# Patient Record
Sex: Female | Born: 1950
Health system: Southern US, Community
[De-identification: ages and names within clinical notes are randomized; demographics above are authoritative.]

## PROBLEM LIST (undated history)

## (undated) DIAGNOSIS — D219 Benign neoplasm of connective and other soft tissue, unspecified: Secondary | ICD-10-CM

## (undated) DIAGNOSIS — N289 Disorder of kidney and ureter, unspecified: Secondary | ICD-10-CM

## (undated) DIAGNOSIS — I509 Heart failure, unspecified: Secondary | ICD-10-CM

## (undated) DIAGNOSIS — K219 Gastro-esophageal reflux disease without esophagitis: Secondary | ICD-10-CM

## (undated) DIAGNOSIS — D649 Anemia, unspecified: Secondary | ICD-10-CM

## (undated) DIAGNOSIS — M329 Systemic lupus erythematosus, unspecified: Secondary | ICD-10-CM

## (undated) DIAGNOSIS — I10 Essential (primary) hypertension: Secondary | ICD-10-CM

## (undated) DIAGNOSIS — M1990 Unspecified osteoarthritis, unspecified site: Secondary | ICD-10-CM

## (undated) DIAGNOSIS — I219 Acute myocardial infarction, unspecified: Secondary | ICD-10-CM

## (undated) DIAGNOSIS — K635 Polyp of colon: Secondary | ICD-10-CM

## (undated) DIAGNOSIS — J189 Pneumonia, unspecified organism: Secondary | ICD-10-CM

## (undated) HISTORY — DX: Polyp of colon: K63.5

## (undated) HISTORY — PX: TONSILLECTOMY: SUR1361

## (undated) HISTORY — DX: Acute myocardial infarction, unspecified: I21.9

---

## 2000-04-16 ENCOUNTER — Emergency Department (HOSPITAL_COMMUNITY): Admission: EM | Admit: 2000-04-16 | Discharge: 2000-04-16 | Payer: Self-pay | Admitting: *Deleted

## 2000-04-22 ENCOUNTER — Other Ambulatory Visit: Admission: RE | Admit: 2000-04-22 | Discharge: 2000-04-22 | Payer: Self-pay | Admitting: Gynecology

## 2000-08-12 ENCOUNTER — Ambulatory Visit: Admission: RE | Admit: 2000-08-12 | Discharge: 2000-08-12 | Payer: Self-pay | Admitting: Gynecology

## 2000-08-31 ENCOUNTER — Encounter: Admission: RE | Admit: 2000-08-31 | Discharge: 2000-08-31 | Payer: Self-pay | Admitting: *Deleted

## 2000-10-27 ENCOUNTER — Inpatient Hospital Stay (HOSPITAL_COMMUNITY): Admission: RE | Admit: 2000-10-27 | Discharge: 2000-10-28 | Payer: Self-pay | Admitting: Gynecology

## 2000-10-27 HISTORY — PX: ABDOMINAL HYSTERECTOMY: SHX81

## 2002-11-30 ENCOUNTER — Emergency Department (HOSPITAL_COMMUNITY): Admission: EM | Admit: 2002-11-30 | Discharge: 2002-11-30 | Payer: Self-pay | Admitting: Emergency Medicine

## 2002-12-03 ENCOUNTER — Encounter: Payer: Self-pay | Admitting: Emergency Medicine

## 2002-12-03 ENCOUNTER — Inpatient Hospital Stay (HOSPITAL_COMMUNITY): Admission: EM | Admit: 2002-12-03 | Discharge: 2002-12-08 | Payer: Self-pay | Admitting: Emergency Medicine

## 2002-12-04 ENCOUNTER — Encounter: Payer: Self-pay | Admitting: Internal Medicine

## 2002-12-08 ENCOUNTER — Encounter: Payer: Self-pay | Admitting: Internal Medicine

## 2002-12-15 ENCOUNTER — Encounter: Payer: Self-pay | Admitting: Internal Medicine

## 2002-12-15 ENCOUNTER — Encounter: Admission: RE | Admit: 2002-12-15 | Discharge: 2002-12-15 | Payer: Self-pay | Admitting: Internal Medicine

## 2002-12-15 ENCOUNTER — Ambulatory Visit (HOSPITAL_COMMUNITY): Admission: RE | Admit: 2002-12-15 | Discharge: 2002-12-15 | Payer: Self-pay | Admitting: Internal Medicine

## 2010-08-03 ENCOUNTER — Emergency Department (HOSPITAL_COMMUNITY): Payer: Self-pay

## 2010-08-03 ENCOUNTER — Inpatient Hospital Stay (HOSPITAL_COMMUNITY)
Admission: EM | Admit: 2010-08-03 | Discharge: 2010-08-08 | DRG: 292 | Disposition: A | Discharge status: Home / Self Care | Payer: Self-pay | Attending: Internal Medicine | Admitting: Internal Medicine

## 2010-08-03 DIAGNOSIS — I509 Heart failure, unspecified: Secondary | ICD-10-CM | POA: Diagnosis present

## 2010-08-03 DIAGNOSIS — I4729 Other ventricular tachycardia: Secondary | ICD-10-CM | POA: Diagnosis not present

## 2010-08-03 DIAGNOSIS — M109 Gout, unspecified: Secondary | ICD-10-CM | POA: Diagnosis present

## 2010-08-03 DIAGNOSIS — Z9119 Patient's noncompliance with other medical treatment and regimen: Secondary | ICD-10-CM

## 2010-08-03 DIAGNOSIS — N179 Acute kidney failure, unspecified: Secondary | ICD-10-CM | POA: Diagnosis present

## 2010-08-03 DIAGNOSIS — Z91199 Patient's noncompliance with other medical treatment and regimen due to unspecified reason: Secondary | ICD-10-CM

## 2010-08-03 DIAGNOSIS — M329 Systemic lupus erythematosus, unspecified: Secondary | ICD-10-CM | POA: Diagnosis present

## 2010-08-03 DIAGNOSIS — I2789 Other specified pulmonary heart diseases: Secondary | ICD-10-CM | POA: Diagnosis present

## 2010-08-03 DIAGNOSIS — I129 Hypertensive chronic kidney disease with stage 1 through stage 4 chronic kidney disease, or unspecified chronic kidney disease: Secondary | ICD-10-CM | POA: Diagnosis present

## 2010-08-03 DIAGNOSIS — N183 Chronic kidney disease, stage 3 unspecified: Secondary | ICD-10-CM | POA: Diagnosis present

## 2010-08-03 DIAGNOSIS — I5031 Acute diastolic (congestive) heart failure: Principal | ICD-10-CM | POA: Diagnosis present

## 2010-08-03 DIAGNOSIS — I472 Ventricular tachycardia, unspecified: Secondary | ICD-10-CM | POA: Diagnosis not present

## 2010-08-03 LAB — COMPREHENSIVE METABOLIC PANEL
CO2: 23 mEq/L (ref 19–32)
Calcium: 8.9 mg/dL (ref 8.4–10.5)
Creatinine, Ser: 2.37 mg/dL — ABNORMAL HIGH (ref 0.4–1.2)
GFR calc non Af Amer: 21 mL/min — ABNORMAL LOW (ref >60)
Glucose, Bld: 131 mg/dL — ABNORMAL HIGH (ref 70–99)

## 2010-08-03 LAB — OCCULT BLOOD, POC DEVICE: Fecal Occult Bld: NEGATIVE

## 2010-08-03 LAB — BRAIN NATRIURETIC PEPTIDE: Pro B Natriuretic peptide (BNP): 1740 pg/mL — ABNORMAL HIGH (ref 0.0–100.0)

## 2010-08-03 LAB — CBC
HCT: 40 % (ref 36.0–46.0)
Hemoglobin: 12.5 g/dL (ref 12.0–15.0)
MCH: 26 pg (ref 26.0–34.0)
MCHC: 31.3 g/dL (ref 30.0–36.0)

## 2010-08-03 LAB — DIFFERENTIAL
Basophils Relative: 1 % (ref 0–1)
Eosinophils Relative: 1 % (ref 0–5)
Lymphocytes Relative: 16 % (ref 12–46)
Monocytes Absolute: 0.6 10*3/uL (ref 0.1–1.0)
Monocytes Relative: 6 % (ref 3–12)
Neutro Abs: 7.2 10*3/uL (ref 1.7–7.7)

## 2010-08-03 LAB — APTT: aPTT: 29 seconds (ref 24–37)

## 2010-08-03 LAB — LIPASE, BLOOD: Lipase: 22 U/L (ref 11–59)

## 2010-08-04 ENCOUNTER — Inpatient Hospital Stay (HOSPITAL_COMMUNITY): Payer: Self-pay

## 2010-08-04 DIAGNOSIS — I509 Heart failure, unspecified: Secondary | ICD-10-CM

## 2010-08-04 DIAGNOSIS — M7989 Other specified soft tissue disorders: Secondary | ICD-10-CM

## 2010-08-04 LAB — URINE MICROSCOPIC-ADD ON

## 2010-08-04 LAB — LIPID PANEL
Cholesterol: 127 mg/dL (ref 0–200)
HDL: 33 mg/dL — ABNORMAL LOW (ref >39)
Triglycerides: 63 mg/dL (ref <150)

## 2010-08-04 LAB — URINALYSIS, ROUTINE W REFLEX MICROSCOPIC
Glucose, UA: NEGATIVE mg/dL
Ketones, ur: NEGATIVE mg/dL
pH: 5.5 (ref 5.0–8.0)

## 2010-08-04 LAB — CBC
Hemoglobin: 11.1 g/dL — ABNORMAL LOW (ref 12.0–15.0)
MCH: 25.2 pg — ABNORMAL LOW (ref 26.0–34.0)
MCV: 84.1 fL (ref 78.0–100.0)
RBC: 4.41 MIL/uL (ref 3.87–5.11)

## 2010-08-04 LAB — BASIC METABOLIC PANEL
CO2: 30 mEq/L (ref 19–32)
Chloride: 107 mEq/L (ref 96–112)
Glucose, Bld: 88 mg/dL (ref 70–99)
Potassium: 3.7 mEq/L (ref 3.5–5.1)
Sodium: 144 mEq/L (ref 135–145)

## 2010-08-04 LAB — TROPONIN I: Troponin I: 0.07 ng/mL — ABNORMAL HIGH (ref 0.00–0.06)

## 2010-08-04 LAB — CK TOTAL AND CKMB (NOT AT ARMC)
Relative Index: INVALID (ref 0.0–2.5)
Total CK: 65 U/L (ref 7–177)

## 2010-08-04 MED ORDER — TECHNETIUM TO 99M ALBUMIN AGGREGATED
4.6000 | Freq: Once | INTRAVENOUS | Status: AC | PRN
  Administered 2010-08-04: 4.6 via INTRAVENOUS

## 2010-08-04 MED ORDER — XENON XE 133 GAS
6.7000 | GAS_FOR_INHALATION | Freq: Once | RESPIRATORY_TRACT | Status: AC | PRN
  Administered 2010-08-04: 6.7 via RESPIRATORY_TRACT

## 2010-08-05 LAB — URINE CULTURE
Colony Count: NO GROWTH
Culture  Setup Time: 201203260900
Special Requests: NEGATIVE

## 2010-08-05 LAB — BASIC METABOLIC PANEL
BUN: 20 mg/dL (ref 6–23)
Chloride: 104 mEq/L (ref 96–112)
Creatinine, Ser: 2.53 mg/dL — ABNORMAL HIGH (ref 0.4–1.2)

## 2010-08-05 LAB — CARDIAC PANEL(CRET KIN+CKTOT+MB+TROPI)
CK, MB: 1.3 ng/mL (ref 0.3–4.0)
Relative Index: INVALID (ref 0.0–2.5)
Total CK: 52 U/L (ref 7–177)

## 2010-08-05 LAB — JO-1 ANTIBODY-IGG: Jo-1 Antibody, IgG: 2 AU/mL (ref <30)

## 2010-08-05 LAB — MAGNESIUM: Magnesium: 2.2 mg/dL (ref 1.5–2.5)

## 2010-08-05 LAB — EXTRACTABLE NUCLEAR ANTIGEN ANTIBODY
SSB (La) (ENA) Antibody, IgG: <1 AU/mL (ref <30)
Sm/rnp: 2 AU/mL (ref <30)
ds DNA Ab: 138 IU/mL — ABNORMAL HIGH (ref <30)

## 2010-08-05 LAB — POCT CARDIAC MARKERS: Troponin i, poc: <0.05 ng/mL (ref 0.00–0.09)

## 2010-08-05 LAB — BRAIN NATRIURETIC PEPTIDE: Pro B Natriuretic peptide (BNP): 846 pg/mL — ABNORMAL HIGH (ref 0.0–100.0)

## 2010-08-06 DIAGNOSIS — I5031 Acute diastolic (congestive) heart failure: Secondary | ICD-10-CM

## 2010-08-06 LAB — BASIC METABOLIC PANEL
Calcium: 8.8 mg/dL (ref 8.4–10.5)
GFR calc Af Amer: 25 mL/min — ABNORMAL LOW (ref >60)
GFR calc non Af Amer: 21 mL/min — ABNORMAL LOW (ref >60)
Sodium: 142 mEq/L (ref 135–145)

## 2010-08-06 LAB — COMPLEMENT, TOTAL: Compl, Total (CH50): >60 U/mL — ABNORMAL HIGH (ref 31–60)

## 2010-08-06 LAB — BRAIN NATRIURETIC PEPTIDE: Pro B Natriuretic peptide (BNP): 491 pg/mL — ABNORMAL HIGH (ref 0.0–100.0)

## 2010-08-07 LAB — BASIC METABOLIC PANEL
BUN: 17 mg/dL (ref 6–23)
Calcium: 9 mg/dL (ref 8.4–10.5)
Creatinine, Ser: 2.04 mg/dL — ABNORMAL HIGH (ref 0.4–1.2)
GFR calc Af Amer: 30 mL/min — ABNORMAL LOW (ref >60)

## 2010-08-07 LAB — BRAIN NATRIURETIC PEPTIDE: Pro B Natriuretic peptide (BNP): 698 pg/mL — ABNORMAL HIGH (ref 0.0–100.0)

## 2010-08-08 LAB — BRAIN NATRIURETIC PEPTIDE: Pro B Natriuretic peptide (BNP): 596 pg/mL — ABNORMAL HIGH (ref 0.0–100.0)

## 2010-08-08 LAB — BASIC METABOLIC PANEL
BUN: 15 mg/dL (ref 6–23)
Calcium: 9.2 mg/dL (ref 8.4–10.5)
Creatinine, Ser: 1.9 mg/dL — ABNORMAL HIGH (ref 0.4–1.2)
GFR calc Af Amer: 33 mL/min — ABNORMAL LOW (ref >60)

## 2010-08-11 NOTE — H&P (Signed)
NAMEAMELIAH, [name], MD       DATE OF BIRTH:  1951/04/04  DATE OF ADMISSION:  08/03/2010 DATE OF DISCHARGE:                             HISTORY & PHYSICAL   PRIMARY CARE PHYSICIAN:  Unassigned.  The patient is followed by the [facility], but does not have 1 physician she usually sees.  CHIEF COMPLAINT:  Shortness of breath.  HISTORY OF PRESENT ILLNESS:  A 60 year old [place] female with a history of lupus, in remission for 20 years.  She also has a history of hypertension and had been doing reasonably well until she began noticing abdominal swelling, dyspnea on exertion, shortness of breath, orthopnea, and PND.  These symptoms have been going on for about 2 months, but had gotten much worse over the last few weeks and today she just could not take it no more, started having some abdominal pain because of the distention.  She denied any chest pain, any palpitations.  She did complain of shortness of breath, but that was more associated with cough.  She would cough a lot and then get nauseated and had a couple episodes of emesis.  Usually, the cough was clear and the emesis was just yellow bowel or food products.  She denied any hematemesis, blood in the stools or black stools.  She denied any fever or chills.  She denied any diarrhea.  She did complain of discomfort in abdomen, but not necessarily abdominal pain.  PAST MEDICAL HISTORY:  Significant for 1. Hypertension. 2. Lupus, diagnosed by renal biopsy in 1970s and followed by [facility] and     release has been in remission for more than 20 years. 3. Fibroids, for which she had a hysterectomy. 4. Gout.  MEDICATIONS:  The patient is currently not taking any medicines.  ALLERGIES:  She has no known drug allergies.  FAMILY HISTORY:   Significant for hypertension, renal failure, breast cancer, and colon cancer.  SOCIAL HISTORY:  She is single, retired Immunologist, smokes. Denies tobacco, drug, or alcohol use.  REVIEW OF SYSTEMS:  As per history of present illness.  All systems were reviewed.  PHYSICAL EXAMINATION:  VITAL SIGNS:  Temperature is 98.4, blood pressure 193/120, pulse 92, respiration is 20, pulse ox 94 on 2 L.  The patient was given Lasix and nitroglycerin patch, blood pressure post that is pending. HEENT:  Her head is atraumatic and normocephalic.  EYES:  Pupils equal, round, and reactive to light.  Disks sharp.  Extraocular muscle range of motion is full.  Sclerae anicteric.  EAR, NOSE, AND THROAT:  Mucous membranes are moist.  No lesions or erythema noted. NECK:  Supple without jugular venous distention, thyromegaly, or thyroid mass. CHEST:  Nontender. LUNGS:  Clear to auscultation, but there are decreased breath sounds in the bases. ABDOMEN:  Distended.  She is uncomfortable because of distention, but she is nontender.  She has positive bowel sounds.  No palpable masses appreciated. RECTAL:  There are no masses in the rectal vault.  Stool appears brown in color.  The stool guaiac card was sent, I do not have the results back. EXTREMITIES:  There is +1 pitting edema.  She said her legs swell up, but she said if she keeps them up, it goes down. BREASTS:  There are no palpable masses.  There is no cervical, axillary, or inguinal lymphadenopathy. PELVIC:  Deferred at the patient's request. NEUROLOGIC:  Awake, alert, oriented x4.  Cranial nerves II-XII intact. Motor strength 5/5 in all extremities.  Sensory intact to fine touch and pinprick.  LABORATORY DATA:  Her CBC shows a white count of 9400, hemoglobin of 12.5, hematocrit 40.  Differential is normal.  Comprehensive metabolic panel shows potassium to be low at 3.3 mEq, glucose is elevated at 131, BUN is normal, creatinine is elevated at  2.37, and the remainder of her CMP appears normal except for a low albumin of 3.1.  Her BMP was done and lipase was normal at 22.  BNP was 1540 pcg/mL.  Chest x-ray shows cardiomegaly and bilateral pleural effusions.  Abdominal series shows nonobstructive pattern.  Cardiac profiles were sent and are pending.  IMPRESSION: 1. Congestive heart failure, new onset, etiology uncertain. 2. Acute renal failure, probably acute on chronic. 3. Hypertension, out of control. 4. History of lupus, in remission for 20 years, but is concerning in     this scenario. 5. Ascites. 6. Pleural effusions. 7. Lower extremity edema.  PLAN:  Admit the patient to telemetry, obtain an echocardiogram, obtain cardiology consult.  Obtain a lupus panel, a lipid profile.  Check on cardiac markers.  Treat her hypertension and monitor her blood pressure, follow CHF guidelines; however, I will not put her on lisinopril or an ACE inhibitor because of her acute renal failure and further evaluation and treatment as indicated by hospital course.  The patient is being admitted to [facility].     Erma Pinto, MD     ML/MEDQ  D:  08/03/2010  T:  08/04/2010  Job:  [id number]  Electronically Signed by Erma Pinto  on 08/11/2010 11:10:56 AM

## 2010-08-11 NOTE — Discharge Summary (Signed)
Brittany Charles, POEPPELMAN            ACCOUNT NO.:  [id number]  MEDICAL RECORD NO.:  [id number]           PATIENT TYPE:  I  LOCATION:  [id number]                         [facility]ACILITY:  [facility]  PHYSICIAN:  Annita Brod, M.D.DATE OF BIRTH:  1951-03-15  DATE OF ADMISSION:  08/03/2010 DATE OF DISCHARGE:  08/08/2010                              DISCHARGE SUMMARY   PRIMARY CARE PHYSICIAN:  The patient does not have a established primary care physician, but she has been provided with a number of primary care physician.  Her cardiologist who established this admission will follow her as outpatient Dr. Kirk Ruths of [facility].  DISCHARGE DIAGNOSES: 1. Hypertensive urgency, uncontrolled. 2. Acute diastolic congestive heart failure. 3. History of chronic kidney disease, stage 3, now back to baseline. 4. History of lupus. 5. History of medical noncompliance. 6. History of gout. 7. Pulmonary hypertension  All medical conditions were present on admission.  DISCHARGE MEDICATIONS: 1. Norvasc 5 p.o. b.i.d. 2. Coreg 25 p.o. b.i.d. with meals. 3. Lasix 40 p.o. b.i.d. 4. Hydralazine 50 p.o. b.i.d. 5. Imdur 30 mg p.o. daily. 6. K-Dur 40 mEq p.o. b.i.d.  HOSPITAL COURSE:  The patient is a 60 year old African-American female with past medical history of hypertension and lupus who has not been on any medications at all for the past few years.  She came in complaining of some increasing shortness breath that has been going on for several weeks but has progressed.  She is noticing some increased abdominal distention and lower extremity edema.  She came in with a blood pressure noted to be 193/120 and BNP of 1740.  She was given 80 mg IV Lasix with significant diuresis and much improvement in her symptoms.  A 2-D echo was ordered on this patient which noted to have a preserved ejection fraction of 50% to 55%, moderate tricuspid regurg and pulmonary artery hypertension.  Over the next  several days, the patient was aggressively diuresed as well as blood pressure medications were constantly adjusted to account for her renal failure as well as for her aggressive blood pressure control.  By August 08, 2010, the patient was doing much better. Her BNP had come down to 596.  However, creatinine was also down to 1.9. At this point, her Lasix was increased to twice a day.  Her Coreg and Norvasc were increased as well which she has tolerated.  Her blood pressure at time of discharge was noted to be 108/63 with a heart rate of 80 and she was satting 96% on room air and comfortable.  Plan will be for the patient to be discharged home.  DISPOSITION:  Improved.  ACTIVITY:  Slowly increase.  DISCHARGE DIET:  Heart-healthy diet.  FOLLOWUP:  She will follow up with Dr. Stanford Breed in next 2 weeks.  I have given her 3 different primary medicine practices for her to help choose from for a primary physician.     Annita Brod, M.D.     SKK/MEDQ  D:  08/08/2010  T:  08/08/2010  Job:  [id number]  cc:   Denice Bors. Stanford Breed, MD, [place]. [address]  [address]  [place]  Electronically Signed by Gevena Barre M.D. on 08/11/2010 02:37:49 PM

## 2010-09-10 NOTE — Consult Note (Signed)
Brittany Charles, Brittany Charles            ACCOUNT NO.:  [id number]  MEDICAL RECORD NO.:  [id number]           PATIENT TYPE:  I  LOCATION:  [id number]                         FACILITY:  [facility]  PHYSICIAN:  Denice Bors. Stanford Breed, MD, FACCDATE OF BIRTH:  1951/04/25  DATE OF CONSULTATION: DATE OF DISCHARGE:                                CONSULTATION   PRIMARY CARDIOLOGIST:  New patient to [facility], currently being evaluated by Dr. Stanford Breed.  PRIMARY CARE PROVIDER:  Currently, the patient does not have one.  REFERRING PHYSICIAN:  Erma Pinto, MD  REASON FOR CONSULTATION:  Congestive heart failure.  HISTORY OF PRESENT ILLNESS:  This is a 60 year old African-American female without known coronary artery disease with a history of lupus that has been in remission for 20 years as well as hypertension and renal insufficiency, who presents with abdominal distention, abdominal pain and shortness of breath.  The patient states her symptoms started several months ago and she noticed increased shortness of breath and felt that it was secondary to her allergies.  Her symptoms have progressively worsened over the past several weeks.  At this time, she noticed increased abdominal distention, lower extremity edema, dyspnea on exertion as well as orthopnea and PND.  The patient's symptoms have lasted several days, has been coughing.  The patient to be able to walk only several feet.  She states even moving from chair to chair is difficult, as she is very dyspneic.  With her symptoms worse, she presented to the Emergency Department for further followup.  Initially in the Emergency Room, the patient's blood pressure is noted to be 193/120.  She was given clonidine and her pressures are under better control.  She was noted to have a BNP of 1740 and has been given 80 mg of IV Lasix with much improvement in her symptoms.  The patient is still mildly dyspneic with conversation but states she is much improved.   The patient denies any chest pain, fever, chills or change in bowel habits. She also states that she does have a cough that is worse when lying supine, this is nonproductive.  She will cough enough to make her nauseous and vomit.  The patient has been admitted by the [facility] at  [facility].  She is currently being treated with IV Lasix as well as hydralazine for her blood pressure.  Echo is pending at this time.  Cardiology was consulted for further evaluation.  PAST MEDICAL HISTORY: 1. Lupus, has been in remission for 20 years, was followed at [facility]. 2. Hypertension. 3. Renal insufficiency. 4. Fibroid status post hysterectomy. 5. Gout. 6. Status post tonsillectomy.  SOCIAL HISTORY:  The patient lives in [place].  She is a retired Immunologist.  She is single and has 2 children.  She denies any tobacco, alcohol or illicit drug use.  FAMILY HISTORY:  Noncontributory for early coronary artery disease. Mother did have congestive heart failure.  Further family history is positive for hypertension, diabetes mellitus, end-stage renal disease as well as breast cancer.  ALLERGIES:  ZITHROMAX causing nausea and vomiting.  HOME MEDICATIONS:  None.  INPATIENT MEDICATIONS: 1. Coreg 6.25 mg twice daily. 2. Lovenox 40 mg subcutaneously daily. 3. Lasix 40 mg IV twice daily. 4. Hydralazine 50 mg q.6h.  REVIEW OF SYSTEMS:  All pertinent positives and negatives as stated in HPI.  All other systems have been reviewed and are negative.  PHYSICAL EXAMINATION:  VITAL SIGNS:  Temperature 97.4, pulse 65, respirations 18, blood pressure 145/87, O2 saturation 97% on 2 liters. In's and out's negative at 698 over 24 hours.  Weight 91.3 kg (down from 92.1 kg on admission). GENERAL:  This is a well-developed, well-nourished middle-aged female. She is mildly dyspneic during conversation but no acute distress. HEENT:  Normal. NECK:  Supple without bruit.   Minimal JVD noted. HEART:  Distant sounds with S1 and S2 as well as an S3 noted.  No murmur, rub or gallop.  PMI is normal.  Pulses are 2+ and equal bilaterally. LUNGS:  Clear to auscultation bilaterally.  Decreased breath sounds at the bases as well as minimal expiratory wheezing. ABDOMEN:  Soft with mild distention, diffusely tender throughout. Positive bowel sounds x 4.  Negative hepatosplenomegaly. EXTREMITIES:  1+ bilateral lower extremity edema.  No clubbing, cyanosis. MUSCULOSKELETAL:  No joint deformities or effusions. NEUROLOGIC:  Alert and oriented x 3.  Cranial nerves II through XII grossly intact.  LABORATORY AND RADIOLOGIC DATA:  Chest x-ray showing cardiomegaly with bilateral pleural changes.  X-ray of the abdomen demonstrating nonobstructive bowel gas pattern.  Echo pending.  EKG showing normal sinus rhythm at a rate of 88 beats per minute.  There are lateral T-wave inversions.  Axis is normal.  There are no prior tracings for comparison.  Labs, WBC of 9.4, hemoglobin 12.5, hematocrit 40, platelets 294,000.  Sodium 139, potassium 3.3, chloride 108, bicarb 23, BUN 19, creatinine 2.37.  Troponin I is 0.08, 0.07.  CK and CK-MB within normal limits.  ASSESSMENT AND PLAN:  This is a 60 year old African-American female with history of hypertension, lupus and renal insufficiency, who presented for evaluation of congestive heart failure.  The patient's initial BNP was elevated at 1740.  Her BUN/creatinine is 19/2.37.  She does have an EKG with normal sinus rhythm and lateral T-wave inversions.  The patient does show clinical improvement after IV diuresis.  Congestive heart failure, new onset:  Question if the systolic dysfunction is secondary to hypertension versus diastolic dysfunction. Agree with echo, this is pending.  Also agree with continued diuresis and following renal function closely.  Please continue Coreg as well as hydralazine.  We will add nitrates to the  patient's current regimen.  We will advance medications as tolerated to control blood pressure.  There may be a component of the patient's congestive heart failure, volume excess secondary to her renal insufficiency.  We would suggest Nephrology consult at this time.  Agree with no ACE inhibitor secondary to renal insufficiency.  We will then evaluate the patient further with an outpatient Myoview when her congestive heart failure is better.  Thank you for this evaluation.  We will continue to follow along with you.     Cecille Amsterdam, PA-C   ______________________________ Denice Bors. Stanford Breed, MD, [facility]    NB/MEDQ  D:  08/04/2010  T:  08/04/2010  Job:  [id number]  Electronically Signed by Pennie Rushing P.A. on 09/10/2010 02:52:52 PM Electronically Signed by Kirk Ruths MD [facility] on 09/10/2010 07:39:58 PM

## 2011-01-29 ENCOUNTER — Emergency Department (HOSPITAL_COMMUNITY)
Admission: EM | Admit: 2011-01-29 | Discharge: 2011-01-29 | Disposition: A | Discharge status: Home / Self Care | Payer: Medicaid Other | Attending: Emergency Medicine | Admitting: Emergency Medicine

## 2011-01-29 DIAGNOSIS — Z87898 Personal history of other specified conditions: Secondary | ICD-10-CM | POA: Insufficient documentation

## 2011-01-29 DIAGNOSIS — I10 Essential (primary) hypertension: Secondary | ICD-10-CM | POA: Insufficient documentation

## 2011-01-29 DIAGNOSIS — R040 Epistaxis: Secondary | ICD-10-CM | POA: Insufficient documentation

## 2011-01-29 DIAGNOSIS — M109 Gout, unspecified: Secondary | ICD-10-CM | POA: Insufficient documentation

## 2011-01-29 LAB — DIFFERENTIAL
Eosinophils Absolute: 0.2 10*3/uL (ref 0.0–0.7)
Eosinophils Relative: 3 % (ref 0–5)
Lymphocytes Relative: 26 % (ref 12–46)
Lymphs Abs: 1.8 10*3/uL (ref 0.7–4.0)
Monocytes Absolute: 0.4 10*3/uL (ref 0.1–1.0)

## 2011-01-29 LAB — POCT I-STAT, CHEM 8
Calcium, Ion: 1.16 mmol/L (ref 1.12–1.32)
Creatinine, Ser: 1.9 mg/dL — ABNORMAL HIGH (ref 0.50–1.10)
Hemoglobin: 12.2 g/dL (ref 12.0–15.0)
Sodium: 144 mEq/L (ref 135–145)
TCO2: 22 mmol/L (ref 0–100)

## 2011-01-29 LAB — CBC
HCT: 36.4 % (ref 36.0–46.0)
MCH: 28.1 pg (ref 26.0–34.0)
MCHC: 32.1 g/dL (ref 30.0–36.0)
MCV: 87.5 fL (ref 78.0–100.0)
Platelets: 254 10*3/uL (ref 150–400)
RDW: 13.7 % (ref 11.5–15.5)
WBC: 6.9 10*3/uL (ref 4.0–10.5)

## 2011-01-29 LAB — SAMPLE TO BLOOD BANK

## 2011-04-16 ENCOUNTER — Emergency Department (HOSPITAL_COMMUNITY): Payer: Medicaid Other

## 2011-04-16 ENCOUNTER — Encounter: Payer: Self-pay | Admitting: *Deleted

## 2011-04-16 ENCOUNTER — Inpatient Hospital Stay (HOSPITAL_COMMUNITY)
Admission: EM | Admit: 2011-04-16 | Discharge: 2011-04-22 | DRG: 286 | Disposition: A | Discharge status: Home / Self Care | Payer: Medicaid Other | Attending: Internal Medicine | Admitting: Internal Medicine

## 2011-04-16 ENCOUNTER — Ambulatory Visit (INDEPENDENT_AMBULATORY_CARE_PROVIDER_SITE_OTHER): Payer: 59

## 2011-04-16 DIAGNOSIS — I5033 Acute on chronic diastolic (congestive) heart failure: Principal | ICD-10-CM | POA: Diagnosis present

## 2011-04-16 DIAGNOSIS — N179 Acute kidney failure, unspecified: Secondary | ICD-10-CM | POA: Diagnosis present

## 2011-04-16 DIAGNOSIS — I129 Hypertensive chronic kidney disease with stage 1 through stage 4 chronic kidney disease, or unspecified chronic kidney disease: Secondary | ICD-10-CM | POA: Diagnosis present

## 2011-04-16 DIAGNOSIS — J189 Pneumonia, unspecified organism: Secondary | ICD-10-CM

## 2011-04-16 DIAGNOSIS — R059 Cough, unspecified: Secondary | ICD-10-CM | POA: Diagnosis present

## 2011-04-16 DIAGNOSIS — R7309 Other abnormal glucose: Secondary | ICD-10-CM | POA: Diagnosis not present

## 2011-04-16 DIAGNOSIS — M129 Arthropathy, unspecified: Secondary | ICD-10-CM | POA: Diagnosis present

## 2011-04-16 DIAGNOSIS — M109 Gout, unspecified: Secondary | ICD-10-CM | POA: Diagnosis present

## 2011-04-16 DIAGNOSIS — N183 Chronic kidney disease, stage 3 unspecified: Secondary | ICD-10-CM | POA: Diagnosis present

## 2011-04-16 DIAGNOSIS — I10 Essential (primary) hypertension: Secondary | ICD-10-CM | POA: Diagnosis present

## 2011-04-16 DIAGNOSIS — I2510 Atherosclerotic heart disease of native coronary artery without angina pectoris: Secondary | ICD-10-CM | POA: Diagnosis present

## 2011-04-16 DIAGNOSIS — R0602 Shortness of breath: Secondary | ICD-10-CM | POA: Diagnosis present

## 2011-04-16 DIAGNOSIS — R079 Chest pain, unspecified: Secondary | ICD-10-CM | POA: Diagnosis present

## 2011-04-16 DIAGNOSIS — M329 Systemic lupus erythematosus, unspecified: Secondary | ICD-10-CM | POA: Diagnosis present

## 2011-04-16 DIAGNOSIS — N185 Chronic kidney disease, stage 5: Secondary | ICD-10-CM | POA: Diagnosis present

## 2011-04-16 DIAGNOSIS — R0902 Hypoxemia: Secondary | ICD-10-CM

## 2011-04-16 DIAGNOSIS — J069 Acute upper respiratory infection, unspecified: Secondary | ICD-10-CM | POA: Diagnosis present

## 2011-04-16 DIAGNOSIS — Z7982 Long term (current) use of aspirin: Secondary | ICD-10-CM

## 2011-04-16 DIAGNOSIS — E876 Hypokalemia: Secondary | ICD-10-CM | POA: Diagnosis present

## 2011-04-16 DIAGNOSIS — R05 Cough: Secondary | ICD-10-CM | POA: Diagnosis present

## 2011-04-16 DIAGNOSIS — K5289 Other specified noninfective gastroenteritis and colitis: Secondary | ICD-10-CM | POA: Diagnosis present

## 2011-04-16 DIAGNOSIS — I509 Heart failure, unspecified: Secondary | ICD-10-CM | POA: Diagnosis present

## 2011-04-16 HISTORY — DX: Essential (primary) hypertension: I10

## 2011-04-16 HISTORY — DX: Disorder of kidney and ureter, unspecified: N28.9

## 2011-04-16 HISTORY — DX: Systemic lupus erythematosus, unspecified: M32.9

## 2011-04-16 HISTORY — DX: Heart failure, unspecified: I50.9

## 2011-04-16 HISTORY — DX: Unspecified osteoarthritis, unspecified site: M19.90

## 2011-04-16 HISTORY — DX: Benign neoplasm of connective and other soft tissue, unspecified: D21.9

## 2011-04-16 MED ORDER — IPRATROPIUM BROMIDE 0.02 % IN SOLN
0.5000 mg | Freq: Once | RESPIRATORY_TRACT | Status: AC
Start: 2011-04-16 — End: 2011-04-17
  Administered 2011-04-17: 0.5 mg via RESPIRATORY_TRACT
  Filled 2011-04-16: qty 2.5

## 2011-04-16 MED ORDER — ALBUTEROL SULFATE (5 MG/ML) 0.5% IN NEBU
5.0000 mg | INHALATION_SOLUTION | Freq: Once | RESPIRATORY_TRACT | Status: AC
Start: 2011-04-16 — End: 2011-04-17
  Administered 2011-04-17: 5 mg via RESPIRATORY_TRACT
  Filled 2011-04-16: qty 1

## 2011-04-16 NOTE — ED Notes (Signed)
Pt here via EMS from urgent care. ([facility] With a dx of PNA. Confirmed by xray at urgent care. Presented to urgent care with prod cough, sob. IV placed at urgent care # 22 gauge.

## 2011-04-16 NOTE — ED Notes (Signed)
Pt here from urgent care where she presented there with sob and prod cough.ems was called from urgent care to transport to ER. On arrival. Pt receiving albuterol 5 mg placed by ems. Pt breathing slightly labored and shallow. IV on arrival. NAD at this time. Will monitor. Pt placed on monitor and pulsox.

## 2011-04-16 NOTE — ED Notes (Signed)
[id number] Expected date:04/16/11<BR> Expected time: 9:51 PM<BR> Means of arrival:Ambulance<BR> Comments:<BR> EMS [facility] - [place] transfer/pneumonia

## 2011-04-16 NOTE — ED Notes (Signed)
5 albuterol given EMS.

## 2011-04-17 ENCOUNTER — Other Ambulatory Visit: Payer: Self-pay

## 2011-04-17 ENCOUNTER — Encounter (HOSPITAL_COMMUNITY): Payer: Self-pay | Admitting: Family Medicine

## 2011-04-17 ENCOUNTER — Emergency Department (HOSPITAL_COMMUNITY): Payer: Medicaid Other

## 2011-04-17 DIAGNOSIS — I509 Heart failure, unspecified: Secondary | ICD-10-CM

## 2011-04-17 DIAGNOSIS — I10 Essential (primary) hypertension: Secondary | ICD-10-CM | POA: Diagnosis present

## 2011-04-17 DIAGNOSIS — M109 Gout, unspecified: Secondary | ICD-10-CM | POA: Diagnosis present

## 2011-04-17 DIAGNOSIS — M329 Systemic lupus erythematosus, unspecified: Secondary | ICD-10-CM | POA: Insufficient documentation

## 2011-04-17 DIAGNOSIS — R05 Cough: Secondary | ICD-10-CM | POA: Diagnosis present

## 2011-04-17 DIAGNOSIS — R079 Chest pain, unspecified: Secondary | ICD-10-CM | POA: Diagnosis present

## 2011-04-17 LAB — CBC
HCT: 35.8 % — ABNORMAL LOW (ref 36.0–46.0)
HCT: 33.9 % — ABNORMAL LOW (ref 36.0–46.0)
HCT: 32.1 % — ABNORMAL LOW (ref 36.0–46.0)
Hemoglobin: 11.4 g/dL — ABNORMAL LOW (ref 12.0–15.0)
MCH: 25.8 pg — ABNORMAL LOW (ref 26.0–34.0)
MCH: 26.3 pg (ref 26.0–34.0)
MCH: 25.9 pg — ABNORMAL LOW (ref 26.0–34.0)
MCHC: 31.8 g/dL (ref 30.0–36.0)
MCHC: 32.4 g/dL (ref 30.0–36.0)
MCHC: 32.1 g/dL (ref 30.0–36.0)
MCV: 80.9 fL (ref 78.0–100.0)
MCV: 80.7 fL (ref 78.0–100.0)
Platelets: 470 10*3/uL — ABNORMAL HIGH (ref 150–400)
Platelets: 414 10*3/uL — ABNORMAL HIGH (ref 150–400)
RDW: 15.4 % (ref 11.5–15.5)
RDW: 15.4 % (ref 11.5–15.5)
RDW: 15.4 % (ref 11.5–15.5)
WBC: 18 10*3/uL — ABNORMAL HIGH (ref 4.0–10.5)

## 2011-04-17 LAB — LIPID PANEL
HDL: 22 mg/dL — ABNORMAL LOW (ref >39)
Total CHOL/HDL Ratio: 6.2 RATIO
VLDL: 23 mg/dL (ref 0–40)

## 2011-04-17 LAB — DIFFERENTIAL
Basophils Absolute: 0.2 10*3/uL — ABNORMAL HIGH (ref 0.0–0.1)
Basophils Relative: 1 % (ref 0–1)
Eosinophils Absolute: 0 10*3/uL (ref 0.0–0.7)
Lymphocytes Relative: 10 % — ABNORMAL LOW (ref 12–46)
Monocytes Absolute: 1.3 10*3/uL — ABNORMAL HIGH (ref 0.1–1.0)
Neutro Abs: 15.1 10*3/uL — ABNORMAL HIGH (ref 1.7–7.7)
Neutrophils Relative %: 82 % — ABNORMAL HIGH (ref 43–77)

## 2011-04-17 LAB — INFLUENZA PANEL BY PCR (TYPE A & B): H1N1 flu by pcr: NOT DETECTED

## 2011-04-17 LAB — BASIC METABOLIC PANEL
BUN: 15 mg/dL (ref 6–23)
CO2: 22 mEq/L (ref 19–32)
Calcium: 8.9 mg/dL (ref 8.4–10.5)
Chloride: 102 mEq/L (ref 96–112)
Creatinine, Ser: 1.85 mg/dL — ABNORMAL HIGH (ref 0.50–1.10)
Glucose, Bld: 122 mg/dL — ABNORMAL HIGH (ref 70–99)

## 2011-04-17 LAB — POCT I-STAT, CHEM 8
Creatinine, Ser: 1.9 mg/dL — ABNORMAL HIGH (ref 0.50–1.10)
Glucose, Bld: 155 mg/dL — ABNORMAL HIGH (ref 70–99)
HCT: 40 % (ref 36.0–46.0)
Hemoglobin: 13.6 g/dL (ref 12.0–15.0)
Potassium: 3.3 mEq/L — ABNORMAL LOW (ref 3.5–5.1)
TCO2: 25 mmol/L (ref 0–100)

## 2011-04-17 LAB — CARDIAC PANEL(CRET KIN+CKTOT+MB+TROPI)
CK, MB: 1.9 ng/mL (ref 0.3–4.0)
Total CK: 179 U/L — ABNORMAL HIGH (ref 7–177)
Troponin I: <0.3 ng/mL (ref <0.30)

## 2011-04-17 LAB — POCT I-STAT TROPONIN I

## 2011-04-17 MED ORDER — GUAIFENESIN-DM 100-10 MG/5ML PO SYRP
5.0000 mL | ORAL_SOLUTION | ORAL | Status: DC | PRN
  Administered 2011-04-17 – 2011-04-18 (×2): 5 mL via ORAL
  Filled 2011-04-17 (×2): qty 10

## 2011-04-17 MED ORDER — FUROSEMIDE 40 MG PO TABS
40.0000 mg | ORAL_TABLET | Freq: Two times a day (BID) | ORAL | Status: DC
  Administered 2011-04-17 – 2011-04-18 (×3): 40 mg via ORAL
  Filled 2011-04-17 (×6): qty 1

## 2011-04-17 MED ORDER — AZITHROMYCIN 250 MG PO TABS
500.0000 mg | ORAL_TABLET | Freq: Once | ORAL | Status: AC
  Administered 2011-04-17: 500 mg via ORAL
  Filled 2011-04-17: qty 1

## 2011-04-17 MED ORDER — AMLODIPINE BESYLATE 5 MG PO TABS
5.0000 mg | ORAL_TABLET | Freq: Two times a day (BID) | ORAL | Status: DC
  Administered 2011-04-17 – 2011-04-22 (×12): 5 mg via ORAL
  Filled 2011-04-17 (×16): qty 1

## 2011-04-17 MED ORDER — ONDANSETRON HCL 4 MG PO TABS: 4.0000 mg | ORAL_TABLET | Freq: Four times a day (QID) | ORAL | Status: DC | PRN

## 2011-04-17 MED ORDER — ASPIRIN 325 MG PO TABS
325.0000 mg | ORAL_TABLET | Freq: Every day | ORAL | Status: DC
Start: 2011-04-17 — End: 2011-04-22
  Administered 2011-04-17 – 2011-04-22 (×5): 325 mg via ORAL
  Filled 2011-04-17 (×6): qty 1

## 2011-04-17 MED ORDER — POTASSIUM CHLORIDE CRYS ER 20 MEQ PO TBCR
40.0000 meq | EXTENDED_RELEASE_TABLET | Freq: Once | ORAL | Status: AC
  Administered 2011-04-17: 40 meq via ORAL
  Filled 2011-04-17: qty 2

## 2011-04-17 MED ORDER — TECHNETIUM TO 99M ALBUMIN AGGREGATED: 3.1000 | Freq: Once | INTRAVENOUS | Status: AC | PRN

## 2011-04-17 MED ORDER — ENOXAPARIN SODIUM 40 MG/0.4ML ~~LOC~~ SOLN
40.0000 mg | Freq: Every day | SUBCUTANEOUS | Status: DC
  Administered 2011-04-17 – 2011-04-18 (×3): 40 mg via SUBCUTANEOUS
  Filled 2011-04-17 (×5): qty 0.4

## 2011-04-17 MED ORDER — SODIUM CHLORIDE 0.9 % IJ SOLN: 3.0000 mL | INTRAMUSCULAR | Status: DC | PRN

## 2011-04-17 MED ORDER — BENZONATATE 100 MG PO CAPS
100.0000 mg | ORAL_CAPSULE | Freq: Three times a day (TID) | ORAL | Status: DC
  Administered 2011-04-17 – 2011-04-22 (×15): 100 mg via ORAL
  Filled 2011-04-17 (×18): qty 1

## 2011-04-17 MED ORDER — ONDANSETRON HCL 4 MG/2ML IJ SOLN: 4.0000 mg | Freq: Four times a day (QID) | INTRAMUSCULAR | Status: DC | PRN

## 2011-04-17 MED ORDER — HYDROCODONE-ACETAMINOPHEN 5-325 MG PO TABS
1.0000 | ORAL_TABLET | ORAL | Status: DC | PRN
  Filled 2011-04-17: qty 2

## 2011-04-17 MED ORDER — DEXTROSE 5 % IV SOLN
1.0000 g | INTRAVENOUS | Status: DC
  Administered 2011-04-18 – 2011-04-22 (×5): 1 g via INTRAVENOUS
  Filled 2011-04-17 (×8): qty 10

## 2011-04-17 MED ORDER — ALBUTEROL SULFATE (5 MG/ML) 0.5% IN NEBU: 2.5000 mg | INHALATION_SOLUTION | RESPIRATORY_TRACT | Status: DC | PRN

## 2011-04-17 MED ORDER — MORPHINE SULFATE 2 MG/ML IJ SOLN: 1.0000 mg | INTRAMUSCULAR | Status: DC | PRN

## 2011-04-17 MED ORDER — HYDRALAZINE HCL 50 MG PO TABS
50.0000 mg | ORAL_TABLET | Freq: Three times a day (TID) | ORAL | Status: DC
  Administered 2011-04-17 – 2011-04-22 (×15): 50 mg via ORAL
  Filled 2011-04-17 (×23): qty 1

## 2011-04-17 MED ORDER — FUROSEMIDE 10 MG/ML IJ SOLN
40.0000 mg | Freq: Once | INTRAMUSCULAR | Status: AC
  Administered 2011-04-17: 40 mg via INTRAMUSCULAR
  Filled 2011-04-17: qty 4

## 2011-04-17 MED ORDER — IPRATROPIUM BROMIDE 0.02 % IN SOLN: 0.5000 mg | RESPIRATORY_TRACT | Status: DC | PRN

## 2011-04-17 MED ORDER — CARVEDILOL 25 MG PO TABS
25.0000 mg | ORAL_TABLET | Freq: Two times a day (BID) | ORAL | Status: DC
  Administered 2011-04-17 – 2011-04-22 (×11): 25 mg via ORAL
  Filled 2011-04-17 (×15): qty 1

## 2011-04-17 MED ORDER — DEXTROSE 5 % IV SOLN
500.0000 mg | INTRAVENOUS | Status: DC
  Administered 2011-04-17 – 2011-04-19 (×3): 500 mg via INTRAVENOUS
  Filled 2011-04-17 (×5): qty 500

## 2011-04-17 MED ORDER — DEXTROSE 5 % IV SOLN
1.0000 g | Freq: Once | INTRAVENOUS | Status: AC
  Administered 2011-04-17: 1 g via INTRAVENOUS
  Filled 2011-04-17: qty 10

## 2011-04-17 MED ORDER — ALUM & MAG HYDROXIDE-SIMETH 200-200-20 MG/5ML PO SUSP
30.0000 mL | Freq: Four times a day (QID) | ORAL | Status: DC | PRN
  Administered 2011-04-17: 30 mL via ORAL
  Filled 2011-04-17: qty 30

## 2011-04-17 MED ORDER — POTASSIUM CHLORIDE CRYS ER 20 MEQ PO TBCR
40.0000 meq | EXTENDED_RELEASE_TABLET | Freq: Two times a day (BID) | ORAL | Status: AC
  Administered 2011-04-17 (×2): 40 meq via ORAL
  Filled 2011-04-17 (×2): qty 2

## 2011-04-17 NOTE — ED Notes (Signed)
Patient denies pain and is resting comfortably.  

## 2011-04-17 NOTE — ED Notes (Signed)
Vital signs stable. 

## 2011-04-17 NOTE — ED Notes (Signed)
Pt. Refused 0500 LABS

## 2011-04-17 NOTE — ED Notes (Signed)
-   seen by chaplain Ubaldo Glassing)

## 2011-04-17 NOTE — ED Notes (Signed)
MD at bedside. 

## 2011-04-17 NOTE — ED Notes (Signed)
2D echo done @ bedside

## 2011-04-17 NOTE — ED Notes (Signed)
Patient is resting comfortably. 

## 2011-04-17 NOTE — ED Notes (Signed)
Moving pt to TCU. Pt stable. No distress. Slight wheezing. Pt asleep.

## 2011-04-17 NOTE — ED Provider Notes (Signed)
History     CSN: [id number] Arrival date & time: 04/16/2011 10:29 PM   First MD Initiated Contact with Patient 04/16/11 [id number]      Chief Complaint  Patient presents with  . Pneumonia    (Consider location/radiation/quality/duration/timing/severity/associated sxs/prior treatment) Patient is a 60 y.o. female presenting with pneumonia. The history is provided by the patient. No language interpreter was used.  Pneumonia This is a new problem. The current episode started more than 2 days ago. The problem occurs constantly. The problem has been gradually worsening. Associated symptoms include chest pain and shortness of breath. Pertinent negatives include no abdominal pain and no headaches. The symptoms are aggravated by nothing. The symptoms are relieved by nothing. She has tried nothing for the symptoms. The treatment provided no relief.  Complains of CP and SOB and wheezing.  No rigors.  Positive sick contacts.  No n/v/d.    Past Medical History  Diagnosis Date  . Cancer   . Arthritis   . Gout   . CHF (congestive heart failure)     Past Surgical History  Procedure Date  . Abdominal hysterectomy   . Tonsillectomy     Family History  Problem Relation Age of Onset  . Hyperlipidemia Mother   . Hyperlipidemia Father     History  Substance Use Topics  . Smoking status: Never Smoker   . Smokeless tobacco: Never Used  . Alcohol Use: No    OB History    Grav Para Term Preterm Abortions TAB SAB Ect Mult Living                  Review of Systems  Constitutional: Negative for activity change.  HENT: Positive for facial swelling.   Respiratory: Positive for cough, shortness of breath and wheezing.   Cardiovascular: Positive for chest pain.  Gastrointestinal: Negative for abdominal pain and abdominal distention.  Genitourinary: Negative for difficulty urinating.  Musculoskeletal: Negative for arthralgias.  Skin: Negative.   Neurological: Negative for headaches.    Hematological: Negative.   Psychiatric/Behavioral: Negative.     Allergies  Review of patient's allergies indicates no known allergies.  Home Medications   Current Outpatient Rx  Name Route Sig Dispense Refill  . AMLODIPINE BESYLATE 5 MG PO TABS Oral Take 5 mg by mouth 2 (two) times daily.      Marland Kitchen CARVEDILOL 25 MG PO TABS Oral Take 25 mg by mouth 2 (two) times daily with a meal.      . FUROSEMIDE 40 MG PO TABS Oral Take 40 mg by mouth 2 (two) times daily.      Marland Kitchen HYDRALAZINE HCL 50 MG PO TABS Oral Take 50 mg by mouth 2 (two) times daily.      Marland Kitchen POTASSIUM CHLORIDE CRYS CR 20 MEQ PO TBCR Oral Take 40 mEq by mouth 2 (two) times daily.        BP 195/76  Pulse 95  Temp(Src) 99.1 F (37.3 C) (Oral)  Resp 18  Ht 5\' 5"  (1.651 m)  Wt 205 lb (92.987 kg)  BMI 34.11 kg/m2  SpO2 100%  Physical Exam  Vitals reviewed. Constitutional: She is oriented to person, place, and time. She appears well-developed and well-nourished. No distress.  HENT:  Head: Normocephalic and atraumatic.  Mouth/Throat: Oropharynx is clear and moist.  Eyes: EOM are normal. Pupils are equal, round, and reactive to light.  Neck: Normal range of motion. Neck supple. No tracheal deviation present.  Cardiovascular: Regular rhythm.  Exam reveals no gallop.  Pulmonary/Chest: She has wheezes. She has no rales.  Abdominal: Soft. Bowel sounds are normal. She exhibits no distension.  Musculoskeletal: Normal range of motion. She exhibits no edema.  Neurological: She is alert and oriented to person, place, and time.  Skin: Skin is warm and dry.  Psychiatric: She has a normal mood and affect.    ED Course  Procedures (including critical care time)  Labs Reviewed  CBC - Abnormal; Notable for the following:    WBC 18.5 (*)    Hemoglobin 11.4 (*)    HCT 35.8 (*)    MCH 25.8 (*)    Platelets 467 (*)    All other components within normal limits  POCT I-STAT, CHEM 8 - Abnormal; Notable for the following:    Potassium  3.3 (*)    Creatinine, Ser 1.90 (*)    Glucose, Bld 155 (*)    All other components within normal limits  POCT I-STAT TROPONIN I - Abnormal; Notable for the following:    Troponin i, poc 0.10 (*)    All other components within normal limits  DIFFERENTIAL  I-STAT, CHEM 8  I-STAT TROPONIN I   Dg Chest 2 View  04/17/2011  *RADIOLOGY REPORT*  Clinical Data: Cough and fever.  CHEST - 2 VIEW  Comparison: 08/03/2010 radiographs.  Findings: There is stable cardiomegaly with chronic vascular congestion.  Compared with the prior examination, there is mildly increased patchy opacity at the right lung base without consolidation. There is no significant pleural effusion.  The osseous structures appear unchanged.  IMPRESSION:  1.  Stable chronic cardiomegaly and vascular congestion. 2.  Increased patchy opacity at the right lung base most likely reflects atelectasis, although early pneumonia cannot be excluded in this setting.  Original Report Authenticated By: Vivia Ewing, M.D.     No diagnosis found.    MDM   Date: 04/17/2011  Rate: 96  Rhythm: normal sinus rhythm  QRS Axis: normal  Intervals: normal  ST/T Wave abnormalities: nonspecific ST changes  Conduction Disutrbances:none  Narrative Interpretation:   Old EKG Reviewed: changes noted  MDM Reviewed: nursing note and vitals Interpretation: labs, ECG and x-ray          Lyris Hitchman K Nehan Flaum-Rasch, MD 04/17/11 [phone]

## 2011-04-17 NOTE — Progress Notes (Signed)
  Echocardiogram 2D Echocardiogram has been performed.  Diamond Nickel Deneen 04/17/2011, 11:50 AM

## 2011-04-17 NOTE — ED Notes (Signed)
RT called for Nebulized TX

## 2011-04-17 NOTE — Progress Notes (Signed)
Subjective:  Patient seen, feels better. BNP is elevated,and also the d Dimer  Objective: Vital signs in last 24 hours: Temp:  [99.1 F (37.3 C)-100.5 F (38.1 C)] 99.1 F (37.3 C) (12/07 0415) Pulse Rate:  [95-106] 95  (12/07 0021) Resp:  [18-22] 22  (12/07 0415) BP: (163-195)/(76-85) 163/85 mmHg (12/07 0415) SpO2:  [94 %-100 %] 98 % (12/07 0415) Weight:  [92.987 kg (205 lb)] 205 lb (92.987 kg) (12/06 2254) Weight change:     Intake/Output from previous day:       Physical Exam: General: Alert, awake, oriented x3, in no acute distress. Heart: Regular rate and rhythm, without murmurs, rubs, gallops. Lungs: Clear to auscultation bilaterally.    Lab Results: Basic Metabolic Panel:  Basename 04/17/11 0700 04/17/11 0335 04/17/11 0103  NA 135 -- 143  K 3.2* -- 3.3*  CL 102 -- 106  CO2 22 -- --  GLUCOSE 122* -- 155*  BUN 15 -- 16  CREATININE 1.85* 1.74* --  CALCIUM 8.9 -- --  MG -- -- --  PHOS -- -- --   Liver Function Tests: No results found for this basename: AST:2,ALT:2,ALKPHOS:2,BILITOT:2,PROT:2,ALBUMIN:2 in the last 72 hours No results found for this basename: LIPASE:2,AMYLASE:2 in the last 72 hours No results found for this basename: AMMONIA:2 in the last 72 hours CBC:  Basename 04/17/11 0700 04/17/11 0335 04/17/11 0053  WBC 18.0* 17.9* --  NEUTROABS -- -- 15.1*  HGB 10.3* 11.0* --  HCT 32.1* 33.9* --  MCV 80.7 80.9 --  PLT 414* 470* --   Cardiac Enzymes:  Basename 04/17/11 0700 04/17/11 0250  CKTOTAL -- 179*  CKMB -- 1.9  CKMBINDEX -- --  TROPONINI <0.30 <0.30   BNP:  Basename 04/17/11 0700  POCBNP 9405.0*   D-Dimer:  Flo Shanks 04/17/11 0335  DDIMER 2.70*   CBG: No results found for this basename: GLUCAP:6 in the last 72 hours Hemoglobin A1C: No results found for this basename: HGBA1C in the last 72 hours Fasting Lipid Panel:  Basename 04/17/11 0700  CHOL 136  HDL 22*  LDLCALC 91  TRIG 113  CHOLHDL 6.2  LDLDIRECT --     Studies/Results: Dg Chest 2 View  04/17/2011  *RADIOLOGY REPORT*  Clinical Data: Cough and fever.  CHEST - 2 VIEW  Comparison: 08/03/2010 radiographs.  Findings: There is stable cardiomegaly with chronic vascular congestion.  Compared with the prior examination, there is mildly increased patchy opacity at the right lung base without consolidation. There is no significant pleural effusion.  The osseous structures appear unchanged.  IMPRESSION:  1.  Stable chronic cardiomegaly and vascular congestion. 2.  Increased patchy opacity at the right lung base most likely reflects atelectasis, although early pneumonia cannot be excluded in this setting.  Original Report Authenticated By: Vivia Ewing, M.D.    Medications: Scheduled Meds:   . albuterol  5 mg Nebulization Once  . amLODipine  5 mg Oral BID  . aspirin  325 mg Oral Daily  . azithromycin  500 mg Intravenous Q24H  . azithromycin  500 mg Oral Once  . benzonatate  100 mg Oral TID  . carvedilol  25 mg Oral BID WC  . cefTRIAXone (ROCEPHIN)  IV  1 g Intravenous Once  . cefTRIAXone (ROCEPHIN)  IV  1 g Intravenous Q24H  . enoxaparin  40 mg Subcutaneous QHS  . furosemide  40 mg Intramuscular Once  . furosemide  40 mg Oral BID  . hydrALAZINE  50 mg Oral Q8H  . ipratropium  0.5 mg Nebulization Once  . potassium chloride  40 mEq Oral Once   Continuous Infusions:  PRN Meds:.albuterol, alum & mag hydroxide-simeth, HYDROcodone-acetaminophen, ipratropium, morphine, ondansetron (ZOFRAN) IV, ondansetron, sodium chloride  Assessment/Plan:  Principal Problem:  *Chest pain Sec to pneumonia Cont rocephin , zithromax.  Elevated D dimer Check V/Q scan to r/o PE  Elevated BNP Check 2d echo      LOS: 1 day   [facility] Pager: [phone] 04/17/2011, 9:13 AM

## 2011-04-17 NOTE — H&P (Addendum)
PCP:   None, uses urgent care  Chief Complaint:  Cough  HPI: This is a 60 year old female who states that on Sunday she began having a hacking cough. This hacking cough continues until today. Cough productive of whitish sputum. She developed shortness of breath, no wheezing, patient states she could hardly catch her breath between her cough and shortness of breath. She reports no fevers, no chills. She did have posttussive emesis, no evidence of blood. Yesterday she developed abdominal pain [generalized] and diarrhea. No blood in her stool. Today while taking out the trash she developed centralized chest pain. She became light headed and had to sit. She had no loss of consciousness. she has not been eating or drinking over the past 2 days and she's been feeling weak. 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 she has no cardiac history, states chest pain is not reproducible. She went to urgent care and was sent here to the ER. During my interview patient was coughing continuously and bringing up white-colored sputum. History obtained from patient. She has been on no recent antibiotics.Here in our ER patient has a low-grade fever.   Review of Systems: Positives bolded  The patient denies anorexia, fever, weight loss,, vision loss, decreased hearing, hoarseness, chest pain, syncope, dyspnea on exertion, peripheral edema, balance deficits, hemoptysis, abdominal pain, melena, hematochezia, severe indigestion/heartburn, hematuria, incontinence, genital sores, muscle weakness, suspicious skin lesions, transient blindness, difficulty walking, depression, unusual weight change, abnormal bleeding, enlarged lymph nodes, angioedema, and breast masses.  Past Medical History: Past Medical History  Diagnosis Date  . Cancer   . Arthritis   . Gout   . CHF (congestive heart failure)   . Lupus (systemic lupus erythematosus)    Past Surgical History  Procedure Date  . Abdominal hysterectomy   .  Tonsillectomy     Medications: Prior to Admission medications   Medication Sig Start Date End Date Taking? Authorizing Provider  amLODipine (NORVASC) 5 MG tablet Take 5 mg by mouth 2 (two) times daily.     Yes Historical Provider, MD  carvedilol (COREG) 25 MG tablet Take 25 mg by mouth 2 (two) times daily with a meal.     Yes Historical Provider, MD  furosemide (LASIX) 40 MG tablet Take 40 mg by mouth 2 (two) times daily.     Yes Historical Provider, MD  hydrALAZINE (APRESOLINE) 50 MG tablet Take 50 mg by mouth 2 (two) times daily.     Yes Historical Provider, MD  potassium chloride SA (K-DUR,KLOR-CON) 20 MEQ tablet Take 40 mEq by mouth 2 (two) times daily.     Yes Historical Provider, MD    Allergies:  No Known Allergies  Social History:  reports that she has never smoked. She has never used smokeless tobacco. She reports that she does not drink alcohol or use illicit drugs.  Family History: Family History  Problem Relation Age of Onset  . Hypertension Mother   . Hyperlipidemia Father   . Colon cancer    . Breast cancer      Physical Exam: Filed Vitals:   04/16/11 2254 04/16/11 2257 04/17/11 0021 04/17/11 0037  BP: 188/83  195/76   Pulse: 106  95   Temp: 100.5 F (38.1 C)  99.1 F (37.3 C)   TempSrc:   Oral   Resp:   18   Height: 5\' 5"  (1.651 m)     Weight: 92.987 kg (205 lb)     SpO2: 98% 99% 96% 100%  General:  Alert and oriented times three, well developed and nourished, no acute distress Eyes: PERRLA, pink conjunctiva, no scleral icterus ENT: Moist oral mucosa, neck supple, no thyromegaly Lungs: clear to ascultation, no wheeze, no crackles, no use of accessory muscles Cardiovascular: regular rate and rhythm, no regurgitation, no gallops, no murmurs. No carotid bruits, no JVD Abdomen: soft, positive BS, non-tender, non-distended, no organomegaly, not an acute abdomen GU: not examined Neuro: CN II - XII grossly intact, sensation intact Musculoskeletal: strength  5/5 all extremities, no clubbing, cyanosis or edema, no producible chest wall pain Skin: no rash, no subcutaneous crepitation, no decubitus Psych: appropriate patient   Labs on Admission:   Basename 04/17/11 0103  NA 143  K 3.3*  CL 106  CO2 --  GLUCOSE 155*  BUN 16  CREATININE 1.90*  CALCIUM --  MG --  PHOS --   No results found for this basename: AST:2,ALT:2,ALKPHOS:2,BILITOT:2,PROT:2,ALBUMIN:2 in the last 72 hours No results found for this basename: LIPASE:2,AMYLASE:2 in the last 72 hours  Basename 04/17/11 0103 04/17/11 0053  WBC -- 18.5*  NEUTROABS -- 15.1*  HGB 13.6 11.4*  HCT 40.0 35.8*  MCV -- 81.0  PLT -- 467*   No results found for this basename: CKTOTAL:3,CKMB:3,CKMBINDEX:3,TROPONINI:3 in the last 72 hours No results found for this basename: TSH,T4TOTAL,FREET3,T3FREE,THYROIDAB in the last 72 hours No results found for this basename: VITAMINB12:2,FOLATE:2,FERRITIN:2,TIBC:2,IRON:2,RETICCTPCT:2 in the last 72 hours  Radiological Exams on Admission: Dg Chest 2 View  04/17/2011  *RADIOLOGY REPORT*  Clinical Data: Cough and fever.  CHEST - 2 VIEW  Comparison: 08/03/2010 radiographs.  Findings: There is stable cardiomegaly with chronic vascular congestion.  Compared with the prior examination, there is mildly increased patchy opacity at the right lung base without consolidation. There is no significant pleural effusion.  The osseous structures appear unchanged.  IMPRESSION:  1.  Stable chronic cardiomegaly and vascular congestion. 2.  Increased patchy opacity at the right lung base most likely reflects atelectasis, although early pneumonia cannot be excluded in this setting.  Original Report Authenticated By: Vivia Ewing, M.D.   EKG; normal sinus rhythm  Assessment/Plan Present on Admission:  .SIRS (systemic inflammatory response syndrome) .Chest pain  admit to telemetry Given the patient's leukocytosis and low-grade fever the wedding she patient for early  pneumonia. Will obtain sputum and blood cultures and cultures. Patient's chest x-ray shows vascular congestion. I'll give her a single dose of Lasix. Order BNP and given her chest pain we'll cycle cardiac enzymes. Lipid panel also be ordered. Patient does have a history of CHF. Patient troponin is 0.1. Monitor. ASA, DuoNeb's, Tessalon Perles ordered Rapid flu and d-dimer will be ordered Gastroenteritis C. difficile toxins ordered Antiemetics as needed Hypertension Uncontrolled, hydralazine increased Gout History of lupus chronic kidney disease  Stable Resume home meds Hypokalemia Single dose of potassium given   Full code DVT prophylaxis   team 6/Dr. Darrick Meigs  Time in; 2:15 AM Time out; 2:50 AM   Brittany Charles 04/17/2011, 2:36 AM

## 2011-04-18 ENCOUNTER — Encounter (HOSPITAL_COMMUNITY): Payer: Self-pay | Admitting: Cardiology

## 2011-04-18 DIAGNOSIS — I509 Heart failure, unspecified: Secondary | ICD-10-CM

## 2011-04-18 DIAGNOSIS — R9389 Abnormal findings on diagnostic imaging of other specified body structures: Secondary | ICD-10-CM

## 2011-04-18 LAB — BASIC METABOLIC PANEL
BUN: 19 mg/dL (ref 6–23)
Creatinine, Ser: 2.06 mg/dL — ABNORMAL HIGH (ref 0.50–1.10)
GFR calc Af Amer: 29 mL/min — ABNORMAL LOW (ref >90)
GFR calc non Af Amer: 25 mL/min — ABNORMAL LOW (ref >90)
Potassium: 3.3 mEq/L — ABNORMAL LOW (ref 3.5–5.1)

## 2011-04-18 LAB — CBC
Hemoglobin: 9.7 g/dL — ABNORMAL LOW (ref 12.0–15.0)
MCHC: 31.1 g/dL (ref 30.0–36.0)
RDW: 15.7 % — ABNORMAL HIGH (ref 11.5–15.5)
WBC: 12.6 10*3/uL — ABNORMAL HIGH (ref 4.0–10.5)

## 2011-04-18 LAB — CLOSTRIDIUM DIFFICILE BY PCR: Toxigenic C. Difficile by PCR: NEGATIVE

## 2011-04-18 LAB — TROPONIN I: Troponin I: <0.3 ng/mL (ref <0.30)

## 2011-04-18 MED ORDER — ASPIRIN 81 MG PO CHEW
324.0000 mg | CHEWABLE_TABLET | ORAL | Status: AC
  Filled 2011-04-18: qty 4

## 2011-04-18 MED ORDER — SODIUM CHLORIDE 0.9 % IJ SOLN
3.0000 mL | Freq: Two times a day (BID) | INTRAMUSCULAR | Status: DC
  Administered 2011-04-18 – 2011-04-21 (×8): 3 mL via INTRAVENOUS

## 2011-04-18 MED ORDER — SODIUM CHLORIDE 0.9 % IJ SOLN: 3.0000 mL | INTRAMUSCULAR | Status: DC | PRN

## 2011-04-18 MED ORDER — DIAZEPAM 2 MG PO TABS: 2.0000 mg | ORAL_TABLET | ORAL | Status: AC

## 2011-04-18 MED ORDER — ASPIRIN 81 MG PO CHEW
324.0000 mg | CHEWABLE_TABLET | ORAL | Status: DC
  Filled 2011-04-18: qty 4

## 2011-04-18 MED ORDER — SODIUM CHLORIDE 0.9 % IV SOLN: INTRAVENOUS | Status: DC

## 2011-04-18 MED ORDER — DIAZEPAM 2 MG PO TABS: 2.0000 mg | ORAL_TABLET | ORAL | Status: DC

## 2011-04-18 MED ORDER — POTASSIUM CHLORIDE CRYS ER 20 MEQ PO TBCR
20.0000 meq | EXTENDED_RELEASE_TABLET | Freq: Two times a day (BID) | ORAL | Status: DC
Start: 2011-04-18 — End: 2011-04-18
  Administered 2011-04-18: 20 meq via ORAL
  Filled 2011-04-18 (×2): qty 1

## 2011-04-18 MED ORDER — SODIUM CHLORIDE 0.9 % IV SOLN: 250.0000 mL | INTRAVENOUS | Status: DC | PRN

## 2011-04-18 NOTE — Consults (Signed)
HPI: 60 year old female with past medical history of lupus and renal insufficiency who we are asked to evaluate for congestive heart failure and abnormal echocardiogram. Patient apparently had volume excess in March of 2012. An echocardiogram showed an ejection fraction of 50-55%, moderate tricuspid regurgitation and severe pulmonary hypertension. Patient was admitted on December 7 complaints of cough and possible pneumonia. Her BNP was noted to be elevated. An echocardiogram was obtained that showed an ejection fraction of 45% with inferolateral hypokinesis. Cardiology is therefore asked to evaluate. The patient states at the time of admission she had a productive cough and shortness of breath because of her cough. There was no orthopnea or PND. In reviewing her history she describes "indigestion" with exertion for approximately one year relieved with rest. She has chronic dyspnea on exertion and occasional pedal edema.  Medications Prior to Admission  Medication Dose Route Frequency Provider Last Rate Last Dose  . ipratropium (ATROVENT) nebulizer solution 0.5 mg  0.5 mg Nebulization Q4H PRN Debby Crosley, MD       And  . albuterol (PROVENTIL) (5 MG/ML) 0.5% nebulizer solution 2.5 mg  2.5 mg Nebulization Q4H PRN Debby Crosley, MD      . albuterol (PROVENTIL) (5 MG/ML) 0.5% nebulizer solution 5 mg  5 mg Nebulization Once April K Palumbo-Rasch, MD   5 mg at 04/17/11 0037  . alum & mag hydroxide-simeth (MAALOX/MYLANTA) 200-200-20 MG/5ML suspension 30 mL  30 mL Oral Q6H PRN Debby Crosley, MD   30 mL at 04/17/11 0350  . amLODipine (NORVASC) tablet 5 mg  5 mg Oral BID Debby Crosley, MD   5 mg at 04/18/11 1007  . aspirin tablet 325 mg  325 mg Oral Daily Debby Crosley, MD   325 mg at 04/18/11 1007  . azithromycin (ZITHROMAX) 500 mg in dextrose 5 % 250 mL IVPB  500 mg Intravenous Q24H Debby Crosley, MD   500 mg at 04/18/11 0428  . azithromycin (ZITHROMAX) tablet 500 mg  500 mg Oral Once April K Palumbo-Rasch, MD    500 mg at 04/17/11 0120  . benzonatate (TESSALON) capsule 100 mg  100 mg Oral TID Quintella Baton, MD   100 mg at 04/18/11 1006  . carvedilol (COREG) tablet 25 mg  25 mg Oral BID WC Debby Crosley, MD   25 mg at 04/18/11 1006  . cefTRIAXone (ROCEPHIN) 1 g in dextrose 5 % 50 mL IVPB  1 g Intravenous Once April K Palumbo-Rasch, MD   1 g at 04/17/11 0121  . cefTRIAXone (ROCEPHIN) 1 g in dextrose 5 % 50 mL IVPB  1 g Intravenous Q24H Debby Crosley, MD   1 g at 04/18/11 0602  . enoxaparin (LOVENOX) injection 40 mg  40 mg Subcutaneous QHS Debby Crosley, MD   40 mg at 04/17/11 2147  . furosemide (LASIX) injection 40 mg  40 mg Intramuscular Once Debby Crosley, MD   40 mg at 04/17/11 0347  . furosemide (LASIX) tablet 40 mg  40 mg Oral BID Debby Crosley, MD   40 mg at 04/18/11 1006  . guaiFENesin-dextromethorphan (ROBITUSSIN DM) 100-10 MG/5ML syrup 5 mL  5 mL Oral Q4H PRN Dianne Dun   5 mL at 04/18/11 0427  . hydrALAZINE (APRESOLINE) tablet 50 mg  50 mg Oral Q8H Debby Crosley, MD   50 mg at 04/18/11 1315  . HYDROcodone-acetaminophen (NORCO) 5-325 MG per tablet 1-2 tablet  1-2 tablet Oral Q4H PRN Debby Crosley, MD      . ipratropium (ATROVENT) nebulizer solution 0.5  mg  0.5 mg Nebulization Once April K Palumbo-Rasch, MD   0.5 mg at 04/17/11 0037  . morphine 2 MG/ML injection 1 mg  1 mg Intravenous Q4H PRN Debby Crosley, MD      . ondansetron (ZOFRAN) tablet 4 mg  4 mg Oral Q6H PRN Debby Crosley, MD       Or  . ondansetron (ZOFRAN) injection 4 mg  4 mg Intravenous Q6H PRN Debby Crosley, MD      . potassium chloride SA (K-DUR,KLOR-CON) CR tablet 20 mEq  20 mEq Oral BID Gagan S Lama   20 mEq at 04/18/11 1435  . potassium chloride SA (K-DUR,KLOR-CON) CR tablet 40 mEq  40 mEq Oral Once Quintella Baton, MD   40 mEq at 04/17/11 0351  . potassium chloride SA (K-DUR,KLOR-CON) CR tablet 40 mEq  40 mEq Oral BID Gagan S Lama   40 mEq at 04/17/11 2148  . sodium chloride 0.9 % injection 3 mL  3 mL Intravenous PRN  Debby Crosley, MD      . technetium albumin aggregated (MAA) injection solution 3 milli Curie  3 milli Curie Intravenous Once PRN Medication Radiologist       No current outpatient prescriptions on file as of 04/18/2011.    Allergies  Allergen Reactions  . Zithromax (Azithromycin) Nausea And Vomiting    Past Medical History  Diagnosis Date  . CHF (congestive heart failure)   . Lupus (systemic lupus erythematosus)   . Hypertension   . Fibroids   . Renal insufficiency     Past Surgical History  Procedure Date  . Tonsillectomy   . Abdominal hysterectomy 10/27/2000    TAH, BSO    History   Social History  . Marital Status: Single    Spouse Name: N/A    Number of Children: N/A  . Years of Education: N/A   Occupational History  . Not on file.   Social History Main Topics  . Smoking status: Never Smoker   . Smokeless tobacco: Never Used  . Alcohol Use: No  . Drug Use: No  . Sexually Active: No   Other Topics Concern  . Not on file   Social History Narrative  . No narrative on file    Family History  Problem Relation Age of Onset  . Hypertension Mother   . Hyperlipidemia Father   . Colon cancer    . Breast cancer    . Heart disease Mother     CHF    ROS: Productive cough but no fevers or chills, hemoptysis, dysphasia, odynophagia, melena, hematochezia, dysuria, hematuria, rash, seizure activity, orthopnea, PND, claudication. Remaining systems are negative.  Physical Exam:   Blood pressure 108/64, pulse 76, temperature 98.6 F (37 C), temperature source Oral, resp. rate 24, height 5\' 5"  (1.651 m), weight 183 lb 12.8 oz (83.371 kg), SpO2 96.00%.  General:  Well developed/well nourished in NAD Skin warm/dry Patient not depressed No peripheral clubbing Back-normal HEENT-normal/normal eyelids Neck supple/normal carotid upstroke bilaterally; no bruits; no JVD; no thyromegaly chest - CTA/ normal expansion CV - RRR/normal S1 and S2; no rubs or gallops;  PMI  nondisplaced; 2/6 systolic murmur LSB Abdomen -NT/ND, no HSM, no mass, + bowel sounds, no bruit 2+ femoral pulses, no bruits Ext-no edema, chords, 2+ DP Neuro-grossly nonfocal  ECG Sinus rhythm, LAE, nonspecific ST changes.  Results for orders placed during the hospital encounter of 04/16/11 (from the past 48 hour(s))  CBC     Status: Abnormal   Collection Time  04/17/11 12:53 AM      Component Value Range Comment   WBC 18.5 (*) 4.0 - 10.5 (K/uL)    RBC 4.42  3.87 - 5.11 (MIL/uL)    Hemoglobin 11.4 (*) 12.0 - 15.0 (g/dL)    HCT 35.8 (*) 36.0 - 46.0 (%)    MCV 81.0  78.0 - 100.0 (fL)    MCH 25.8 (*) 26.0 - 34.0 (pg)    MCHC 31.8  30.0 - 36.0 (g/dL)    RDW 15.4  11.5 - 15.5 (%)    Platelets 467 (*) 150 - 400 (K/uL)   DIFFERENTIAL     Status: Abnormal   Collection Time   04/17/11 12:53 AM      Component Value Range Comment   Neutrophils Relative 82 (*) 43 - 77 (%)    Lymphocytes Relative 10 (*) 12 - 46 (%)    Monocytes Relative 7  3 - 12 (%)    Eosinophils Relative 0  0 - 5 (%)    Basophils Relative 1  0 - 1 (%)    Neutro Abs 15.1 (*) 1.7 - 7.7 (K/uL)    Lymphs Abs 1.9  0.7 - 4.0 (K/uL)    Monocytes Absolute 1.3 (*) 0.1 - 1.0 (K/uL)    Eosinophils Absolute 0.0  0.0 - 0.7 (K/uL)    Basophils Absolute 0.2 (*) 0.0 - 0.1 (K/uL)    WBC Morphology TOXIC GRANULATION     POCT I-STAT TROPONIN I     Status: Abnormal   Collection Time   04/17/11  1:02 AM      Component Value Range Comment   Troponin i, poc 0.10 (*) 0.00 - 0.08 (ng/mL)    Comment NOTIFIED PHYSICIAN      Comment 3            POCT I-STAT, CHEM 8     Status: Abnormal   Collection Time   04/17/11  1:03 AM      Component Value Range Comment   Sodium 143  135 - 145 (mEq/L)    Potassium 3.3 (*) 3.5 - 5.1 (mEq/L)    Chloride 106  96 - 112 (mEq/L)    BUN 16  6 - 23 (mg/dL)    Creatinine, Ser 1.90 (*) 0.50 - 1.10 (mg/dL)    Glucose, Bld 155 (*) 70 - 99 (mg/dL)    Calcium, Ion 1.16  1.12 - 1.32 (mmol/L)    TCO2 25  0 - 100  (mmol/L)    Hemoglobin 13.6  12.0 - 15.0 (g/dL)    HCT 40.0  36.0 - 46.0 (%)   CULTURE, BLOOD (ROUTINE X 2)     Status: Normal (Preliminary result)   Collection Time   04/17/11  2:42 AM      Component Value Range Comment   Specimen Description BLOOD RIGHT ARM      Special Requests BOTTLES DRAWN AEROBIC AND ANAEROBIC 2 CC EA      Setup Time [id number]      Culture        Value:        BLOOD CULTURE RECEIVED NO GROWTH TO DATE CULTURE WILL BE HELD FOR 5 DAYS BEFORE ISSUING A FINAL NEGATIVE REPORT   Report Status PENDING     CULTURE, BLOOD (ROUTINE X 2)     Status: Normal (Preliminary result)   Collection Time   04/17/11  2:43 AM      Component Value Range Comment   Specimen Description BLOOD LEFT ANTECUBITAL  Special Requests BOTTLES DRAWN AEROBIC AND ANAEROBIC 5 CC EA      Setup Time [id number]      Culture        Value:        BLOOD CULTURE RECEIVED NO GROWTH TO DATE CULTURE WILL BE HELD FOR 5 DAYS BEFORE ISSUING A FINAL NEGATIVE REPORT   Report Status PENDING     CARDIAC PANEL(CRET KIN+CKTOT+MB+TROPI)     Status: Abnormal   Collection Time   04/17/11  2:50 AM      Component Value Range Comment   Total CK 179 (*) 7 - 177 (U/L)    CK, MB 1.9  0.3 - 4.0 (ng/mL)    Troponin I <0.30  <0.30 (ng/mL)    Relative Index 1.1  0.0 - 2.5    D-DIMER, QUANTITATIVE     Status: Abnormal   Collection Time   04/17/11  3:35 AM      Component Value Range Comment   D-Dimer, Quant 2.70 (*) 0.00 - 0.48 (ug/mL-FEU)   CBC     Status: Abnormal   Collection Time   04/17/11  3:35 AM      Component Value Range Comment   WBC 17.9 (*) 4.0 - 10.5 (K/uL)    RBC 4.19  3.87 - 5.11 (MIL/uL)    Hemoglobin 11.0 (*) 12.0 - 15.0 (g/dL)    HCT 33.9 (*) 36.0 - 46.0 (%)    MCV 80.9  78.0 - 100.0 (fL)    MCH 26.3  26.0 - 34.0 (pg)    MCHC 32.4  30.0 - 36.0 (g/dL)    RDW 15.4  11.5 - 15.5 (%)    Platelets 470 (*) 150 - 400 (K/uL)   CREATININE, SERUM     Status: Abnormal   Collection Time   04/17/11  3:35 AM        Component Value Range Comment   Creatinine, Ser 1.74 (*) 0.50 - 1.10 (mg/dL)    GFR calc non Af Amer 31 (*) >90 (mL/min)    GFR calc Af Amer 36 (*) >90 (mL/min)   INFLUENZA PANEL BY PCR     Status: Normal   Collection Time   04/17/11  6:30 AM      Component Value Range Comment   Influenza A By PCR NEGATIVE  NEGATIVE     Influenza B By PCR NEGATIVE  NEGATIVE     H1N1 flu by pcr NOT DETECTED  NOT DETECTED    CULTURE, RESPIRATORY     Status: Normal (Preliminary result)   Collection Time   04/17/11  6:30 AM      Component Value Range Comment   Specimen Description SPUTUM      Special Requests NONE      Gram Stain        Value: RARE WBC PRESENT, PREDOMINANTLY MONONUCLEAR     NO SQUAMOUS EPITHELIAL CELLS SEEN     NO ORGANISMS SEEN   Culture NORMAL OROPHARYNGEAL FLORA      Report Status PENDING     BASIC METABOLIC PANEL     Status: Abnormal   Collection Time   04/17/11  7:00 AM      Component Value Range Comment   Sodium 135  135 - 145 (mEq/L)    Potassium 3.2 (*) 3.5 - 5.1 (mEq/L)    Chloride 102  96 - 112 (mEq/L)    CO2 22  19 - 32 (mEq/L)    Glucose, Bld 122 (*) 70 - 99 (mg/dL)  BUN 15  6 - 23 (mg/dL)    Creatinine, Ser 1.85 (*) 0.50 - 1.10 (mg/dL)    Calcium 8.9  8.4 - 10.5 (mg/dL)    GFR calc non Af Amer 29 (*) >90 (mL/min)    GFR calc Af Amer 33 (*) >90 (mL/min)   CBC     Status: Abnormal   Collection Time   04/17/11  7:00 AM      Component Value Range Comment   WBC 18.0 (*) 4.0 - 10.5 (K/uL)    RBC 3.98  3.87 - 5.11 (MIL/uL)    Hemoglobin 10.3 (*) 12.0 - 15.0 (g/dL)    HCT 32.1 (*) 36.0 - 46.0 (%)    MCV 80.7  78.0 - 100.0 (fL)    MCH 25.9 (*) 26.0 - 34.0 (pg)    MCHC 32.1  30.0 - 36.0 (g/dL)    RDW 15.4  11.5 - 15.5 (%)    Platelets 414 (*) 150 - 400 (K/uL)   TROPONIN I     Status: Normal   Collection Time   04/17/11  7:00 AM      Component Value Range Comment   Troponin I <0.30  <0.30 (ng/mL)   LIPID PANEL     Status: Abnormal   Collection Time   04/17/11   7:00 AM      Component Value Range Comment   Cholesterol 136  0 - 200 (mg/dL)    Triglycerides 113  <150 (mg/dL)    HDL 22 (*) >39 (mg/dL)    Total CHOL/HDL Ratio 6.2      VLDL 23  0 - 40 (mg/dL)    LDL Cholesterol 91  0 - 99 (mg/dL)   PRO B NATRIURETIC PEPTIDE     Status: Abnormal   Collection Time   04/17/11  7:00 AM      Component Value Range Comment   BNP, POC 9405.0 (*) 0 - 125 (pg/mL)   TROPONIN I     Status: Normal   Collection Time   04/17/11  4:00 PM      Component Value Range Comment   Troponin I <0.30  <0.30 (ng/mL)   TROPONIN I     Status: Normal   Collection Time   04/18/11 12:35 AM      Component Value Range Comment   Troponin I <0.30  <0.30 (ng/mL)   BASIC METABOLIC PANEL     Status: Abnormal   Collection Time   04/18/11  5:00 AM      Component Value Range Comment   Sodium 136  135 - 145 (mEq/L)    Potassium 3.3 (*) 3.5 - 5.1 (mEq/L)    Chloride 104  96 - 112 (mEq/L)    CO2 24  19 - 32 (mEq/L)    Glucose, Bld 134 (*) 70 - 99 (mg/dL)    BUN 19  6 - 23 (mg/dL)    Creatinine, Ser 2.06 (*) 0.50 - 1.10 (mg/dL)    Calcium 8.9  8.4 - 10.5 (mg/dL)    GFR calc non Af Amer 25 (*) >90 (mL/min)    GFR calc Af Amer 29 (*) >90 (mL/min)   CBC     Status: Abnormal   Collection Time   04/18/11  5:00 AM      Component Value Range Comment   WBC 12.6 (*) 4.0 - 10.5 (K/uL)    RBC 3.88  3.87 - 5.11 (MIL/uL)    Hemoglobin 9.7 (*) 12.0 - 15.0 (g/dL)    HCT 31.2 (*)  36.0 - 46.0 (%)    MCV 80.4  78.0 - 100.0 (fL)    MCH 25.0 (*) 26.0 - 34.0 (pg)    MCHC 31.1  30.0 - 36.0 (g/dL)    RDW 15.7 (*) 11.5 - 15.5 (%)    Platelets 477 (*) 150 - 400 (K/uL)     Dg Chest 2 View  04/17/2011  *RADIOLOGY REPORT*  Clinical Data: Cough and fever.  CHEST - 2 VIEW  Comparison: 08/03/2010 radiographs.  Findings: There is stable cardiomegaly with chronic vascular congestion.  Compared with the prior examination, there is mildly increased patchy opacity at the right lung base without consolidation.  There is no significant pleural effusion.  The osseous structures appear unchanged.  IMPRESSION:  1.  Stable chronic cardiomegaly and vascular congestion. 2.  Increased patchy opacity at the right lung base most likely reflects atelectasis, although early pneumonia cannot be excluded in this setting.  Original Report Authenticated By: Vivia Ewing, M.D.   Nm Pulmonary Perfusion  04/17/2011  *RADIOLOGY REPORT*  Clinical Data:  Chest pain and elevated D-dimer  NUCLEAR MEDICINE PERFUSION LUNG SCAN  Technique:  Perfusion images were obtained in multiple projections after intravenous injection of Tc-30m MAA.  Radiopharmaceuticals: 3.2  mCi Tc-58m MAA.  Comparison:  Chest radiograph from 04/17/2011 and VQ scan dated 08/04/2010  Findings: The perfusion portion of the examination is unchanged from 08/04/2010.  There are no medium or large size wedge shaped peripheral perfusion defects to suggest acute pulmonary embolism.  IMPRESSION:  1.  Low probability for acute pulmonary embolus.  Original Report Authenticated By: Angelita Ingles, M.D.    Assessment/Plan Patient Active Hospital Problem List: Chest pain (04/17/2011)  patient presented with probable pneumonia. However she notes "indigestion" with exertion relieved with rest for approximately one year. She also has inferior lateral hypokinesis on her echocardiogram. She also has some volume excess. I think right and left cardiac catheterization is indicated. The risks and benefits were discussed including stroke, myocardial infarction and death were discussed and she agrees to proceed. We also discussed renal insufficiency. Hold Lasix in anticipation of procedure. No V. gram. Watch renal function closely following procedure. Gentle hydration prior to her procedure but careful given mild CHF.  Hypertension (04/17/2011) Blood pressure controlled. Continue present medications.   Lupus (systemic lupus erythematosus) (04/17/2011) Management per primary care.    Pneumonia Continue antibiotics.  Renal Insuffiency Chronic; follow renal function following procedure.  Dyspnea Most likely a combination of pneumonia and volume excess. Note she also had severe pulmonary hypertension on her echocardiogram in March. We'll most likely need to resume diuretics after her renal function is stable following catheterization. VQ scan shows no pulmonary embolus. Kirk Ruths MD 04/18/2011, 3:06 PM

## 2011-04-18 NOTE — Progress Notes (Signed)
Subjective:  Patient seen, feels better. BNP is elevated, 2D echo shows CHF with EF 45%, and inferior and lateral wall hypokinesis. V/Q scan is negative.  Objective: Vital signs in last 24 hours: Temp:  [98 F (36.7 C)-99 F (37.2 C)] 99 F (37.2 C) (12/08 0537) Pulse Rate:  [74-90] 80  (12/08 0537) Resp:  [18-30] 30  (12/08 0537) BP: (114-124)/(59-70) 114/68 mmHg (12/08 0537) SpO2:  [94 %-99 %] 96 % (12/08 0537) Weight:  [83.371 kg (183 lb 12.8 oz)-98.9 kg (218 lb 0.6 oz)] 183 lb 12.8 oz (83.371 kg) (12/08 0537) Weight change: 5.913 kg (13 lb 0.6 oz) Last BM Date: 04/17/11  Intake/Output from previous day: 12/07 0701 - 12/08 0700 In: 780 [P.O.:480; IV Piggyback:300] Out: -      Physical Exam: General: Alert, awake, oriented x3, in no acute distress. Heart: Regular rate and rhythm, without murmurs, rubs, gallops. Lungs: Clear to auscultation bilaterally. Abdomen: soft, nontender Ext : No cyanosis, clubbing, edema    Lab Results: Basic Metabolic Panel:  Basename 04/18/11 0500 04/17/11 0700  NA 136 135  K 3.3* 3.2*  CL 104 102  CO2 24 22  GLUCOSE 134* 122*  BUN 19 15  CREATININE 2.06* 1.85*  CALCIUM 8.9 8.9  MG -- --  PHOS -- --   CBC:  Basename 04/18/11 0500 04/17/11 0700 04/17/11 0053  WBC 12.6* 18.0* --  NEUTROABS -- -- 15.1*  HGB 9.7* 10.3* --  HCT 31.2* 32.1* --  MCV 80.4 80.7 --  PLT 477* 414* --   Cardiac Enzymes:  Basename 04/18/11 0035 04/17/11 1600 04/17/11 0700 04/17/11 0250  CKTOTAL -- -- -- 179*  CKMB -- -- -- 1.9  CKMBINDEX -- -- -- --  TROPONINI <0.30 <0.30 <0.30 --   BNP:  Basename 04/17/11 0700  POCBNP 9405.0*   D-Dimer:  Flo Shanks 04/17/11 0335  DDIMER 2.70*   CBG: No results found for this basename: GLUCAP:6 in the last 72 hours Hemoglobin A1C: No results found for this basename: HGBA1C in the last 72 hours Fasting Lipid Panel:  Basename 04/17/11 0700  CHOL 136  HDL 22*  LDLCALC 91  TRIG 113  CHOLHDL 6.2    LDLDIRECT --    Studies/Results: Dg Chest 2 View  04/17/2011  *RADIOLOGY REPORT*  Clinical Data: Cough and fever.  CHEST - 2 VIEW  Comparison: 08/03/2010 radiographs.  Findings: There is stable cardiomegaly with chronic vascular congestion.  Compared with the prior examination, there is mildly increased patchy opacity at the right lung base without consolidation. There is no significant pleural effusion.  The osseous structures appear unchanged.  IMPRESSION:  1.  Stable chronic cardiomegaly and vascular congestion. 2.  Increased patchy opacity at the right lung base most likely reflects atelectasis, although early pneumonia cannot be excluded in this setting.  Original Report Authenticated By: Vivia Ewing, M.D.   Nm Pulmonary Perfusion  04/17/2011  *RADIOLOGY REPORT*  Clinical Data:  Chest pain and elevated D-dimer  NUCLEAR MEDICINE PERFUSION LUNG SCAN  Technique:  Perfusion images were obtained in multiple projections after intravenous injection of Tc-23m MAA.  Radiopharmaceuticals: 3.2  mCi Tc-53m MAA.  Comparison:  Chest radiograph from 04/17/2011 and VQ scan dated 08/04/2010  Findings: The perfusion portion of the examination is unchanged from 08/04/2010.  There are no medium or large size wedge shaped peripheral perfusion defects to suggest acute pulmonary embolism.  IMPRESSION:  1.  Low probability for acute pulmonary embolus.  Original Report Authenticated By: Angelita Ingles, M.D.  Medications: Scheduled Meds:    . amLODipine  5 mg Oral BID  . aspirin  325 mg Oral Daily  . azithromycin  500 mg Intravenous Q24H  . benzonatate  100 mg Oral TID  . carvedilol  25 mg Oral BID WC  . cefTRIAXone (ROCEPHIN)  IV  1 g Intravenous Q24H  . enoxaparin  40 mg Subcutaneous QHS  . furosemide  40 mg Oral BID  . hydrALAZINE  50 mg Oral Q8H  . potassium chloride  40 mEq Oral BID   Continuous Infusions:  PRN Meds:.albuterol, alum & mag hydroxide-simeth, guaiFENesin-dextromethorphan,  HYDROcodone-acetaminophen, ipratropium, morphine, ondansetron (ZOFRAN) IV, ondansetron, sodium chloride, technetium albumin aggregated  Assessment/Plan:  Pneumonia Sec to pneumonia Cont rocephin , zithromax. Wbc improving  Elevated D dimer V/Q scan is negative.  CHF 2D echo shows hypokinesis of inferior and lateral wall, and EF of 45%. Cardiology consult. Continue aspirin, Lasix, Coreg.  H/O Lupus Stable.  Hypertension Cont Coreg 25 mg Po bid  DVT Prophylaxis Lovenox.    LOS: 2 days   [facility] S Pager: [phone] 04/18/2011, 1:15 PM

## 2011-04-19 DIAGNOSIS — R079 Chest pain, unspecified: Secondary | ICD-10-CM

## 2011-04-19 LAB — BASIC METABOLIC PANEL
BUN: 20 mg/dL (ref 6–23)
Calcium: 9.3 mg/dL (ref 8.4–10.5)
Creatinine, Ser: 2.37 mg/dL — ABNORMAL HIGH (ref 0.50–1.10)
GFR calc non Af Amer: 21 mL/min — ABNORMAL LOW (ref >90)
Glucose, Bld: 121 mg/dL — ABNORMAL HIGH (ref 70–99)

## 2011-04-19 LAB — CULTURE, RESPIRATORY W GRAM STAIN: Culture: NORMAL

## 2011-04-19 MED ORDER — ENOXAPARIN SODIUM 30 MG/0.3ML ~~LOC~~ SOLN
30.0000 mg | SUBCUTANEOUS | Status: DC
  Administered 2011-04-19 – 2011-04-21 (×3): 30 mg via SUBCUTANEOUS
  Filled 2011-04-19 (×4): qty 0.3

## 2011-04-19 MED ORDER — AZITHROMYCIN 500 MG PO TABS
500.0000 mg | ORAL_TABLET | Freq: Every day | ORAL | Status: DC
  Administered 2011-04-20 – 2011-04-22 (×3): 500 mg via ORAL
  Filled 2011-04-19 (×4): qty 1

## 2011-04-19 NOTE — Progress Notes (Signed)
Patient ID: Brittany Charles, female   DOB: 1951-03-08, 60 y.o.   MRN: [id number] @ Subjective:  Denies SSCP, palpitations Cough with some SOB  Objective:  Filed Vitals:   04/18/11 2145 04/18/11 2146 04/19/11 0500 04/19/11 0537  BP: 139/78 139/78  119/71  Pulse:  84  75  Temp:  99.4 F (37.4 C)  98.6 F (37 C)  TempSrc:  Oral  Oral  Resp:  22  20  Height:      Weight:   84.4 kg (186 lb 1.1 oz) 84.4 kg (186 lb 1.1 oz)  SpO2:  94%  100%    Intake/Output from previous day:  Intake/Output Summary (Last 24 hours) at 04/19/11 0849 Last data filed at 04/19/11 0300  Gross per 24 hour  Intake   1383 ml  Output      0 ml  Net   1383 ml    Physical Exam: General appearance: alert and no distress Neck: no adenopathy, no carotid bruit, no JVD, supple, symmetrical, trachea midline and thyroid not enlarged, symmetric, no tenderness/mass/nodules Lungs: Expitory rhonchi and wheezing Heart: regular rate and rhythm, S1, S2 normal, no murmur, click, rub or gallop Abdomen: soft, non-tender; bowel sounds normal; no masses,  no organomegaly Extremities: extremities normal, atraumatic, no cyanosis or edema Pulses: 2+ and symmetric Neurologic: Grossly normal  Lab Results: Basic Metabolic Panel:  Basename 04/19/11 0530 04/18/11 0500  NA 136 136  K 3.7 3.3*  CL 102 104  CO2 23 24  GLUCOSE 121* 134*  BUN 20 19  CREATININE 2.37* 2.06*  CALCIUM 9.3 8.9  MG -- --  PHOS -- --   Liver Function Tests: No results found for this basename: AST:2,ALT:2,ALKPHOS:2,BILITOT:2,PROT:2,ALBUMIN:2 in the last 72 hours No results found for this basename: LIPASE:2,AMYLASE:2 in the last 72 hours CBC:  Basename 04/18/11 0500 04/17/11 0700 04/17/11 0053  WBC 12.6* 18.0* --  NEUTROABS -- -- 15.1*  HGB 9.7* 10.3* --  HCT 31.2* 32.1* --  MCV 80.4 80.7 --  PLT 477* 414* --   Cardiac Enzymes:  Basename 04/18/11 0035 04/17/11 1600 04/17/11 0700 04/17/11 0250  CKTOTAL -- -- -- 179*  CKMB -- -- -- 1.9    CKMBINDEX -- -- -- --  TROPONINI <0.30 <0.30 <0.30 --   BNP:  Basename 04/17/11 0700  POCBNP 9405.0*   D-Dimer:  Flo Shanks 04/17/11 0335  DDIMER 2.70*   Hemoglobin A1C: No results found for this basename: HGBA1C in the last 72 hours Fasting Lipid Panel:  Basename 04/17/11 0700  CHOL 136  HDL 22*  LDLCALC 91  TRIG 113  CHOLHDL 6.2  LDLDIRECT --    Imaging: Nm Pulmonary Perfusion  04/17/2011  *RADIOLOGY REPORT*  Clinical Data:  Chest pain and elevated D-dimer  NUCLEAR MEDICINE PERFUSION LUNG SCAN  Technique:  Perfusion images were obtained in multiple projections after intravenous injection of Tc-65m MAA.  Radiopharmaceuticals: 3.2  mCi Tc-50m MAA.  Comparison:  Chest radiograph from 04/17/2011 and VQ scan dated 08/04/2010  Findings: The perfusion portion of the examination is unchanged from 08/04/2010.  There are no medium or large size wedge shaped peripheral perfusion defects to suggest acute pulmonary embolism.  IMPRESSION:  1.  Low probability for acute pulmonary embolus.  Original Report Authenticated By: Angelita Ingles, M.D.    Cardiac Studies:  ECG:  Orders placed during the hospital encounter of 04/16/11  . EKG     Telemetry:  NSR no arrhythmia  Echo:  EF 45% inferobasal hypokinesis  12/7  Medications:     .  amLODipine  5 mg Oral BID  . aspirin  324 mg Oral Pre-Cath  . aspirin  325 mg Oral Daily  . azithromycin  500 mg Intravenous Q24H  . benzonatate  100 mg Oral TID  . carvedilol  25 mg Oral BID WC  . cefTRIAXone (ROCEPHIN)  IV  1 g Intravenous Q24H  . diazepam  2 mg Oral On Call  . enoxaparin  40 mg Subcutaneous QHS  . hydrALAZINE  50 mg Oral Q8H  . sodium chloride  3 mL Intravenous Q12H  . DISCONTD: aspirin  324 mg Oral Pre-Cath  . DISCONTD: diazepam  2 mg Oral On Call  . DISCONTD: furosemide  40 mg Oral BID  . DISCONTD: potassium chloride  20 mEq Oral BID       . sodium chloride      Assessment/Plan:  CHF:  With abnormal echo.  Currently  clinical picture that of URI.  Plan for right and left heart cath when Cr is stable  Continue beta blocker.  ON hydralazine Add nitrates for preload reduction especially since diuretic being held because of elevated Cr.  Would transfer to hospitalist service at [facility] to make cath and  Renal F/U easier to schedule/do.  Given elevated Cr will not schedule cath for Monday URI:  Continue antibiotic coverage add nebs and F/U CXR and BNP per primary service  Jenkins Rouge 04/19/2011, 8:49 AM

## 2011-04-19 NOTE — Progress Notes (Signed)
The patient is receiving Azithromycin by the intravenous route.  Based on criteria approved by the Pharmacy and [place], the medication is being converted to the equivalent oral dose form.  These criteria include: -No Active GI bleeding -Able to tolerate diet of full liquids (or better) or tube feeding -Able to tolerate other medications by the oral or enteral route  If you have any questions about this conversion, please contact the Pharmacy Department [phone]).  Thank you.   Gretta Arab PharmD  Pager [phone] 04/19/2011 11:40 AM

## 2011-04-19 NOTE — Progress Notes (Signed)
[facility] transferred patient to [facility]

## 2011-04-20 DIAGNOSIS — J189 Pneumonia, unspecified organism: Secondary | ICD-10-CM | POA: Diagnosis present

## 2011-04-20 DIAGNOSIS — I10 Essential (primary) hypertension: Secondary | ICD-10-CM

## 2011-04-20 LAB — CBC
MCH: 24.8 pg — ABNORMAL LOW (ref 26.0–34.0)
MCHC: 29.9 g/dL — ABNORMAL LOW (ref 30.0–36.0)
Platelets: 510 10*3/uL — ABNORMAL HIGH (ref 150–400)
RDW: 16.1 % — ABNORMAL HIGH (ref 11.5–15.5)

## 2011-04-20 LAB — URINE MICROSCOPIC-ADD ON

## 2011-04-20 LAB — BASIC METABOLIC PANEL
Calcium: 9.3 mg/dL (ref 8.4–10.5)
GFR calc non Af Amer: 23 mL/min — ABNORMAL LOW (ref >90)
Glucose, Bld: 154 mg/dL — ABNORMAL HIGH (ref 70–99)
Sodium: 137 mEq/L (ref 135–145)

## 2011-04-20 LAB — URINALYSIS, ROUTINE W REFLEX MICROSCOPIC
Hgb urine dipstick: NEGATIVE
Protein, ur: 30 mg/dL — AB
Urobilinogen, UA: 0.2 mg/dL (ref 0.0–1.0)

## 2011-04-20 NOTE — Progress Notes (Signed)
   CARE MANAGEMENT NOTE 04/20/2011  Patient:  Brittany Charles, Brittany Charles   Account Number:  [id number]  Date Initiated:  04/20/2011  Documentation initiated by:  Tomi Bamberger  Subjective/Objective Assessment:   dx chest pain,  admit- lives with family.     Action/Plan:   txfr from [facility] needs cath ( in renal failure)   Anticipated DC Date:  04/22/2011   Anticipated DC Plan:  [place]  CM consult      Choice offered to / List presented to:             Status of service:  In process, will continue to follow Medicare Important Message given?   (If response is "NO", the following Medicare IM given date fields will be blank) Date Medicare IM given:   Date Additional Medicare IM given:    Discharge Disposition:    Per UR Regulation:    Comments:  04/20/11 16: [place], BSN [phone] patient lives with family, transfer from [place], patient in ARF and needs cath.  NCM will continue to follow for dc needs.

## 2011-04-20 NOTE — Progress Notes (Signed)
PATIENT DETAILS Name: Brittany Charles Age: 60 y.o. Sex: female Date of Birth: Oct 26, 1950 Admit Date: 04/16/2011 PCP:No primary provider on file.  Subjective: No major complaints  Objective: Vital signs in last 24 hours: Filed Vitals:   04/20/11 0500 04/20/11 0948 04/20/11 1336 04/20/11 1359  BP: 130/71 129/69 108/75 113/67  Pulse: 76   72  Temp: 98.4 F (36.9 C)   98 F (36.7 C)  TempSrc: Oral   Oral  Resp: 18   18  Height:      Weight:      SpO2: 100%   99%    Weight change:   Body mass index is 30.96 kg/(m^2).  Intake/Output from previous day:  Intake/Output Summary (Last 24 hours) at 04/20/11 1744 Last data filed at 04/20/11 1400  Gross per 24 hour  Intake    480 ml  Output      0 ml  Net    480 ml    PHYSICAL EXAM: Gen Exam: Awake and alert with clear speech.   Neck: Supple, No JVD.   Chest: B/L Clear.   CVS: S1 S2 Regular, no murmurs.  Abdomen: soft, BS +, non tender, non distended.  Extremities: no edema, lower extremities warm to touch. Neurologic: Non Focal.   Skin: No Rash.   Wounds: N/A.    CONSULTS:  cardiology  LAB RESULTS: CBC  Lab 04/20/11 0500 04/18/11 0500 04/17/11 0700 04/17/11 0335 04/17/11 0103 04/17/11 0053  WBC 8.7 12.6* 18.0* 17.9* -- 18.5*  HGB 9.9* 9.7* 10.3* 11.0* 13.6 --  HCT 33.1* 31.2* 32.1* 33.9* 40.0 --  PLT 510* 477* 414* 470* -- 467*  MCV 82.8 80.4 80.7 80.9 -- 81.0  MCH 24.8* 25.0* 25.9* 26.3 -- 25.8*  MCHC 29.9* 31.1 32.1 32.4 -- 31.8  RDW 16.1* 15.7* 15.4 15.4 -- 15.4  LYMPHSABS -- -- -- -- -- 1.9  MONOABS -- -- -- -- -- 1.3*  EOSABS -- -- -- -- -- 0.0  BASOSABS -- -- -- -- -- 0.2*  BANDABS -- -- -- -- -- --    Chemistries   Lab 04/20/11 0500 04/19/11 0530 04/18/11 0500 04/17/11 0700 04/17/11 0335 04/17/11 0103  NA 137 136 136 135 -- 143  K 3.9 3.7 3.3* 3.2* -- 3.3*  CL 104 102 104 102 -- 106  CO2 24 23 24 22  -- --  GLUCOSE 154* 121* 134* 122* -- 155*  BUN 23 20 19 15  -- 16  CREATININE 2.21* 2.37*  2.06* 1.85* 1.74* --  CALCIUM 9.3 9.3 8.9 8.9 -- --  MG -- -- -- -- -- --    GFR Estimated Creatinine Clearance: 29.4 ml/min (by C-G formula based on Cr of 2.21).  Coagulation profile No results found for this basename: INR:5,PROTIME:5 in the last 168 hours  Cardiac Enzymes  Lab 04/18/11 0035 04/17/11 1600 04/17/11 0700 04/17/11 0250  CKMB -- -- -- 1.9  TROPONINI <0.30 <0.30 <0.30 --  MYOGLOBIN -- -- -- --    No components found with this basename: POCBNP:3 No results found for this basename: DDIMER:2 in the last 72 hours No results found for this basename: HGBA1C:2 in the last 72 hours No results found for this basename: CHOL:2,HDL:2,LDLCALC:2,TRIG:2,CHOLHDL:2,LDLDIRECT:2 in the last 72 hours No results found for this basename: TSH,T4TOTAL,FREET3,T3FREE,THYROIDAB in the last 72 hours No results found for this basename: VITAMINB12:2,FOLATE:2,FERRITIN:2,TIBC:2,IRON:2,RETICCTPCT:2 in the last 72 hours No results found for this basename: LIPASE:2,AMYLASE:2 in the last 72 hours  Urine Studies No results found for this basename: UACOL:2,UAPR:2,USPG:2,UPH:2,UTP:2,UGL:2,UKET:2,UBIL:2,UHGB:2,UNIT:2,UROB:2,ULEU:2,UEPI:2,UWBC:2,URBC:2,UBAC:2,CAST:2,CRYS:2,UCOM:2,BILUA:2  in the last 72 hours  MICROBIOLOGY: Recent Results (from the past 240 hour(s))  CULTURE, BLOOD (ROUTINE X 2)     Status: Normal (Preliminary result)   Collection Time   04/17/11  2:42 AM      Component Value Range Status Comment   Specimen Description BLOOD RIGHT ARM   Final    Special Requests BOTTLES DRAWN AEROBIC AND ANAEROBIC 2 CC EA   Final    Setup Time [id number]   Final    Culture     Final    Value:        BLOOD CULTURE RECEIVED NO GROWTH TO DATE CULTURE WILL BE HELD FOR 5 DAYS BEFORE ISSUING A FINAL NEGATIVE REPORT   Report Status PENDING   Incomplete   CULTURE, BLOOD (ROUTINE X 2)     Status: Normal (Preliminary result)   Collection Time   04/17/11  2:43 AM      Component Value Range Status Comment    Specimen Description BLOOD LEFT ANTECUBITAL   Final    Special Requests BOTTLES DRAWN AEROBIC AND ANAEROBIC 5 CC EA   Final    Setup Time [id number]   Final    Culture     Final    Value:        BLOOD CULTURE RECEIVED NO GROWTH TO DATE CULTURE WILL BE HELD FOR 5 DAYS BEFORE ISSUING A FINAL NEGATIVE REPORT   Report Status PENDING   Incomplete   CULTURE, RESPIRATORY     Status: Normal   Collection Time   04/17/11  6:30 AM      Component Value Range Status Comment   Specimen Description SPUTUM   Final    Special Requests NONE   Final    Gram Stain     Final    Value: RARE WBC PRESENT, PREDOMINANTLY MONONUCLEAR     NO SQUAMOUS EPITHELIAL CELLS SEEN     NO ORGANISMS SEEN   Culture NORMAL OROPHARYNGEAL FLORA   Final    Report Status 04/19/2011 FINAL   Final   CLOSTRIDIUM DIFFICILE BY PCR     Status: Normal   Collection Time   04/18/11  2:37 PM      Component Value Range Status Comment   C difficile by pcr NEGATIVE  NEGATIVE  Final     RADIOLOGY STUDIES/RESULTS: Dg Chest 2 View  04/17/2011  *RADIOLOGY REPORT*  Clinical Data: Cough and fever.  CHEST - 2 VIEW  Comparison: 08/03/2010 radiographs.  Findings: There is stable cardiomegaly with chronic vascular congestion.  Compared with the prior examination, there is mildly increased patchy opacity at the right lung base without consolidation. There is no significant pleural effusion.  The osseous structures appear unchanged.  IMPRESSION:  1.  Stable chronic cardiomegaly and vascular congestion. 2.  Increased patchy opacity at the right lung base most likely reflects atelectasis, although early pneumonia cannot be excluded in this setting.  Original Report Authenticated By: Vivia Ewing, M.D.   Nm Pulmonary Perfusion  04/17/2011  *RADIOLOGY REPORT*  Clinical Data:  Chest pain and elevated D-dimer  NUCLEAR MEDICINE PERFUSION LUNG SCAN  Technique:  Perfusion images were obtained in multiple projections after intravenous injection of Tc-30m MAA.   Radiopharmaceuticals: 3.2  mCi Tc-12m MAA.  Comparison:  Chest radiograph from 04/17/2011 and VQ scan dated 08/04/2010  Findings: The perfusion portion of the examination is unchanged from 08/04/2010.  There are no medium or large size wedge shaped peripheral perfusion defects to suggest acute pulmonary embolism.  IMPRESSION:  1.  Low probability for acute pulmonary embolus.  Original Report Authenticated By: Angelita Ingles, M.D.    MEDICATIONS: Scheduled Meds:   . amLODipine  5 mg Oral BID  . aspirin  324 mg Oral Pre-Cath  . aspirin  325 mg Oral Daily  . azithromycin  500 mg Oral Daily  . benzonatate  100 mg Oral TID  . carvedilol  25 mg Oral BID WC  . cefTRIAXone (ROCEPHIN)  IV  1 g Intravenous Q24H  . diazepam  2 mg Oral On Call  . enoxaparin (LOVENOX) injection  30 mg Subcutaneous Q24H  . hydrALAZINE  50 mg Oral Q8H  . sodium chloride  3 mL Intravenous Q12H   Continuous Infusions:   . sodium chloride     PRN Meds:.sodium chloride, albuterol, alum & mag hydroxide-simeth, guaiFENesin-dextromethorphan, HYDROcodone-acetaminophen, ipratropium, morphine, ondansetron (ZOFRAN) IV, ondansetron, sodium chloride, sodium chloride  Antibiotics: Anti-infectives     Start     Dose/Rate Route Frequency Ordered Stop   04/20/11 0600   azithromycin (ZITHROMAX) tablet 500 mg        500 mg Oral Daily 04/19/11 1139     04/17/11 0330   cefTRIAXone (ROCEPHIN) 1 g in dextrose 5 % 50 mL IVPB        1 g 100 mL/hr over 30 Minutes Intravenous Every 24 hours 04/17/11 0323     04/17/11 0330   azithromycin (ZITHROMAX) 500 mg in dextrose 5 % 250 mL IVPB  Status:  Discontinued        500 mg 250 mL/hr over 60 Minutes Intravenous Every 24 hours 04/17/11 0323 04/19/11 1137   04/17/11 0100   cefTRIAXone (ROCEPHIN) 1 g in dextrose 5 % 50 mL IVPB        1 g 100 mL/hr over 30 Minutes Intravenous  Once 04/17/11 0047 04/17/11 0151   04/17/11 0100   azithromycin (ZITHROMAX) tablet 500 mg        500 mg Oral   Once 04/17/11 0047 04/17/11 0120          Assessment/Plan: Patient Active Hospital Problem List: Chest pain    Assessment: cardiac enzymes negative.However has wall motion abnormality-hypokinesis in inf-lat wall-suspicion for underlying CAD(RCA territory)   Plan: Continue with Aspirin, and beta blockers.May need statins at some point  CHF-mild systolic dysfunction   Assessment:compensated   Plan: watch for decompensation while on IVF.Continue with coreg. Not a candidate for ACEI given CKD PNA (pneumonia)    Assessment: afebrile, leukocytosis resolved-likely community acquired   Plan: discontinue antibiotics after 5 days of therapy  Acute on CKD-stage III   Assessment: not sure if this is lupus nephropathy-no proteinuria seen on prior UA    Plan: check UA, get a renal ultrasound.(prior abdominal ultrasound-showed normal kidneys)  Hypertension    Assessment: controlled   Plan: continue with amlodipine and coreg.  Disposition: Remain inpatient  DVT Prophylaxis: Subcutaneous Lovenox  Code Status: Full Code  Henreitta Leber Mariza Bourget,MD. 04/20/2011, 5:44 PM

## 2011-04-20 NOTE — Progress Notes (Signed)
Utilization review completed.  

## 2011-04-20 NOTE — Progress Notes (Signed)
Subjective:   Brittany Charles is a 60 year old female with a history of lupus and secondary renal insufficiency and mild pulmonary hypertension. She was recently diagnosed as having congestive heart failure and her echocardiogram revealed an ejection fraction of 45%. She was found to have hypokinesis of the inferior wall. She was also found to have an estimated pulmonary artery pressure 35 mm mercury.  She still has a little cough. She's not had any episodes of chest pain or shortness of breath. She was very comfortable lying in bed. She has a long history of renal insufficiency secondary to her lupus.  She does not have a general medical Dr. but goes to urgent care when she needs to.      Marland Kitchen amLODipine  5 mg Oral BID  . aspirin  324 mg Oral Pre-Cath  . aspirin  325 mg Oral Daily  . azithromycin  500 mg Oral Daily  . benzonatate  100 mg Oral TID  . carvedilol  25 mg Oral BID WC  . cefTRIAXone (ROCEPHIN)  IV  1 g Intravenous Q24H  . diazepam  2 mg Oral On Call  . enoxaparin (LOVENOX) injection  30 mg Subcutaneous Q24H  . hydrALAZINE  50 mg Oral Q8H  . sodium chloride  3 mL Intravenous Q12H  . DISCONTD: azithromycin  500 mg Intravenous Q24H  . DISCONTD: enoxaparin  40 mg Subcutaneous QHS      . sodium chloride      Objective:  Vital Signs in the last 24 hours: Blood pressure 130/71, pulse 76, temperature 98.4 F (36.9 C), temperature source Oral, resp. rate 18, height 5\' 5"  (1.651 m), weight 186 lb 1.1 oz (84.4 kg), SpO2 100.00%. Temp:  [98.2 F (36.8 C)-98.5 F (36.9 C)] 98.4 F (36.9 C) (12/10 0500) Pulse Rate:  [70-76] 76  (12/10 0500) Resp:  [18-20] 18  (12/10 0500) BP: (111-130)/(67-71) 130/71 mmHg (12/10 0500) SpO2:  [98 %-100 %] 100 % (12/10 0500)  Intake/Output from previous day: 12/09 0701 - 12/10 0700 In: 363 [P.O.:360; I.V.:3] Out: -  Intake/Output from this shift:    Physical Exam: The patient is alert and oriented x 3.  The mood and affect are normal.   Skin:  warm and dry.  Color is normal.    HEENT:   the sclera are nonicteric.  The mucous membranes are moist.  The carotids are 2+ without bruits.  There is no thyromegaly.  There is no JVD.    Lungs: clear.  The chest wall is non tender.    Heart: regular rate with a normal S1 and S2.  There are no murmurs, gallops, or rubs. The PMI is not displaced.     Abdomen: good bowel sounds.  There is no guarding or rebound.  There is no hepatosplenomegaly or tenderness.  There are no masses.   Extremities:  no clubbing, cyanosis, or edema.  The legs are without rashes.  The distal pulses are intact.   Neuro:  Cranial nerves II - XII are intact.  Motor and sensory functions are intact.     Lab Results:  Basename 04/18/11 0500  WBC 12.6*  HGB 9.7*  PLT 477*    Basename 04/19/11 0530 04/18/11 0500  NA 136 136  K 3.7 3.3*  CL 102 104  CO2 23 24  GLUCOSE 121* 134*  BUN 20 19  CREATININE 2.37* 2.06*    Basename 04/18/11 0035 04/17/11 1600  TROPONINI <0.30 <0.30   No results found for this basename: BNP  in the last 72 hours Hepatic Function Panel No results found for this basename: PROT,ALBUMIN,AST,ALT,ALKPHOS,BILITOT,BILIDIR,IBILI in the last 72 hours Lab Results  Component Value Date   CHOL 136 04/17/2011   HDL 22* 04/17/2011   LDLCALC 91 04/17/2011   TRIG 113 04/17/2011   CHOLHDL 6.2 04/17/2011   No results found for this basename: INR in the last 72 hours  Tele: Normal sinus rhythm   Assessment/Plan:   1. Congestive heart failure: The patient is now diagnosed with congestive heart failure with an ejection fraction of 45%. She also was found to have mild pulmonary hypertension. I suspect that her pulmonary hypertension is due to lupus.  The plan is to proceed with cardiac catheterization was her renal function is stable. Her creatinine is still pending this morning.  Her troponin levels have been negative.  2. Pulmonary hypertension:  She was also found to have an elevated d-dimer  level.   She had a VQ scan which was negative for pulmonary embolus. I suspect that her pulmonary hypertension is due to her lupus.  3. Renal insufficiency: Her creatinine was up slightly yesterday. We'll need for her creatinine stabilized before we can proceed with cardiac catheterization.   Thayer Headings, Brooke Bonito., MD, [facility] 04/20/2011, 7:58 AM LOS: Day 4

## 2011-04-21 ENCOUNTER — Inpatient Hospital Stay (HOSPITAL_COMMUNITY): Payer: Medicaid Other

## 2011-04-21 ENCOUNTER — Encounter (HOSPITAL_COMMUNITY)
Admission: EM | Disposition: A | Discharge status: Home / Self Care | Payer: Self-pay | Source: Home / Self Care | Attending: Internal Medicine

## 2011-04-21 DIAGNOSIS — I2510 Atherosclerotic heart disease of native coronary artery without angina pectoris: Secondary | ICD-10-CM

## 2011-04-21 HISTORY — PX: LEFT AND RIGHT HEART CATHETERIZATION WITH CORONARY ANGIOGRAM: SHX5449

## 2011-04-21 LAB — POCT I-STAT 3, ART BLOOD GAS (G3+)
Bicarbonate: 22.9 mEq/L (ref 20.0–24.0)
TCO2: 24 mmol/L (ref 0–100)
pH, Arterial: 7.392 (ref 7.350–7.400)
pO2, Arterial: 62 mmHg — ABNORMAL LOW (ref 80.0–100.0)

## 2011-04-21 LAB — RENAL FUNCTION PANEL
Calcium: 8.5 mg/dL (ref 8.4–10.5)
GFR calc Af Amer: 29 mL/min — ABNORMAL LOW (ref >90)
Glucose, Bld: 111 mg/dL — ABNORMAL HIGH (ref 70–99)
Phosphorus: 4.3 mg/dL (ref 2.3–4.6)
Sodium: 137 mEq/L (ref 135–145)

## 2011-04-21 LAB — POCT I-STAT 3, VENOUS BLOOD GAS (G3P V)
Acid-base deficit: 5 mmol/L — ABNORMAL HIGH (ref 0.0–2.0)
Bicarbonate: 20.5 mEq/L (ref 20.0–24.0)
O2 Saturation: 66 %
TCO2: 22 mmol/L (ref 0–100)
pO2, Ven: 36 mmHg (ref 30.0–45.0)

## 2011-04-21 LAB — PROTIME-INR
INR: 1.18 (ref 0.00–1.49)
Prothrombin Time: 15.3 seconds — ABNORMAL HIGH (ref 11.6–15.2)

## 2011-04-21 SURGERY — LEFT AND RIGHT HEART CATHETERIZATION WITH CORONARY ANGIOGRAM
Anesthesia: LOCAL | Laterality: Right

## 2011-04-21 MED ORDER — SODIUM CHLORIDE 0.9 % IV SOLN
INTRAVENOUS | Status: AC
  Administered 2011-04-21: 14:00:00 via INTRAVENOUS

## 2011-04-21 MED ORDER — LIDOCAINE HCL (PF) 1 % IJ SOLN
INTRAMUSCULAR | Status: AC
  Filled 2011-04-21: qty 30

## 2011-04-21 MED ORDER — DIAZEPAM 5 MG PO TABS
5.0000 mg | ORAL_TABLET | ORAL | Status: AC
  Administered 2011-04-21: 5 mg via ORAL
  Filled 2011-04-21: qty 1

## 2011-04-21 MED ORDER — SODIUM CHLORIDE 0.9 % IJ SOLN: 3.0000 mL | INTRAMUSCULAR | Status: DC | PRN

## 2011-04-21 MED ORDER — SODIUM CHLORIDE 0.9 % IJ SOLN: 3.0000 mL | Freq: Two times a day (BID) | INTRAMUSCULAR | Status: DC

## 2011-04-21 MED ORDER — ONDANSETRON HCL 4 MG/2ML IJ SOLN: 4.0000 mg | Freq: Four times a day (QID) | INTRAMUSCULAR | Status: DC | PRN

## 2011-04-21 MED ORDER — ASPIRIN 81 MG PO CHEW
324.0000 mg | CHEWABLE_TABLET | ORAL | Status: AC
  Administered 2011-04-21: 324 mg via ORAL
  Filled 2011-04-21: qty 4

## 2011-04-21 MED ORDER — ACETAMINOPHEN 325 MG PO TABS
650.0000 mg | ORAL_TABLET | ORAL | Status: DC | PRN
  Administered 2011-04-21: 650 mg via ORAL
  Filled 2011-04-21: qty 2

## 2011-04-21 MED ORDER — SODIUM CHLORIDE 0.9 % IV SOLN: 250.0000 mL | INTRAVENOUS | Status: DC | PRN

## 2011-04-21 MED ORDER — HEPARIN (PORCINE) IN NACL 2-0.9 UNIT/ML-% IJ SOLN
INTRAMUSCULAR | Status: AC
  Filled 2011-04-21: qty 2000

## 2011-04-21 MED ORDER — SODIUM CHLORIDE 0.9 % IV SOLN: 1.0000 mL/kg/h | INTRAVENOUS | Status: DC

## 2011-04-21 NOTE — Procedures (Signed)
Cardiac Catheterization Procedure Note  Name: Brittany Charles MRN: [id number] DOB: 17-Oct-1950  Procedure: Right Heart Cath, Left Heart Cath, Selective Coronary Angiography, LV angiography  Indication: Congestive heart failure with segmental left ventricular dysfunction   Procedural Details: The right groin was prepped, draped, and anesthetized with 1% lidocaine. Using the modified Seldinger technique a 5 French sheath was placed in the right femoral artery and a 7 French sheath was placed in the right femoral vein. A Swan-Ganz catheter was used for the right heart catheterization. Standard protocol was followed for recording of right heart pressures and sampling of oxygen saturations. Fick cardiac output was calculated. Standard Judkins catheters were used for selective coronary angiography. Left ventriculography was deferred because of renal insufficiency. There were no immediate procedural complications. The patient was transferred to the post catheterization recovery area for further monitoring.  A total of 30 cc of iodinated contrast was utilized.  Procedural Findings: Hemodynamics RA 10 RV 49/13 PA 50/24 with a mean of 34 PCWP 16 LV 118/20 AO 117/58 with a mean of 80  Oxygen saturations: PA 66% AO 91%  Cardiac Output (Fick) 7.5 L per minute  Cardiac Index (Fick) 3.95 L per minute per meter square   Coronary angiography: Coronary dominance: right  Left mainstem: Patent with no significant obstructive disease. Divides into the LAD and left circumflex.  Left anterior descending (LAD): Mild nonobstructive stenosis in the mid LAD after the first diagonal branch. Estimated stenosis is 30-40%. Further segmental stenosis further down in the mid LAD estimated at 50%. The LAD wraps around the left ventricular apex and the distal LAD is free of significant stenosis.  Left circumflex (LCx): Large caliber vessel without significant stenosis. The vessel is smooth throughout its course. It  supplies 2 obtuse marginal branches.  Right coronary artery (RCA): This is a dominant vessel with no significant stenosis. It supplies an RV marginal branch. It supplies a PDA and a posterolateral branch.  Left ventriculography: Left ventricular systolic function is normal, LVEF is estimated at 55-65%, there is no significant mitral regurgitation   Final Conclusions:   1. Mild pulmonary hypertension 2. Mild to moderate LAD stenosis 3. No significant right coronary artery or left circumflex stenoses 4. Normal cardiac output  Recommendations: Medical therapy for nonobstructive coronary artery disease. Continue treatment of congestive heart failure, but hemodynamics suggest adequate diuresis with normal pulmonary capillary wedge pressure.   [name] 04/21/2011, 11:39 AM

## 2011-04-21 NOTE — Interval H&P Note (Signed)
History and Physical Interval Note:  04/21/2011 11:05 AM  Brittany Charles  has presented today for surgery, with the diagnosis of Chest pain and CHF  The various methods of treatment have been discussed with the patient and family. After consideration of risks, benefits and other options for treatment, the patient has consented to  Procedure(s): LEFT AND RIGHT HEART CATHETERIZATION WITH CORONARY ANGIOGRAM as a surgical intervention .  The patients' history has been reviewed, patient examined, no change in status, stable for surgery.  I have reviewed the patients' chart and labs.  Questions were answered to the patient's satisfaction.     [name]

## 2011-04-21 NOTE — Progress Notes (Signed)
Utilization review completed.  

## 2011-04-21 NOTE — H&P (View-Only) (Signed)
Subjective:   Brittany Charles is a 60 year old female with a history of lupus and secondary renal insufficiency and mild pulmonary hypertension. She was recently diagnosed as having congestive heart failure and her echocardiogram revealed an ejection fraction of 45%. She was found to have hypokinesis of the inferior wall. She was also found to have an estimated pulmonary artery pressure 35 mm mercury.  She still has a little cough. She's not had any episodes of chest pain or shortness of breath. She was very comfortable lying in bed. She has a long history of renal insufficiency secondary to her lupus.  She does not have a general medical Dr. but goes to urgent care when she needs to.      Marland Kitchen amLODipine  5 mg Oral BID  . aspirin  324 mg Oral Pre-Cath  . aspirin  325 mg Oral Daily  . azithromycin  500 mg Oral Daily  . benzonatate  100 mg Oral TID  . carvedilol  25 mg Oral BID WC  . cefTRIAXone (ROCEPHIN)  IV  1 g Intravenous Q24H  . diazepam  2 mg Oral On Call  . enoxaparin (LOVENOX) injection  30 mg Subcutaneous Q24H  . hydrALAZINE  50 mg Oral Q8H  . sodium chloride  3 mL Intravenous Q12H  . DISCONTD: azithromycin  500 mg Intravenous Q24H  . DISCONTD: enoxaparin  40 mg Subcutaneous QHS      . sodium chloride      Objective:  Vital Signs in the last 24 hours: Blood pressure 130/71, pulse 76, temperature 98.4 F (36.9 C), temperature source Oral, resp. rate 18, height 5\' 5"  (1.651 m), weight 186 lb 1.1 oz (84.4 kg), SpO2 100.00%. Temp:  [98.2 F (36.8 C)-98.5 F (36.9 C)] 98.4 F (36.9 C) (12/10 0500) Pulse Rate:  [70-76] 76  (12/10 0500) Resp:  [18-20] 18  (12/10 0500) BP: (111-130)/(67-71) 130/71 mmHg (12/10 0500) SpO2:  [98 %-100 %] 100 % (12/10 0500)  Intake/Output from previous day: 12/09 0701 - 12/10 0700 In: 363 [P.O.:360; I.V.:3] Out: -  Intake/Output from this shift:    Physical Exam: The patient is alert and oriented x 3.  The mood and affect are normal.   Skin:  warm and dry.  Color is normal.    HEENT:   the sclera are nonicteric.  The mucous membranes are moist.  The carotids are 2+ without bruits.  There is no thyromegaly.  There is no JVD.    Lungs: clear.  The chest wall is non tender.    Heart: regular rate with a normal S1 and S2.  There are no murmurs, gallops, or rubs. The PMI is not displaced.     Abdomen: good bowel sounds.  There is no guarding or rebound.  There is no hepatosplenomegaly or tenderness.  There are no masses.   Extremities:  no clubbing, cyanosis, or edema.  The legs are without rashes.  The distal pulses are intact.   Neuro:  Cranial nerves II - XII are intact.  Motor and sensory functions are intact.     Lab Results:  Basename 04/18/11 0500  WBC 12.6*  HGB 9.7*  PLT 477*    Basename 04/19/11 0530 04/18/11 0500  NA 136 136  K 3.7 3.3*  CL 102 104  CO2 23 24  GLUCOSE 121* 134*  BUN 20 19  CREATININE 2.37* 2.06*    Basename 04/18/11 0035 04/17/11 1600  TROPONINI <0.30 <0.30   No results found for this basename: BNP  in the last 72 hours Hepatic Function Panel No results found for this basename: PROT,ALBUMIN,AST,ALT,ALKPHOS,BILITOT,BILIDIR,IBILI in the last 72 hours Lab Results  Component Value Date   CHOL 136 04/17/2011   HDL 22* 04/17/2011   LDLCALC 91 04/17/2011   TRIG 113 04/17/2011   CHOLHDL 6.2 04/17/2011   No results found for this basename: INR in the last 72 hours  Tele: Normal sinus rhythm   Assessment/Plan:   1. Congestive heart failure: The patient is now diagnosed with congestive heart failure with an ejection fraction of 45%. She also was found to have mild pulmonary hypertension. I suspect that her pulmonary hypertension is due to lupus.  The plan is to proceed with cardiac catheterization was her renal function is stable. Her creatinine is still pending this morning.  Her troponin levels have been negative.  2. Pulmonary hypertension:  She was also found to have an elevated d-dimer  level.   She had a VQ scan which was negative for pulmonary embolus. I suspect that her pulmonary hypertension is due to her lupus.  3. Renal insufficiency: Her creatinine was up slightly yesterday. We'll need for her creatinine stabilized before we can proceed with cardiac catheterization.   Thayer Headings, Brooke Bonito., MD, [facility] 04/20/2011, 7:58 AM LOS: Day 4

## 2011-04-21 NOTE — Progress Notes (Signed)
PATIENT DETAILS Name: Brittany Charles Age: 60 y.o. Sex: female Date of Birth: 1950-05-28 Admit Date: 04/16/2011 PCP:No primary provider on file.  Subjective: No major complaints-status post left heart catheterization.  Objective: Vital signs in last 24 hours: Filed Vitals:   04/21/11 0554 04/21/11 0948 04/21/11 1101 04/21/11 1350  BP: 126/69 146/72  138/71  Pulse: 89 72 72 67  Temp: 98.2 F (36.8 C) 98.2 F (36.8 C)  98.1 F (36.7 C)  TempSrc: Oral Oral  Oral  Resp: 18 20  18   Height:      Weight: 83.1 kg (183 lb 3.2 oz)     SpO2: 99% 99%  98%    Weight change:   Body mass index is 30.49 kg/(m^2).  Intake/Output from previous day:  Intake/Output Summary (Last 24 hours) at 04/21/11 1625 Last data filed at 04/21/11 1100  Gross per 24 hour  Intake    240 ml  Output      0 ml  Net    240 ml    PHYSICAL EXAM: Gen Exam: Awake and alert with clear speech.   Neck: Supple, No JVD.   Chest: B/L Clear.   CVS: S1 S2 Regular, no murmurs.  Abdomen: soft, BS +, non tender, non distended.  Extremities: no edema, lower extremities warm to touch. Neurologic: Non Focal.   Skin: No Rash.   Wounds: N/A.    CONSULTS:  cardiology  LAB RESULTS: CBC  Lab 04/20/11 0500 04/18/11 0500 04/17/11 0700 04/17/11 0335 04/17/11 0103 04/17/11 0053  WBC 8.7 12.6* 18.0* 17.9* -- 18.5*  HGB 9.9* 9.7* 10.3* 11.0* 13.6 --  HCT 33.1* 31.2* 32.1* 33.9* 40.0 --  PLT 510* 477* 414* 470* -- 467*  MCV 82.8 80.4 80.7 80.9 -- 81.0  MCH 24.8* 25.0* 25.9* 26.3 -- 25.8*  MCHC 29.9* 31.1 32.1 32.4 -- 31.8  RDW 16.1* 15.7* 15.4 15.4 -- 15.4  LYMPHSABS -- -- -- -- -- 1.9  MONOABS -- -- -- -- -- 1.3*  EOSABS -- -- -- -- -- 0.0  BASOSABS -- -- -- -- -- 0.2*  BANDABS -- -- -- -- -- --    Chemistries   Lab 04/21/11 0530 04/20/11 0500 04/19/11 0530 04/18/11 0500 04/17/11 0700  NA 137 137 136 136 135  K 3.9 3.9 3.7 3.3* 3.2*  CL 104 104 102 104 102  CO2 23 24 23 24 22   GLUCOSE 111* 154* 121*  134* 122*  BUN 23 23 20 19 15   CREATININE 2.08* 2.21* 2.37* 2.06* 1.85*  CALCIUM 8.5 9.3 9.3 8.9 8.9  MG -- -- -- -- --    GFR Estimated Creatinine Clearance: 31 ml/min (by C-G formula based on Cr of 2.08).  Coagulation profile  Lab 04/21/11 0530  INR 1.18  PROTIME --    Cardiac Enzymes  Lab 04/18/11 0035 04/17/11 1600 04/17/11 0700 04/17/11 0250  CKMB -- -- -- 1.9  TROPONINI <0.30 <0.30 <0.30 --  MYOGLOBIN -- -- -- --    No components found with this basename: POCBNP:3 No results found for this basename: DDIMER:2 in the last 72 hours No results found for this basename: HGBA1C:2 in the last 72 hours No results found for this basename: CHOL:2,HDL:2,LDLCALC:2,TRIG:2,CHOLHDL:2,LDLDIRECT:2 in the last 72 hours No results found for this basename: TSH,T4TOTAL,FREET3,T3FREE,THYROIDAB in the last 72 hours No results found for this basename: VITAMINB12:2,FOLATE:2,FERRITIN:2,TIBC:2,IRON:2,RETICCTPCT:2 in the last 72 hours No results found for this basename: LIPASE:2,AMYLASE:2 in the last 72 hours  Urine Studies No results found for this basename:  UACOL:2,UAPR:2,USPG:2,UPH:2,UTP:2,UGL:2,UKET:2,UBIL:2,UHGB:2,UNIT:2,UROB:2,ULEU:2,UEPI:2,UWBC:2,URBC:2,UBAC:2,CAST:2,CRYS:2,UCOM:2,BILUA:2 in the last 72 hours  MICROBIOLOGY: Recent Results (from the past 240 hour(s))  CULTURE, BLOOD (ROUTINE X 2)     Status: Normal (Preliminary result)   Collection Time   04/17/11  2:42 AM      Component Value Range Status Comment   Specimen Description BLOOD RIGHT ARM   Final    Special Requests BOTTLES DRAWN AEROBIC AND ANAEROBIC 2 CC EA   Final    Setup Time [id number]   Final    Culture     Final    Value:        BLOOD CULTURE RECEIVED NO GROWTH TO DATE CULTURE WILL BE HELD FOR 5 DAYS BEFORE ISSUING A FINAL NEGATIVE REPORT   Report Status PENDING   Incomplete   CULTURE, BLOOD (ROUTINE X 2)     Status: Normal (Preliminary result)   Collection Time   04/17/11  2:43 AM      Component Value Range  Status Comment   Specimen Description BLOOD LEFT ANTECUBITAL   Final    Special Requests BOTTLES DRAWN AEROBIC AND ANAEROBIC 5 CC EA   Final    Setup Time [id number]   Final    Culture     Final    Value:        BLOOD CULTURE RECEIVED NO GROWTH TO DATE CULTURE WILL BE HELD FOR 5 DAYS BEFORE ISSUING A FINAL NEGATIVE REPORT   Report Status PENDING   Incomplete   CULTURE, RESPIRATORY     Status: Normal   Collection Time   04/17/11  6:30 AM      Component Value Range Status Comment   Specimen Description SPUTUM   Final    Special Requests NONE   Final    Gram Stain     Final    Value: RARE WBC PRESENT, PREDOMINANTLY MONONUCLEAR     NO SQUAMOUS EPITHELIAL CELLS SEEN     NO ORGANISMS SEEN   Culture NORMAL OROPHARYNGEAL FLORA   Final    Report Status 04/19/2011 FINAL   Final   CLOSTRIDIUM DIFFICILE BY PCR     Status: Normal   Collection Time   04/18/11  2:37 PM      Component Value Range Status Comment   C difficile by pcr NEGATIVE  NEGATIVE  Final     RADIOLOGY STUDIES/RESULTS: Dg Chest 2 View  04/17/2011  *RADIOLOGY REPORT*  Clinical Data: Cough and fever.  CHEST - 2 VIEW  Comparison: 08/03/2010 radiographs.  Findings: There is stable cardiomegaly with chronic vascular congestion.  Compared with the prior examination, there is mildly increased patchy opacity at the right lung base without consolidation. There is no significant pleural effusion.  The osseous structures appear unchanged.  IMPRESSION:  1.  Stable chronic cardiomegaly and vascular congestion. 2.  Increased patchy opacity at the right lung base most likely reflects atelectasis, although early pneumonia cannot be excluded in this setting.  Original Report Authenticated By: Vivia Ewing, M.D.   Nm Pulmonary Perfusion  04/17/2011  *RADIOLOGY REPORT*  Clinical Data:  Chest pain and elevated D-dimer  NUCLEAR MEDICINE PERFUSION LUNG SCAN  Technique:  Perfusion images were obtained in multiple projections after intravenous  injection of Tc-32m MAA.  Radiopharmaceuticals: 3.2  mCi Tc-107m MAA.  Comparison:  Chest radiograph from 04/17/2011 and VQ scan dated 08/04/2010  Findings: The perfusion portion of the examination is unchanged from 08/04/2010.  There are no medium or large size wedge shaped peripheral perfusion defects to suggest acute pulmonary embolism.  IMPRESSION:  1.  Low probability for acute pulmonary embolus.  Original Report Authenticated By: Angelita Ingles, M.D.    MEDICATIONS: Scheduled Meds:    . amLODipine  5 mg Oral BID  . aspirin  324 mg Oral Pre-Cath  . aspirin  324 mg Oral Pre-Cath  . aspirin  325 mg Oral Daily  . azithromycin  500 mg Oral Daily  . benzonatate  100 mg Oral TID  . carvedilol  25 mg Oral BID WC  . cefTRIAXone (ROCEPHIN)  IV  1 g Intravenous Q24H  . diazepam  2 mg Oral On Call  . diazepam  5 mg Oral On Call  . enoxaparin (LOVENOX) injection  30 mg Subcutaneous Q24H  . heparin      . hydrALAZINE  50 mg Oral Q8H  . lidocaine      . sodium chloride  3 mL Intravenous Q12H  . DISCONTD: sodium chloride  3 mL Intravenous Q12H   Continuous Infusions:    . sodium chloride    . sodium chloride 100 mL/hr at 04/21/11 1405  . DISCONTD: sodium chloride     PRN Meds:.sodium chloride, acetaminophen, albuterol, alum & mag hydroxide-simeth, guaiFENesin-dextromethorphan, HYDROcodone-acetaminophen, ipratropium, morphine, ondansetron (ZOFRAN) IV, ondansetron (ZOFRAN) IV, ondansetron, sodium chloride, sodium chloride, DISCONTD: sodium chloride, DISCONTD: sodium chloride  Antibiotics: Anti-infectives     Start     Dose/Rate Route Frequency Ordered Stop   04/20/11 0600   azithromycin (ZITHROMAX) tablet 500 mg        500 mg Oral Daily 04/19/11 1139     04/17/11 0330   cefTRIAXone (ROCEPHIN) 1 g in dextrose 5 % 50 mL IVPB        1 g 100 mL/hr over 30 Minutes Intravenous Every 24 hours 04/17/11 0323     04/17/11 0330   azithromycin (ZITHROMAX) 500 mg in dextrose 5 % 250 mL IVPB   Status:  Discontinued        500 mg 250 mL/hr over 60 Minutes Intravenous Every 24 hours 04/17/11 0323 04/19/11 1137   04/17/11 0100   cefTRIAXone (ROCEPHIN) 1 g in dextrose 5 % 50 mL IVPB        1 g 100 mL/hr over 30 Minutes Intravenous  Once 04/17/11 0047 04/17/11 0151   04/17/11 0100   azithromycin (ZITHROMAX) tablet 500 mg        500 mg Oral  Once 04/17/11 0047 04/17/11 0120          Assessment/Plan: Patient Active Hospital Problem List: Chest pain    Assessment: cardiac enzymes negative.cardiac cath done today does not show significant coronary artery disease.   Plan: Continue with Aspirin, and beta blockers.May need statins at some point  CHF-mild systolic dysfunction   Assessment:compensated   Plan: watch for decompensation while on IVF.Continue with coreg. Not a candidate for ACEI given CKD  PNA (pneumonia)    Assessment: afebrile, leukocytosis resolved-likely community acquired   Plan: discontinue antibiotics after 5 days of therapy  Acute on CKD-stage III   Assessment: No proteinuria seen in urinalysis, doubt lupus nephropathy. Does have evidence of chronic renal disease on ultrasound of the kidneys. Watch for further deterioration in renal function as the patient is status post left heart cath.    Plan: Continue with hydration post, we'll check a renal panel in the morning. If no significant change can discharge the patient tomorrow morning.  Hypertension    Assessment: controlled   Plan: continue with amlodipine and coreg.  Disposition: Remain inpatient-possible discharge  tomorrow morning if no evidence of contrast-induced nephropathy.  DVT Prophylaxis: Subcutaneous Lovenox  Code Status: Full Code  Henreitta Leber GHIMIRE,MD. 04/21/2011, 4:25 PM

## 2011-04-22 DIAGNOSIS — R0602 Shortness of breath: Secondary | ICD-10-CM | POA: Diagnosis present

## 2011-04-22 DIAGNOSIS — N185 Chronic kidney disease, stage 5: Secondary | ICD-10-CM | POA: Diagnosis present

## 2011-04-22 DIAGNOSIS — R0989 Other specified symptoms and signs involving the circulatory and respiratory systems: Secondary | ICD-10-CM

## 2011-04-22 LAB — BASIC METABOLIC PANEL
CO2: 23 mEq/L (ref 19–32)
Chloride: 108 mEq/L (ref 96–112)
Creatinine, Ser: 1.84 mg/dL — ABNORMAL HIGH (ref 0.50–1.10)
GFR calc Af Amer: 34 mL/min — ABNORMAL LOW (ref >90)
Potassium: 4.1 mEq/L (ref 3.5–5.1)

## 2011-04-22 MED ORDER — HYDROCODONE-ACETAMINOPHEN 5-325 MG PO TABS: 1.0000 | ORAL_TABLET | ORAL | Status: AC | PRN

## 2011-04-22 NOTE — Discharge Summary (Signed)
HOSPITAL DISCHARGE SUMMARY  Brittany Charles  MRN: [id number]  DOB:[birth year]  Date of Admission: 04/16/2011 Date of Discharge: 04/22/2011         LOS: 6 days   Attending Physician:Dashaun Onstott A  Patient's PCP:  To be set up with [facility] Consults: Kirk Ruths  Discharge Diagnoses: Present on Admission:  .Cough .Chest pain .Hypertension .Gout .PNA (pneumonia) .Shortness of breath .CKD (chronic kidney disease), stage III   Current Discharge Medication List    START taking these medications   Details  HYDROcodone-acetaminophen (NORCO) 5-325 MG per tablet Take 1-2 tablets by mouth every 4 (four) hours as needed. Qty: 30 tablet, Refills: 0      CONTINUE these medications which have NOT CHANGED   Details  amLODipine (NORVASC) 5 MG tablet Take 5 mg by mouth 2 (two) times daily.      carvedilol (COREG) 25 MG tablet Take 25 mg by mouth 2 (two) times daily with a meal.      furosemide (LASIX) 40 MG tablet Take 40 mg by mouth 2 (two) times daily.      hydrALAZINE (APRESOLINE) 50 MG tablet Take 50 mg by mouth 2 (two) times daily.      potassium chloride SA (K-DUR,KLOR-CON) 20 MEQ tablet Take 40 mEq by mouth 2 (two) times daily.            Brief Admission History: This is a 61 year old female who states that on Sunday she began having a hacking cough. This hacking cough continues until today. Cough productive of whitish sputum. She developed shortness of breath, no wheezing, patient states she could hardly catch her breath between her cough and shortness of breath. She reports no fevers, no chills. She did have posttussive emesis, no evidence of blood. Yesterday she developed abdominal pain [generalized] and diarrhea. No blood in her stool. Today while taking out the trash she developed centralized chest pain. She became light headed and had to sit. She had no loss of consciousness. she has not been eating or drinking over the past 2 days and she's been feeling weak.  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 she has no cardiac history, states chest pain is not reproducible. She went to urgent care and was sent here to the ER. During my interview patient was coughing continuously and bringing up white-colored sputum. History obtained from patient. She has been on no recent antibiotics.Here in our ER patient has a low-grade fever.   Hospital Course: Present on Admission:  .Cough .Chest pain .Hypertension .Gout .PNA (pneumonia) .Shortness of breath .CKD (chronic kidney disease), stage III:  1. Chest pain: Patient admitted to the hospital for substernal chest pain, at the time of admission she has a negative cardiac enzymes. Cardiology was consulted. And now discussion was made for cardiac catheterization. Because patient mild renal sufficiency patient was transferred [facility] to [facility] for the cardiac catheterization in any case if she needs renal consultation. Cardiac catheterization was done on the 11th and showed mild to moderate LAD stenosis with no significant RCA or left circumflex stenosis. Patient was cleared by cardiology for discharge. No she'll be discharged on followup with cardiology as outpatient.  2. Chronic kidney disease stage III: This is been stable with a creatinine of 1.8 for the time of discharge at the time of admission creatinine was 1.9. Patient underwent cardiac catheterization it was expected for the creatinine to go up a little bit but he did not. Followup with primary care  physician for further evaluation and optimal control of blood pressure.  3. Pneumonia: Patient was suspected to have pneumonia the time of admission and she was treated with antibiotics for 5 days. Patient chest x-ray showed atelectasis versus pneumonia and patient did have some fever and chills also she was treated as she's having pneumonia with bronchodilators, mucolytics and antibiotics. Patient feels much better. New  4.  Shortness of breath/cough: The patient said she had these symptoms for quite some time which exacerbated recently by the pneumonia. Outpatient followup with her primary care physician. She mentioned also that sometimes she wheezes she might need workup for COPD. Patient is on beta blocker and Coreg, probably she will need a cardiology advise as outpatient for continuation of this medication.  Day of Discharge BP 150/71  Pulse 74  Temp(Src) 98.1 F (36.7 C) (Oral)  Resp 18  Ht 5\' 5"  (1.651 m)  Wt 84.6 kg (186 lb 8.2 oz)  BMI 31.04 kg/m2  SpO2 96% Physical Exam: GEN: No acute distress, cooperative with exam PSYCH: He is alert and oriented x4; does not appear anxious does not appear depressed; affect is normal  HEENT: Mucous membranes pink and anicteric;  Mouth: without oral thrush or lesions Eyes: PERRLA; EOM intact;  Neck: no cervical lymphadenopathy nor thyromegaly or carotid bruit; no JVD;  CHEST WALL: No tenderness, symmetrical to breathing bilaterally CHEST: Normal respiration, clear to auscultation bilaterally  HEART: Regular rate and rhythm; no murmurs, rubs or gallops, S1 and S2 heard  BACK: No kyphosis or scoliosis; no CVA tenderness  ABDOMEN:  soft non-tender; no masses, no organomegaly, normal abdominal bowel sounds; no pannus; no intertriginous candida.  EXTREMITIES: No bone or joint deformity; no edema; no ulcerations.  PULSES: 2+ and symmetric, neurovascularity is intact SKIN: Normal hydration no rash or ulceration, no flushing or suspicious lesions  CNS: Cranial nerves 2-12 grossly intact no focal neurologic deficit, coordination is intact gait not tested  BMET    Component Value Date/Time   NA 141 04/22/2011 0625   K 4.1 04/22/2011 0625   CL 108 04/22/2011 0625   CO2 23 04/22/2011 0625   GLUCOSE 98 04/22/2011 0625   BUN 19 04/22/2011 0625   CREATININE 1.84* 04/22/2011 0625   CALCIUM 9.0 04/22/2011 0625   GFRNONAA 29* 04/22/2011 0625   GFRAA 34* 04/22/2011 0625       Disposition: Home.   Follow-up Appts: Discharge Orders    Future Appointments: Provider: Department: Dept Phone: Center:   05/08/2011 9:30 AM Liliane Shi, [place] [phone] [id number]     Future Orders Please Complete By Expires   Diet - low sodium heart healthy      Increase activity slowly         Follow-up Information    Follow up with Richardson Dopp, PA. (Office will call you)    Contact information:   [address] [place] [phone]       Follow up with [facility],ELM EUGENE.         I spent 40 minutes completing paperwork and coordinating discharge efforts.  SignedVerlee Monte A 04/22/2011, 9:17 AM

## 2011-04-22 NOTE — Progress Notes (Signed)
   CARE MANAGEMENT NOTE 04/22/2011  Patient:  Brittany Charles, Brittany Charles   Account Number:  [id number]  Date Initiated:  04/20/2011  Documentation initiated by:  Tomi Bamberger  Subjective/Objective Assessment:   dx chest pain,  admit- lives with family.     Action/Plan:   txfr from [facility] needs cath ( in renal failure)   Anticipated DC Date:  04/22/2011   Anticipated DC Plan:  [place]  CM consult  [facility] / [facility] (established/new)  [facility]      Choice offered to / List presented to:             Status of service:  Completed, signed off Medicare Important Message given?   (If response is "NO", the following Medicare IM given date fields will be blank) Date Medicare IM given:   Date Additional Medicare IM given:    Discharge Disposition:  HOME/SELF CARE  Per UR Regulation:    Comments:  04/22/11 15:54 Tomi Bamberger RN, BSn [phone] patient for dc today, set up with [facility] eligibility apt on 1/22 at 10:30 and hospital f/u appt on 2/8 at 9:00 with Dr. Redmond Pulling.  04/20/11 16: [place], BSN [phone] patient lives with family, transfer from [place], patient in ARF and needs cath.  NCM will continue to follow for dc needs.

## 2011-04-22 NOTE — Progress Notes (Signed)
PRELIMINARY  PRELIMINARY  PRELIMINARY  PRELIMINARY  Right groin ultrasound completed.    Preliminary report:  No evidence of pseudoaneurysm or av fisula.   Judithann Sauger, RVT 04/22/2011 10:51 AM

## 2011-04-22 NOTE — Progress Notes (Signed)
Patient Name: Brittany Charles Date of Encounter: 04/22/2011    SUBJECTIVE: No CP. Patient has some wheezing and coughing. She states this is chronic for her. She is not on anything or this at home. She requests her groin check to be done at [facility].   OBJECTIVE Filed Vitals:   04/21/11 1350 04/21/11 1652 04/21/11 2100 04/22/11 0500  BP: 138/71 132/63 145/70 150/71  Pulse: 67  71 74  Temp: 98.1 F (36.7 C)  98.1 F (36.7 C) 98.1 F (36.7 C)  TempSrc: Oral  Oral Oral  Resp: 18  18 18   Height:      Weight:    186 lb 8.2 oz (84.6 kg)  SpO2: 98%  97% 96%    Intake/Output Summary (Last 24 hours) at 04/22/11 0739 Last data filed at 04/21/11 1700  Gross per 24 hour  Intake      0 ml  Output      0 ml  Net      0 ml   Weight change: 3 lb 4.9 oz (1.5 kg)  PHYSICAL EXAM  General: Well developed, well nourished, in no acute distress. Head: Normocephalic, atraumatic, sclera non-icteric, nares are without discharge, dentition is poor  Neck: Supple without bruits, JVD at 6cm. Lungs:  Resp she has a slight expiratory wheeze and coughs. The cough is nonproductive and no rhonchi are noted. Heart: RRR no s3, s4, 3/6 murmur. Abdomen: Soft, non-tender, non-distended, BS + x 4.  Msk:  Strength and tone appears normal for age. Extremities: No clubbing, cyanosis or edema. DP/PT/Radials 2+ and equal bilaterally. Cath site is without hematoma. Femoral bruits are present bilaterally. Neuro: Alert and oriented X 3. Moves all extremities spontaneously. Psych: Normal affect.  LABS:  CBC: Basename 04/20/11 0500  WBC 8.7  NEUTROABS --  HGB 9.9*  HCT 33.1*  MCV 82.8  PLT 99991111*   Basic Metabolic Panel:  Basename 04/21/11 0530 04/20/11 0500  NA 137 137  K 3.9 3.9  CL 104 104  CO2 23 24  GLUCOSE 111* 154*  BUN 23 23  CREATININE 2.08* 2.21*  CALCIUM 8.5 9.3  MG -- --  PHOS 4.3 --   Liver Function Tests:  Basename 04/21/11 0530  AST --  ALT --  ALKPHOS --  BILITOT --    PROT --  ALBUMIN 2.3*    TELE sinus rhythm    Current Medications:    . amLODipine  5 mg Oral BID  . aspirin  324 mg Oral Pre-Cath  . aspirin  325 mg Oral Daily  . azithromycin  500 mg Oral Daily  . benzonatate  100 mg Oral TID  . carvedilol  25 mg Oral BID WC  . cefTRIAXone (ROCEPHIN)  IV  1 g Intravenous Q24H  . diazepam  5 mg Oral On Call  . enoxaparin (LOVENOX) injection  30 mg Subcutaneous Q24H  . heparin      . hydrALAZINE  50 mg Oral Q8H  . lidocaine      . sodium chloride  3 mL Intravenous Q12H  . DISCONTD: sodium chloride  3 mL Intravenous Q12H    ASSESSMENT AND PLAN: 1. Acute on chronic diastolic CHF: The patient had an EF of 45% by echocardiogram but her EF was normal at cath (55%) and no critical coronary artery disease. Currently she is euvolemic. 2. Possible CAD: Cardiac catheterization yesterday showed a 50% LAD. There was no critical disease and medical therapy is recommended with aggressive cardiac risk factor reduction. We  will make her a followup appointment at our office for a groin check. Aspirin can be decreased to 81 mg daily at the primary team's discretion. 3. Wheezing: The patient states she has this chronically at home. She is not on anything for this. Currently her oxygen saturation is good and we will leave management of this to the primary team. 4. systolic ejection murmur and bilateral femoral bruits: There were no significant valvular abnormalities on echocardiogram. Distal pulses are intact and there is no evidence of a pseudoaneurysm or fistula at her cath site. 5. Hyperglycemia: We will leave evaluation and management of this to the primary team 6. Anemia: We will leave the evaluation and management of this to the primary team  Plan: No further inpatient cardiology workup is indicated at this time. We will follow her as an outpatient. She is to see Richardson Dopp, PA-C for Dr. Stanford Breed on Dec 28th @ 9:30.  Signed, Rosaria Ferries , PA-C As  above; patient seen and examined; chest - CTA and CV - RRR; right groin with bruit; nonobstructive CAD by cath; continue ASA; add statin at discharge - pravachol 40 mg daily; continue meds for BP. Check BMET12/14 to make sure renal function does not deteriorate post cath. Continue present BP meds at DC. Check dopplers to exclude pseudoaneurysm prior to dc. Kirk Ruths

## 2011-04-22 NOTE — Progress Notes (Signed)
Pt. Discharged to home via private vehicle accompanied by friend.  Discharge instructions given, including medications, future appointment, and information on bronchitis.  Pt. States has good understanding of instructions.  Escorted to exit via wheelchair by nurse tech.

## 2011-04-23 LAB — CULTURE, BLOOD (ROUTINE X 2): Culture: NO GROWTH

## 2011-04-24 ENCOUNTER — Other Ambulatory Visit: Payer: Self-pay | Admitting: Cardiovascular Disease

## 2011-04-24 MED ORDER — CARVEDILOL 25 MG PO TABS: 25.0000 mg | ORAL_TABLET | Freq: Two times a day (BID) | ORAL | Status: DC

## 2011-04-24 MED ORDER — HYDRALAZINE HCL 50 MG PO TABS: 50.0000 mg | ORAL_TABLET | Freq: Two times a day (BID) | ORAL | Status: DC

## 2011-04-24 MED ORDER — AMLODIPINE BESYLATE 5 MG PO TABS: 5.0000 mg | ORAL_TABLET | Freq: Two times a day (BID) | ORAL | Status: DC

## 2011-04-24 MED ORDER — FUROSEMIDE 40 MG PO TABS: 40.0000 mg | ORAL_TABLET | Freq: Two times a day (BID) | ORAL | Status: DC

## 2011-04-24 NOTE — Telephone Encounter (Signed)
New Msg: Pt calling needing RX's called into pharmacy following hospital d/c. Pt would like generic rx's called in if possible.

## 2011-05-07 ENCOUNTER — Encounter: Payer: Self-pay | Admitting: Physician Assistant

## 2011-05-08 ENCOUNTER — Encounter: Payer: 59 | Admitting: Physician Assistant

## 2011-11-04 ENCOUNTER — Encounter: Payer: Self-pay | Admitting: *Deleted

## 2011-11-04 ENCOUNTER — Telehealth: Payer: Self-pay | Admitting: Physician Assistant

## 2011-11-04 NOTE — Telephone Encounter (Signed)
New msg Pt is needing a letter for disability claim. Please call back to discuss

## 2011-11-04 NOTE — Telephone Encounter (Signed)
I called the # listed [phone] for the servant cntr and also the home #, work # as well emergency # to reach pt but no success, she was not known @ the servant cntr. I sent ltr denying letter for disability claim since she was supposed to f/u last yr after cath with Dr. Burt Knack, she had appt 05/08/11 to see Brynda Rim. PAC but cx. She will need appt 1st to be evaluated

## 2012-10-01 ENCOUNTER — Emergency Department (HOSPITAL_COMMUNITY): Payer: Self-pay

## 2012-10-01 ENCOUNTER — Inpatient Hospital Stay (HOSPITAL_COMMUNITY)
Admission: EM | Admit: 2012-10-01 | Discharge: 2012-10-04 | DRG: 444 | Disposition: A | Discharge status: Home / Self Care | Payer: MEDICAID | Attending: Internal Medicine | Admitting: Internal Medicine

## 2012-10-01 ENCOUNTER — Encounter (HOSPITAL_COMMUNITY): Payer: Self-pay | Admitting: Emergency Medicine

## 2012-10-01 ENCOUNTER — Inpatient Hospital Stay (HOSPITAL_COMMUNITY): Payer: Self-pay

## 2012-10-01 DIAGNOSIS — J189 Pneumonia, unspecified organism: Secondary | ICD-10-CM

## 2012-10-01 DIAGNOSIS — K8000 Calculus of gallbladder with acute cholecystitis without obstruction: Principal | ICD-10-CM | POA: Diagnosis present

## 2012-10-01 DIAGNOSIS — R112 Nausea with vomiting, unspecified: Secondary | ICD-10-CM | POA: Diagnosis present

## 2012-10-01 DIAGNOSIS — I129 Hypertensive chronic kidney disease with stage 1 through stage 4 chronic kidney disease, or unspecified chronic kidney disease: Secondary | ICD-10-CM | POA: Diagnosis present

## 2012-10-01 DIAGNOSIS — I5043 Acute on chronic combined systolic (congestive) and diastolic (congestive) heart failure: Secondary | ICD-10-CM | POA: Diagnosis present

## 2012-10-01 DIAGNOSIS — R0602 Shortness of breath: Secondary | ICD-10-CM | POA: Diagnosis present

## 2012-10-01 DIAGNOSIS — N185 Chronic kidney disease, stage 5: Secondary | ICD-10-CM | POA: Diagnosis present

## 2012-10-01 DIAGNOSIS — R1011 Right upper quadrant pain: Secondary | ICD-10-CM | POA: Diagnosis present

## 2012-10-01 DIAGNOSIS — R05 Cough: Secondary | ICD-10-CM

## 2012-10-01 DIAGNOSIS — R079 Chest pain, unspecified: Secondary | ICD-10-CM

## 2012-10-01 DIAGNOSIS — I509 Heart failure, unspecified: Secondary | ICD-10-CM | POA: Diagnosis present

## 2012-10-01 DIAGNOSIS — I5020 Unspecified systolic (congestive) heart failure: Secondary | ICD-10-CM | POA: Diagnosis present

## 2012-10-01 DIAGNOSIS — R109 Unspecified abdominal pain: Secondary | ICD-10-CM

## 2012-10-01 DIAGNOSIS — M329 Systemic lupus erythematosus, unspecified: Secondary | ICD-10-CM | POA: Diagnosis present

## 2012-10-01 DIAGNOSIS — N19 Unspecified kidney failure: Secondary | ICD-10-CM

## 2012-10-01 DIAGNOSIS — N183 Chronic kidney disease, stage 3 unspecified: Secondary | ICD-10-CM | POA: Diagnosis present

## 2012-10-01 DIAGNOSIS — D631 Anemia in chronic kidney disease: Secondary | ICD-10-CM | POA: Diagnosis present

## 2012-10-01 DIAGNOSIS — I10 Essential (primary) hypertension: Secondary | ICD-10-CM | POA: Diagnosis present

## 2012-10-01 DIAGNOSIS — K810 Acute cholecystitis: Secondary | ICD-10-CM

## 2012-10-01 LAB — URINALYSIS, ROUTINE W REFLEX MICROSCOPIC
Glucose, UA: NEGATIVE mg/dL
Specific Gravity, Urine: 1.027 (ref 1.005–1.030)
Urobilinogen, UA: 0.2 mg/dL (ref 0.0–1.0)
pH: 5 (ref 5.0–8.0)

## 2012-10-01 LAB — CBC WITH DIFFERENTIAL/PLATELET
Basophils Absolute: 0 10*3/uL (ref 0.0–0.1)
Basophils Relative: 0 % (ref 0–1)
Eosinophils Relative: 1 % (ref 0–5)
HCT: 36.2 % (ref 36.0–46.0)
MCHC: 32.9 g/dL (ref 30.0–36.0)
MCV: 84.8 fL (ref 78.0–100.0)
Monocytes Absolute: 0.6 10*3/uL (ref 0.1–1.0)
Platelets: 352 10*3/uL (ref 150–400)
RDW: 15.8 % — ABNORMAL HIGH (ref 11.5–15.5)

## 2012-10-01 LAB — COMPREHENSIVE METABOLIC PANEL
AST: 20 U/L (ref 0–37)
Albumin: 3 g/dL — ABNORMAL LOW (ref 3.5–5.2)
Calcium: 9.2 mg/dL (ref 8.4–10.5)
Creatinine, Ser: 2.35 mg/dL — ABNORMAL HIGH (ref 0.50–1.10)
Sodium: 142 mEq/L (ref 135–145)
Total Protein: 7.5 g/dL (ref 6.0–8.3)

## 2012-10-01 LAB — TROPONIN I: Troponin I: <0.3 ng/mL (ref <0.30)

## 2012-10-01 LAB — URINE MICROSCOPIC-ADD ON

## 2012-10-01 LAB — LIPASE, BLOOD: Lipase: 14 U/L (ref 11–59)

## 2012-10-01 LAB — CG4 I-STAT (LACTIC ACID): Lactic Acid, Venous: 1.23 mmol/L (ref 0.5–2.2)

## 2012-10-01 LAB — PRO B NATRIURETIC PEPTIDE: Pro B Natriuretic peptide (BNP): 14608 pg/mL — ABNORMAL HIGH (ref 0–125)

## 2012-10-01 MED ORDER — HYDRALAZINE HCL 50 MG PO TABS
50.0000 mg | ORAL_TABLET | Freq: Two times a day (BID) | ORAL | Status: DC
  Administered 2012-10-02 – 2012-10-04 (×6): 50 mg via ORAL
  Filled 2012-10-01 (×7): qty 1

## 2012-10-01 MED ORDER — SODIUM CHLORIDE 0.9 % IJ SOLN
3.0000 mL | Freq: Two times a day (BID) | INTRAMUSCULAR | Status: DC
  Administered 2012-10-02 – 2012-10-04 (×5): 3 mL via INTRAVENOUS

## 2012-10-01 MED ORDER — ONDANSETRON HCL 4 MG PO TABS: 4.0000 mg | ORAL_TABLET | Freq: Four times a day (QID) | ORAL | Status: DC | PRN

## 2012-10-01 MED ORDER — MORPHINE SULFATE 2 MG/ML IJ SOLN
1.0000 mg | INTRAMUSCULAR | Status: DC | PRN
  Administered 2012-10-02 – 2012-10-03 (×2): 1 mg via INTRAVENOUS
  Filled 2012-10-01 (×2): qty 1

## 2012-10-01 MED ORDER — HYDROCODONE-ACETAMINOPHEN 5-325 MG PO TABS
1.0000 | ORAL_TABLET | ORAL | Status: DC | PRN
  Filled 2012-10-01: qty 2

## 2012-10-01 MED ORDER — SODIUM CHLORIDE 0.9 % IV SOLN: Freq: Once | INTRAVENOUS | Status: DC

## 2012-10-01 MED ORDER — ENOXAPARIN SODIUM 30 MG/0.3ML ~~LOC~~ SOLN
30.0000 mg | SUBCUTANEOUS | Status: DC
  Administered 2012-10-02 – 2012-10-03 (×3): 30 mg via SUBCUTANEOUS
  Filled 2012-10-01 (×4): qty 0.3

## 2012-10-01 MED ORDER — ONDANSETRON HCL 4 MG/2ML IJ SOLN: 4.0000 mg | Freq: Three times a day (TID) | INTRAMUSCULAR | Status: DC | PRN

## 2012-10-01 MED ORDER — POTASSIUM CHLORIDE CRYS ER 20 MEQ PO TBCR
20.0000 meq | EXTENDED_RELEASE_TABLET | Freq: Two times a day (BID) | ORAL | Status: DC
  Administered 2012-10-02 – 2012-10-04 (×6): 20 meq via ORAL
  Filled 2012-10-01 (×6): qty 1

## 2012-10-01 MED ORDER — SODIUM CHLORIDE 0.9 % IV SOLN
INTRAVENOUS | Status: DC
  Administered 2012-10-01: 23:00:00 via INTRAVENOUS

## 2012-10-01 MED ORDER — ONDANSETRON HCL 4 MG/2ML IJ SOLN
4.0000 mg | Freq: Four times a day (QID) | INTRAMUSCULAR | Status: DC | PRN
  Administered 2012-10-02: 4 mg via INTRAVENOUS
  Filled 2012-10-01: qty 2

## 2012-10-01 MED ORDER — SODIUM CHLORIDE 0.9 % IV SOLN
3.0000 g | Freq: Two times a day (BID) | INTRAVENOUS | Status: DC
  Administered 2012-10-02 – 2012-10-04 (×5): 3 g via INTRAVENOUS
  Filled 2012-10-01 (×6): qty 3

## 2012-10-01 MED ORDER — HYDROMORPHONE HCL PF 1 MG/ML IJ SOLN: 1.0000 mg | INTRAMUSCULAR | Status: DC | PRN

## 2012-10-01 MED ORDER — IOHEXOL 300 MG/ML  SOLN
80.0000 mL | Freq: Once | INTRAMUSCULAR | Status: AC | PRN
  Administered 2012-10-01: 80 mL via INTRAVENOUS

## 2012-10-01 MED ORDER — SODIUM CHLORIDE 0.9 % IV SOLN: INTRAVENOUS | Status: DC

## 2012-10-01 MED ORDER — SODIUM CHLORIDE 0.9 % IV SOLN
3.0000 g | Freq: Once | INTRAVENOUS | Status: AC
  Administered 2012-10-01: 3 g via INTRAVENOUS
  Filled 2012-10-01: qty 3

## 2012-10-01 MED ORDER — ONDANSETRON HCL 4 MG/2ML IJ SOLN
4.0000 mg | Freq: Once | INTRAMUSCULAR | Status: AC
  Administered 2012-10-01: 4 mg via INTRAVENOUS
  Filled 2012-10-01: qty 2

## 2012-10-01 MED ORDER — SODIUM CHLORIDE 0.9 % IV SOLN
Freq: Once | INTRAVENOUS | Status: AC
  Administered 2012-10-01: 18:00:00 via INTRAVENOUS

## 2012-10-01 MED ORDER — MORPHINE SULFATE 4 MG/ML IJ SOLN
2.0000 mg | Freq: Once | INTRAMUSCULAR | Status: AC
  Administered 2012-10-01: 2 mg via INTRAVENOUS
  Filled 2012-10-01: qty 1

## 2012-10-01 MED ORDER — SODIUM CHLORIDE 0.9 % IV SOLN: 3.0000 g | Freq: Two times a day (BID) | INTRAVENOUS | Status: DC

## 2012-10-01 MED ORDER — IOHEXOL 300 MG/ML  SOLN
50.0000 mL | Freq: Once | INTRAMUSCULAR | Status: AC | PRN
  Administered 2012-10-01: 50 mL via ORAL

## 2012-10-01 MED ORDER — ACETAMINOPHEN 325 MG PO TABS
650.0000 mg | ORAL_TABLET | Freq: Four times a day (QID) | ORAL | Status: DC | PRN
  Administered 2012-10-02 – 2012-10-04 (×2): 650 mg via ORAL
  Filled 2012-10-01 (×2): qty 2

## 2012-10-01 MED ORDER — LISINOPRIL 20 MG PO TABS
20.0000 mg | ORAL_TABLET | Freq: Every day | ORAL | Status: DC
  Administered 2012-10-02 – 2012-10-04 (×4): 20 mg via ORAL
  Filled 2012-10-01 (×4): qty 1

## 2012-10-01 MED ORDER — AMLODIPINE BESYLATE 5 MG PO TABS
5.0000 mg | ORAL_TABLET | Freq: Two times a day (BID) | ORAL | Status: DC
  Administered 2012-10-02 – 2012-10-03 (×4): 5 mg via ORAL
  Filled 2012-10-01 (×5): qty 1

## 2012-10-01 NOTE — Consult Note (Signed)
Reason for Consult:abdominal pain question  cholecystitis Referring Physician: Noemi Chapel MD  Brittany Charles is an 62 y.o. female.  HPI: Asked to see pt at request of Dr Sabra Heck for abdominal pain.  Pt has had 5 days of SOB and RUQ abdominal pain.  Not related to eating.  Nausea and vomiting started on wed.  Sxs no better so she came to ED.  CT showed some fluid vs thickening of GB.  No U/S done.  Pt still c/o of RUQ pain.  Constant and sharp.  Radiated into chest.  Hx CHF.  CRI,  LUPUS.    Past Medical History  Diagnosis Date  . CHF (congestive heart failure)   . Lupus (systemic lupus erythematosus)   . Hypertension   . Fibroids   . Renal insufficiency     Past Surgical History  Procedure Laterality Date  . Tonsillectomy    . Abdominal hysterectomy  10/27/2000    TAH, BSO    Family History  Problem Relation Age of Onset  . Hypertension Mother   . Hyperlipidemia Father   . Colon cancer    . Breast cancer    . Heart disease Mother     CHF    Social History:  reports that she has never smoked. She has never used smokeless tobacco. She reports that she does not drink alcohol or use illicit drugs.  Allergies:  Allergies  Allergen Reactions  . Zithromax (Azithromycin) Nausea And Vomiting    Medications: I have reviewed the patient's current medications.  Results for orders placed during the hospital encounter of 10/01/12 (from the past 48 hour(s))  CBC WITH DIFFERENTIAL     Status: Abnormal   Collection Time    10/01/12  5:50 PM      Result Value Range   WBC 10.1  4.0 - 10.5 K/uL   RBC 4.27  3.87 - 5.11 MIL/uL   Hemoglobin 11.9 (*) 12.0 - 15.0 g/dL   HCT 36.2  36.0 - 46.0 %   MCV 84.8  78.0 - 100.0 fL   MCH 27.9  26.0 - 34.0 pg   MCHC 32.9  30.0 - 36.0 g/dL   RDW 15.8 (*) 11.5 - 15.5 %   Platelets 352  150 - 400 K/uL   Neutrophils Relative % 77  43 - 77 %   Neutro Abs 7.8 (*) 1.7 - 7.7 K/uL   Lymphocytes Relative 15  12 - 46 %   Lymphs Abs 1.5  0.7 - 4.0 K/uL    Monocytes Relative 6  3 - 12 %   Monocytes Absolute 0.6  0.1 - 1.0 K/uL   Eosinophils Relative 1  0 - 5 %   Eosinophils Absolute 0.1  0.0 - 0.7 K/uL   Basophils Relative 0  0 - 1 %   Basophils Absolute 0.0  0.0 - 0.1 K/uL  COMPREHENSIVE METABOLIC PANEL     Status: Abnormal   Collection Time    10/01/12  5:50 PM      Result Value Range   Sodium 142  135 - 145 mEq/L   Potassium 3.5  3.5 - 5.1 mEq/L   Chloride 109  96 - 112 mEq/L   CO2 19  19 - 32 mEq/L   Glucose, Bld 114 (*) 70 - 99 mg/dL   BUN 19  6 - 23 mg/dL   Creatinine, Ser 2.35 (*) 0.50 - 1.10 mg/dL   Calcium 9.2  8.4 - 10.5 mg/dL   Total Protein 7.5  6.0 - 8.3 g/dL   Albumin 3.0 (*) 3.5 - 5.2 g/dL   AST 20  0 - 37 U/L   ALT 18  0 - 35 U/L   Alkaline Phosphatase 109  39 - 117 U/L   Total Bilirubin 0.5  0.3 - 1.2 mg/dL   GFR calc non Af Amer 21 (*) >90 mL/min   GFR calc Af Amer 25 (*) >90 mL/min   Comment:            The eGFR has been calculated     using the CKD EPI equation.     This calculation has not been     validated in all clinical     situations.     eGFR's persistently     <90 mL/min signify     possible Chronic Kidney Disease.  LIPASE, BLOOD     Status: None   Collection Time    10/01/12  5:50 PM      Result Value Range   Lipase 14  11 - 59 U/L  TROPONIN I     Status: None   Collection Time    10/01/12  5:50 PM      Result Value Range   Troponin I <0.30  <0.30 ng/mL   Comment:            Due to the release kinetics of cTnI,     a negative result within the first hours     of the onset of symptoms does not rule out     myocardial infarction with certainty.     If myocardial infarction is still suspected,     repeat the test at appropriate intervals.  PRO B NATRIURETIC PEPTIDE     Status: Abnormal   Collection Time    10/01/12  5:50 PM      Result Value Range   Pro B Natriuretic peptide (BNP) 14608.0 (*) 0 - 125 pg/mL  CG4 I-STAT (LACTIC ACID)     Status: None   Collection Time    10/01/12   5:58 PM      Result Value Range   Lactic Acid, Venous 1.23  0.5 - 2.2 mmol/L  URINALYSIS, ROUTINE W REFLEX MICROSCOPIC     Status: Abnormal   Collection Time    10/01/12  6:45 PM      Result Value Range   Color, Urine YELLOW  YELLOW   APPearance CLOUDY (*) CLEAR   Specific Gravity, Urine 1.027  1.005 - 1.030   pH 5.0  5.0 - 8.0   Glucose, UA NEGATIVE  NEGATIVE mg/dL   Hgb urine dipstick TRACE (*) NEGATIVE   Bilirubin Urine NEGATIVE  NEGATIVE   Ketones, ur NEGATIVE  NEGATIVE mg/dL   Protein, ur >300 (*) NEGATIVE mg/dL   Urobilinogen, UA 0.2  0.0 - 1.0 mg/dL   Nitrite NEGATIVE  NEGATIVE   Leukocytes, UA NEGATIVE  NEGATIVE  URINE MICROSCOPIC-ADD ON     Status: Abnormal   Collection Time    10/01/12  6:45 PM      Result Value Range   Squamous Epithelial / LPF FEW (*) RARE   WBC, UA 0-2  <3 WBC/hpf   RBC / HPF 0-2  <3 RBC/hpf   Bacteria, UA RARE  RARE    Ct Abdomen Pelvis W Contrast  10/01/2012   *RADIOLOGY REPORT*  Clinical Data: Abdominal pain and distention, shortness of breath  CT ABDOMEN AND PELVIS WITH CONTRAST  Technique:  Multidetector CT imaging of the abdomen  and pelvis was performed following the standard protocol during bolus administration of intravenous contrast.  Contrast: 62mL OMNIPAQUE IOHEXOL 300 MG/ML  SOLN, 53mL OMNIPAQUE IOHEXOL 300 MG/ML  SOLN  Comparison: No similar prior study is available for comparison.  Findings: Trace right greater than left pleural effusions are present.  Patchy areas of subpleural curvilinear airspace opacity could represent atelectasis or scarring.  Marked cardiomegaly noted with four-chamber prominence.  Mild atheromatous aortic calcification noted without aneurysm. There is gallbladder distention and pericholecystic fluid or wall thickening, for example image 29.  Trace right upper quadrant ascites is present.  3 mm too small to characterize right lower renal pole cortical hypodensity is identified image 38.  A similar 4 mm left mid renal  cortical hypodense lesion image 29. Statistically these most likely represent cysts.  Adrenal gland, spleen, and pancreas are normal. No lymphadenopathy or free air.  Small amount of low density free pelvic fluid is identified.  Ovary not visualized, likely surgically absent.  Status post hysterectomy.  The appendix is normal.  No bowel wall thickening or focal segmental dilatation.  No acute osseous abnormality.  IMPRESSION: Gallbladder distention with surrounding fluid or gallbladder wall thickening and trace abdominal ascites.  This could represent acute cholecystitis.  Cardiomegaly with trace right greater than left pleural effusions.  Too small to characterize hypodense sub centimeter renal cortical lesions, statistically most likely cysts.   Original Report Authenticated By: Conchita Paris, M.D.    Review of Systems  Constitutional: Positive for malaise/fatigue.  HENT: Negative.   Eyes: Negative.   Respiratory: Positive for shortness of breath.   Cardiovascular: Positive for chest pain and orthopnea.  Gastrointestinal: Positive for nausea, vomiting and abdominal pain.  Genitourinary: Negative.   Musculoskeletal: Positive for myalgias and joint pain.  Skin: Negative.   Neurological: Negative.   Endo/Heme/Allergies: Negative.   Psychiatric/Behavioral: Negative.    Blood pressure 184/101, pulse 101, temperature 100.1 F (37.8 C), temperature source Oral, resp. rate 26, SpO2 94.00%. Physical Exam  Constitutional: She is oriented to person, place, and time. She appears well-developed and well-nourished. No distress.  HENT:  Head: Normocephalic and atraumatic.  Eyes: EOM are normal. Pupils are equal, round, and reactive to light. No scleral icterus.  Neck: Normal range of motion. Neck supple.  Cardiovascular: Normal rate.   Respiratory: Effort normal. No respiratory distress.  GI: Soft. She exhibits no mass. There is tenderness in the right upper quadrant. There is no rebound and no  guarding.    Musculoskeletal: Normal range of motion.  Neurological: She is alert and oriented to person, place, and time.  Skin: Skin is warm and dry.  Psychiatric: She has a normal mood and affect. Her behavior is normal. Judgment and thought content normal.    Assessment/Plan: Abdominal pain   Questionable cholecystitis Needs U/S exam On abx Keep NPO Will reassess in am once U/S done.  History not consistent with cholecystitis and finding on workup.  If u/s normal or no stones will need HIDA BNP very high   Needs medical tune up  D/w pt and shevoices understanding. Patient Active Problem List   Diagnosis Date Noted  . Shortness of breath 04/22/2011  . CKD (chronic kidney disease), stage III 04/22/2011  . PNA (pneumonia) 04/20/2011  . Cough 04/17/2011  . Chest pain 04/17/2011  . Hypertension 04/17/2011  . Gout 04/17/2011  . Lupus (systemic lupus erythematosus) 04/17/2011    Brittany Charles A. 10/01/2012, 9:54 PM

## 2012-10-01 NOTE — ED Notes (Signed)
Pt c/o abd pain, n/v denies diarrhea x4 days.  Pt also reports SOB x1 day.  Pt is alert and oriented.  Current 02 95%ra

## 2012-10-01 NOTE — ED Notes (Signed)
MD at bedside. 

## 2012-10-01 NOTE — H&P (Signed)
[facility] History and Physical  Brittany Charles [id number] DOB: May 05, 1951 DOA: 10/01/2012  Referring physician: ED physician PCP: No primary provider on file.   Chief Complaint: Shortness of breath and right upper quadrant abdominal pain  HPI:  Pt is 62 yo female with CHF, lupus, HTN, CKD, who presents to [facility] ED with main concern of progressively worsening right upper quadrant abdominal pain that initially started 3-4 days prior to admission, intermittent and sharp in nature, radiating to lower abdominal area, associated with nausea, non bloody vomiting, fevers, chill, poor oral intake, abdominal distension, pt denies any specific alleviating or aggravating factor, no similar events in the past. Pt also endorses worsening shortness of breath and 2-3 pillow orthopnea, lower extremity swelling over the same period of time. Pt denies chest pain, other systemic symptoms, no focal neurological symptoms.   In ED, pt with persistent nausea and vomiting, CT abdomen and pelvis with ? Acute cholecystitis. [facility] asked to admit for further evaluation, surgery consulted by ED physician.  Assessment and Plan:  Principal Problem:   RUQ pain with nausea and vomiting  - questionable acute cholecystitis - surgery following, will admit to medical floor, supportive care with analgesia and antiemetics as needed - keep NPO for now, continue ABX Active Problems:   Hypertension, uncontrolled - ? Component of medical non compliance - will continue home medical regimen for now and will readjust the regimen as indicated   Shortness of breath - possible CHF, last 2 D ECHO 2 years ago with EF 45% - will continue Lasix and will avoid IVF as pt has crackles on exam - repeat 2 D ECHO, strict I's and O's, daily weights   CKD (chronic kidney disease), stage III - appears to be at pt's baseline - BMP in AM   Systolic CHF - will order 2 D ECHO as noted above, continue lasix  Code Status: Full Family  Communication: Pt and son at bedside Disposition Plan: Admit to medical floor   Review of Systems:  Constitutional: Positive for fever, chills and malaise/fatigue. Negative for diaphoresis.  HENT: Negative for hearing loss, ear pain, nosebleeds, congestion, sore throat, neck pain, tinnitus and ear discharge.   Eyes: Negative for blurred vision, double vision, photophobia, pain, discharge and redness.  Respiratory: Negative for cough, hemoptysis, sputum production, positive for shortness of breath Cardiovascular: Negative for chest pain, palpitations, positive for orthopnea, leg swelling.  Gastrointestinal: Positive for nausea, vomiting and abdominal pain. Negative for heartburn, constipation, blood in stool and melena.  Genitourinary: Negative for dysuria, urgency, frequency, hematuria and flank pain.  Musculoskeletal: Negative for myalgias, back pain, joint pain and falls.  Skin: Negative for itching and rash.  Neurological: Negative for dizziness and weakness. Negative for tingling, tremors, sensory change, speech change, focal weakness, loss of consciousness and headaches.  Endo/Heme/Allergies: Negative for environmental allergies and polydipsia. Does not bruise/bleed easily.  Psychiatric/Behavioral: Negative for suicidal ideas. The patient is not nervous/anxious.      Past Medical History  Diagnosis Date  . CHF (congestive heart failure)   . Lupus (systemic lupus erythematosus)   . Hypertension   . Fibroids   . Renal insufficiency     Past Surgical History  Procedure Laterality Date  . Tonsillectomy    . Abdominal hysterectomy  10/27/2000    TAH, BSO    Social History:  reports that she has never smoked. She has never used smokeless tobacco. She reports that she does not drink alcohol or use illicit drugs.  Allergies  Allergen Reactions  . Zithromax (Azithromycin) Nausea And Vomiting    Family History  Problem Relation Age of Onset  . Hypertension Mother   .  Hyperlipidemia Father   . Colon cancer    . Breast cancer    . Heart disease Mother     CHF    Prior to Admission medications   Medication Sig Start Date End Date Taking? Authorizing Provider  acetaminophen (TYLENOL) 325 MG tablet Take 650 mg by mouth every 6 (six) hours as needed for pain.   Yes Historical Provider, MD  amLODipine (NORVASC) 5 MG tablet Take 1 tablet (5 mg total) by mouth 2 (two) times daily. 04/24/11  Yes [name], MD  furosemide (LASIX) 40 MG tablet Take 1 tablet (40 mg total) by mouth 2 (two) times daily. 04/24/11  Yes [name], MD  hydrALAZINE (APRESOLINE) 50 MG tablet Take 1 tablet (50 mg total) by mouth 2 (two) times daily. 04/24/11  Yes [name], MD  lisinopril (PRINIVIL,ZESTRIL) 20 MG tablet Take 20 mg by mouth daily.   Yes Historical Provider, MD  potassium chloride SA (K-DUR,KLOR-CON) 20 MEQ tablet Take 20 mEq by mouth 2 (two) times daily.   Yes Historical Provider, MD    Physical Exam: Filed Vitals:   10/01/12 1708  BP: 184/101  Pulse: 101  Temp: 100.1 F (37.8 C)  TempSrc: Oral  Resp: 26  SpO2: 94%    Physical Exam  Constitutional: Appears well-developed and well-nourished. In mild distress due to nausea and vomiting.  HENT: Normocephalic. External right and left ear normal. Dry MM  Eyes: Conjunctivae and EOM are normal. PERRLA, no scleral icterus.  Neck: Normal ROM. Neck supple. No JVD. No tracheal deviation. No thyromegaly.  CVS: Regular rhythm, tachycardic, S1/S2 +, no murmurs, no gallops, no carotid bruit.  Pulmonary: Effort and breath sounds normal, no stridor, decreased breath sounds at bases, expiratory wheezing Abdominal: Soft. BS +,  no distension, tenderness in right upper quadrant area, no rebound or guarding.  Musculoskeletal: Normal range of motion. No edema and no tenderness.  Lymphadenopathy: No lymphadenopathy noted, cervical, inguinal. Neuro: Alert. Normal reflexes, muscle tone coordination. No cranial nerve  deficit. Skin: Skin is warm and dry. No rash noted. Not diaphoretic. No erythema. No pallor.  Psychiatric: Normal mood and affect. Behavior, judgment, thought content normal.   Labs on Admission:  Basic Metabolic Panel:  Recent Labs Lab 10/01/12 1750  NA 142  K 3.5  CL 109  CO2 19  GLUCOSE 114*  BUN 19  CREATININE 2.35*  CALCIUM 9.2   Liver Function Tests:  Recent Labs Lab 10/01/12 1750  AST 20  ALT 18  ALKPHOS 109  BILITOT 0.5  PROT 7.5  ALBUMIN 3.0*    Recent Labs Lab 10/01/12 1750  LIPASE 14   CBC:  Recent Labs Lab 10/01/12 1750  WBC 10.1  NEUTROABS 7.8*  HGB 11.9*  HCT 36.2  MCV 84.8  PLT 352   Cardiac Enzymes:  Recent Labs Lab 10/01/12 1750  TROPONINI <0.30   Radiological Exams on Admission: Ct Abdomen Pelvis W Contrast  10/01/2012   *RADIOLOGY REPORT*  Clinical Data: Abdominal pain and distention, shortness of breath  CT ABDOMEN AND PELVIS WITH CONTRAST  Technique:  Multidetector CT imaging of the abdomen and pelvis was performed following the standard protocol during bolus administration of intravenous contrast.  Contrast: 72mL OMNIPAQUE IOHEXOL 300 MG/ML  SOLN, 63mL OMNIPAQUE IOHEXOL 300 MG/ML  SOLN  Comparison: No similar prior study is available for comparison.  Findings: Trace right greater than left pleural effusions are present.  Patchy areas of subpleural curvilinear airspace opacity could represent atelectasis or scarring.  Marked cardiomegaly noted with four-chamber prominence.  Mild atheromatous aortic calcification noted without aneurysm. There is gallbladder distention and pericholecystic fluid or wall thickening, for example image 29.  Trace right upper quadrant ascites is present.  3 mm too small to characterize right lower renal pole cortical hypodensity is identified image 38.  A similar 4 mm left mid renal cortical hypodense lesion image 29. Statistically these most likely represent cysts.  Adrenal gland, spleen, and pancreas are  normal. No lymphadenopathy or free air.  Small amount of low density free pelvic fluid is identified.  Ovary not visualized, likely surgically absent.  Status post hysterectomy.  The appendix is normal.  No bowel wall thickening or focal segmental dilatation.  No acute osseous abnormality.  IMPRESSION: Gallbladder distention with surrounding fluid or gallbladder wall thickening and trace abdominal ascites.  This could represent acute cholecystitis.  Cardiomegaly with trace right greater than left pleural effusions.  Too small to characterize hypodense sub centimeter renal cortical lesions, statistically most likely cysts.   Original Report Authenticated By: Conchita Paris, M.D.    EKG: Normal sinus rhythm, no ST/T wave changes  Faye Ramsay, MD  [facility] Pager [phone]  If 7PM-7AM, please contact night-coverage [email or web address] Password [facility] 10/01/2012, 9:27 PM

## 2012-10-01 NOTE — ED Provider Notes (Signed)
History     CSN: [id number]  Arrival date & time 10/01/12  1658   First MD Initiated Contact with Patient 10/01/12 1714      Chief Complaint  Patient presents with  . Shortness of Breath  . Nausea  . Emesis  . Abdominal Pain    (Consider location/radiation/quality/duration/timing/severity/associated sxs/prior treatment) HPI Comments: 62 year old female with a history of CHF, lupus, hypertension and renal insufficiency who presents with 4 days of gradual onset, persistent, gradually worsening abdominal pain and distention. She has associated subjective fevers, nausea and vomiting throughout the day today, no stools and only minimal amount of gas area.  She has a history of a hysterectomy many years ago but no other abdominal surgery, no history of cancer.    Patient is a 62 y.o. female presenting with shortness of breath, vomiting, and abdominal pain. The history is provided by the patient and a relative.  Shortness of Breath Associated symptoms: abdominal pain and vomiting   Emesis Associated symptoms: abdominal pain   Abdominal Pain Associated symptoms include abdominal pain and shortness of breath.    Past Medical History  Diagnosis Date  . CHF (congestive heart failure)   . Lupus (systemic lupus erythematosus)   . Hypertension   . Fibroids   . Renal insufficiency     Past Surgical History  Procedure Laterality Date  . Tonsillectomy    . Abdominal hysterectomy  10/27/2000    TAH, BSO    Family History  Problem Relation Age of Onset  . Hypertension Mother   . Hyperlipidemia Father   . Colon cancer    . Breast cancer    . Heart disease Mother     CHF    History  Substance Use Topics  . Smoking status: Never Smoker   . Smokeless tobacco: Never Used  . Alcohol Use: No    OB History   Grav Para Term Preterm Abortions TAB SAB Ect Mult Living                  Review of Systems  Respiratory: Positive for shortness of breath.   Gastrointestinal: Positive  for vomiting and abdominal pain.  All other systems reviewed and are negative.    Allergies  Zithromax  Home Medications   Current Outpatient Rx  Name  Route  Sig  Dispense  Refill  . acetaminophen (TYLENOL) 325 MG tablet   Oral   Take 650 mg by mouth every 6 (six) hours as needed for pain.         Marland Kitchen amLODipine (NORVASC) 5 MG tablet   Oral   Take 1 tablet (5 mg total) by mouth 2 (two) times daily.   60 tablet   1   . furosemide (LASIX) 40 MG tablet   Oral   Take 1 tablet (40 mg total) by mouth 2 (two) times daily.   60 tablet   1   . hydrALAZINE (APRESOLINE) 50 MG tablet   Oral   Take 1 tablet (50 mg total) by mouth 2 (two) times daily.   60 tablet   1   . lisinopril (PRINIVIL,ZESTRIL) 20 MG tablet   Oral   Take 20 mg by mouth daily.         . potassium chloride SA (K-DUR,KLOR-CON) 20 MEQ tablet   Oral   Take 20 mEq by mouth 2 (two) times daily.           BP 184/101  Pulse 101  Temp(Src) 100.1 F (37.8 C) (  Oral)  Resp 26  SpO2 94%  Physical Exam  Nursing note and vitals reviewed. Constitutional: She appears well-developed and well-nourished.  Uncomfortable appearing  HENT:  Head: Normocephalic and atraumatic.  Mouth/Throat: Oropharynx is clear and moist. No oropharyngeal exudate.  Eyes: Conjunctivae and EOM are normal. Pupils are equal, round, and reactive to light. Right eye exhibits no discharge. Left eye exhibits no discharge. No scleral icterus.  Neck: Normal range of motion. Neck supple. No JVD present. No thyromegaly present.  Cardiovascular: Normal rate, regular rhythm, normal heart sounds and intact distal pulses.  Exam reveals no gallop and no friction rub.   No murmur heard. Pulmonary/Chest: Effort normal and breath sounds normal. No respiratory distress. She has no wheezes. She has no rales.  Abdominal: Soft. Bowel sounds are normal. She exhibits distension. She exhibits no mass. There is tenderness ( Right upper quadrant, epigastric and  left upper quadrant tenderness with guarding, distention present, no peritoneal signs).  No tympanitic sounds to percussion  Musculoskeletal: Normal range of motion. She exhibits no edema and no tenderness.  Lymphadenopathy:    She has no cervical adenopathy.  Neurological: She is alert. Coordination normal.  Skin: Skin is warm and dry. No rash noted. No erythema.  Psychiatric: She has a normal mood and affect. Her behavior is normal.    ED Course  Procedures (including critical care time)  Labs Reviewed  CBC WITH DIFFERENTIAL - Abnormal; Notable for the following:    Hemoglobin 11.9 (*)    RDW 15.8 (*)    Neutro Abs 7.8 (*)    All other components within normal limits  COMPREHENSIVE METABOLIC PANEL - Abnormal; Notable for the following:    Glucose, Bld 114 (*)    Creatinine, Ser 2.35 (*)    Albumin 3.0 (*)    GFR calc non Af Amer 21 (*)    GFR calc Af Amer 25 (*)    All other components within normal limits  URINALYSIS, ROUTINE W REFLEX MICROSCOPIC - Abnormal; Notable for the following:    APPearance CLOUDY (*)    Hgb urine dipstick TRACE (*)    Protein, ur >300 (*)    All other components within normal limits  PRO B NATRIURETIC PEPTIDE - Abnormal; Notable for the following:    Pro B Natriuretic peptide (BNP) 14608.0 (*)    All other components within normal limits  URINE MICROSCOPIC-ADD ON - Abnormal; Notable for the following:    Squamous Epithelial / LPF FEW (*)    All other components within normal limits  LIPASE, BLOOD  TROPONIN I  CG4 I-STAT (LACTIC ACID)   Ct Abdomen Pelvis W Contrast  10/01/2012   *RADIOLOGY REPORT*  Clinical Data: Abdominal pain and distention, shortness of breath  CT ABDOMEN AND PELVIS WITH CONTRAST  Technique:  Multidetector CT imaging of the abdomen and pelvis was performed following the standard protocol during bolus administration of intravenous contrast.  Contrast: 73mL OMNIPAQUE IOHEXOL 300 MG/ML  SOLN, 77mL OMNIPAQUE IOHEXOL 300 MG/ML  SOLN   Comparison: No similar prior study is available for comparison.  Findings: Trace right greater than left pleural effusions are present.  Patchy areas of subpleural curvilinear airspace opacity could represent atelectasis or scarring.  Marked cardiomegaly noted with four-chamber prominence.  Mild atheromatous aortic calcification noted without aneurysm. There is gallbladder distention and pericholecystic fluid or wall thickening, for example image 29.  Trace right upper quadrant ascites is present.  3 mm too small to characterize right lower renal pole cortical hypodensity  is identified image 38.  A similar 4 mm left mid renal cortical hypodense lesion image 29. Statistically these most likely represent cysts.  Adrenal gland, spleen, and pancreas are normal. No lymphadenopathy or free air.  Small amount of low density free pelvic fluid is identified.  Ovary not visualized, likely surgically absent.  Status post hysterectomy.  The appendix is normal.  No bowel wall thickening or focal segmental dilatation.  No acute osseous abnormality.  IMPRESSION: Gallbladder distention with surrounding fluid or gallbladder wall thickening and trace abdominal ascites.  This could represent acute cholecystitis.  Cardiomegaly with trace right greater than left pleural effusions.  Too small to characterize hypodense sub centimeter renal cortical lesions, statistically most likely cysts.   Original Report Authenticated By: Conchita Paris, M.D.     1. Acute cholecystitis   2. Renal failure   3. CHF (congestive heart failure)       MDM  The patient appears uncomfortable, she has a borderline fever of 100.1, she has mild hypertension, mild hypoxia. With her history of congestive heart failure and renal insufficiency and lupus will need a CT scan of the abdomen to rule out obstruction, CHF evaluation with a BNP and a chest x-ray and symptomatic medications for nausea and pain. She will stay on a heart rate monitor as  well.  No leukocytosis but has elevated BNP, elevated Cr, and UA showing proteinuria and dehydration with high SG.  She has normal LFT's but CT shows cholecystitis  Discussed with Dr. Brantley Stage of general surgery, Dr. Charlies Silvers with hospitalist who will admit. Antibiotics, and by mouth, patient reexamined and has ongoing moderate upper abdominal tenderness but this is no worse than it was initially, she declines further pain or nausea medications at this time.      Johnna Acosta, MD 10/01/12 2118

## 2012-10-01 NOTE — ED Notes (Signed)
Pt unable to provide urine for UA at this time, will continue to monitor.

## 2012-10-01 NOTE — ED Notes (Signed)
General surgeon at bedside.  

## 2012-10-02 DIAGNOSIS — I5021 Acute systolic (congestive) heart failure: Secondary | ICD-10-CM

## 2012-10-02 DIAGNOSIS — K8020 Calculus of gallbladder without cholecystitis without obstruction: Secondary | ICD-10-CM

## 2012-10-02 DIAGNOSIS — I5020 Unspecified systolic (congestive) heart failure: Secondary | ICD-10-CM | POA: Diagnosis present

## 2012-10-02 DIAGNOSIS — R1011 Right upper quadrant pain: Secondary | ICD-10-CM | POA: Diagnosis present

## 2012-10-02 LAB — CBC
HCT: 34.3 % — ABNORMAL LOW (ref 36.0–46.0)
Hemoglobin: 10.8 g/dL — ABNORMAL LOW (ref 12.0–15.0)
MCH: 26.9 pg (ref 26.0–34.0)
MCV: 85.3 fL (ref 78.0–100.0)
RBC: 4.02 MIL/uL (ref 3.87–5.11)

## 2012-10-02 LAB — COMPREHENSIVE METABOLIC PANEL
ALT: 15 U/L (ref 0–35)
AST: 16 U/L (ref 0–37)
Albumin: 2.6 g/dL — ABNORMAL LOW (ref 3.5–5.2)
Calcium: 8.6 mg/dL (ref 8.4–10.5)
Creatinine, Ser: 2.18 mg/dL — ABNORMAL HIGH (ref 0.50–1.10)
GFR calc non Af Amer: 23 mL/min — ABNORMAL LOW (ref >90)
Sodium: 140 mEq/L (ref 135–145)
Total Protein: 6.4 g/dL (ref 6.0–8.3)

## 2012-10-02 LAB — TROPONIN I: Troponin I: <0.3 ng/mL (ref <0.30)

## 2012-10-02 MED ORDER — CLONIDINE HCL 0.1 MG PO TABS
0.1000 mg | ORAL_TABLET | Freq: Four times a day (QID) | ORAL | Status: DC | PRN
  Administered 2012-10-02 – 2012-10-03 (×2): 0.1 mg via ORAL
  Filled 2012-10-02 (×4): qty 1

## 2012-10-02 MED ORDER — FUROSEMIDE 10 MG/ML IJ SOLN
20.0000 mg | Freq: Two times a day (BID) | INTRAMUSCULAR | Status: DC
  Administered 2012-10-02 – 2012-10-03 (×4): 20 mg via INTRAVENOUS
  Filled 2012-10-02 (×6): qty 2

## 2012-10-02 NOTE — Progress Notes (Signed)
  Echocardiogram 2D Echocardiogram has been performed.  Brittany Charles 10/02/2012, 10:45 AM

## 2012-10-02 NOTE — Progress Notes (Signed)
Patient ID: Brittany Charles, female   DOB: 1950-06-15, 62 y.o.   MRN: [id number]  General Surgery - [facility] - Progress Note  HD# 2  Subjective: Patient without complaint this AM.  Abdominal pain has resolved.  No nausea or emesis.  Wants to eat.  Objective: Vital signs in last 24 hours: Temp:  [98 F (36.7 C)-100.1 F (37.8 C)] 98 F (36.7 C) (05/25 0548) Pulse Rate:  [81-101] 81 (05/25 0548) Resp:  [21-26] 22 (05/25 0548) BP: (171-191)/(83-103) 171/83 mmHg (05/25 0548) SpO2:  [94 %-100 %] 94 % (05/25 0548) Weight:  [208 lb 8.9 oz (94.6 kg)-218 lb 0.6 oz (98.9 kg)] 208 lb 8.9 oz (94.6 kg) (05/25 0644) Last BM Date: 09/28/12  Intake/Output from previous day: 05/24 0701 - 05/25 0700 In: -  Out: 1450 [Urine:1450]  Exam: HEENT - clear, not icteric Neck - soft Chest - clear bilaterally Cor - RRR, no murmur Abd - soft, obese; no tenderness, no guarding, no mass; RUQ without tenderness Ext - no significant edema Neuro - grossly intact, no focal deficits  Lab Results:   Recent Labs  10/01/12 1750 10/02/12 0558  WBC 10.1 10.2  HGB 11.9* 10.8*  HCT 36.2 34.3*  PLT 352 317     Recent Labs  10/01/12 1750 10/02/12 0558  NA 142 140  K 3.5 3.4*  CL 109 108  CO2 19 20  GLUCOSE 114* 94  BUN 19 19  CREATININE 2.35* 2.18*  CALCIUM 9.2 8.6    Studies/Results: US Abdomen Complete  10/02/2012   *RADIOLOGY REPORT*  Clinical Data:  Right upper quadrant abdominal pain, nausea and vomiting.  ABDOMINAL ULTRASOUND COMPLETE  Comparison:  Renal ultrasound performed 04/21/2011, and CT of the abdomen and pelvis performed earlier today at 07:20 p.m.  Findings:  Gallbladder:  Diffuse gallbladder wall thickening is again noted, measuring up to 1.0 cm in thickness.  This reflects diffuse wall edema.  A likely 4 mm stone is noted dependently within the gallbladder.  No ultrasonographic Murphy's sign is elicited, though the patient has been given pain medication.   Common Bile Duct:  0.3 cm in diameter; within normal limits in caliber.  Liver:  Normal parenchymal echogenicity and echotexture; no focal lesions identified.  Limited Doppler evaluation demonstrates normal blood flow within the liver.  IVC:  Unremarkable in appearance.  Pancreas:  Although the pancreas is difficult to visualize in its entirety due to overlying bowel gas, no focal pancreatic abnormality is identified.  Spleen:  9.4 cm in length; within normal limits in size and echotexture.  Right kidney:  9.9 cm in length; normal in size and configuration. Mildly increased parenchymal echogenicity raises question for medical renal disease.  No evidence of mass or hydronephrosis.  Left kidney:  10.2 cm in length; normal in size and configuration. Mildly increased parenchymal echogenicity raises question for medical renal disease.  No evidence of hydronephrosis. A small 0.9 cm cyst is noted at the interpole region of the left kidney.  Abdominal Aorta:  Normal in caliber; no aneurysm identified.  IMPRESSION:  1.  Diffuse gallbladder wall thickening again noted, measuring up to 1.0 cm, compatible with wall edema.  Likely 4 mm stone noted dependently in the gallbladder.  Given trace ascites noted on CT, this is nonspecific, but cholecystitis cannot be excluded.  No ultrasonographic Murphy's sign is noted, but this is equivocal given pain medication administration. 2.  Mildly increased renal parenchymal echogenicity raises question for medical renal disease.  3.  Small left renal cyst seen.   Original Report Authenticated By: Santa Lighter, M.D.   Ct Abdomen Pelvis W Contrast  10/01/2012   *RADIOLOGY REPORT*  Clinical Data: Abdominal pain and distention, shortness of breath  CT ABDOMEN AND PELVIS WITH CONTRAST  Technique:  Multidetector CT imaging of the abdomen and pelvis was performed following the standard protocol during bolus administration of intravenous contrast.  Contrast: 72mL OMNIPAQUE IOHEXOL 300 MG/ML   SOLN, 56mL OMNIPAQUE IOHEXOL 300 MG/ML  SOLN  Comparison: No similar prior study is available for comparison.  Findings: Trace right greater than left pleural effusions are present.  Patchy areas of subpleural curvilinear airspace opacity could represent atelectasis or scarring.  Marked cardiomegaly noted with four-chamber prominence.  Mild atheromatous aortic calcification noted without aneurysm. There is gallbladder distention and pericholecystic fluid or wall thickening, for example image 29.  Trace right upper quadrant ascites is present.  3 mm too small to characterize right lower renal pole cortical hypodensity is identified image 38.  A similar 4 mm left mid renal cortical hypodense lesion image 29. Statistically these most likely represent cysts.  Adrenal gland, spleen, and pancreas are normal. No lymphadenopathy or free air.  Small amount of low density free pelvic fluid is identified.  Ovary not visualized, likely surgically absent.  Status post hysterectomy.  The appendix is normal.  No bowel wall thickening or focal segmental dilatation.  No acute osseous abnormality.  IMPRESSION: Gallbladder distention with surrounding fluid or gallbladder wall thickening and trace abdominal ascites.  This could represent acute cholecystitis.  Cardiomegaly with trace right greater than left pleural effusions.  Too small to characterize hypodense sub centimeter renal cortical lesions, statistically most likely cysts.   Original Report Authenticated By: Conchita Paris, M.D.    Assessment / Plan: 1.  Abdominal pain  Resolved ? 2.  Cholelithiasis / rule out cholecystitis  Patient with marked improvement in symptoms  Not toxic, afebrile, WBC normal  Will allow clear liquid diet  No plans for urgent operative intervention at present  Will follow with you  Earnstine Regal, MD, [facility] Office: [phone]  10/02/2012

## 2012-10-02 NOTE — Progress Notes (Signed)
[facility] PROGRESS NOTE  Brittany Charles [id number] DOB: 1951/05/01 DOA: 10/01/2012 PCP: No primary provider on file.  Assessment/Plan: Principal Problem:   RUQ pain Active Problems:   Hypertension   Shortness of breath   CKD (chronic kidney disease), stage III   Systolic CHF    Pt is 62 yo female with CHF, lupus, HTN, CKD, who presents to [facility] ED with main concern of progressively worsening right upper quadrant abdominal pain that initially started 3-4 days prior to admission, intermittent and sharp in nature, radiating to lower abdominal area, associated with nausea, non bloody vomiting, fevers, chill, poor oral intake, abdominal distension, pt denies any specific alleviating or aggravating factor, no similar events in the past. Pt also endorses worsening shortness of breath and 2-3 pillow orthopnea, lower extremity swelling over the same period of time. Pt denies chest pain, other systemic symptoms, no focal neurological symptoms.  In ED, pt with persistent nausea and vomiting, CT abdomen and pelvis with ? Acute cholecystitis. [facility] asked to admit for further evaluation, surgery consulted by ED physician.    Assessment and Plan:  Principal Problem:  RUQ pain with nausea and vomiting  - questionable acute cholecystitis , confirmed by ultrasound, continue Unasyn - surgery following, will admit to medical floor, supportive care with analgesia and antiemetics as needed  - keep NPO for now,   Active Problems:  Hypertension, uncontrolled  - ? Component of medical non compliance  -Continue when necessary hydralazine, Will need to be initiated on long-term anti- hypertensive medications when able to take by mouth  Shortness of breath  - possible CHF, last 2 D ECHO 2 years ago with EF 45%  - will continue Lasix and will avoid IVF as pt has crackles on exam  - repeat 2 D ECHO, strict I's and O's, daily weights   CKD (chronic kidney disease), stage III  - appears to be at  pt's baseline  - BMP in AM   Systolic CHF  - will order 2 D ECHO as noted above, continue lasix      HPI/Subjective: Hypertensive overnight secondary to pain  Objective: Filed Vitals:   10/02/12 0200 10/02/12 0315 10/02/12 0548 10/02/12 0644  BP: 185/103 191/100 171/83   Pulse: 87  81   Temp: 98.4 F (36.9 C)  98 F (36.7 C)   TempSrc: Oral  Oral   Resp: 21  22   Height:      Weight:    94.6 kg (208 lb 8.9 oz)  SpO2: 94%  94%     Intake/Output Summary (Last 24 hours) at 10/02/12 0847 Last data filed at 10/02/12 0700  Gross per 24 hour  Intake      0 ml  Output   1450 ml  Net  -1450 ml    Exam: Constitutional: Appears well-developed and well-nourished. In mild distress due to nausea and vomiting.  HENT: Normocephalic. External right and left ear normal. Dry MM  Eyes: Conjunctivae and EOM are normal. PERRLA, no scleral icterus.  Neck: Normal ROM. Neck supple. No JVD. No tracheal deviation. No thyromegaly.  CVS: Regular rhythm, tachycardic, S1/S2 +, no murmurs, no gallops, no carotid bruit.  Pulmonary: Effort and breath sounds normal, no stridor, decreased breath sounds at bases, expiratory wheezing  Abdominal: Soft. BS +, no distension, tenderness in right upper quadrant area, no rebound or guarding.  Musculoskeletal: Normal range of motion. No edema and no tenderness.  Lymphadenopathy: No lymphadenopathy noted, cervical, inguinal. Neuro: Alert. Normal reflexes, muscle tone  coordination. No cranial nerve deficit.  Skin: Skin is warm and dry. No rash noted. Not diaphoretic. No erythema. No pallor.  Psychiatric: Normal mood and affect. Behavior, judgment, thought content normal   Data Reviewed: Basic Metabolic Panel:  Recent Labs Lab 10/01/12 1750 10/02/12 0558  NA 142 140  K 3.5 3.4*  CL 109 108  CO2 19 20  GLUCOSE 114* 94  BUN 19 19  CREATININE 2.35* 2.18*  CALCIUM 9.2 8.6    Liver Function Tests:  Recent Labs Lab 10/01/12 1750 10/02/12 0558  AST  20 16  ALT 18 15  ALKPHOS 109 98  BILITOT 0.5 0.5  PROT 7.5 6.4  ALBUMIN 3.0* 2.6*    Recent Labs Lab 10/01/12 1750  LIPASE 14   No results found for this basename: AMMONIA,  in the last 168 hours  CBC:  Recent Labs Lab 10/01/12 1750 10/02/12 0558  WBC 10.1 10.2  NEUTROABS 7.8*  --   HGB 11.9* 10.8*  HCT 36.2 34.3*  MCV 84.8 85.3  PLT 352 317    Cardiac Enzymes:  Recent Labs Lab 10/01/12 1750  TROPONINI <0.30   BNP (last 3 results)  Recent Labs  10/01/12 1750  PROBNP 14608.0*     CBG: No results found for this basename: GLUCAP,  in the last 168 hours  No results found for this or any previous visit (from the past 240 hour(s)).   Studies: US Abdomen Complete  10/02/2012   *RADIOLOGY REPORT*  Clinical Data:  Right upper quadrant abdominal pain, nausea and vomiting.  ABDOMINAL ULTRASOUND COMPLETE  Comparison:  Renal ultrasound performed 04/21/2011, and CT of the abdomen and pelvis performed earlier today at 07:20 p.m.  Findings:  Gallbladder:  Diffuse gallbladder wall thickening is again noted, measuring up to 1.0 cm in thickness.  This reflects diffuse wall edema.  A likely 4 mm stone is noted dependently within the gallbladder.  No ultrasonographic Murphy's sign is elicited, though the patient has been given pain medication.  Common Bile Duct:  0.3 cm in diameter; within normal limits in caliber.  Liver:  Normal parenchymal echogenicity and echotexture; no focal lesions identified.  Limited Doppler evaluation demonstrates normal blood flow within the liver.  IVC:  Unremarkable in appearance.  Pancreas:  Although the pancreas is difficult to visualize in its entirety due to overlying bowel gas, no focal pancreatic abnormality is identified.  Spleen:  9.4 cm in length; within normal limits in size and echotexture.  Right kidney:  9.9 cm in length; normal in size and configuration. Mildly increased parenchymal echogenicity raises question for medical renal disease.  No  evidence of mass or hydronephrosis.  Left kidney:  10.2 cm in length; normal in size and configuration. Mildly increased parenchymal echogenicity raises question for medical renal disease.  No evidence of hydronephrosis. A small 0.9 cm cyst is noted at the interpole region of the left kidney.  Abdominal Aorta:  Normal in caliber; no aneurysm identified.  IMPRESSION:  1.  Diffuse gallbladder wall thickening again noted, measuring up to 1.0 cm, compatible with wall edema.  Likely 4 mm stone noted dependently in the gallbladder.  Given trace ascites noted on CT, this is nonspecific, but cholecystitis cannot be excluded.  No ultrasonographic Murphy's sign is noted, but this is equivocal given pain medication administration. 2.  Mildly increased renal parenchymal echogenicity raises question for medical renal disease.  3.  Small left renal cyst seen.   Original Report Authenticated By: Santa Lighter, M.D.   Ct  Abdomen Pelvis W Contrast  10/01/2012   *RADIOLOGY REPORT*  Clinical Data: Abdominal pain and distention, shortness of breath  CT ABDOMEN AND PELVIS WITH CONTRAST  Technique:  Multidetector CT imaging of the abdomen and pelvis was performed following the standard protocol during bolus administration of intravenous contrast.  Contrast: 80mL OMNIPAQUE IOHEXOL 300 MG/ML  SOLN, 29mL OMNIPAQUE IOHEXOL 300 MG/ML  SOLN  Comparison: No similar prior study is available for comparison.  Findings: Trace right greater than left pleural effusions are present.  Patchy areas of subpleural curvilinear airspace opacity could represent atelectasis or scarring.  Marked cardiomegaly noted with four-chamber prominence.  Mild atheromatous aortic calcification noted without aneurysm. There is gallbladder distention and pericholecystic fluid or wall thickening, for example image 29.  Trace right upper quadrant ascites is present.  3 mm too small to characterize right lower renal pole cortical hypodensity is identified image 38.  A  similar 4 mm left mid renal cortical hypodense lesion image 29. Statistically these most likely represent cysts.  Adrenal gland, spleen, and pancreas are normal. No lymphadenopathy or free air.  Small amount of low density free pelvic fluid is identified.  Ovary not visualized, likely surgically absent.  Status post hysterectomy.  The appendix is normal.  No bowel wall thickening or focal segmental dilatation.  No acute osseous abnormality.  IMPRESSION: Gallbladder distention with surrounding fluid or gallbladder wall thickening and trace abdominal ascites.  This could represent acute cholecystitis.  Cardiomegaly with trace right greater than left pleural effusions.  Too small to characterize hypodense sub centimeter renal cortical lesions, statistically most likely cysts.   Original Report Authenticated By: Conchita Paris, M.D.    Scheduled Meds: . amLODipine  5 mg Oral BID  . ampicillin-sulbactam (UNASYN) IV  3 g Intravenous Q12H  . enoxaparin (LOVENOX) injection  30 mg Subcutaneous Q24H  . furosemide  20 mg Intravenous BID  . hydrALAZINE  50 mg Oral BID  . lisinopril  20 mg Oral Daily  . potassium chloride SA  20 mEq Oral BID  . sodium chloride  3 mL Intravenous Q12H   Continuous Infusions:   Principal Problem:   RUQ pain Active Problems:   Hypertension   Shortness of breath   CKD (chronic kidney disease), stage III   Systolic CHF    Time spent: 40 minutes   [facility] Pager [phone]. If 8PM-8AM, please contact night-coverage at [email or web address], password [facility] 10/02/2012, 8:47 AM  LOS: 1 day

## 2012-10-02 NOTE — Progress Notes (Signed)
Patient's blood pressure has been in the 180's since admission. Patient stated that she stopped taking her home medications on May 20th when her nausea and vomiting started.  MD was notified for the blood pressure and new order for clonadine was administered at 04:48. At 05:48 patient's blood pressure was 171/83.  First dose of Lasix was then given.  Will continue to monitor patient.   [name], RN

## 2012-10-02 NOTE — Progress Notes (Signed)
Patient's BP has been elevated since admission. BP at this time is 185/103, patient is currently complaining of severe abdominal pain, no nausea, no vomiting. T. Antihypertensive medication was just given about 15 minutes ago. Brittany Charles was made aware. Instructed to recheck BP shortly. Will continue to monitor patient.

## 2012-10-03 DIAGNOSIS — I10 Essential (primary) hypertension: Secondary | ICD-10-CM

## 2012-10-03 LAB — COMPREHENSIVE METABOLIC PANEL
AST: 18 U/L (ref 0–37)
CO2: 22 mEq/L (ref 19–32)
Chloride: 106 mEq/L (ref 96–112)
Creatinine, Ser: 2.23 mg/dL — ABNORMAL HIGH (ref 0.50–1.10)
GFR calc Af Amer: 26 mL/min — ABNORMAL LOW (ref >90)
GFR calc non Af Amer: 23 mL/min — ABNORMAL LOW (ref >90)
Glucose, Bld: 97 mg/dL (ref 70–99)
Total Bilirubin: 0.5 mg/dL (ref 0.3–1.2)

## 2012-10-03 LAB — CBC
MCH: 26.8 pg (ref 26.0–34.0)
MCHC: 31.3 g/dL (ref 30.0–36.0)
Platelets: 304 10*3/uL (ref 150–400)
RBC: 4.29 MIL/uL (ref 3.87–5.11)

## 2012-10-03 MED ORDER — FUROSEMIDE 10 MG/ML IJ SOLN
INTRAMUSCULAR | Status: AC
  Filled 2012-10-03: qty 4

## 2012-10-03 MED ORDER — AMLODIPINE BESYLATE 10 MG PO TABS
10.0000 mg | ORAL_TABLET | Freq: Two times a day (BID) | ORAL | Status: DC
  Administered 2012-10-03 – 2012-10-04 (×2): 10 mg via ORAL
  Filled 2012-10-03 (×3): qty 1

## 2012-10-03 NOTE — Progress Notes (Signed)
[facility] PROGRESS NOTE  Brittany Charles [id number] DOB: 12-Dec-1950 DOA: 10/01/2012 PCP: No primary provider on file.  Brief narrative: 63 year old female with past medical history of CHF, lupus, HTN, CKD, who presents to [facility] ED with main concern of progressively worsening right upper quadrant abdominal pain that initially started 3-4 days prior to this admission, intermittent and sharp in nature, radiating to lower abdominal area, associated with nausea, non bloody vomiting, fevers, chill, poor oral intake, abdominal distension with no specific alleviating or aggravating factors. Pt also reported worsening shortness of breath and 2-3 pillow orthopnea, lower extremity swelling over the same period of time.  In ED, vitals were stable. Further evaluation included  CT abdomen and pelvis which was significant for gallbladder distention with surrounding fluid or gallbladder wall thickening and trace abdominal ascites suspicious for acute cholecystitis. Abdominal US confirmed diffuse gallbladder wall thickening measuring up to 1.0 cm, compatible with wall edema.    Assessment and Plan:   Principal Problem:  RUQ pain with nausea and vomiting  - secondary to possible acute cholecystitis for which reason patietn was started on unasyn - patient afebrile and no elevated WBC - abdominal pain resolved - diet advanced to as toelrated - appreciate surgery following   Active Problems:  Hypertension, uncontrolled  - questionable compliance on patient's part - current BP 161/70; we will increase Norvasc to 10 mg daily Acute systolic and diastolic CHD exacerbation - 2 D ECHO on this admission with EF of 45% and BNP elevated at 14,608 (this can also be artificially elevated due to chronic kidney disease) - lasix 20 mg IV BID; strict intake and output; replace electrolytes as needed (continue potassium 20 meq PO BID) - continue Norvasc and lisinopril CKD (chronic kidney disease), stage III  -  appears to be at pt's baseline  - creatinine in range 2.18 - 2.35 Anemia of chronic disease - secondary to chronic kidney disease - hemoglobin stable at 11.5 - no indication for transfusion   Code Status: Full  Family Communication: family at the bedside Disposition Plan: home when stable  [facility] [id number]  Consultants:  Surgery  Procedures/Studies: US Abdomen Complete 10/02/2012   IMPRESSION:  1.  Diffuse gallbladder wall thickening again noted, measuring up to 1.0 cm, compatible with wall edema.  Likely 4 mm stone noted dependently in the gallbladder.  Given trace ascites noted on CT, this is nonspecific, but cholecystitis cannot be excluded.  No ultrasonographic Murphy's sign is noted, but this is equivocal given pain medication administration. 2.  Mildly increased renal parenchymal echogenicity raises question for medical renal disease.  3.  Small left renal cyst seen.     Ct Abdomen Pelvis W Contrast 10/01/2012    IMPRESSION: Gallbladder distention with surrounding fluid or gallbladder wall thickening and trace abdominal ascites.  This could represent acute cholecystitis.  Cardiomegaly with trace right greater than left pleural effusions.  Too small to characterize hypodense sub centimeter renal cortical lesions, statistically most likely cysts.   Antibiotics:  Unasyn 10/01/2012 -->   HPI/Subjective: No events overnight.   Objective: Filed Vitals:   10/02/12 2115 10/03/12 0530 10/03/12 0944 10/03/12 1300  BP: 164/77 157/72 179/83 161/70  Pulse: 79 76 83 81  Temp: 98.1 F (36.7 C) 98.1 F (36.7 C)  98 F (36.7 C)  TempSrc: Oral Oral  Oral  Resp: 20 20  18   Height:      Weight:      SpO2: 94% 93%  97%  Intake/Output Summary (Last 24 hours) at 10/03/12 1516 Last data filed at 10/03/12 1100  Gross per 24 hour  Intake    560 ml  Output   1950 ml  Net  -1390 ml    Exam:   General:  Pt is alert, follows commands appropriately, not in acute  distress  Cardiovascular: Regular rate and rhythm, S1/S2, no murmurs, no rubs, no gallops  Respiratory: Clear to auscultation bilaterally, no wheezing, no crackles, no rhonchi  Abdomen: Soft, non tender, non distended, bowel sounds present, no guarding  Extremities: No edema, pulses DP and PT palpable bilaterally  Neuro: Grossly nonfocal  Data Reviewed: Basic Metabolic Panel:  Recent Labs Lab 10/01/12 1750 10/02/12 0558 10/03/12 0430  NA 142 140 142  K 3.5 3.4* 3.6  CL 109 108 106  CO2 19 20 22   GLUCOSE 114* 94 97  BUN 19 19 15   CREATININE 2.35* 2.18* 2.23*  CALCIUM 9.2 8.6 8.9   Liver Function Tests:  Recent Labs Lab 10/01/12 1750 10/02/12 0558 10/03/12 0430  AST 20 16 18   ALT 18 15 13   ALKPHOS 109 98 96  BILITOT 0.5 0.5 0.5  PROT 7.5 6.4 7.0  ALBUMIN 3.0* 2.6* 2.7*    Recent Labs Lab 10/01/12 1750  LIPASE 14   No results found for this basename: AMMONIA,  in the last 168 hours CBC:  Recent Labs Lab 10/01/12 1750 10/02/12 0558 10/03/12 0430  WBC 10.1 10.2 9.8  NEUTROABS 7.8*  --   --   HGB 11.9* 10.8* 11.5*  HCT 36.2 34.3* 36.7  MCV 84.8 85.3 85.5  PLT 352 317 304   Cardiac Enzymes:  Recent Labs Lab 10/01/12 1750 10/02/12 0950  TROPONINI <0.30 <0.30   BNP: No components found with this basename: POCBNP,  CBG: No results found for this basename: GLUCAP,  in the last 168 hours  No results found for this or any previous visit (from the past 240 hour(s)).   Scheduled Meds: . amLODipine  5 mg Oral BID  . ampicillin-sulbactam (UNASYN) IV  3 g Intravenous Q12H  . enoxaparin (LOVENOX) injection  30 mg Subcutaneous Q24H  . furosemide      . furosemide  20 mg Intravenous BID  . hydrALAZINE  50 mg Oral BID  . lisinopril  20 mg Oral Daily  . potassium chloride SA  20 mEq Oral BID  . sodium chloride  3 mL Intravenous Q12H   Continuous Infusions:    Faye Ramsay, MD  [facility] Pager [phone]  If 7PM-7AM, please contact  night-coverage [email or web address] Password [facility] 10/03/2012, 3:16 PM   LOS: 2 days    .

## 2012-10-03 NOTE — Progress Notes (Signed)
RUQ pain  Assessment: Cholelitiasis, currently pain free  Plan: Await today's medical eval. She is asx today, but will need cholecystectomy at some point. If she is medically able it might be able to be done tomorrow, or, since her abd pain is resolved, she could go home and have surgery scheduled for a later dayt   Subjective: No abd pain or nausea, had a BM, feels better than at admission  Objective: Vital signs in last 24 hours: Temp:  [98.1 F (36.7 C)-98.6 F (37 C)] 98.1 F (36.7 C) (05/26 0530) Pulse Rate:  [76-88] 76 (05/26 0530) Resp:  [20] 20 (05/26 0530) BP: (155-182)/(72-93) 157/72 mmHg (05/26 0530) SpO2:  [93 %-98 %] 93 % (05/26 0530) Last BM Date: 10/02/12  Intake/Output from previous day: 05/25 0701 - 05/26 0700 In: 800 [P.O.:600; IV Piggyback:200] Out: 925 [Urine:925]  General appearance: alert, cooperative and no distress GI: soft, non-tender; bowel sounds normal; no masses,  no organomegaly  Lab Results:  Results for orders placed during the hospital encounter of 10/01/12 (from the past 24 hour(s))  TROPONIN I     Status: None   Collection Time    10/02/12  9:50 AM      Result Value Range   Troponin I <0.30  <0.30 ng/mL  COMPREHENSIVE METABOLIC PANEL     Status: Abnormal   Collection Time    10/03/12  4:30 AM      Result Value Range   Sodium 142  135 - 145 mEq/L   Potassium 3.6  3.5 - 5.1 mEq/L   Chloride 106  96 - 112 mEq/L   CO2 22  19 - 32 mEq/L   Glucose, Bld 97  70 - 99 mg/dL   BUN 15  6 - 23 mg/dL   Creatinine, Ser 2.23 (*) 0.50 - 1.10 mg/dL   Calcium 8.9  8.4 - 10.5 mg/dL   Total Protein 7.0  6.0 - 8.3 g/dL   Albumin 2.7 (*) 3.5 - 5.2 g/dL   AST 18  0 - 37 U/L   ALT 13  0 - 35 U/L   Alkaline Phosphatase 96  39 - 117 U/L   Total Bilirubin 0.5  0.3 - 1.2 mg/dL   GFR calc non Af Amer 23 (*) >90 mL/min   GFR calc Af Amer 26 (*) >90 mL/min  CBC     Status: Abnormal   Collection Time    10/03/12  4:30 AM      Result Value Range   WBC  9.8  4.0 - 10.5 K/uL   RBC 4.29  3.87 - 5.11 MIL/uL   Hemoglobin 11.5 (*) 12.0 - 15.0 g/dL   HCT 36.7  36.0 - 46.0 %   MCV 85.5  78.0 - 100.0 fL   MCH 26.8  26.0 - 34.0 pg   MCHC 31.3  30.0 - 36.0 g/dL   RDW 16.0 (*) 11.5 - 15.5 %   Platelets 304  150 - 400 K/uL     Studies/Results Radiology     MEDS, Scheduled . amLODipine  5 mg Oral BID  . ampicillin-sulbactam (UNASYN) IV  3 g Intravenous Q12H  . enoxaparin (LOVENOX) injection  30 mg Subcutaneous Q24H  . furosemide      . furosemide  20 mg Intravenous BID  . hydrALAZINE  50 mg Oral BID  . lisinopril  20 mg Oral Daily  . potassium chloride SA  20 mEq Oral BID  . sodium chloride  3 mL Intravenous Q12H  LOS: 2 days    Haywood Lasso, MD, [facility], [place]   10/03/2012 8:44 AM

## 2012-10-04 LAB — CBC
HCT: 40.5 % (ref 36.0–46.0)
Hemoglobin: 12.5 g/dL (ref 12.0–15.0)
MCV: 85.1 fL (ref 78.0–100.0)
RDW: 16.2 % — ABNORMAL HIGH (ref 11.5–15.5)
WBC: 9.6 10*3/uL (ref 4.0–10.5)

## 2012-10-04 LAB — BASIC METABOLIC PANEL
BUN: 16 mg/dL (ref 6–23)
CO2: 26 mEq/L (ref 19–32)
Chloride: 104 mEq/L (ref 96–112)
Creatinine, Ser: 2.34 mg/dL — ABNORMAL HIGH (ref 0.50–1.10)
GFR calc Af Amer: 25 mL/min — ABNORMAL LOW (ref >90)
Glucose, Bld: 93 mg/dL (ref 70–99)
Potassium: 3.7 mEq/L (ref 3.5–5.1)

## 2012-10-04 MED ORDER — CIPROFLOXACIN HCL 500 MG PO TABS: 500.0000 mg | ORAL_TABLET | Freq: Two times a day (BID) | ORAL | Status: DC

## 2012-10-04 MED ORDER — HYDROCODONE-ACETAMINOPHEN 5-325 MG PO TABS: 1.0000 | ORAL_TABLET | ORAL | Status: DC | PRN

## 2012-10-04 MED ORDER — ONDANSETRON HCL 4 MG PO TABS: 4.0000 mg | ORAL_TABLET | Freq: Four times a day (QID) | ORAL | Status: DC | PRN

## 2012-10-04 NOTE — Discharge Summary (Signed)
Physician Discharge Summary  Brittany Charles [id number] DOB: 11/22/1950 DOA: 10/01/2012  PCP: No primary provider on file.  Admit date: 10/01/2012 Discharge date: 10/04/2012  Recommendations for Outpatient Follow-up:  1. Pt will need to follow up with PCP in 2-3 weeks post discharge 2. Please obtain BMP to evaluate electrolytes and kidney function 3. Please also check CBC to evaluate Hg and Hct levels 4. Please note that pt was discharged on Ciprofloxacin to complete the therapy for 10 more days post discharge 5. Pt made aware of recommended follow up with surgical specialist to decide on cholecystectomy   Discharge Diagnoses: RUQ pain secondary to acute cholecystitis  Principal Problem:   RUQ pain Active Problems:   Hypertension   Shortness of breath   CKD (chronic kidney disease), stage III   Systolic CHF  Discharge Condition: Stable  Diet recommendation: Heart healthy diet discussed in details   Brief narrative:  62 year old female with past medical history of CHF, lupus, HTN, CKD, who presents to [facility] ED with main concern of progressively worsening right upper quadrant abdominal pain that initially started 3-4 days prior to this admission, intermittent and sharp in nature, radiating to lower abdominal area, associated with nausea, non bloody vomiting, fevers, chill, poor oral intake, abdominal distension with no specific alleviating or aggravating factors. Pt also reported worsening shortness of breath and 2-3 pillow orthopnea, lower extremity swelling over the same period of time.   In ED, vitals were stable. Further evaluation included CT abdomen and pelvis which was significant for gallbladder distention with surrounding fluid or gallbladder wall thickening and trace abdominal ascites suspicious for acute cholecystitis. Abdominal US confirmed diffuse gallbladder wall thickening measuring up to 1.0 cm, compatible with wall edema.   Assessment and Plan:  Principal Problem:   RUQ pain with nausea and vomiting  - secondary to possible acute cholecystitis for which reason patietn was started on unasyn  - patient afebrile and no elevated WBC  - abdominal pain resolved  - diet advanced to regular and pt tolerating well, will plan on transitioning to oral ABX Ciprofloxacin to complete the therapy for 10 more days post discharge  - appreciate surgery following  Active Problems:  Hypertension, uncontrolled  - questionable compliance on patient's part  - current BP 145/65 - compliance discussed in detail and pt verbalized understanding  Acute systolic and diastolic CHD exacerbation  - 2 D ECHO on this admission with EF of 45% and BNP elevated at 14,608 (this can also be artificially elevated due to chronic kidney disease)  - lasix 20 mg IV BID started on admission; strict intake and output monitored; replaced electrolytes as needed (continue potassium 20 meq PO BID)  - pt has responded well and we were able to transition to oral Lasix as per home medical regimen  - continue Norvasc and lisinopril upon discharge  CKD (chronic kidney disease), stage III  - appears to be at pt's baseline  - creatinine in range 2.18 - 2.35  Anemia of chronic disease  - secondary to chronic kidney disease  - hemoglobin stable at 11.5  - no indication for transfusion   Code Status: Full  Family Communication: family at the bedside   [place]  [id number]   Consultants:  Surgery Procedures/Studies:  US Abdomen Complete 10/02/2012 IMPRESSION: 1. Diffuse gallbladder wall thickening again noted, measuring up to 1.0 cm, compatible with wall edema. Likely 4 mm stone noted dependently in the gallbladder. Given trace ascites noted on CT, this  is nonspecific, but cholecystitis cannot be excluded. No ultrasonographic Murphy's sign is noted, but this is equivocal given pain medication administration. 2. Mildly increased renal parenchymal echogenicity raises question for medical renal  disease. 3. Small left renal cyst seen.  Ct Abdomen Pelvis W Contrast 10/01/2012 IMPRESSION: Gallbladder distention with surrounding fluid or gallbladder wall thickening and trace abdominal ascites. This could represent acute cholecystitis. Cardiomegaly with trace right greater than left pleural effusions. Too small to characterize hypodense sub centimeter renal cortical lesions, statistically most likely cysts.  Antibiotics:  Unasyn 10/01/2012 --> 05/27 Ciprofloxacin 05/27 --> 10 more days post discharge   Discharge Exam: Filed Vitals:   10/04/12 1010  BP: 146/65  Pulse: 83  Temp:   Resp:    Filed Vitals:   10/03/12 2246 10/04/12 0639 10/04/12 0642 10/04/12 1010  BP: 136/69 159/75  146/65  Pulse: 83 84  83  Temp: 98.3 F (36.8 C) 98.6 F (37 C)    TempSrc: Oral Oral    Resp: 24 24    Height:      Weight:   89.7 kg (197 lb 12 oz)   SpO2: 99% 96%      General: Pt is alert, follows commands appropriately, not in acute distress Cardiovascular: Regular rate and rhythm, S1/S2 +, no murmurs, no rubs, no gallops Respiratory: Clear to auscultation bilaterally, no wheezing, no crackles, no rhonchi Abdominal: Soft, non tender, non distended, bowel sounds +, no guarding Extremities: no edema, no cyanosis, pulses palpable bilaterally DP and PT Neuro: Grossly nonfocal  Discharge Instructions  Discharge Orders   Future Orders Complete By Expires     Diet - low sodium heart healthy  As directed     Increase activity slowly  As directed         Medication List    TAKE these medications       acetaminophen 325 MG tablet  Commonly known as:  TYLENOL  Take 650 mg by mouth every 6 (six) hours as needed for pain.     amLODipine 5 MG tablet  Commonly known as:  NORVASC  Take 1 tablet (5 mg total) by mouth 2 (two) times daily.     ciprofloxacin 500 MG tablet  Commonly known as:  CIPRO  Take 1 tablet (500 mg total) by mouth 2 (two) times daily.     furosemide 40 MG tablet   Commonly known as:  LASIX  Take 1 tablet (40 mg total) by mouth 2 (two) times daily.     hydrALAZINE 50 MG tablet  Commonly known as:  APRESOLINE  Take 1 tablet (50 mg total) by mouth 2 (two) times daily.     HYDROcodone-acetaminophen 5-325 MG per tablet  Commonly known as:  NORCO/VICODIN  Take 1-2 tablets by mouth every 4 (four) hours as needed.     lisinopril 20 MG tablet  Commonly known as:  PRINIVIL,ZESTRIL  Take 20 mg by mouth daily.     ondansetron 4 MG tablet  Commonly known as:  ZOFRAN  Take 1 tablet (4 mg total) by mouth every 6 (six) hours as needed for nausea.     potassium chloride SA 20 MEQ tablet  Commonly known as:  K-DUR,KLOR-CON  Take 20 mEq by mouth 2 (two) times daily.           Follow-up Information   Follow up with Earnstine Regal, MD. Schedule an appointment as soon as possible for a visit in 2 weeks. (If our office does not call and give you  a follow up appointment call when you get home and arrange for follow up.)    Contact information:   [place] [address] [phone]       Follow up with Faye Ramsay, MD In 4 weeks.   Contact information:   [facility] [phone]        The results of significant diagnostics from this hospitalization (including imaging, microbiology, ancillary and laboratory) are listed below for reference.     Microbiology: No results found for this or any previous visit (from the past 240 hour(s)).   Labs: Basic Metabolic Panel:  Recent Labs Lab 10/01/12 1750 10/02/12 0558 10/03/12 0430 10/04/12 0522  NA 142 140 142 141  K 3.5 3.4* 3.6 3.7  CL 109 108 106 104  CO2 19 20 22 26   GLUCOSE 114* 94 97 93  BUN 19 19 15 16   CREATININE 2.35* 2.18* 2.23* 2.34*  CALCIUM 9.2 8.6 8.9 9.1   Liver Function Tests:  Recent Labs Lab 10/01/12 1750 10/02/12 0558 10/03/12 0430  AST 20 16 18   ALT 18 15 13   ALKPHOS 109 98 96  BILITOT 0.5  0.5 0.5  PROT 7.5 6.4 7.0  ALBUMIN 3.0* 2.6* 2.7*    Recent Labs Lab 10/01/12 1750  LIPASE 14   No results found for this basename: AMMONIA,  in the last 168 hours CBC:  Recent Labs Lab 10/01/12 1750 10/02/12 0558 10/03/12 0430 10/04/12 0522  WBC 10.1 10.2 9.8 9.6  NEUTROABS 7.8*  --   --   --   HGB 11.9* 10.8* 11.5* 12.5  HCT 36.2 34.3* 36.7 40.5  MCV 84.8 85.3 85.5 85.1  PLT 352 317 304 365   Cardiac Enzymes:  Recent Labs Lab 10/01/12 1750 10/02/12 0950  TROPONINI <0.30 <0.30   BNP: BNP (last 3 results)  Recent Labs  10/01/12 1750  PROBNP 14608.0*   SIGNED: Time coordinating discharge: Over 30 minutes  Faye Ramsay, MD  [facility] 10/04/2012, 11:49 AM Pager [phone]  If 7PM-7AM, please contact night-coverage [email or web address] Password [email or web address]

## 2012-10-04 NOTE — Care Management Note (Addendum)
    Page 1 of 1   10/04/2012     4:16:10 PM   CARE MANAGEMENT NOTE 10/04/2012  Patient:  Brittany Charles, Brittany Charles   Account Number:  [id number]  Date Initiated:  10/04/2012  Documentation initiated by:  Dessa Phi  Subjective/Objective Assessment:   ADMITTED W/ABD PAIN.     Action/Plan:   FROM HOME   Anticipated DC Date:  10/04/2012   Anticipated DC Plan:  HOME/SELF CARE      DC Planning Services  CM consult      Choice offered to / List presented to:             Status of service:  Completed, signed off Medicare Important Message given?   (If response is "NO", the following Medicare IM given date fields will be blank) Date Medicare IM given:   Date Additional Medicare IM given:    Discharge Disposition:  HOME/SELF CARE  Per UR Regulation:  Reviewed for med. necessity/level of care/duration of stay  If discussed at [place] of Stay Meetings, dates discussed:    Comments:  10/04/12 Brittany Suddreth RN,BSN NCM [phone] NO D/C NEEDS OR ORDERS.

## 2012-10-04 NOTE — Progress Notes (Signed)
  Subjective: Just ate a regular breakfast, she had some discomfort last PM, but not enough to request pain medications.  She has no pain this AM.  Objective: Vital signs in last 24 hours: Temp:  [98 F (36.7 C)-98.6 F (37 C)] 98.6 F (37 C) (05/27 0639) Pulse Rate:  [81-89] 84 (05/27 0639) Resp:  [18-24] 24 (05/27 0639) BP: (136-179)/(60-83) 159/75 mmHg (05/27 0639) SpO2:  [96 %-99 %] 96 % (05/27 0639) Weight:  [89.7 kg (197 lb 12 oz)] 89.7 kg (197 lb 12 oz) (05/27 [id number]) Last BM Date: 10/02/12 480 PO recorded, Regular diet On Unasyn started 5/24 Afebrile, BP up some yesterday, Creatinine is stable, WBC remains normal, NO LFT elevation Intake/Output from previous day: 05/26 0701 - 05/27 0700 In: 580 [P.O.:480; IV Piggyback:100] Out: 2400 [Urine:2400] Intake/Output this shift:    General appearance: alert, cooperative and no distress Resp: clear to auscultation bilaterally GI: soft, non-tender; bowel sounds normal; no masses,  no organomegaly  Lab Results:   Recent Labs  10/03/12 0430 10/04/12 0522  WBC 9.8 9.6  HGB 11.5* 12.5  HCT 36.7 40.5  PLT 304 365    BMET  Recent Labs  10/03/12 0430 10/04/12 0522  NA 142 141  K 3.6 3.7  CL 106 104  CO2 22 26  GLUCOSE 97 93  BUN 15 16  CREATININE 2.23* 2.34*  CALCIUM 8.9 9.1   PT/INR No results found for this basename: LABPROT, INR,  in the last 72 hours   Recent Labs Lab 10/01/12 1750 10/02/12 0558 10/03/12 0430  AST 20 16 18   ALT 18 15 13   ALKPHOS 109 98 96  BILITOT 0.5 0.5 0.5  PROT 7.5 6.4 7.0  ALBUMIN 3.0* 2.6* 2.7*     Lipase     Component Value Date/Time   LIPASE 14 10/01/2012 1750     Studies/Results: No results found.  Medications: . amLODipine  10 mg Oral BID  . ampicillin-sulbactam (UNASYN) IV  3 g Intravenous Q12H  . enoxaparin (LOVENOX) injection  30 mg Subcutaneous Q24H  . furosemide  20 mg Intravenous BID  . hydrALAZINE  50 mg Oral BID  . lisinopril  20 mg Oral Daily  .  potassium chloride SA  20 mEq Oral BID  . sodium chloride  3 mL Intravenous Q12H    Assessment/Plan Abdominal pain Cholelithiasis / rule out cholecystitis [place]: diffuse GB thickening/edema with 4 mm stone Hypertension CM with hx of EF 45%/systolic CHF ( admit BNP [id number]) Bilateral pleural effusions;  R>L on admit. CKD/Stage III (creatinine 2.34) Hx of Lupus   Plan:  She appears to be doing well from the standpoint of her abdominal pain.  Tolerating a regular diet.  From our standpoint she can go home and when medically stable come back for elective cholecystectomy.  I have told her to stick to heart healthy diet, low NA, and to follow up with Dr.Gerkin for outpatient follow up/ cholecystectomy when medically optimal. We would recommend 5 more days of antibiotics.    LOS: 3 days    Zonnique Norkus 10/04/2012

## 2012-10-04 NOTE — Progress Notes (Signed)
General Surgery [facility]  Patient seen and agree with plans for discharge.  Will see in office and arrange for cholecystectomy on a semi-elective basis.  Earnstine Regal, MD, [facility] Office: [phone]

## 2012-10-05 ENCOUNTER — Telehealth (INDEPENDENT_AMBULATORY_CARE_PROVIDER_SITE_OTHER): Payer: Self-pay

## 2012-10-05 ENCOUNTER — Telehealth: Payer: Self-pay | Admitting: Internal Medicine

## 2012-10-05 NOTE — Telephone Encounter (Signed)
LMOM for pt to call for appt. When pt calls back she can be advised appt with Dr Harlow Asa is 6-16 arrive 4:00pm.

## 2012-10-05 NOTE — Telephone Encounter (Signed)
Pt says she was seen by Dr. Doyle Askew recently in the ED and was told she could have refills for her meds, Norvase, lasix, hydroalzile, and lisinopril. But when she left she forgot to ask for the scripts.   Thanks for your help.

## 2012-10-07 NOTE — Telephone Encounter (Signed)
I have called in prescriptions to pharmacy, can you please let pt know Thank you Ellard Artis, MD  [facility] Pager [phone]  If 7PM-7AM, please contact night-coverage [email or web address] Password [email or web address]

## 2012-10-10 ENCOUNTER — Ambulatory Visit: Payer: 59 | Attending: Family Medicine | Admitting: Internal Medicine

## 2012-10-10 ENCOUNTER — Encounter: Payer: Self-pay | Admitting: Internal Medicine

## 2012-10-10 VITALS — BP 176/98 | HR 91 | Temp 97.9°F | Resp 16 | Ht 63.0 in | Wt 197.8 lb

## 2012-10-10 DIAGNOSIS — N183 Chronic kidney disease, stage 3 unspecified: Secondary | ICD-10-CM

## 2012-10-10 DIAGNOSIS — M329 Systemic lupus erythematosus, unspecified: Secondary | ICD-10-CM

## 2012-10-10 DIAGNOSIS — I10 Essential (primary) hypertension: Secondary | ICD-10-CM

## 2012-10-10 DIAGNOSIS — I5032 Chronic diastolic (congestive) heart failure: Secondary | ICD-10-CM | POA: Insufficient documentation

## 2012-10-10 DIAGNOSIS — I129 Hypertensive chronic kidney disease with stage 1 through stage 4 chronic kidney disease, or unspecified chronic kidney disease: Secondary | ICD-10-CM | POA: Insufficient documentation

## 2012-10-10 DIAGNOSIS — R1011 Right upper quadrant pain: Secondary | ICD-10-CM

## 2012-10-10 MED ORDER — CLONIDINE HCL 0.1 MG PO TABS
0.1000 mg | ORAL_TABLET | Freq: Once | ORAL | Status: AC
  Administered 2012-10-10: 0.1 mg via ORAL

## 2012-10-10 MED ORDER — HYDRALAZINE HCL 50 MG PO TABS: 50.0000 mg | ORAL_TABLET | Freq: Three times a day (TID) | ORAL | Status: DC

## 2012-10-10 MED ORDER — AMLODIPINE BESYLATE 5 MG PO TABS: 10.0000 mg | ORAL_TABLET | Freq: Every day | ORAL | Status: DC

## 2012-10-10 MED ORDER — FUROSEMIDE 40 MG PO TABS: 40.0000 mg | ORAL_TABLET | Freq: Two times a day (BID) | ORAL | Status: DC

## 2012-10-10 NOTE — Progress Notes (Signed)
Patient recently discharged from [facility] ED History of HTN Has not had meds since discharged from hospital

## 2012-10-10 NOTE — Progress Notes (Signed)
Patient ID: Brittany Charles, female   DOB: [birth year], 62 y.o.   MRN: [id number] Patient Demographics  Brittany Charles, is a 62 y.o. female  [id number]  [id number]  DOB - 1951-03-19  Chief Complaint  Patient presents with  . Establish Care  . Medication Refill        Subjective:   [name] today is here to establish primary care. Patient has a  Past Medical History  Diagnosis Date  . CHF (congestive heart failure)   . Lupus (systemic lupus erythematosus)   . Hypertension   . Fibroids   . Renal insufficiency   . Patient was recently admitted to [facility] for acute cholecystitis and was treated with IV antibiotics, transition to oral ciprofloxacin and discharged home with surgical followup for elective cholecystectomy as an outpatient. She claims that she was not given her usual antihypertensive medications on discharge- and since hospital discharge last week she has not been taking any of her antihypertensive medications. Her right upper quadrant abdominal pain has completely resolved, she has no nausea or vomiting. She is tolerating regular diet. Currently patient has no complaints. Patient has also has No headache, No chest pain, No abdominal pain,No Nausea, No new weakness tingling or numbness, No Cough or SOB.   Objective:    Filed Vitals:   10/10/12 1234  BP: 184/130  Pulse: 91  Temp: 97.9 F (36.6 C)  Resp: 16  Height: 5\' 3"  (1.6 m)  Weight: 197 lb 12.8 oz (89.721 kg)  SpO2: 94%     ALLERGIES:   Allergies  Allergen Reactions  . Zithromax (Azithromycin) Nausea And Vomiting    PAST MEDICAL HISTORY: Past Medical History  Diagnosis Date  . CHF (congestive heart failure)   . Lupus (systemic lupus erythematosus)   . Hypertension   . Fibroids   . Renal insufficiency     PAST SURGICAL HISTORY: Past Surgical History  Procedure Laterality Date  . Tonsillectomy    . Abdominal hysterectomy  10/27/2000    TAH, BSO    FAMILY  HISTORY: Family History  Problem Relation Age of Onset  . Hypertension Mother   . Hyperlipidemia Father   . Colon cancer    . Breast cancer    . Heart disease Mother     CHF    MEDICATIONS AT HOME: Prior to Admission medications   Medication Sig Start Date End Date Taking? Authorizing Provider  acetaminophen (TYLENOL) 325 MG tablet Take 650 mg by mouth every 6 (six) hours as needed for pain.    Historical Provider, MD  amLODipine (NORVASC) 5 MG tablet Take 2 tablets (10 mg total) by mouth daily. 10/10/12   Ravon Mcilhenny Kristeen Mans, MD  ciprofloxacin (CIPRO) 500 MG tablet Take 1 tablet (500 mg total) by mouth 2 (two) times daily. 10/04/12   Theodis Blaze, MD  furosemide (LASIX) 40 MG tablet Take 1 tablet (40 mg total) by mouth 2 (two) times daily. 10/10/12   Chibuikem Thang Kristeen Mans, MD  hydrALAZINE (APRESOLINE) 50 MG tablet Take 1 tablet (50 mg total) by mouth 3 (three) times daily. 10/10/12   Omaree Fuqua Kristeen Mans, MD  HYDROcodone-acetaminophen (NORCO/VICODIN) 5-325 MG per tablet Take 1-2 tablets by mouth every 4 (four) hours as needed. 10/04/12   Theodis Blaze, MD  lisinopril (PRINIVIL,ZESTRIL) 20 MG tablet Take 20 mg by mouth daily.    Historical Provider, MD  ondansetron (ZOFRAN) 4 MG tablet Take 1 tablet (4 mg total) by mouth every 6 (six) hours as needed for nausea.  10/04/12   Theodis Blaze, MD  potassium chloride SA (K-DUR,KLOR-CON) 20 MEQ tablet Take 20 mEq by mouth 2 (two) times daily.    Historical Provider, MD    SOCIAL HISTORY:   reports that she has never smoked. She has never used smokeless tobacco. She reports that she does not drink alcohol or use illicit drugs.  REVIEW OF SYSTEMS:  Constitutional:   No   Fevers, chills, fatigue.  HEENT:    No headaches, Sore throat,   Cardio-vascular: No chest pain,  Orthopnea, swelling in lower extremities, anasarca, palpitations  GI:  No abdominal pain, nausea, vomiting, diarrhea  Resp: No shortness of breath,  No coughing up of blood.No cough.No  wheezing.  Skin:  no rash or lesions.  GU:  no dysuria, change in color of urine, no urgency or frequency.  No flank pain.  Musculoskeletal: No joint pain or swelling.  No decreased range of motion.  No back pain.  Psych: No change in mood or affect. No depression or anxiety.  No memory loss.   Exam  General appearance :Awake, alert, not in any distress. Speech Clear. Not toxic Looking HEENT: Atraumatic and Normocephalic, pupils equally reactive to light and accomodation Neck: supple, no JVD. No cervical lymphadenopathy.  Chest:Good air entry bilaterally, no added sounds  CVS: S1 S2 regular, no murmurs.  Abdomen: Bowel sounds present, Non tender and not distended with no gaurding, rigidity or rebound. Extremities: B/L Lower Ext shows no edema, both legs are warm to touch Neurology: Awake alert, and oriented X 3, CN II-XII intact, Non focal Skin:No Rash Wounds:N/A    Data Review   CBC  Recent Labs Lab 10/04/12 0522  WBC 9.6  HGB 12.5  HCT 40.5  PLT 365  MCV 85.1  MCH 26.3  MCHC 30.9  RDW 16.2*    Chemistries    Recent Labs Lab 10/04/12 0522  NA 141  K 3.7  CL 104  CO2 26  GLUCOSE 93  BUN 16  CREATININE 2.34*  CALCIUM 9.1   ------------------------------------------------------------------------------------------------------------------ No results found for this basename: HGBA1C,  in the last 72 hours ------------------------------------------------------------------------------------------------------------------ No results found for this basename: CHOL, HDL, LDLCALC, TRIG, CHOLHDL, LDLDIRECT,  in the last 72 hours ------------------------------------------------------------------------------------------------------------------ No results found for this basename: TSH, T4TOTAL, FREET3, T3FREE, THYROIDAB,  in the last 72 hours ------------------------------------------------------------------------------------------------------------------ No results  found for this basename: VITAMINB12, FOLATE, FERRITIN, TIBC, IRON, RETICCTPCT,  in the last 72 hours  Coagulation profile  No results found for this basename: INR, PROTIME,  in the last 168 hours    Assessment & Plan   Uncontrolled hypertension - One dose of 0.1 mg of clonidine stat, will monitor her in the clinic to make sure her blood pressure is going down - Given her stage III CK D., I will hold off on restarting lisinopril for now, and resume amlodipine 10 mg daily, change hydralazine to 50 mg 3 times a day (was twice a day), continue with Lasix 40 mg by mouth twice a day. Prescriptions for these medications will be provided the patient with refills. - We will check her back in one week, will make sure she gets a chemistry panel prior followup. If her renal function is back to her baseline, we could potentially restart ACE inhibitor, alternatively put her on Coreg.  Chronic kidney disease stage III - Nephrology referral  Chronic diastolic heart failure - Currently clinically compensated, on the above medications. Depending on her blood pressure and on followup next week  we'll decide whether or not the beta blocker can be added.  ? History of lupus - Patient claims she has a history this but is not on any medications for this.  Recent calculus cholecystitis - Surgical followup scheduled on 6/16.  Followup in one week

## 2012-10-10 NOTE — Telephone Encounter (Signed)
Called pt and left message, scripts sent.

## 2012-10-17 ENCOUNTER — Encounter: Payer: Self-pay | Admitting: Family Medicine

## 2012-10-17 ENCOUNTER — Ambulatory Visit: Payer: 59 | Attending: Family Medicine | Admitting: Family Medicine

## 2012-10-17 VITALS — BP 169/81 | HR 85 | Temp 99.1°F | Resp 18 | Wt 199.0 lb

## 2012-10-17 DIAGNOSIS — R809 Proteinuria, unspecified: Secondary | ICD-10-CM | POA: Insufficient documentation

## 2012-10-17 DIAGNOSIS — I509 Heart failure, unspecified: Secondary | ICD-10-CM | POA: Insufficient documentation

## 2012-10-17 DIAGNOSIS — I5022 Chronic systolic (congestive) heart failure: Secondary | ICD-10-CM

## 2012-10-17 DIAGNOSIS — I10 Essential (primary) hypertension: Secondary | ICD-10-CM | POA: Insufficient documentation

## 2012-10-17 DIAGNOSIS — M329 Systemic lupus erythematosus, unspecified: Secondary | ICD-10-CM | POA: Insufficient documentation

## 2012-10-17 LAB — CBC
Hemoglobin: 13.4 g/dL (ref 12.0–15.0)
MCH: 26.9 pg (ref 26.0–34.0)
Platelets: 353 10*3/uL (ref 150–400)
RBC: 4.99 MIL/uL (ref 3.87–5.11)
WBC: 9.6 10*3/uL (ref 4.0–10.5)

## 2012-10-17 LAB — POCT URINALYSIS DIPSTICK
Bilirubin, UA: NEGATIVE
Blood, UA: NEGATIVE
Glucose, UA: NEGATIVE
Leukocytes, UA: NEGATIVE
Nitrite, UA: NEGATIVE
Urobilinogen, UA: 0.2

## 2012-10-17 NOTE — Progress Notes (Signed)
Patient states she is here to review hypertensive medication and was instructed to have labs drawn by Dr. Neil Crouch.

## 2012-10-17 NOTE — Progress Notes (Signed)
Patient ID: Brittany Charles, female   DOB: May 05, 1951, 62 y.o.   MRN: [id number]  [id number] up   HPI: Pt reports that she has not taken her BP meds today.  Pt says that she feels fine.  She is planning to see nephrology tomorrow.  Pt denies CP and SOB.  Pt says that she is planning to take her BP meds when she gets home today.  No edema  In legs or feet.  The patient reports that she has not taken her medication for blood pressure today.  The patient denies exercise intolerance.   Allergies  Allergen Reactions  . Zithromax (Azithromycin) Nausea And Vomiting   Past Medical History  Diagnosis Date  . CHF (congestive heart failure)   . Lupus (systemic lupus erythematosus)   . Hypertension   . Fibroids   . Renal insufficiency    Current Outpatient Prescriptions on File Prior to Visit  Medication Sig Dispense Refill  . acetaminophen (TYLENOL) 325 MG tablet Take 650 mg by mouth every 6 (six) hours as needed for pain.      Marland Kitchen amLODipine (NORVASC) 5 MG tablet Take 2 tablets (10 mg total) by mouth daily.  30 tablet  1  . furosemide (LASIX) 40 MG tablet Take 1 tablet (40 mg total) by mouth 2 (two) times daily.  60 tablet  2  . hydrALAZINE (APRESOLINE) 50 MG tablet Take 1 tablet (50 mg total) by mouth 3 (three) times daily.  90 tablet  2  . potassium chloride SA (K-DUR,KLOR-CON) 20 MEQ tablet Take 20 mEq by mouth 2 (two) times daily.      Marland Kitchen HYDROcodone-acetaminophen (NORCO/VICODIN) 5-325 MG per tablet Take 1-2 tablets by mouth every 4 (four) hours as needed.  65 tablet  0  . ondansetron (ZOFRAN) 4 MG tablet Take 1 tablet (4 mg total) by mouth every 6 (six) hours as needed for nausea.  65 tablet  0   No current facility-administered medications on file prior to visit.   Family History  Problem Relation Age of Onset  . Hypertension Mother   . Hyperlipidemia Father   . Colon cancer    . Breast cancer    . Heart disease Mother     CHF   History   Social History  . Marital Status: Single     Spouse Name: N/A    Number of Children: N/A  . Years of Education: N/A   Occupational History  . Not on file.   Social History Main Topics  . Smoking status: Never Smoker   . Smokeless tobacco: Never Used  . Alcohol Use: No  . Drug Use: No  . Sexually Active: No   Other Topics Concern  . Not on file   Social History Narrative  . No narrative on file    Review of Systems  Constitutional: Negative for fever, chills, diaphoresis, activity change, appetite change and fatigue.  HENT: Negative for ear pain, nosebleeds, congestion, facial swelling, rhinorrhea, neck pain, neck stiffness and ear discharge.   Eyes: Negative for pain, discharge, redness, itching and visual disturbance.  Respiratory: Negative for cough, choking, chest tightness, shortness of breath, wheezing and stridor.   Cardiovascular: Negative for chest pain, palpitations and leg swelling.  Gastrointestinal: Negative for abdominal distention.  Genitourinary: Negative for dysuria, urgency, frequency, hematuria, flank pain, decreased urine volume, difficulty urinating and dyspareunia.  Musculoskeletal: Negative for back pain, joint swelling, arthralgias and gait problem.  Neurological: Negative for dizziness, tremors, seizures, syncope, facial asymmetry, speech difficulty,  weakness, light-headedness, numbness and headaches.  Hematological: Negative for adenopathy. Does not bruise/bleed easily.  Psychiatric/Behavioral: Negative for hallucinations, behavioral problems, confusion, dysphoric mood, decreased concentration and agitation.    Objective:   Filed Vitals:   10/17/12 1647  BP: 169/81  Pulse: 85  Temp: 99.1 F (37.3 C)  Resp: 18    Physical Exam  Constitutional: Appears well-developed and well-nourished. No distress.  HENT: Normocephalic. External right and left ear normal. Oropharynx is clear and moist.  Eyes: Conjunctivae and EOM are normal. PERRLA, no scleral icterus.  Neck: Normal ROM. Neck supple.  No JVD. No tracheal deviation. No thyromegaly.  CVS: RRR, S1/S2 +, no murmurs, no gallops, no carotid bruit.  Pulmonary: Effort and breath sounds normal, no stridor, rhonchi, wheezes, rales.  Abdominal: Soft. BS +,  no distension, tenderness, rebound or guarding.  Musculoskeletal: Normal range of motion.  Trace edema bilateral lower extremity nonpitting and no tenderness.  Lymphadenopathy: No lymphadenopathy noted, cervical, inguinal. Neuro: Alert. Normal reflexes, muscle tone coordination. No cranial nerve deficit. Skin: Skin is warm and dry. No rash noted. Not diaphoretic. No erythema. No pallor.  Psychiatric: Normal mood and affect. Behavior, judgment, thought content normal.   Lab Results  Component Value Date   WBC 9.6 10/04/2012   HGB 12.5 10/04/2012   HCT 40.5 10/04/2012   MCV 85.1 10/04/2012   PLT 365 10/04/2012   Lab Results  Component Value Date   CREATININE 2.34* 10/04/2012   BUN 16 10/04/2012   NA 141 10/04/2012   K 3.7 10/04/2012   CL 104 10/04/2012   CO2 26 10/04/2012    No results found for this basename: HGBA1C   Lipid Panel     Component Value Date/Time   CHOL 136 04/17/2011 0700   TRIG 113 04/17/2011 0700   HDL 22* 04/17/2011 0700   CHOLHDL 6.2 04/17/2011 0700   VLDL 23 04/17/2011 0700   LDLCALC 91 04/17/2011 0700       Assessment and plan:   Patient Active Problem List   Diagnosis Date Noted  . RUQ pain 10/02/2012  . Systolic CHF 0000000  . Shortness of breath 04/22/2011  . CKD (chronic kidney disease), stage III 04/22/2011  . Cough 04/17/2011  . Hypertension 04/17/2011  . Gout 04/17/2011  . Lupus (systemic lupus erythematosus) 04/17/2011   Pt to see a nephrologist at [place] strongly advised to keep that appointment The patient reports that her lupus has been in remission for quite some time.  She was seen and followed at [facility] in past years Continue current mgmt The patient was given written information regarding chronic  kidney disease today Will check a vitamin D level Check labs today The patient was strongly advised to take her blood pressure medications as prescribed. RTC in 2 months  The patient was given clear instructions to go to ER or return to medical center if symptoms don't improve, worsen or new problems develop.  The patient verbalized understanding.  The patient was told to call to get lab results if they haven't heard anything in the next week.    Gerlene Fee, MD, CDE, [place], [place]

## 2012-10-17 NOTE — Patient Instructions (Signed)
DASH Diet The DASH diet stands for "Dietary Approaches to Stop Hypertension." It is a healthy eating plan that has been shown to reduce high blood pressure (hypertension) in as little as 14 days, while also possibly providing other significant health benefits. These other health benefits include reducing the risk of breast cancer after menopause and reducing the risk of type 2 diabetes, heart disease, colon cancer, and stroke. Health benefits also include weight loss and slowing kidney failure in patients with chronic kidney disease.  DIET GUIDELINES  Limit salt (sodium). Your diet should contain less than 1500 mg of sodium daily.  Limit refined or processed carbohydrates. Your diet should include mostly whole grains. Desserts and added sugars should be used sparingly.  Include small amounts of heart-healthy fats. These types of fats include nuts, oils, and tub margarine. Limit saturated and trans fats. These fats have been shown to be harmful in the body. CHOOSING FOODS  The following food groups are based on a 2000 calorie diet. See your Registered Dietitian for individual calorie needs. Grains and Grain Products (6 to 8 servings daily)  Eat More Often: Whole-wheat bread, brown rice, whole-grain or wheat pasta, quinoa, popcorn without added fat or salt (air popped).  Eat Less Often: White bread, white pasta, white rice, cornbread. Vegetables (4 to 5 servings daily)  Eat More Often: Fresh, frozen, and canned vegetables. Vegetables may be raw, steamed, roasted, or grilled with a minimal amount of fat.  Eat Less Often/Avoid: Creamed or fried vegetables. Vegetables in a cheese sauce. Fruit (4 to 5 servings daily)  Eat More Often: All fresh, canned (in natural juice), or frozen fruits. Dried fruits without added sugar. One hundred percent fruit juice ( cup [237 mL] daily).  Eat Less Often: Dried fruits with added sugar. Canned fruit in light or heavy syrup. Lean Meats, Fish, and Poultry (2  servings or less daily. One serving is 3 to 4 oz [85-114 g]).  Eat More Often: Ninety percent or leaner ground beef, tenderloin, sirloin. Round cuts of beef, chicken breast, turkey breast. All fish. Grill, bake, or broil your meat. Nothing should be fried.  Eat Less Often/Avoid: Fatty cuts of meat, turkey, or chicken leg, thigh, or wing. Fried cuts of meat or fish. Dairy (2 to 3 servings)  Eat More Often: Low-fat or fat-free milk, low-fat plain or light yogurt, reduced-fat or part-skim cheese.  Eat Less Often/Avoid: Milk (whole, 2%).Whole milk yogurt. Full-fat cheeses. Nuts, Seeds, and Legumes (4 to 5 servings per week)  Eat More Often: All without added salt.  Eat Less Often/Avoid: Salted nuts and seeds, canned beans with added salt. Fats and Sweets (limited)  Eat More Often: Vegetable oils, tub margarines without trans fats, sugar-free gelatin. Mayonnaise and salad dressings.  Eat Less Often/Avoid: Coconut oils, palm oils, butter, stick margarine, cream, half and half, cookies, candy, pie. FOR MORE INFORMATION The Dash Diet Eating Plan: www.dashdiet.org Document Released: 04/16/2011 Document Revised: 07/20/2011 Document Reviewed: 04/16/2011 [employer] Patient Information 2014 [employer]. Hypertension As your heart beats, it forces blood through your arteries. This force is your blood pressure. If the pressure is too high, it is called hypertension (HTN) or high blood pressure. HTN is dangerous because you may have it and not know it. High blood pressure may mean that your heart has to work harder to pump blood. Your arteries may be narrow or stiff. The extra work puts you at risk for heart disease, stroke, and other problems.  Blood pressure consists of two numbers, a   higher number over a lower, 110/72, for example. It is stated as "110 over 72." The ideal is below 120 for the top number (systolic) and under 80 for the bottom (diastolic). Write down your blood pressure today. You  should pay close attention to your blood pressure if you have certain conditions such as:  Heart failure.  Prior heart attack.  Diabetes  Chronic kidney disease.  Prior stroke.  Multiple risk factors for heart disease. To see if you have HTN, your blood pressure should be measured while you are seated with your arm held at the level of the heart. It should be measured at least twice. A one-time elevated blood pressure reading (especially in the Emergency Department) does not mean that you need treatment. There may be conditions in which the blood pressure is different between your right and left arms. It is important to see your caregiver soon for a recheck. Most people have essential hypertension which means that there is not a specific cause. This type of high blood pressure may be lowered by changing lifestyle factors such as:  Stress.  Smoking.  Lack of exercise.  Excessive weight.  Drug/tobacco/alcohol use.  Eating less salt. Most people do not have symptoms from high blood pressure until it has caused damage to the body. Effective treatment can often prevent, delay or reduce that damage. TREATMENT  When a cause has been identified, treatment for high blood pressure is directed at the cause. There are a large number of medications to treat HTN. These fall into several categories, and your caregiver will help you select the medicines that are best for you. Medications may have side effects. You should review side effects with your caregiver. If your blood pressure stays high after you have made lifestyle changes or started on medicines,   Your medication(s) may need to be changed.  Other problems may need to be addressed.  Be certain you understand your prescriptions, and know how and when to take your medicine.  Be sure to follow up with your caregiver within the time frame advised (usually within two weeks) to have your blood pressure rechecked and to review your  medications.  If you are taking more than one medicine to lower your blood pressure, make sure you know how and at what times they should be taken. Taking two medicines at the same time can result in blood pressure that is too low. SEEK IMMEDIATE MEDICAL CARE IF:  You develop a severe headache, blurred or changing vision, or confusion.  You have unusual weakness or numbness, or a faint feeling.  You have severe chest or abdominal pain, vomiting, or breathing problems. MAKE SURE YOU:   Understand these instructions.  Will watch your condition.  Will get help right away if you are not doing well or get worse. Document Released: 04/27/2005 Document Revised: 07/20/2011 Document Reviewed: 12/16/2007 [facility] Patient Information 2014 [place]. Chronic Kidney Disease Chronic kidney disease occurs when the kidneys are damaged over a long period. The kidneys are two organs that lie on either side of the spine between the middle of the back and the front of the abdomen. The kidneys:   Remove wastes and extra water from the blood.   Produce important hormones. These help keep bones strong, regulate blood pressure, and help create red blood cells.   Balance the fluids and chemicals in the blood and tissues. A small amount of kidney damage may not cause problems, but a large amount of damage may make it  difficult or impossible for the kidneys to work the way they should. If steps are not taken to slow down the kidney damage or stop it from getting worse, the kidneys may stop working permanently. Most of the time, chronic kidney disease does not go away. However, it can often be controlled, and those with the disease can usually live normal lives. CAUSES  The most common causes of chronic kidney disease are diabetes and high blood pressure (hypertension). Chronic kidney disease may also be caused by:   Diseases that cause kidneys' filters to become inflamed.   Diseases that affect the  immune system.   Genetic diseases.   Medicines that damage the kidneys, such as anti-inflammatory medicines.  Poisoning or exposure to toxic substances.   A reoccurring kidney or urinary infection.   A problem with urine flow. This may be caused by:   Cancer.   Kidney stones.   An enlarged prostate in males. SYMPTOMS  Because the kidney damage in chronic kidney disease occurs slowly, symptoms develop slowly and may not be obvious until the kidney damage becomes severe. A person may have a kidney disease for years without showing any symptoms. Symptoms can include:   Swelling (edema) of the legs, ankles, or feet.   Tiredness (lethargy).   Nausea or vomiting.   Confusion.   Problems with urination, such as:   Decreased urine production.   Frequent urination, especially at night.   Frequent accidents in children who are potty trained.   Muscle twitches and cramps.   Shortness of breath.  Weakness.   Persistent itchiness.   Loss of appetite.  Metallic taste in the mouth.  Trouble sleeping.  Slowed development in children.  Short stature in children. DIAGNOSIS  Chronic kidney disease may be detected and diagnosed by tests, including blood, urine, imaging, or kidney biopsy tests.  TREATMENT  Most chronic kidney diseases cannot be cured. Treatment usually involves relieving symptoms and preventing or slowing the progression of the disease. Treatment may include:   A special diet. You may need to avoid alcohol and foods thatare salty and high in potassium.   Medicines. These may:   Lower blood pressure.   Relieve anemia.   Relieve swelling.   Protect the bones. HOME CARE INSTRUCTIONS   Follow your prescribed diet.   Only take over-the-counter or prescription medicines as directed by your caregiver.  Do not take any new medicines (prescription, over-the-counter, or nutritional supplements) unless approved by your  caregiver. Many medicines can worsen your kidney damage or need to have the dose adjusted.   Quit smoking if you are a smoker. Talk to your caregiver about a smoking cessation program.   Keep all follow-up appointments as directed by your caregiver. SEEK IMMEDIATE MEDICAL CARE IF:  Your symptoms get worse or you develop new symptoms.   You develop symptoms of end-stage kidney disease. These include:   Headaches.   Abnormally dark or light skin.   Numbness in the hands or feet.   Easy bruising.   Frequent hiccups.   Menstruation stops.   You have a fever.   You have decreased urine production.   You havepain or bleeding when urinating. MAKE SURE YOU:  Understand these instructions.  Will watch your condition.  Will get help right away if you are not doing well or get worse. FOR MORE INFORMATION  American Association of Kidney Patients: BombTimer.gl National Kidney Foundation: www.kidney.[place]: https://mathis.com/ [facility]: www.lifeoptions.org and www.kidneyschool.org Document Released: 02/04/2008  Document Revised: 04/13/2012 Document Reviewed: 12/25/2011 [facility] Patient Information 2014 [place].

## 2012-10-18 LAB — COMPLETE METABOLIC PANEL WITH GFR
ALT: 11 U/L (ref 0–35)
BUN: 32 mg/dL — ABNORMAL HIGH (ref 6–23)
CO2: 26 mEq/L (ref 19–32)
Calcium: 9.3 mg/dL (ref 8.4–10.5)
Chloride: 104 mEq/L (ref 96–112)
Creat: 2.9 mg/dL — ABNORMAL HIGH (ref 0.50–1.10)
GFR, Est African American: 20 mL/min — ABNORMAL LOW
GFR, Est Non African American: 17 mL/min — ABNORMAL LOW
Glucose, Bld: 101 mg/dL — ABNORMAL HIGH (ref 70–99)
Total Bilirubin: 0.3 mg/dL (ref 0.3–1.2)

## 2012-10-18 LAB — VITAMIN D 25 HYDROXY (VIT D DEFICIENCY, FRACTURES): Vit D, 25-Hydroxy: 30 ng/mL (ref 30–89)

## 2012-10-18 NOTE — Progress Notes (Signed)
Quick Note:  Please inform patient that her labs show renal insufficiency. Other labs came back OK. Please follow up with kidney specialists.   Gerlene Fee, MD, CDE, [place], [place]   ______

## 2012-10-18 NOTE — Progress Notes (Signed)
Quick Note:  Please inform patient that her TSH level came back within normal limits.  Gerlene Fee, MD, CDE, [place], [place]   ______

## 2012-10-19 ENCOUNTER — Telehealth: Payer: Self-pay

## 2012-10-19 NOTE — Telephone Encounter (Signed)
Patient was notified of lab results.

## 2012-10-21 ENCOUNTER — Telehealth (INDEPENDENT_AMBULATORY_CARE_PROVIDER_SITE_OTHER): Payer: Self-pay | Admitting: General Surgery

## 2012-10-21 NOTE — Telephone Encounter (Signed)
Chris Marshall-Nephrologist called with patient's Lab Values. Patient's Creatinine on 6/9 was 2.8. During her hospital stay her Creatinine was 2.3. Patient has appointment 10/24/2012 at 4:20 pm with Dr.Gerkin for Acute Chole Cystitis. He recommended to hold if surgery was decided until patient is stable. His direct call back number is [phone] if he needs to be called on Monday.

## 2012-10-24 ENCOUNTER — Telehealth (INDEPENDENT_AMBULATORY_CARE_PROVIDER_SITE_OTHER): Payer: Self-pay

## 2012-10-24 ENCOUNTER — Encounter (INDEPENDENT_AMBULATORY_CARE_PROVIDER_SITE_OTHER): Payer: Self-pay | Admitting: Surgery

## 2012-10-24 NOTE — Telephone Encounter (Signed)
LMOM for pt to call re: appt today. Per Dr Harlow Asa surgery must be delayed due to abn kidney function per Dr Ruthann Cancer. Pt can r/s appt once Dr Ruthann Cancer her kidney MD has cleared her.

## 2012-10-24 NOTE — Telephone Encounter (Signed)
Will see at [place] office to discuss cholecystectomy when medical stable and cleared by her primary medical physician.  Earnstine Regal, MD, [facility] Office: [phone]

## 2012-11-04 ENCOUNTER — Encounter (INDEPENDENT_AMBULATORY_CARE_PROVIDER_SITE_OTHER): Payer: Self-pay | Admitting: Surgery

## 2012-12-19 ENCOUNTER — Ambulatory Visit: Payer: 59

## 2013-06-12 ENCOUNTER — Emergency Department (HOSPITAL_COMMUNITY)
Admission: EM | Admit: 2013-06-12 | Discharge: 2013-06-13 | Disposition: A | Discharge status: Home / Self Care | Payer: Medicare Other | Attending: Emergency Medicine | Admitting: Emergency Medicine

## 2013-06-12 DIAGNOSIS — M329 Systemic lupus erythematosus, unspecified: Secondary | ICD-10-CM | POA: Insufficient documentation

## 2013-06-12 DIAGNOSIS — Z91199 Patient's noncompliance with other medical treatment and regimen due to unspecified reason: Secondary | ICD-10-CM | POA: Insufficient documentation

## 2013-06-12 DIAGNOSIS — I129 Hypertensive chronic kidney disease with stage 1 through stage 4 chronic kidney disease, or unspecified chronic kidney disease: Secondary | ICD-10-CM | POA: Insufficient documentation

## 2013-06-12 DIAGNOSIS — I4892 Unspecified atrial flutter: Secondary | ICD-10-CM

## 2013-06-12 DIAGNOSIS — Z9119 Patient's noncompliance with other medical treatment and regimen: Secondary | ICD-10-CM | POA: Insufficient documentation

## 2013-06-12 DIAGNOSIS — N289 Disorder of kidney and ureter, unspecified: Secondary | ICD-10-CM | POA: Insufficient documentation

## 2013-06-12 DIAGNOSIS — I509 Heart failure, unspecified: Secondary | ICD-10-CM | POA: Insufficient documentation

## 2013-06-12 DIAGNOSIS — Z79899 Other long term (current) drug therapy: Secondary | ICD-10-CM | POA: Insufficient documentation

## 2013-06-12 NOTE — ED Notes (Signed)
Pt. woke up this evening with palpitations " heart racing " with SOB , no chest pain .

## 2013-06-13 ENCOUNTER — Other Ambulatory Visit: Payer: Self-pay

## 2013-06-13 ENCOUNTER — Encounter (HOSPITAL_COMMUNITY): Payer: Self-pay | Admitting: Emergency Medicine

## 2013-06-13 LAB — CBC WITH DIFFERENTIAL/PLATELET
BASOS PCT: 1 % (ref 0–1)
Basophils Absolute: 0.1 10*3/uL (ref 0.0–0.1)
Eosinophils Absolute: 0.2 10*3/uL (ref 0.0–0.7)
Eosinophils Relative: 2 % (ref 0–5)
HEMATOCRIT: 44.6 % (ref 36.0–46.0)
HEMOGLOBIN: 14.4 g/dL (ref 12.0–15.0)
LYMPHS ABS: 2.7 10*3/uL (ref 0.7–4.0)
Lymphocytes Relative: 24 % (ref 12–46)
MCH: 27 pg (ref 26.0–34.0)
MCHC: 32.3 g/dL (ref 30.0–36.0)
MCV: 83.5 fL (ref 78.0–100.0)
MONO ABS: 0.6 10*3/uL (ref 0.1–1.0)
MONOS PCT: 5 % (ref 3–12)
NEUTROS ABS: 7.6 10*3/uL (ref 1.7–7.7)
Neutrophils Relative %: 68 % (ref 43–77)
Platelets: 304 10*3/uL (ref 150–400)
RBC: 5.34 MIL/uL — AB (ref 3.87–5.11)
RDW: 15.3 % (ref 11.5–15.5)
WBC: 11.1 10*3/uL — ABNORMAL HIGH (ref 4.0–10.5)

## 2013-06-13 LAB — POCT I-STAT, CHEM 8
BUN: 38 mg/dL — AB (ref 6–23)
CALCIUM ION: 1.17 mmol/L (ref 1.13–1.30)
CHLORIDE: 108 meq/L (ref 96–112)
CREATININE: 2.7 mg/dL — AB (ref 0.50–1.10)
GLUCOSE: 108 mg/dL — AB (ref 70–99)
HEMATOCRIT: 49 % — AB (ref 36.0–46.0)
Hemoglobin: 16.7 g/dL — ABNORMAL HIGH (ref 12.0–15.0)
Potassium: 4.1 mEq/L (ref 3.7–5.3)
Sodium: 142 mEq/L (ref 137–147)
TCO2: 22 mmol/L (ref 0–100)

## 2013-06-13 MED ORDER — DILTIAZEM HCL ER COATED BEADS 120 MG PO CP24
120.0000 mg | ORAL_CAPSULE | Freq: Once | ORAL | Status: AC
  Administered 2013-06-13: 120 mg via ORAL
  Filled 2013-06-13: qty 1

## 2013-06-13 MED ORDER — DILTIAZEM HCL ER COATED BEADS 120 MG PO CP24: 120.0000 mg | ORAL_CAPSULE | Freq: Once | ORAL | Status: DC

## 2013-06-13 MED ORDER — HYDRALAZINE HCL 25 MG PO TABS: 50.0000 mg | ORAL_TABLET | Freq: Three times a day (TID) | ORAL | Status: DC

## 2013-06-13 MED ORDER — DILTIAZEM HCL 25 MG/5ML IV SOLN
20.0000 mg | Freq: Once | INTRAVENOUS | Status: AC
  Administered 2013-06-13: 20 mg via INTRAVENOUS
  Filled 2013-06-13: qty 5

## 2013-06-13 NOTE — ED Notes (Signed)
Alert, NAD, calm, interactive, "feels better", denies palpitations or other sx at this time, family at [facility], VSS, HR 82.

## 2013-06-13 NOTE — ED Notes (Signed)
Pt into room via w/c from triage, EKG shows SVT 172, alert, NAD, calm, interactive, skin W&D, resps e/u, speakingin clear complete sentences, no dyspnea noted, 98% RA, HR 174 on monitor, pt attempting vagal maneuvers, with intermittent success, pt admits to palpitations with some sob and some dizziness. (Denies: recent sickness, fever, nvd). Dr. Winfred Leeds into room just after arrival prior to completion of RN assessment, see MD notes.

## 2013-06-13 NOTE — ED Provider Notes (Signed)
CSN: [id number]     Arrival date & time 06/12/13  2356 History   First MD Initiated Contact with Patient 06/13/13 0009     Chief Complaint  Patient presents with  . Palpitations   (Consider location/radiation/quality/duration/timing/severity/associated sxs/prior Treatment) HPI Patient complains of palpitations with racing heart onset 10 minutes prior to arrival she denies any shortness of breath denies chest pain denies nausea at denies other symptoms. No treatment prior to coming here. She's never had similar symptoms before. Nothing makes symptoms better or worse . No pain anywhere. He does admit to noncompliance with 2 of her3 blood pressure medications, stating lack of funds but states "I just got my Medicaid card and can get my medications tomorrow. Past Medical History  Diagnosis Date  . CHF (congestive heart failure)   . Lupus (systemic lupus erythematosus)   . Hypertension   . Fibroids   . Renal insufficiency    Past Surgical History  Procedure Laterality Date  . Tonsillectomy    . Abdominal hysterectomy  10/27/2000    TAH, BSO   Family History  Problem Relation Age of Onset  . Hypertension Mother   . Hyperlipidemia Father   . Colon cancer    . Breast cancer    . Heart disease Mother     CHF   History  Substance Use Topics  . Smoking status: Never Smoker   . Smokeless tobacco: Never Used  . Alcohol Use: No   OB History   Grav Para Term Preterm Abortions TAB SAB Ect Mult Living                 Review of Systems  Constitutional: Negative.   HENT: Negative.   Respiratory: Negative.   Cardiovascular: Positive for palpitations.  Gastrointestinal: Negative.   Musculoskeletal: Negative.   Skin: Negative.   Neurological: Negative.   Psychiatric/Behavioral: Negative.   All other systems reviewed and are negative.    Allergies  Zithromax  Home Medications   Current Outpatient Rx  Name  Route  Sig  Dispense  Refill  . acetaminophen (TYLENOL) 325 MG tablet    Oral   Take 650 mg by mouth every 6 (six) hours as needed for pain.         Marland Kitchen amLODipine (NORVASC) 5 MG tablet   Oral   Take 2 tablets (10 mg total) by mouth daily.   30 tablet   1   . furosemide (LASIX) 40 MG tablet   Oral   Take 1 tablet (40 mg total) by mouth 2 (two) times daily.   60 tablet   2   . hydrALAZINE (APRESOLINE) 50 MG tablet   Oral   Take 1 tablet (50 mg total) by mouth 3 (three) times daily.   90 tablet   2   . HYDROcodone-acetaminophen (NORCO/VICODIN) 5-325 MG per tablet   Oral   Take 1-2 tablets by mouth every 4 (four) hours as needed.   65 tablet   0   . ondansetron (ZOFRAN) 4 MG tablet   Oral   Take 1 tablet (4 mg total) by mouth every 6 (six) hours as needed for nausea.   65 tablet   0   . potassium chloride SA (K-DUR,KLOR-CON) 20 MEQ tablet   Oral   Take 20 mEq by mouth 2 (two) times daily.          BP 182/103  Pulse 162  Temp(Src) 98.3 F (36.8 C) (Oral)  Resp 14  Ht 5\' 5"  (1.651 m)  Wt 210 lb (95.255 kg)  BMI 34.95 kg/m2  SpO2 98% Physical Exam  Nursing note and vitals reviewed. Constitutional: She appears well-developed and well-nourished.  HENT:  Head: Normocephalic and atraumatic.  Eyes: Conjunctivae are normal. Pupils are equal, round, and reactive to light.  Neck: Neck supple. No tracheal deviation present. No thyromegaly present.  Cardiovascular: Regular rhythm.   No murmur heard. Tachycardic  Pulmonary/Chest: Effort normal and breath sounds normal.  Abdominal: Soft. Bowel sounds are normal. She exhibits no distension. There is no tenderness.  Obese  Musculoskeletal: Normal range of motion. She exhibits no edema and no tenderness.  Neurological: She is alert. Coordination normal.  Skin: Skin is warm and dry. No rash noted.  Psychiatric: She has a normal mood and affect.    ED Course  Procedures (including critical care time) Labs Review Labs Reviewed  CBC WITH DIFFERENTIAL   Imaging Review No results  found.  EKG Interpretation    Date/Time:  Tuesday June 13 2013 01:28:03 EST Ventricular Rate:  82 PR Interval:  171 QRS Duration: 91 QT Interval:  417 QTC Calculation: 487 R Axis:   -39 Text Interpretation:  Sinus rhythm Left atrial enlargement Abnormal R-wave progression, late transition Left ventricular hypertrophy Nonspecific T abnormalities, lateral leads Borderline prolonged QT interval Since last tracing rate slower Confirmed by Winfred Leeds  MD, Lakeith Careaga ([id number]) on 06/13/2013 2:45:11 AM           Date: 06/13/2013  Rate: 160  Rhythm: supraventricular tachycardia (SVT)  QRS Axis: left  Intervals: normal  ST/T Wave abnormalities: nonspecific T wave changes  Conduction Disutrbances:none  Narrative Interpretation:   Old EKG Reviewed: Tracing from 04/17/2011 showed normal sinus rhythm, normal axis poor R wave progression rate has increased since last tracing. Results for orders placed during the hospital encounter of 06/12/13  CBC WITH DIFFERENTIAL      Result Value Range   WBC 11.1 (*) 4.0 - 10.5 K/uL   RBC 5.34 (*) 3.87 - 5.11 MIL/uL   Hemoglobin 14.4  12.0 - 15.0 g/dL   HCT 44.6  36.0 - 46.0 %   MCV 83.5  78.0 - 100.0 fL   MCH 27.0  26.0 - 34.0 pg   MCHC 32.3  30.0 - 36.0 g/dL   RDW 15.3  11.5 - 15.5 %   Platelets 304  150 - 400 K/uL   Neutrophils Relative % 68  43 - 77 %   Neutro Abs 7.6  1.7 - 7.7 K/uL   Lymphocytes Relative 24  12 - 46 %   Lymphs Abs 2.7  0.7 - 4.0 K/uL   Monocytes Relative 5  3 - 12 %   Monocytes Absolute 0.6  0.1 - 1.0 K/uL   Eosinophils Relative 2  0 - 5 %   Eosinophils Absolute 0.2  0.0 - 0.7 K/uL   Basophils Relative 1  0 - 1 %   Basophils Absolute 0.1  0.0 - 0.1 K/uL  POCT I-STAT, CHEM 8      Result Value Range   Sodium 142  137 - 147 mEq/L   Potassium 4.1  3.7 - 5.3 mEq/L   Chloride 108  96 - 112 mEq/L   BUN 38 (*) 6 - 23 mg/dL   Creatinine, Ser 2.70 (*) 0.50 - 1.10 mg/dL   Glucose, Bld 108 (*) 70 - 99 mg/dL   Calcium, Ion 1.17  1.13  - 1.30 mmol/L   TCO2 22  0 - 100 mmol/L   Hemoglobin 16.7 (*) 12.0 -  15.0 g/dL   HCT 49.0 (*) 36.0 - 46.0 %   No results found.  On the cardiac monitor patient's rhythm was atrial flutter with variable block. Intravenous Cardizem ordered  12:35 AM Patient converted to normal sinus rhythm after treatment with intravenous Cardizem, and has remained in sinus rhythm as of 2:50 AM. MDM  No diagnosis found. Case discussed With Dr.Balfour plan prescription Cardizem CD 120 mg daily. Discontinue amlodipine. I will write prescription for hydralazine. To be seen in cardiologist's office within the next few days Diagnosis #1 atrial flutter #2 hypertension #3 medication noncompliance #4 renal insufficiency   Orlie Dakin, MD 06/13/13 [phone]

## 2013-06-13 NOTE — ED Notes (Signed)
Pt noted to be in SVT at a rate of 160 in triage.  Pt vitals taken and transported directly to pod D room 33.  Dr. Winfred Leeds made aware of pts EKG.  EKG handed to him by Evelena Asa EMT and he handed it back to place in pts room.

## 2013-06-13 NOTE — ED Notes (Signed)
No changes, assisted to BP.

## 2013-06-13 NOTE — Discharge Instructions (Signed)
Cardiac Arrhythmia Stop taking Norvasc (amlodipine). Take the medication prescribed tonight instead .Dr. Antionette Char office will call you to schedule an appointment. If you don't hear from the office by tomorrow afternoon, call to schedule appointment. Tell office staff that you were seen here Your heart is a muscle that works to pump blood through your body by regular contractions. The beating of your heart is controlled by a system of special pacemaker cells. These cells control the electrical activity of the heart. When the system controlling this regular beating is disturbed, a heart rhythm abnormality (arrhythmia) results. WHEN YOUR HEART SKIPS A BEAT One of the most common and least serious heart arrhythmias is called an ectopic or premature atrial heartbeat (PAC). This may be noticed as a small change in your regular pulse. A PAC originates from the top part (atrium) of the heart. Within the right atrium, the SA node is the area that normally controls the regularity of the heart. PACs occur in heart tissue outside of the SA node region. You may feel this as a skipped beat or heart flutter, especially if several occur in succession or occur frequently.  Another arrhythmia is ventricular premature complex (VCP or PVC). These extra beats start out in the bottom, more muscular chambers of the heart. In most cases a PVC is harmless. If there are underlying causes that are making the heart irritable such as an overactive thyroid or a prior heart attack PVCs may be of more concern. In a few cases, medications to control the heart rhythm may be prescribed. Things to try at home:  Cut down or avoid alcohol, tobacco and caffeine.  Get enough sleep.  Reduce stress.  Exercise more. WHEN THE HEART BEATS TOO FAST Atrial tachycardia is a fast heart rate, which starts out in the atrium. It may last from minutes to much longer. Your heart may beat 140 to 240 times per minute instead of the normal 60 to  100.  Symptoms include a worried feeling (anxiety) and a sense that your heart is beating fast and hard.  You may be able to stop the fast rate by holding your breath or bearing down as if you were going to have a bowel movement.  This type of fast rate is usually not dangerous. Atrial fibrillation and atrial flutter are other fast rhythms that start in the atria. Both conditions keep the atria from filling with enough blood so the heart does not work well.  Symptoms include feeling light-headed or faint.  These fast rates may be the result of heart damage or disease. Too much thyroid hormone may play a role.  There may be no clear cause or it may be from heart disease or damage.  Medication or a special electrical treatment (cardioversion) may be needed to get the heart beating normally. Ventricular tachycardia is a fast heart rate that starts in the lower muscular chambers (ventricles) This is a serious disorder that requires treatment as soon as possible. You need someone else to get and use a small defibrillator.  Symptoms include collapse, chest pain, or being short of breath.  Treatment may include medication, procedures to improve blood flow to the heart, or an implantable cardiac defibrillator (ICD). DIAGNOSIS   A cardiogram (EKG or ECG) will be done to see the arrhythmia, as well as lab tests to check the underlying cause.  If the extra beats or fast rate come and go, you may wear a Holter monitor that records your heart rate for a longer  period of time. SEEK MEDICAL CARE IF:  You have irregular or fast heartbeats (palpitations).  You experience skipped beats.  You develop lightheadedness.  You have chest discomfort.  You have shortness of breath.  You have more frequent episodes, if you are already being treated. SEEK IMMEDIATE MEDICAL CARE IF:   You have severe chest pain, especially if the pain is crushing or pressure-like and spreads to the arms, back, neck, or  jaw, or if you have sweating, feeling sick to your stomach (nausea), or shortness of breath. THIS IS AN EMERGENCY. Do not wait to see if the pain will go away. Get medical help at once. Call 911 or 0 (operator). DO NOT drive yourself to the hospital.  You feel dizzy or faint.  You have episodes of previously documented atrial tachycardia that do not resolve with the techniques your caregiver has taught you.  Irregular or rapid heartbeats begin to occur more often than in the past, especially if they are associated with more pronounced symptoms or of longer duration. Document Released: 04/27/2005 Document Revised: 07/20/2011 Document Reviewed: 12/14/2007 [facility] Patient Information 2014 [place].

## 2013-07-13 ENCOUNTER — Other Ambulatory Visit: Payer: Self-pay | Admitting: *Deleted

## 2013-07-13 MED ORDER — HYDRALAZINE HCL 50 MG PO TABS: 50.0000 mg | ORAL_TABLET | Freq: Three times a day (TID) | ORAL | Status: DC

## 2013-07-13 MED ORDER — FUROSEMIDE 40 MG PO TABS: 40.0000 mg | ORAL_TABLET | Freq: Two times a day (BID) | ORAL | Status: DC

## 2013-07-13 MED ORDER — DILTIAZEM HCL ER COATED BEADS 120 MG PO CP24: 120.0000 mg | ORAL_CAPSULE | Freq: Once | ORAL | Status: DC

## 2013-08-08 ENCOUNTER — Encounter: Payer: Self-pay | Admitting: Cardiology

## 2013-08-08 ENCOUNTER — Other Ambulatory Visit: Payer: Self-pay | Admitting: *Deleted

## 2013-08-08 ENCOUNTER — Ambulatory Visit (INDEPENDENT_AMBULATORY_CARE_PROVIDER_SITE_OTHER): Payer: Medicare Other | Admitting: Cardiology

## 2013-08-08 VITALS — BP 190/92 | HR 73 | Ht 65.0 in | Wt 196.0 lb

## 2013-08-08 DIAGNOSIS — I10 Essential (primary) hypertension: Secondary | ICD-10-CM

## 2013-08-08 DIAGNOSIS — I471 Supraventricular tachycardia: Secondary | ICD-10-CM

## 2013-08-08 DIAGNOSIS — N183 Chronic kidney disease, stage 3 unspecified: Secondary | ICD-10-CM

## 2013-08-08 DIAGNOSIS — I5020 Unspecified systolic (congestive) heart failure: Secondary | ICD-10-CM

## 2013-08-08 DIAGNOSIS — I498 Other specified cardiac arrhythmias: Secondary | ICD-10-CM

## 2013-08-08 MED ORDER — HYDRALAZINE HCL 50 MG PO TABS: 50.0000 mg | ORAL_TABLET | Freq: Three times a day (TID) | ORAL | Status: DC

## 2013-08-08 MED ORDER — DILTIAZEM HCL ER COATED BEADS 120 MG PO CP24: 120.0000 mg | ORAL_CAPSULE | Freq: Once | ORAL | Status: DC

## 2013-08-08 MED ORDER — FUROSEMIDE 40 MG PO TABS: 40.0000 mg | ORAL_TABLET | Freq: Two times a day (BID) | ORAL | Status: DC

## 2013-08-08 NOTE — Progress Notes (Signed)
[place]. [address], [address], [place]  [address] Phone: [phone] Fax:  [phone]  Date:  08/08/2013   ID:  Brittany Charles, DOB Apr 08, 1951, MRN [id number]  PCP:  Angelica Chessman, MD   History of Present Illness: Brittany Charles is a 63 y.o. female here for the evaluation of palpitations with recent emergency room visit on 06/12/13. While in emergency department, patient's rhythm was noted to be atrial flutter with variable block?. I have reviewed strips/EKGs in the system and I do not see an atrial flutter EKG. Her original EKG however demonstrated what looks like supraventricular tachycardia at 160 beats per minute. Intravenous Cardizem was administered and he converted back to sinus rhythm. She was discharged home. Has a history of medical noncompliance, hypertension, chronic kidney disease stage IV. A prescription for hydralazine was administered. Amlodipine was discontinued and Cardizem CD 120 mg was administered.  Heart was pounding, granddaughter took pulse. Too fast. SVT  Heart catheterization 04/21/2011 - mild to moderate LAD stenosis with no significant RCA or left circumflex stenosis. She was previously seen by Dr. Stanford Breed.   Wt Readings from Last 3 Encounters:  08/08/13 196 lb (88.905 kg)  06/13/13 210 lb (95.255 kg)  10/17/12 199 lb (90.266 kg)     Past Medical History  Diagnosis Date  . CHF (congestive heart failure)   . Lupus (systemic lupus erythematosus)   . Hypertension   . Fibroids   . Renal insufficiency     Past Surgical History  Procedure Laterality Date  . Tonsillectomy    . Abdominal hysterectomy  10/27/2000    TAH, BSO    Current Outpatient Prescriptions  Medication Sig Dispense Refill  . diltiazem (CARDIZEM CD) 120 MG 24 hr capsule Take 1 capsule (120 mg total) by mouth once.  30 capsule  0  . furosemide (LASIX) 40 MG tablet Take 1 tablet (40 mg total) by mouth 2 (two) times daily.  60 tablet  0  . hydrALAZINE  (APRESOLINE) 50 MG tablet Take 1 tablet (50 mg total) by mouth 3 (three) times daily.  90 tablet  0   No current facility-administered medications for this visit.    Allergies:    Allergies  Allergen Reactions  . Zithromax [Azithromycin] Nausea And Vomiting    Social History:  The patient  reports that she has never smoked. She has never used smokeless tobacco. She reports that she does not drink alcohol or use illicit drugs.   Family History  Problem Relation Age of Onset  . Hypertension Mother   . Hyperlipidemia Father   . Colon cancer    . Breast cancer    . Heart disease Mother     CHF    ROS:  Please see the history of present illness.   No syncope, no bleeding, no chest pain, no shortness of breath. Positive arthritic pain.   All other systems reviewed and negative.   PHYSICAL EXAM: VS:  BP 190/92  Pulse 73  Ht 5\' 5"  (1.651 m)  Wt 196 lb (88.905 kg)  BMI 32.62 kg/m2 Well nourished, well developed, in no acute distress HEENT: normal, [place]/AT, EOMI Neck: no JVD, normal carotid upstroke, no bruit Cardiac:  normal S1, S2; RRR; no murmur Lungs:  clear to auscultation bilaterally, no wheezing, rhonchi or rales Abd: soft, nontender, no hepatomegaly, no bruits Ext: no edema, 2+ distal pulses Skin: warm and dry GU: deferred Neuro: no focal abnormalities noted, AAO x 3  EKG:  08/08/13-sinus rhythm rate 73 with no specific ST-T wave changes. LVH. Emergency department EKG demonstrates SVT 160. No atrial flutter EKG.    ASSESSMENT AND PLAN:  1. Paroxysmal supraventricular tachycardia -no longer. I agree with continuing diltiazem. Female, hypertensive. If atrial flutter were to return, anticoagulation would be in order given her CHADS-VASc 2. She just has minor LV systolic dysfunction at [id number]. Also she was instructed to take an extra diltiazem if tachycardia were to occur. Given her minimal reduction in ejection fraction, I do believe that diltiazem will be safe for  her. 2. Hypertension-continue to work on this with your primary physician. Continue current medications. Needs further improvement. 3. Mild systolic LV dysfunction-may be optimal in the future to transition to beta blocker. For now, continue with diltiazem as it helped significantly reduce her SVT. 4. Six-month followup  Signed, Candee Furbish, MD [facility]  08/08/2013 10:36 AM

## 2013-08-08 NOTE — Patient Instructions (Signed)
Your physician recommends that you continue on your current medications as directed. Please refer to the Current Medication list given to you today.  Your physician wants you to follow-up in: 6 months with Dr. Skains. You will receive a reminder letter in the mail two months in advance. If you don't receive a letter, please call our office to schedule the follow-up appointment.  

## 2014-03-06 ENCOUNTER — Other Ambulatory Visit: Payer: Self-pay | Admitting: Cardiology

## 2014-03-13 ENCOUNTER — Other Ambulatory Visit: Payer: Self-pay | Admitting: Cardiology

## 2014-04-09 ENCOUNTER — Other Ambulatory Visit: Payer: Self-pay | Admitting: Cardiology

## 2014-04-15 ENCOUNTER — Other Ambulatory Visit: Payer: Self-pay | Admitting: Cardiology

## 2014-04-18 ENCOUNTER — Encounter (HOSPITAL_COMMUNITY): Payer: Self-pay | Admitting: Cardiovascular Disease

## 2014-05-15 ENCOUNTER — Other Ambulatory Visit: Payer: Self-pay | Admitting: Cardiology

## 2014-06-14 ENCOUNTER — Other Ambulatory Visit: Payer: Self-pay | Admitting: Cardiology

## 2014-09-12 ENCOUNTER — Ambulatory Visit (INDEPENDENT_AMBULATORY_CARE_PROVIDER_SITE_OTHER): Payer: Commercial Managed Care - HMO | Admitting: Family Medicine

## 2014-09-12 ENCOUNTER — Ambulatory Visit (INDEPENDENT_AMBULATORY_CARE_PROVIDER_SITE_OTHER): Payer: Commercial Managed Care - HMO

## 2014-09-12 VITALS — BP 190/86 | HR 71 | Temp 98.0°F | Resp 16 | Ht 65.0 in | Wt 207.0 lb

## 2014-09-12 DIAGNOSIS — I10 Essential (primary) hypertension: Secondary | ICD-10-CM | POA: Diagnosis not present

## 2014-09-12 DIAGNOSIS — N390 Urinary tract infection, site not specified: Secondary | ICD-10-CM | POA: Diagnosis not present

## 2014-09-12 DIAGNOSIS — M6289 Other specified disorders of muscle: Secondary | ICD-10-CM | POA: Diagnosis not present

## 2014-09-12 DIAGNOSIS — Z862 Personal history of diseases of the blood and blood-forming organs and certain disorders involving the immune mechanism: Secondary | ICD-10-CM

## 2014-09-12 DIAGNOSIS — M329 Systemic lupus erythematosus, unspecified: Secondary | ICD-10-CM

## 2014-09-12 DIAGNOSIS — R8299 Other abnormal findings in urine: Secondary | ICD-10-CM | POA: Diagnosis not present

## 2014-09-12 DIAGNOSIS — I509 Heart failure, unspecified: Secondary | ICD-10-CM | POA: Diagnosis not present

## 2014-09-12 DIAGNOSIS — IMO0002 Reserved for concepts with insufficient information to code with codable children: Secondary | ICD-10-CM

## 2014-09-12 DIAGNOSIS — M25642 Stiffness of left hand, not elsewhere classified: Secondary | ICD-10-CM | POA: Diagnosis not present

## 2014-09-12 DIAGNOSIS — Z9114 Patient's other noncompliance with medication regimen: Secondary | ICD-10-CM | POA: Diagnosis not present

## 2014-09-12 DIAGNOSIS — N184 Chronic kidney disease, stage 4 (severe): Secondary | ICD-10-CM | POA: Diagnosis not present

## 2014-09-12 DIAGNOSIS — R8271 Bacteriuria: Secondary | ICD-10-CM

## 2014-09-12 DIAGNOSIS — I130 Hypertensive heart and chronic kidney disease with heart failure and stage 1 through stage 4 chronic kidney disease, or unspecified chronic kidney disease: Secondary | ICD-10-CM | POA: Diagnosis not present

## 2014-09-12 DIAGNOSIS — R29898 Other symptoms and signs involving the musculoskeletal system: Secondary | ICD-10-CM

## 2014-09-12 LAB — POCT CBC
Granulocyte percent: 70.7 %G (ref 37–80)
HCT, POC: 39.4 % (ref 37.7–47.9)
HEMOGLOBIN: 12.4 g/dL (ref 12.2–16.2)
Lymph, poc: 2.2 (ref 0.6–3.4)
MCH, POC: 25.6 pg — AB (ref 27–31.2)
MCHC: 31.4 g/dL — AB (ref 31.8–35.4)
MCV: 81.4 fL (ref 80–97)
MID (cbc): 0.8 (ref 0–0.9)
MPV: 8.4 fL (ref 0–99.8)
POC GRANULOCYTE: 7.2 — AB (ref 2–6.9)
POC LYMPH PERCENT: 21.9 %L (ref 10–50)
POC MID %: 7.4 %M (ref 0–12)
Platelet Count, POC: 350 10*3/uL (ref 142–424)
RBC: 4.83 M/uL (ref 4.04–5.48)
RDW, POC: 17.3 %
WBC: 10.2 10*3/uL (ref 4.6–10.2)

## 2014-09-12 LAB — POCT URINALYSIS DIPSTICK
Bilirubin, UA: NEGATIVE
Glucose, UA: NEGATIVE
Ketones, UA: NEGATIVE
NITRITE UA: NEGATIVE
PH UA: 6
Protein, UA: >300
RBC UA: NEGATIVE
Spec Grav, UA: 1.025
UROBILINOGEN UA: 0.2

## 2014-09-12 LAB — POCT GLYCOSYLATED HEMOGLOBIN (HGB A1C): Hemoglobin A1C: 5.5

## 2014-09-12 LAB — POCT UA - MICROSCOPIC ONLY
Casts, Ur, LPF, POC: NEGATIVE
Crystals, Ur, HPF, POC: NEGATIVE
MUCUS UA: NEGATIVE
YEAST UA: NEGATIVE

## 2014-09-12 LAB — POCT SEDIMENTATION RATE: POCT SED RATE: 84 mm/hr — AB (ref 0–22)

## 2014-09-12 MED ORDER — TRAMADOL HCL 50 MG PO TABS: 50.0000 mg | ORAL_TABLET | Freq: Three times a day (TID) | ORAL | Status: DC | PRN

## 2014-09-12 NOTE — Progress Notes (Signed)
09/12/2014 at 4:39 PM  Brittany Charles / DOB: Sep 05, 1950 / MRN: [id number]  The patient has Cough; Hypertension; Gout; Lupus (systemic lupus erythematosus); Shortness of breath; CKD (chronic kidney disease), stage III; RUQ pain; Systolic CHF; and Proteinuria on her problem list.  SUBJECTIVE  Chief complaint: Hand Pain  Patient here for hand pain that started roughly 3 weeks ago which she describes as stiffness and weakness.  She has a history of lupus and reports that her left wrist and ankle have "fused" and she is worried that this may happen to her left hand. She reports that she does not have a rheumatologist and says that she has never been referred to one.  She has a history of poorly controlled hypertension and CKD.  She has a history of CAD and systolic CHF and has seen Dr. Candee Furbish once for this in March of 2015 and was advised to f/u in six months. She has no primary provider and says that the Dr. Doreene Burke, who is listed in Epic as such, was selected randomly by ED provider staff.   Collateral: Spoke with patient's pharamcy. They report the last time she picked up her furosemide was in January, which she should be taking twice a day pills at that time. They also report that the last time she picked up [place] was in October of 2015 and received thirty at that time. They reports she has been getting her Hydralazine filled at standard intervals.   She  has a past medical history of CHF (congestive heart failure); Lupus (systemic lupus erythematosus); Hypertension; Fibroids; Renal insufficiency; Arthritis; and Myocardial infarction.    Medications reviewed and updated by myself where necessary, and exist elsewhere in the encounter.   Ms. Rees is allergic to zithromax. She  reports that she has never smoked. She has never used smokeless tobacco. She reports that she does not drink alcohol or use illicit drugs. She  reports that she does not engage in sexual activity. The patient  has  past surgical history that includes Tonsillectomy; Abdominal hysterectomy (10/27/2000); and left and right heart catheterization with coronary angiogram (Right, 04/21/2011).  Her family history includes Breast cancer in an other family member; Colon cancer in an other family member; Heart disease in her mother; Hyperlipidemia in her father; Hypertension in her father and mother; Stroke in her father, maternal grandmother, and mother.  Review of Systems  Constitutional: Negative for fever.  HENT: Negative for nosebleeds.   Eyes: Negative for blurred vision, double vision and pain.  Cardiovascular: Negative for chest pain.  Gastrointestinal: Negative for nausea.  Genitourinary: Negative for dysuria, urgency, frequency and flank pain.  Neurological: Negative for dizziness, tingling, sensory change, speech change, seizures, loss of consciousness, weakness and headaches.    OBJECTIVE  Her  height is 5\' 5"  (1.651 m) and weight is 207 lb (93.895 kg). Her oral temperature is 98 F (36.7 C). Her blood pressure is 190/86 and her pulse is 71. Her respiration is 16 and oxygen saturation is 98%.  The patient's body mass index is 34.45 kg/(m^2).  Physical Exam  Vitals reviewed. Constitutional: She is oriented to person, place, and time. No distress.  Cardiovascular: Normal rate and regular rhythm.   Respiratory: Effort normal and breath sounds normal.  Musculoskeletal:       Arms: Neurological: She is alert and oriented to person, place, and time. No cranial nerve deficit. GCS eye subscore is 4. GCS verbal subscore is 5. GCS motor subscore is 6.  4/5  grip on left versus right. Negative phalen's and tinnels at the carpal tunnel.  The remainder of upper extremity strength is 5/5.  Skin: Skin is warm and dry. She is not diaphoretic.  Psychiatric: She has a normal mood and affect.      Results for orders placed or performed in visit on 09/12/14 (from the past 24 hour(s))  POCT CBC     Status: Abnormal     Collection Time: 09/12/14  4:02 PM  Result Value Ref Range   WBC 10.2 4.6 - 10.2 K/uL   Lymph, poc 2.2 0.6 - 3.4   POC LYMPH PERCENT 21.9 10 - 50 %L   MID (cbc) 0.8 0 - 0.9   POC MID % 7.4 0 - 12 %M   POC Granulocyte 7.2 (A) 2 - 6.9   Granulocyte percent 70.7 37 - 80 %G   RBC 4.83 4.04 - 5.48 M/uL   Hemoglobin 12.4 12.2 - 16.2 g/dL   HCT, POC 39.4 37.7 - 47.9 %   MCV 81.4 80 - 97 fL   MCH, POC 25.6 (A) 27 - 31.2 pg   MCHC 31.4 (A) 31.8 - 35.4 g/dL   RDW, POC 17.3 %   Platelet Count, POC 350 142 - 424 K/uL   MPV 8.4 0 - 99.8 fL  POCT glycosylated hemoglobin (Hb A1C)     Status: None   Collection Time: 09/12/14  4:02 PM  Result Value Ref Range   Hemoglobin A1C 5.5   POCT urinalysis dipstick     Status: None   Collection Time: 09/12/14  4:12 PM  Result Value Ref Range   Color, UA yellow    Clarity, UA hazy    Glucose, UA neg    Bilirubin, UA neg    Ketones, UA neg    Spec Grav, UA 1.025    Blood, UA neg    pH, UA 6.0    Protein, UA >300    Urobilinogen, UA 0.2    Nitrite, UA neg    Leukocytes, UA small (1+)   POCT UA - Microscopic Only     Status: None   Collection Time: 09/12/14  4:17 PM  Result Value Ref Range   WBC, Ur, HPF, POC 5-8    RBC, urine, microscopic 3-7    Bacteria, U Microscopic 2+    Mucus, UA neg    Epithelial cells, urine per micros 5-10    Crystals, Ur, HPF, POC neg    Casts, Ur, LPF, POC neg    Yeast, UA neg    [facility] reading (PRIMARY) by  Dr. Carlota Raspberry: Degernerative changes on the PIPs and carpal bones, but no fracture.  DRUJ and Radiocarpal auto fusion present.    ASSESSMENT & PLAN  Tekila was seen today for hand pain.  Diagnoses and all orders for this visit:  Stiffness of left hand joint: Likely secondary to chronic inflammation secondary to lupus. She has confirmed fusion of multiple wrist articulations. Will treat for pain and will manage primary etiology with 2nd problem.   Orders: -     DG Hand Complete Left; Future  History of  lupus Orders: -     Ambulatory referral to Rheumatology -     POCT SEDIMENTATION RATE  Essential hypertension: Patient with three medication regimen initiated by cardiology.  Per pharmacy she is non compliant with filling these medications, however her pharmacy informed me that she did call them today asking to pick up this medications. I have advised to her to  take these medications as prescribed.  Will recheck her BP in five days and make necessary adjustments to her regimen assuming she will be compliant at that time.  Orders: -     POCT CBC -     POCT urinalysis dipstick -     POCT UA - Microscopic Only -     Advised that patient start checking her BP daily and present with a BP diary at future follow up appointments   Kidney Failure stage IV: Likely being driven by HTN or Lupus, or a combination of these etiologies.   Orders: -     POCT glycosylated hemoglobin (Hb A1C) -     Comprehensive metabolic panel -     Amb nephrology  Weakness of left hand: Most likely 2/2 to untreated SLE, however pt is high risk for stroke and will rule out intracranial process with non contrast head CT    Non compliance w medication regimen: Per pharmacy patient has been filling her medication erratically and she has not followed up with Dr. Arlyce Dice per his recommendation of 6 months follow up. Will attempt to keep close follow up with this patient in an attempt to improve her health and non compliance.  She is to be seen in five days for a recheck.   Asymptomatic bacteruira: Incidentally found while assessing kidney function.  Will culture.  Patient asymptomatic in this regard.    The patient was advised to call or come back to clinic if she does not see an improvement in symptoms, or worsens with the above plan.   Philis Fendt, MHS, PA-C [facility] 09/12/2014 4:39 PM

## 2014-09-13 ENCOUNTER — Other Ambulatory Visit (INDEPENDENT_AMBULATORY_CARE_PROVIDER_SITE_OTHER): Payer: Commercial Managed Care - HMO

## 2014-09-13 ENCOUNTER — Other Ambulatory Visit: Payer: Self-pay | Admitting: Physician Assistant

## 2014-09-13 ENCOUNTER — Telehealth: Payer: Self-pay

## 2014-09-13 DIAGNOSIS — E875 Hyperkalemia: Secondary | ICD-10-CM

## 2014-09-13 LAB — COMPLETE METABOLIC PANEL WITH GFR
ALBUMIN: 4.2 g/dL (ref 3.5–5.2)
ALK PHOS: 146 U/L — AB (ref 39–117)
ALT: 8 U/L (ref 0–35)
AST: 24 U/L (ref 0–37)
AST: 13 U/L (ref 0–37)
Albumin: 3.8 g/dL (ref 3.5–5.2)
Alkaline Phosphatase: 155 U/L — ABNORMAL HIGH (ref 39–117)
BILIRUBIN TOTAL: 0.3 mg/dL (ref 0.2–1.2)
BUN: 27 mg/dL — ABNORMAL HIGH (ref 6–23)
BUN: 28 mg/dL — ABNORMAL HIGH (ref 6–23)
CO2: 24 mEq/L (ref 19–32)
CO2: 22 meq/L (ref 19–32)
Calcium: 9.2 mg/dL (ref 8.4–10.5)
Calcium: 9 mg/dL (ref 8.4–10.5)
Chloride: 104 mEq/L (ref 96–112)
Chloride: 108 mEq/L (ref 96–112)
Creat: 2.06 mg/dL — ABNORMAL HIGH (ref 0.50–1.10)
Creat: 2.41 mg/dL — ABNORMAL HIGH (ref 0.50–1.10)
GFR, EST AFRICAN AMERICAN: 24 mL/min — AB
GFR, EST NON AFRICAN AMERICAN: 25 mL/min — AB
GFR, Est African American: 29 mL/min — ABNORMAL LOW
GFR, Est Non African American: 21 mL/min — ABNORMAL LOW
Glucose, Bld: 86 mg/dL (ref 70–99)
Glucose, Bld: 89 mg/dL (ref 70–99)
POTASSIUM: 7.3 meq/L — AB (ref 3.5–5.3)
Potassium: 4.7 mEq/L (ref 3.5–5.3)
SODIUM: 140 meq/L (ref 135–145)
Sodium: 137 mEq/L (ref 135–145)
TOTAL PROTEIN: 8.6 g/dL — AB (ref 6.0–8.3)
TOTAL PROTEIN: 8 g/dL (ref 6.0–8.3)
Total Bilirubin: 0.3 mg/dL (ref 0.2–1.2)

## 2014-09-13 LAB — URINE CULTURE: Colony Count: 75000

## 2014-09-13 NOTE — Telephone Encounter (Signed)
[facility] called and stated the pt's potassium is at 7.3/ I spoke with Brittany Charles he advised me to call pt to let her know and to come back in for a blood draw. Pt seemed reluctant stating she will try to get over here. I advised her how important this is. Pt understood.

## 2014-09-13 NOTE — Progress Notes (Signed)
Patient with stage 4 kidney disease with Hyperkalemia found on CMET yesterday.  [facility] report sample was hemolyzed.  Redrawing stat.  Patient asymptomatic today. Philis Fendt, MS, PA-C   11:05 AM, 09/13/2014

## 2014-09-14 NOTE — Progress Notes (Signed)
Xray read and patient discussed with Mr. Brittany Charles. Agree with assessment and plan of care per his note.

## 2014-09-17 ENCOUNTER — Ambulatory Visit (INDEPENDENT_AMBULATORY_CARE_PROVIDER_SITE_OTHER): Payer: Commercial Managed Care - HMO | Admitting: Physician Assistant

## 2014-09-17 ENCOUNTER — Encounter: Payer: Self-pay | Admitting: Physician Assistant

## 2014-09-17 VITALS — BP 168/74 | HR 74 | Ht 65.0 in | Wt 205.0 lb

## 2014-09-17 VITALS — BP 168/66 | HR 69 | Temp 98.3°F | Resp 16 | Ht 65.0 in | Wt 203.1 lb

## 2014-09-17 DIAGNOSIS — R5382 Chronic fatigue, unspecified: Secondary | ICD-10-CM | POA: Diagnosis not present

## 2014-09-17 DIAGNOSIS — I5022 Chronic systolic (congestive) heart failure: Secondary | ICD-10-CM | POA: Diagnosis not present

## 2014-09-17 DIAGNOSIS — M25571 Pain in right ankle and joints of right foot: Secondary | ICD-10-CM

## 2014-09-17 DIAGNOSIS — I10 Essential (primary) hypertension: Secondary | ICD-10-CM

## 2014-09-17 DIAGNOSIS — R5383 Other fatigue: Secondary | ICD-10-CM | POA: Insufficient documentation

## 2014-09-17 MED ORDER — LISINOPRIL 5 MG PO TABS: 5.0000 mg | ORAL_TABLET | Freq: Every day | ORAL | Status: DC

## 2014-09-17 MED ORDER — DILTIAZEM HCL ER COATED BEADS 120 MG PO CP24: 120.0000 mg | ORAL_CAPSULE | Freq: Every day | ORAL | Status: DC

## 2014-09-17 MED ORDER — TRAMADOL-ACETAMINOPHEN 37.5-325 MG PO TABS: 1.0000 | ORAL_TABLET | Freq: Three times a day (TID) | ORAL | Status: DC | PRN

## 2014-09-17 NOTE — Patient Instructions (Signed)
Medication Instructions:   Your physician recommends that you continue on your current medications as directed. Please refer to the Current Medication list given to you today.  Labwork:   Testing/Procedures:   Follow-Up:Your physician wants you to follow-up in:  One year with dr Marlou Porch  You will receive a reminder letter in the mail two months in advance. If you don't receive a letter, please call our office to schedule the follow-up appointment.    Any Other Special Instructions Will Be Listed Below (If Applicable).  Calorie Counting for Weight Loss Calories are energy you get from the things you eat and drink. Your body uses this energy to keep you going throughout the day. The number of calories you eat affects your weight. When you eat more calories than your body needs, your body stores the extra calories as fat. When you eat fewer calories than your body needs, your body burns fat to get the energy it needs. Calorie counting means keeping track of how many calories you eat and drink each day. If you make sure to eat fewer calories than your body needs, you should lose weight. In order for calorie counting to work, you will need to eat the number of calories that are right for you in a day to lose a healthy amount of weight per week. A healthy amount of weight to lose per week is usually 1-2 lb (0.5-0.9 kg). A dietitian can determine how many calories you need in a day and give you suggestions on how to reach your calorie goal.  WHAT IS MY MY PLAN? My goal is to have __________ calories per day.  If I have this many calories per day, I should lose around __________ pounds per week. WHAT DO I NEED TO KNOW ABOUT CALORIE COUNTING? In order to meet your daily calorie goal, you will need to:  Find out how many calories are in each food you would like to eat. Try to do this before you eat.  Decide how much of the food you can eat.  Write down what you ate and how many calories it had.  Doing this is called keeping a food log. WHERE DO I FIND CALORIE INFORMATION? The number of calories in a food can be found on a Nutrition Facts label. Note that all the information on a label is based on a specific serving of the food. If a food does not have a Nutrition Facts label, try to look up the calories online or ask your dietitian for help. HOW DO I DECIDE HOW MUCH TO EAT? To decide how much of the food you can eat, you will need to consider both the number of calories in one serving and the size of one serving. This information can be found on the Nutrition Facts label. If a food does not have a Nutrition Facts label, look up the information online or ask your dietitian for help. Remember that calories are listed per serving. If you choose to have more than one serving of a food, you will have to multiply the calories per serving by the amount of servings you plan to eat. For example, the label on a package of bread might say that a serving size is 1 slice and that there are 90 calories in a serving. If you eat 1 slice, you will have eaten 90 calories. If you eat 2 slices, you will have eaten 180 calories. HOW DO I KEEP A FOOD LOG? After each meal, record the following  information in your food log:  What you ate.  How much of it you ate.  How many calories it had.  Then, add up your calories. Keep your food log near you, such as in a small notebook in your pocket. Another option is to use a mobile app or website. Some programs will calculate calories for you and show you how many calories you have left each time you add an item to the log. WHAT ARE SOME CALORIE COUNTING TIPS?  Use your calories on foods and drinks that will fill you up and not leave you hungry. Some examples of this include foods like nuts and nut butters, vegetables, lean proteins, and high-fiber foods (more than 5 g fiber per serving).  Eat nutritious foods and avoid empty calories. Empty calories are calories you  get from foods or beverages that do not have many nutrients, such as candy and soda. It is better to have a nutritious high-calorie food (such as an avocado) than a food with few nutrients (such as a bag of chips).  Know how many calories are in the foods you eat most often. This way, you do not have to look up how many calories they have each time you eat them.  Look out for foods that may seem like low-calorie foods but are really high-calorie foods, such as baked goods, soda, and fat-free candy.  Pay attention to calories in drinks. Drinks such as sodas, specialty coffee drinks, alcohol, and juices have a lot of calories yet do not fill you up. Choose low-calorie drinks like water and diet drinks.  Focus your calorie counting efforts on higher calorie items. Logging the calories in a garden salad that contains only vegetables is less important than calculating the calories in a milk shake.  Find a way of tracking calories that works for you. Get creative. Most people who are successful find ways to keep track of how much they eat in a day, even if they do not count every calorie. WHAT ARE SOME PORTION CONTROL TIPS?  Know how many calories are in a serving. This will help you know how many servings of a certain food you can have.  Use a measuring cup to measure serving sizes. This is helpful when you start out. With time, you will be able to estimate serving sizes for some foods.  Take some time to put servings of different foods on your favorite plates, bowls, and cups so you know what a serving looks like.  Try not to eat straight from a bag or box. Doing this can lead to overeating. Put the amount you would like to eat in a cup or on a plate to make sure you are eating the right portion.  Use smaller plates, glasses, and bowls to prevent overeating. This is a quick and easy way to practice portion control. If your plate is smaller, less food can fit on it.  Try not to multitask while  eating, such as watching TV or using your computer. If it is time to eat, sit down at a table and enjoy your food. Doing this will help you to start recognizing when you are full. It will also make you more aware of what and how much you are eating. HOW CAN I CALORIE COUNT WHEN EATING OUT?  Ask for smaller portion sizes or child-sized portions.  Consider sharing an entree and sides instead of getting your own entree.  If you get your own entree, eat only half. Ask  for a box at the beginning of your meal and put the rest of your entree in it so you are not tempted to eat it.  Look for the calories on the menu. If calories are listed, choose the lower calorie options.  Choose dishes that include vegetables, fruits, whole grains, low-fat dairy products, and lean protein. Focusing on smart food choices from each of the 5 food groups can help you stay on track at restaurants.  Choose items that are boiled, broiled, grilled, or steamed.  Choose water, milk, unsweetened iced tea, or other drinks without added sugars. If you want an alcoholic beverage, choose a lower calorie option. For example, a regular margarita can have up to 700 calories and a glass of wine has around 150.  Stay away from items that are buttered, battered, fried, or served with cream sauce. Items labeled "crispy" are usually fried, unless stated otherwise.  Ask for dressings, sauces, and syrups on the side. These are usually very high in calories, so do not eat much of them.  Watch out for salads. Many people think salads are a healthy option, but this is often not the case. Many salads come with bacon, fried chicken, lots of cheese, fried chips, and dressing. All of these items have a lot of calories. If you want a salad, choose a garden salad and ask for grilled meats or steak. Ask for the dressing on the side, or ask for olive oil and vinegar or lemon to use as dressing.  Estimate how many servings of a food you are given. For  example, a serving of cooked rice is  cup or about the size of half a tennis ball or one cupcake wrapper. Knowing serving sizes will help you be aware of how much food you are eating at restaurants. The list below tells you how big or small some common portion sizes are based on everyday objects.  1 oz--4 stacked dice.  3 oz--1 deck of cards.  1 tsp--1 dice.  1 Tbsp-- a Ping-Pong ball.  2 Tbsp--1 Ping-Pong ball.   cup--1 tennis ball or 1 cupcake wrapper.  1 cup--1 baseball. Document Released: 04/27/2005 Document Revised: 09/11/2013 Document Reviewed: 03/02/2013 [facility] Patient Information 2015 [place], [place]. This information is not intended to replace advice given to you by your health care provider. Make sure you discuss any questions you have with your health care provider.   Low-Sodium Eating Plan Sodium raises blood pressure and causes water to be held in the body. Getting less sodium from food will help lower your blood pressure, reduce any swelling, and protect your heart, liver, and kidneys. We get sodium by adding salt (sodium chloride) to food. Most of our sodium comes from canned, boxed, and frozen foods. Restaurant foods, fast foods, and pizza are also very high in sodium. Even if you take medicine to lower your blood pressure or to reduce fluid in your body, getting less sodium from your food is important. WHAT IS MY PLAN? Most people should limit their sodium intake to 2,300 mg a day. Your health care provider recommends that you limit your sodium intake to __________ a day.  WHAT DO I NEED TO KNOW ABOUT THIS EATING PLAN? For the low-sodium eating plan, you will follow these general guidelines:  Choose foods with a % Daily Value for sodium of less than 5% (as listed on the food label).   Use salt-free seasonings or herbs instead of table salt or sea salt.   Check with your  health care provider or pharmacist before using salt substitutes.   Eat fresh foods.  Eat  more vegetables and fruits.  Limit canned vegetables. If you do use them, rinse them well to decrease the sodium.   Limit cheese to 1 oz (28 g) per day.   Eat lower-sodium products, often labeled as "lower sodium" or "no salt added."  Avoid foods that contain monosodium glutamate (MSG). MSG is sometimes added to [place] food and some canned foods.  Check food labels (Nutrition Facts labels) on foods to learn how much sodium is in one serving.  Eat more home-cooked food and less restaurant, buffet, and fast food.  When eating at a restaurant, ask that your food be prepared with less salt or none, if possible.  HOW DO I READ FOOD LABELS FOR SODIUM INFORMATION? The Nutrition Facts label lists the amount of sodium in one serving of the food. If you eat more than one serving, you must multiply the listed amount of sodium by the number of servings. Food labels may also identify foods as:  Sodium free--Less than 5 mg in a serving.  Very low sodium--35 mg or less in a serving.  Low sodium--140 mg or less in a serving.  Light in sodium--50% less sodium in a serving. For example, if a food that usually has 300 mg of sodium is changed to become light in sodium, it will have 150 mg of sodium.  Reduced sodium--25% less sodium in a serving. For example, if a food that usually has 400 mg of sodium is changed to reduced sodium, it will have 300 mg of sodium. WHAT FOODS CAN I EAT? Grains Low-sodium cereals, including oats, puffed wheat and rice, and shredded wheat cereals. Low-sodium crackers. Unsalted rice and pasta. Lower-sodium bread.  Vegetables Frozen or fresh vegetables. Low-sodium or reduced-sodium canned vegetables. Low-sodium or reduced-sodium tomato sauce and paste. Low-sodium or reduced-sodium tomato and vegetable juices.  Fruits Fresh, frozen, and canned fruit. Fruit juice.  Meat and Other Protein Products Low-sodium canned tuna and salmon. Fresh or frozen meat, poultry,  seafood, and fish. Lamb. Unsalted nuts. Dried beans, peas, and lentils without added salt. Unsalted canned beans. Homemade soups without salt. Eggs.  Dairy Milk. Soy milk. Ricotta cheese. Low-sodium or reduced-sodium cheeses. Yogurt.  Condiments Fresh and dried herbs and spices. Salt-free seasonings. Onion and garlic powders. Low-sodium varieties of mustard and ketchup. Lemon juice.  Fats and Oils Reduced-sodium salad dressings. Unsalted butter.  Other Unsalted popcorn and pretzels.  The items listed above may not be a complete list of recommended foods or beverages. Contact your dietitian for more options. WHAT FOODS ARE NOT RECOMMENDED? Grains Instant hot cereals. Bread stuffing, pancake, and biscuit mixes. Croutons. Seasoned rice or pasta mixes. Noodle soup cups. Boxed or frozen macaroni and cheese. Self-rising flour. Regular salted crackers. Vegetables Regular canned vegetables. Regular canned tomato sauce and paste. Regular tomato and vegetable juices. Frozen vegetables in sauces. Salted french fries. Olives. Angie Fava. Relishes. Sauerkraut. Salsa. Meat and Other Protein Products Salted, canned, smoked, spiced, or pickled meats, seafood, or fish. Bacon, ham, sausage, hot dogs, corned beef, chipped beef, and packaged luncheon meats. Salt pork. Jerky. Pickled herring. Anchovies, regular canned tuna, and sardines. Salted nuts. Dairy Processed cheese and cheese spreads. Cheese curds. Blue cheese and cottage cheese. Buttermilk.  Condiments Onion and garlic salt, seasoned salt, table salt, and sea salt. Canned and packaged gravies. Worcestershire sauce. Tartar sauce. Barbecue sauce. Teriyaki sauce. Soy sauce, including reduced sodium. Steak sauce. Fish sauce. Oyster sauce.  Cocktail sauce. Horseradish. Regular ketchup and mustard. Meat flavorings and tenderizers. Bouillon cubes. Hot sauce. Tabasco sauce. Marinades. Taco seasonings. Relishes. Fats and Oils Regular salad dressings.  Salted butter. Margarine. Ghee. Bacon fat.  Other Potato and tortilla chips. Corn chips and puffs. Salted popcorn and pretzels. Canned or dried soups. Pizza. Frozen entrees and pot pies.  The items listed above may not be a complete list of foods and beverages to avoid. Contact your dietitian for more information. Document Released: 10/17/2001 Document Revised: 05/02/2013 Document Reviewed: 03/01/2013 [facility] Patient Information 2015 [place], [place]. This information is not intended to replace advice given to you by your health care provider. Make sure you discuss any questions you have with your health care provider.

## 2014-09-17 NOTE — Assessment & Plan Note (Signed)
Patient's main  Complaint today is fatigue. Her last TSH was checked in 2014 and was normal. It could be due to deconditioning and weight gain. I asked her to discuss this with primary care at her visit today.

## 2014-09-17 NOTE — Assessment & Plan Note (Signed)
Blood pressure is elevated today. Patient is following a low-sodium diet. She has gained 9 pounds since last year. I discussed a weight loss program such as Weight Watchers with her. She has a difficult time exercising because of her ankle fusion and walking with a cane. She has no access to pull. She is being seen by primary care today to adjust her antihypertensive so I will make no changes today. We'll renew her diltiazem.

## 2014-09-17 NOTE — Assessment & Plan Note (Signed)
No trouble with heart failure today or the past year.

## 2014-09-17 NOTE — Progress Notes (Signed)
09/17/2014 at 8:26 PM  [name] / DOB: 09-07-1950 / MRN: [id number]  The patient has Cough; Hypertension; Gout; Lupus (systemic lupus erythematosus); Shortness of breath; CKD (chronic kidney disease), stage III; RUQ pain; Systolic CHF; Proteinuria; and Fatigue on her problem list.  SUBJECTIVE  Chief complaint: Hypertension  Patient here for follow up for blood pressure, initially seen on 09/12/14. She has a long history of medication non compliance and was not taking her blood pressure medication as prescribed at that visit.  Today she reports she has filled all of her medications and has been taking them as prescribed.   She  has a past medical history of CHF (congestive heart failure); Lupus (systemic lupus erythematosus); Hypertension; Fibroids; Renal insufficiency; Arthritis; and Myocardial infarction.    Medications reviewed and updated by myself where necessary, and exist elsewhere in the encounter.   Ms. Viele is allergic to zithromax. She  reports that she has never smoked. She has never used smokeless tobacco. She reports that she does not drink alcohol or use illicit drugs. She  reports that she does not engage in sexual activity. The patient  has past surgical history that includes Tonsillectomy; Abdominal hysterectomy (10/27/2000); and left and right heart catheterization with coronary angiogram (Right, 04/21/2011).  Her family history includes Breast cancer in an other family member; Colon cancer in an other family member; Heart disease in her mother; Hyperlipidemia in her father; Hypertension in her father and mother; Stroke in her father, maternal grandmother, and mother.  Review of Systems  Constitutional: Negative for fever.  Eyes: Negative.   Respiratory: Negative for cough.   Cardiovascular: Negative for chest pain.  Gastrointestinal: Negative for nausea.  Genitourinary: Negative.   Musculoskeletal: Positive for joint pain.  Skin: Negative for rash.    Neurological: Negative for dizziness and headaches.    OBJECTIVE  Her  height is 5\' 5"  (1.651 m) and weight is 203 lb 2 oz (92.137 kg). Her oral temperature is 98.3 F (36.8 C). Her blood pressure is 168/66 and her pulse is 69. Her respiration is 16 and oxygen saturation is 99%.  The patient's body mass index is 33.8 kg/(m^2).  BP Readings from Last 3 Encounters:  09/17/14 168/66  09/17/14 168/74  09/12/14 190/86    Physical Exam  Constitutional: She is oriented to person, place, and time. She appears well-developed and well-nourished.  Cardiovascular: Normal rate and regular rhythm.   Respiratory: Effort normal and breath sounds normal.  GI: Soft. Bowel sounds are normal.  Musculoskeletal: Normal range of motion. She exhibits tenderness (over the achilles tendon).  Neurological: She is alert and oriented to person, place, and time. No cranial nerve deficit. Coordination normal.  Skin: Skin is warm and dry.  Psychiatric: She has a normal mood and affect.    No results found for this or any previous visit (from the past 24 hour(s)).  ASSESSMENT & PLAN  Maygen was seen today for hypertension.  Diagnoses and all orders for this visit:  Essential hypertension: Patient's CrCl checked via labs roughly five days ago and renal dose of Lisinopril would have her start at 5 mg.  Will initiate therapy today and ask that she follow up in 2 weeks for labs and BP medication titration. Potassium is within normal limits. Orders: -     lisinopril (PRINIVIL,ZESTRIL) 5 MG tablet; Take 1 tablet (5 mg total) by mouth daily.  Ankle Pain right:  Sweedo for compression. Ultracet 37.5-325.  Patient kidney's will not tolerate NSAID therapy.  The patient was advised to call or come back to clinic if she does not see an improvement in symptoms, or worsens with the above plan.   Philis Fendt, MHS, PA-C [facility] 09/17/2014 8:26 PM

## 2014-09-17 NOTE — Progress Notes (Signed)
Cardiology Office Note   Date:  09/17/2014   ID:  Brittany Charles, DOB 1950-10-05, MRN [id number]  PCP:  Kathlen Brunswick, PA-C  Cardiologist:  Dr.Skains  Chief Complaint:  fatigue    History of Present Illness: Brittany Charles is a 64 y.o. female who presents for follow-up. She has a history of hypertension, CAD with cath in 2012 showing mild to moderate LAD stenosis with no significant RCA or circumflex stenosis. She also has history of medical noncompliance, chronic kidney disease stage IV. She was recently in the emergency room in 06/2013 with rapid heart rate with question of atrial flutter with variable block. Dr. Luther Parody reviewed her strips and EKGs and felt like it was SVT. She converted to sinus rhythm with IV Cardizem. She was given a prescription for Cardizem CD 120 mg.Chadsvasc was to an of atrial flutter were to return anticoagulation would be in order. She has minor LV dysfunction EF 45-50%.  She comes in today for her yearly follow-up. She denies any chest pain, palpitations, dyspnea, dyspnea on exertion, dizziness or presyncope. She's had no further problems with SVT since she's been taken diltiazem. Her main complaint is fatigue. Some days she just doesn't feel like doing anything. She walks with a cane because of an ankle fusion. She is seeing her primary care today for adjustment in her blood pressure medications. She recently had blood work and creatinine was up to 2.41.    Past Medical History  Diagnosis Date  . CHF (congestive heart failure)   . Lupus (systemic lupus erythematosus)   . Hypertension   . Fibroids   . Renal insufficiency   . Arthritis   . Myocardial infarction     Past Surgical History  Procedure Laterality Date  . Tonsillectomy    . Abdominal hysterectomy  10/27/2000    TAH, BSO  . Left and right heart catheterization with coronary angiogram Right 04/21/2011    Procedure: LEFT AND RIGHT HEART CATHETERIZATION WITH CORONARY ANGIOGRAM;  Surgeon:  [name], MD;  Location: [facility] CATH LAB;  Service: Cardiovascular;  Laterality: Right;     Current Outpatient Prescriptions  Medication Sig Dispense Refill  . diltiazem (CARTIA XT) 120 MG 24 hr capsule Take 1 capsule (120 mg total) by mouth daily. 30 capsule 6  . furosemide (LASIX) 40 MG tablet TAKE 1 TABLET BY MOUTH TWICE DAILY 60 tablet 6  . hydrALAZINE (APRESOLINE) 50 MG tablet TAKE 1 TABLET BY MOUTH THREE TIMES DAILY 90 tablet 6  . traMADol (ULTRAM) 50 MG tablet Take 1 tablet (50 mg total) by mouth every 8 (eight) hours as needed. 24 tablet 0   No current facility-administered medications for this visit.    Allergies:   Zithromax    Social History:  The patient  reports that she has never smoked. She has never used smokeless tobacco. She reports that she does not drink alcohol or use illicit drugs.   Family History:  The patient's   family history includes Breast cancer in an other family member; Colon cancer in an other family member; Heart disease in her mother; Hyperlipidemia in her father; Hypertension in her father and mother; Stroke in her father, maternal grandmother, and mother.    ROS:  Please see the history of present illness.   Otherwise, review of systems are positive for  Excessive fatigue, joint swelling and balance problems , chronic ankle pain.   All other systems are reviewed and negative.    PHYSICAL EXAM: VS:  BP 168/74 mmHg  Pulse 74  Ht 5\' 5"  (1.651 m)  Wt 205 lb (92.987 kg)  BMI 34.11 kg/m2 , BMI Body mass index is 34.11 kg/(m^2). GEN:  obese, well developed, in no acute distress Neck: no JVD, HJR, carotid bruits, or masses Cardiac:RRR;  Positive S4, 1 to 2/6 systolic murmur at the left sternal border, no rubs, thrill or heave,  Respiratory:  clear to auscultation bilaterally, normal work of breathing GI: soft, nontender, nondistended, + BS MS: no deformity or atrophy Extremities: without cyanosis, clubbing, edema, good distal pulses bilaterally.   Skin: warm and dry, no rash Neuro:  Strength and sensation are intact    EKG:  EKG is ordered today. The ekg ordered today demonstrates  Normal sinus rhythm with nonspecific ST-T wave changes, no acute change from EKG last March 2015.   Recent Labs: 09/12/2014: Hemoglobin 12.4 09/13/2014: ALT <8; BUN 28*; Creatinine 2.41*; Potassium 4.7; Sodium 140    Lipid Panel    Component Value Date/Time   CHOL 136 04/17/2011 0700   TRIG 113 04/17/2011 0700   HDL 22* 04/17/2011 0700   CHOLHDL 6.2 04/17/2011 0700   VLDL 23 04/17/2011 0700   LDLCALC 91 04/17/2011 0700      Wt Readings from Last 3 Encounters:  09/17/14 205 lb (92.987 kg)  09/12/14 207 lb (93.895 kg)  08/08/13 196 lb (88.905 kg)      Other studies Reviewed: Additional studies/ records that were reviewed today include and review of the records demonstrates:   2-D echo 10/02/12 Study Conclusions  - Left ventricle: Diffuse hypokinesis worse in the inferior   base The cavity size was mildly dilated. Wall thickness   was increased in a pattern of mild LVH. Systolic function   was mildly reduced. The estimated ejection fraction was in   the range of 45% to 50%. - Left atrium: The atrium was mildly dilated. - Right atrium: The atrium was mildly dilated. - Tricuspid valve: Mild-moderate regurgitation. - Pulmonary arteries: PA peak pressure: 16mm Hg (S). Transthoracic echocardiography.   ASSESSMENT AND PLAN:  Hypertension  Blood pressure is elevated today. Patient is following a low-sodium diet. She has gained 9 pounds since last year. I discussed a weight loss program such as Weight Watchers with her. She has a difficult time exercising because of her ankle fusion and walking with a cane. She has no access to pull. She is being seen by primary care today to adjust her antihypertensive so I will make no changes today. We'll renew her diltiazem.   Systolic CHF  No trouble with heart failure today or the past  year.   Fatigue  Patient's main  Complaint today is fatigue. Her last TSH was checked in 2014 and was normal. It could be due to deconditioning and weight gain. I asked her to discuss this with primary care at her visit today.     Signed, Ermalinda Barrios, PA-C  09/17/2014 1:51 PM    [facility] [address], [place], [place]  [address] Phone: [phone]; Fax: [phone]

## 2014-09-19 ENCOUNTER — Ambulatory Visit
Admission: RE | Admit: 2014-09-19 | Discharge: 2014-09-19 | Disposition: A | Discharge status: Home / Self Care | Payer: Commercial Managed Care - HMO | Source: Ambulatory Visit | Attending: Physician Assistant | Admitting: Physician Assistant

## 2014-09-19 DIAGNOSIS — R531 Weakness: Secondary | ICD-10-CM | POA: Diagnosis not present

## 2014-09-19 DIAGNOSIS — H538 Other visual disturbances: Secondary | ICD-10-CM | POA: Diagnosis not present

## 2014-09-19 DIAGNOSIS — R29898 Other symptoms and signs involving the musculoskeletal system: Secondary | ICD-10-CM

## 2014-09-19 DIAGNOSIS — R2689 Other abnormalities of gait and mobility: Secondary | ICD-10-CM | POA: Diagnosis not present

## 2014-11-14 ENCOUNTER — Other Ambulatory Visit: Payer: Self-pay | Admitting: Physician Assistant

## 2014-11-17 ENCOUNTER — Ambulatory Visit (INDEPENDENT_AMBULATORY_CARE_PROVIDER_SITE_OTHER): Payer: Commercial Managed Care - HMO

## 2014-11-17 ENCOUNTER — Ambulatory Visit (INDEPENDENT_AMBULATORY_CARE_PROVIDER_SITE_OTHER): Payer: Commercial Managed Care - HMO | Admitting: Emergency Medicine

## 2014-11-17 VITALS — BP 152/64 | HR 80 | Temp 98.2°F | Resp 16 | Ht 65.0 in | Wt 201.4 lb

## 2014-11-17 DIAGNOSIS — M329 Systemic lupus erythematosus, unspecified: Secondary | ICD-10-CM

## 2014-11-17 DIAGNOSIS — IMO0002 Reserved for concepts with insufficient information to code with codable children: Secondary | ICD-10-CM

## 2014-11-17 DIAGNOSIS — M25642 Stiffness of left hand, not elsewhere classified: Secondary | ICD-10-CM

## 2014-11-17 DIAGNOSIS — M25532 Pain in left wrist: Secondary | ICD-10-CM

## 2014-11-17 LAB — CBC WITH DIFFERENTIAL/PLATELET
BASOS PCT: 0 % (ref 0–1)
Basophils Absolute: 0 10*3/uL (ref 0.0–0.1)
Eosinophils Absolute: 0 10*3/uL (ref 0.0–0.7)
Eosinophils Relative: 0 % (ref 0–5)
HEMATOCRIT: 37.3 % (ref 36.0–46.0)
HEMOGLOBIN: 11.8 g/dL — AB (ref 12.0–15.0)
LYMPHS PCT: 10 % — AB (ref 12–46)
Lymphs Abs: 1.1 10*3/uL (ref 0.7–4.0)
MCH: 25.6 pg — ABNORMAL LOW (ref 26.0–34.0)
MCHC: 31.6 g/dL (ref 30.0–36.0)
MCV: 80.9 fL (ref 78.0–100.0)
MONOS PCT: 5 % (ref 3–12)
MPV: 10.2 fL (ref 8.6–12.4)
Monocytes Absolute: 0.6 10*3/uL (ref 0.1–1.0)
NEUTROS ABS: 9.7 10*3/uL — AB (ref 1.7–7.7)
NEUTROS PCT: 85 % — AB (ref 43–77)
Platelets: 320 10*3/uL (ref 150–400)
RBC: 4.61 MIL/uL (ref 3.87–5.11)
RDW: 15 % (ref 11.5–15.5)
WBC: 11.4 10*3/uL — ABNORMAL HIGH (ref 4.0–10.5)

## 2014-11-17 LAB — URIC ACID: Uric Acid, Serum: 11.6 mg/dL — ABNORMAL HIGH (ref 2.4–7.0)

## 2014-11-17 MED ORDER — PREDNISONE 20 MG PO TABS: ORAL_TABLET | ORAL | Status: DC

## 2014-11-17 MED ORDER — TRAMADOL HCL 50 MG PO TABS: 50.0000 mg | ORAL_TABLET | Freq: Four times a day (QID) | ORAL | Status: DC | PRN

## 2014-11-17 NOTE — Progress Notes (Addendum)
This chart was scribed for Arlyss Queen, MD by Marti Sleigh, Medical Scribe. This patient was seen in Room 1 and the patient's care was started at 2:12 PM.  Chief Complaint:  Chief Complaint  Patient presents with  . Wrist Pain    Left wrist, x 3 days, pt states she has Lupus. Pt would like handicap tag    HPI: Brittany Charles is a 64 y.o. female with a past hx of lupus, gout, kidney failure, and CHF who reports to [facility] today complaining of severe pain in her left wrist and hand for the last three days. Pt states her PCP is Dr. Carlota Raspberry and Dr Carlota Raspberry has referred her to Dr Posey Rea  for evaluation and she has an appointment in a month. Pt states when she has lupus flareups she gets pain in her joints, primarily in her ankles and wrists. Pt's cardiologist is Dr. Marlou Porch, and she had an appointment in may.    Past Medical History  Diagnosis Date  . CHF (congestive heart failure)   . Lupus (systemic lupus erythematosus)   . Hypertension   . Fibroids   . Renal insufficiency   . Arthritis   . Myocardial infarction    Past Surgical History  Procedure Laterality Date  . Tonsillectomy    . Abdominal hysterectomy  10/27/2000    TAH, BSO  . Left and right heart catheterization with coronary angiogram Right 04/21/2011    Procedure: LEFT AND RIGHT HEART CATHETERIZATION WITH CORONARY ANGIOGRAM;  Surgeon: [name], MD;  Location: [facility] CATH LAB;  Service: Cardiovascular;  Laterality: Right;   History   Social History  . Marital Status: Single    Spouse Name: N/A  . Number of Children: N/A  . Years of Education: N/A   Social History Main Topics  . Smoking status: Never Smoker   . Smokeless tobacco: Never Used  . Alcohol Use: No  . Drug Use: No  . Sexual Activity: No   Other Topics Concern  . None   Social History Narrative   Family History  Problem Relation Age of Onset  . Hypertension Mother   . Heart disease Mother     CHF  . Stroke Mother   . Hyperlipidemia Father     . Stroke Father   . Hypertension Father   . Colon cancer    . Breast cancer    . Stroke Maternal Grandmother    Allergies  Allergen Reactions  . Zithromax [Azithromycin] Nausea And Vomiting   Prior to Admission medications   Medication Sig Start Date End Date Taking? Authorizing Provider  diltiazem (CARTIA XT) 120 MG 24 hr capsule Take 1 capsule (120 mg total) by mouth daily. 09/17/14  Yes Imogene Burn, PA-C  furosemide (LASIX) 40 MG tablet TAKE 1 TABLET BY MOUTH TWICE DAILY 06/15/14  Yes Jerline Pain, MD  hydrALAZINE (APRESOLINE) 50 MG tablet TAKE 1 TABLET BY MOUTH THREE TIMES DAILY 06/15/14  Yes Jerline Pain, MD  lisinopril (PRINIVIL,ZESTRIL) 5 MG tablet TAKE 1 TABLET (5 MG ) BY MOUTH DAILY.  "OV NEEDED FOR REFILLS" 11/14/14  Yes Chelle Jeffery, PA-C  traMADol (ULTRAM) 50 MG tablet Take 1 tablet (50 mg total) by mouth every 8 (eight) hours as needed. 09/12/14  Yes Tereasa Coop, PA-C  traMADol-acetaminophen (ULTRACET) 37.5-325 MG per tablet Take 1 tablet by mouth every 8 (eight) hours as needed for severe pain (Do not take with tramadol.). Patient not taking: Reported on 11/17/2014 09/17/14  Tereasa Coop, PA-C     ROS: The patient denies fevers, chills, night sweats, unintentional weight loss, chest pain, palpitations, wheezing, dyspnea on exertion, nausea, vomiting, abdominal pain, dysuria, hematuria.  All other systems have been reviewed and were otherwise negative with the exception of those mentioned in the HPI and as above.    PHYSICAL EXAM: Filed Vitals:   11/17/14 1329  BP: 152/64  Pulse: 80  Temp: 98.2 F (36.8 C)  Resp: 16   Body mass index is 33.51 kg/(m^2).   General: Alert, no acute distress HEENT:  Normocephalic, atraumatic, oropharynx patent. Eye: [name] Cardiovascular:  Regular rate and rhythm, no rubs murmurs or gallops.  No Carotid bruits, radial pulse intact. No pedal edema.  Respiratory: Clear to auscultation bilaterally.  No wheezes, rales, or  rhonchi.  No cyanosis, no use of accessory musculature Abdominal: No organomegaly, abdomen is soft and non-tender, positive bowel sounds.  No masses. Musculoskeletal: significant swelling of the left wrist, pain with flexion or extension, NVI in fingers.  Skin: No rashes. Neurologic: Facial musculature symmetric. Psychiatric: Patient acts appropriately throughout our interaction. Lymphatic: No cervical or submandibular lymphadenopathy  LABS: Results for orders placed or performed in visit on 09/13/14  COMPLETE METABOLIC PANEL WITH GFR  Result Value Ref Range   Sodium 140 135 - 145 mEq/L   Potassium 4.7 3.5 - 5.3 mEq/L   Chloride 108 96 - 112 mEq/L   CO2 22 19 - 32 mEq/L   Glucose, Bld 89 70 - 99 mg/dL   BUN 28 (H) 6 - 23 mg/dL   Creat 2.41 (H) 0.50 - 1.10 mg/dL   Total Bilirubin 0.3 0.2 - 1.2 mg/dL   Alkaline Phosphatase 155 (H) 39 - 117 U/L   AST 13 0 - 37 U/L   ALT <8 0 - 35 U/L   Total Protein 8.0 6.0 - 8.3 g/dL   Albumin 3.8 3.5 - 5.2 g/dL   Calcium 9.0 8.4 - 10.5 mg/dL   GFR, Est African American 24 (L) mL/min   GFR, Est Non African American 21 (L) mL/min     EKG/XRAY:   Primary read interpreted by Dr. [facility]lene Farrier at [facility]. There appears to be destruction of the radiocarpal joint. There is lateral subluxation of the distal ulna.   ASSESSMENT/PLAN: I am not sure if this is a flare of her connective tissue disease or whether this is gout. She has a history of lupus. I did do a sedimentation rate as well as a double-stranded DNA since patient is on hydralazine. She has a appointment to see the rheumatologist. I think the safest medication would be prednisone in a taper along with pain medication. Patient placed on a taper dose prednisone Ultram refilled splint applied. I was worried that if indeed she does have lupus could the hydralazine be contributing to her recent problems. I will forward this to Dr. Erline Levine sideeffects, risk and benefits, and alternatives of medications  d/w patient. Patient is aware that all medications have potential sideeffects and we are unable to predict every sideeffect or drug-drug interaction that may occur.  Arlyss Queen MD 11/17/2014 2:12 PM

## 2014-11-18 LAB — SEDIMENTATION RATE: SED RATE: 74 mm/h — AB (ref 0–30)

## 2014-11-19 LAB — ANTI-NUCLEAR AB-TITER (ANA TITER): ANA Titer 1: 1:160 {titer} — ABNORMAL HIGH

## 2014-11-19 LAB — ANTI-DNA ANTIBODY, DOUBLE-STRANDED: ds DNA Ab: 3 IU/mL

## 2014-11-19 LAB — ANA: Anti Nuclear Antibody(ANA): POSITIVE — AB

## 2014-12-01 ENCOUNTER — Other Ambulatory Visit: Payer: Self-pay | Admitting: Emergency Medicine

## 2014-12-01 MED ORDER — PREDNISONE 20 MG PO TABS: ORAL_TABLET | ORAL | Status: DC

## 2014-12-01 MED ORDER — PREDNISONE 10 MG PO TABS: ORAL_TABLET | ORAL | Status: DC

## 2014-12-01 NOTE — Telephone Encounter (Signed)
[facility] called for prednisone refill. Attempted to call patient for history on how she is doing. No answer. Dr Everlene Farrier sent refill but states that it is not long term use and she needs to be seen.

## 2015-01-09 ENCOUNTER — Other Ambulatory Visit: Payer: Self-pay | Admitting: Cardiology

## 2015-02-12 ENCOUNTER — Other Ambulatory Visit: Payer: Self-pay | Admitting: Family Medicine

## 2015-02-12 ENCOUNTER — Ambulatory Visit (INDEPENDENT_AMBULATORY_CARE_PROVIDER_SITE_OTHER): Payer: Commercial Managed Care - HMO | Admitting: Family Medicine

## 2015-02-12 VITALS — BP 138/82 | HR 71 | Temp 98.5°F | Resp 18 | Ht 67.0 in | Wt 203.6 lb

## 2015-02-12 DIAGNOSIS — N183 Chronic kidney disease, stage 3 unspecified: Secondary | ICD-10-CM

## 2015-02-12 DIAGNOSIS — I10 Essential (primary) hypertension: Secondary | ICD-10-CM

## 2015-02-12 DIAGNOSIS — M329 Systemic lupus erythematosus, unspecified: Secondary | ICD-10-CM

## 2015-02-12 MED ORDER — LISINOPRIL 5 MG PO TABS: ORAL_TABLET | ORAL | Status: DC

## 2015-02-12 MED ORDER — PREDNISONE 10 MG PO TABS: ORAL_TABLET | ORAL | Status: DC

## 2015-02-12 NOTE — Progress Notes (Signed)
° °  Subjective:    Patient ID: Brittany Charles, female    DOB: Oct 15, 1950, 64 y.o.   MRN: [id number] This chart was scribed for Robyn Haber, MD by Zola Button, Medical Scribe. This patient was seen in Room 13 and the patient's care was started at 9:01 AM.   HPI HPI Comments: Brittany Charles is a 64 y.o. female with a history of lupus, gout, hypertension, and CKD stage III who presents to the [facility] for a medication refill for lisinopril and prednisone. Patient also requests a handicap placard. She takes prednisone for flare-ups of pain which have been occurring about once a month. She currently has pain in her left ankle. Patient has never had a lupus rash. She does have stiffness in her fingers. She has seen a rheumatologist and nephrologist in the past.    Review of Systems  Constitutional: Positive for fatigue.  Musculoskeletal: Positive for arthralgias.       Objective:   Physical Exam CONSTITUTIONAL: Well developed/well nourished HEAD: Normocephalic/atraumatic EYES: EOM/PERRL ENMT: Mucous membranes moist NECK: supple no meningeal signs SPINE: entire spine nontender CV: S1/S2 noted, no murmurs/rubs/gallops noted LUNGS: Lungs are clear to auscultation bilaterally, no apparent distress ABDOMEN: soft, nontender, no rebound or guarding GU: no cva tenderness NEURO: Pt is awake/alert, moves all extremitiesx4 EXTREMITIES: pulses normal; cannot make a full fist with the left hand secondary to finger stiffness in the ulnar 3 fingers, patient using a cane SKIN: warm, color normal PSYCH: no abnormalities of mood noted        Assessment & Plan:   By signing my name below, I, Zola Button, attest that this documentation has been prepared under the direction and in the presence of Robyn Haber, MD.  Electronically Signed: Zola Button, Medical Scribe. 02/12/2015. 9:01 AM. This chart was scribed in my presence and reviewed by me personally.   ICD-9-CM ICD-10-CM   1. Systemic lupus (HCC) 710.0 M32.9 predniSONE (DELTASONE) 10 MG tablet  2. Essential hypertension 401.9 I10   3. CKD (chronic kidney disease) stage 3, GFR 30-59 ml/min 585.3 N18.3 lisinopril (PRINIVIL,ZESTRIL) 5 MG tablet     Signed, Robyn Haber, MD

## 2015-03-15 DIAGNOSIS — M329 Systemic lupus erythematosus, unspecified: Secondary | ICD-10-CM | POA: Diagnosis not present

## 2015-03-15 DIAGNOSIS — I10 Essential (primary) hypertension: Secondary | ICD-10-CM | POA: Diagnosis not present

## 2015-03-15 DIAGNOSIS — N390 Urinary tract infection, site not specified: Secondary | ICD-10-CM | POA: Diagnosis not present

## 2015-03-15 DIAGNOSIS — N184 Chronic kidney disease, stage 4 (severe): Secondary | ICD-10-CM | POA: Diagnosis not present

## 2015-03-18 DIAGNOSIS — M329 Systemic lupus erythematosus, unspecified: Secondary | ICD-10-CM | POA: Diagnosis not present

## 2015-03-27 ENCOUNTER — Other Ambulatory Visit: Payer: Self-pay | Admitting: Family Medicine

## 2015-03-28 ENCOUNTER — Ambulatory Visit (INDEPENDENT_AMBULATORY_CARE_PROVIDER_SITE_OTHER): Payer: Commercial Managed Care - HMO | Admitting: Family Medicine

## 2015-03-28 ENCOUNTER — Other Ambulatory Visit: Payer: Self-pay | Admitting: Family Medicine

## 2015-03-28 VITALS — BP 162/70 | HR 84 | Temp 98.4°F | Resp 17 | Wt 199.0 lb

## 2015-03-28 DIAGNOSIS — M329 Systemic lupus erythematosus, unspecified: Secondary | ICD-10-CM

## 2015-03-28 DIAGNOSIS — I10 Essential (primary) hypertension: Secondary | ICD-10-CM | POA: Diagnosis not present

## 2015-03-28 DIAGNOSIS — N183 Chronic kidney disease, stage 3 unspecified: Secondary | ICD-10-CM

## 2015-03-28 DIAGNOSIS — M10331 Gout due to renal impairment, right wrist: Secondary | ICD-10-CM

## 2015-03-28 LAB — URIC ACID: URIC ACID, SERUM: 11.4 mg/dL — AB (ref 2.4–7.0)

## 2015-03-28 LAB — SEDIMENTATION RATE: SED RATE: 91 mm/h — AB (ref 0–30)

## 2015-03-28 MED ORDER — HYDRALAZINE HCL 50 MG PO TABS: 50.0000 mg | ORAL_TABLET | Freq: Three times a day (TID) | ORAL | Status: DC

## 2015-03-28 MED ORDER — PREDNISONE 20 MG PO TABS: ORAL_TABLET | ORAL | Status: DC

## 2015-03-28 MED ORDER — LISINOPRIL 5 MG PO TABS: ORAL_TABLET | ORAL | Status: DC

## 2015-03-28 MED ORDER — HYDROCODONE-ACETAMINOPHEN 5-325 MG PO TABS: ORAL_TABLET | ORAL | Status: DC

## 2015-03-28 MED ORDER — DILTIAZEM HCL ER COATED BEADS 120 MG PO CP24: 120.0000 mg | ORAL_CAPSULE | Freq: Every day | ORAL | Status: DC

## 2015-03-28 MED ORDER — FUROSEMIDE 40 MG PO TABS: 40.0000 mg | ORAL_TABLET | Freq: Two times a day (BID) | ORAL | Status: DC

## 2015-03-28 MED ORDER — ALLOPURINOL 100 MG PO TABS: 100.0000 mg | ORAL_TABLET | Freq: Every day | ORAL | Status: DC

## 2015-03-28 NOTE — Patient Instructions (Addendum)
Continue current medications  When you see your kidney specialists, discuss with him whether you can decrease your Lasix at all, because it may be making gout attacks worse  When you see a kidney specialist, ask him if you can continue to take the allopurinol  Take prednisone 3 pills daily for 2 days, then 2 daily for 2 days, then 1 daily for 2 days. Does take an early in the day  Use an ice bag on your wrist today. The pain should start subsiding within 12-24 hours after taking the prednisone  Take the hydrocodone for severe pain only  Look online for an additional list of foods that are known to make gout worse  Wait until your wrist has been much better for 1 week, then begin taking allopurinol 100 mg 1 daily to try to lower the uric acid.  Return in about 2-3 months to get your uric acid level rechecked. See Dr. Nyoka Cowden. Ask also if he feels like he needs to see a rheumatologist for your lupus.  Gout Gout is an inflammatory arthritis caused by a buildup of uric acid crystals in the joints. Uric acid is a chemical that is normally present in the blood. When the level of uric acid in the blood is too high it can form crystals that deposit in your joints and tissues. This causes joint redness, soreness, and swelling (inflammation). Repeat attacks are common. Over time, uric acid crystals can form into masses (tophi) near a joint, destroying bone and causing disfigurement. Gout is treatable and often preventable. CAUSES  The disease begins with elevated levels of uric acid in the blood. Uric acid is produced by your body when it breaks down a naturally found substance called purines. Certain foods you eat, such as meats and fish, contain high amounts of purines. Causes of an elevated uric acid level include:  Being passed down from parent to child (heredity).  Diseases that cause increased uric acid production (such as obesity, psoriasis, and certain cancers).  Excessive alcohol  use.  Diet, especially diets rich in meat and seafood.  Medicines, including certain cancer-fighting medicines (chemotherapy), water pills (diuretics), and aspirin.  Chronic kidney disease. The kidneys are no longer able to remove uric acid well.  Problems with metabolism. Conditions strongly associated with gout include:  Obesity.  High blood pressure.  High cholesterol.  Diabetes. Not everyone with elevated uric acid levels gets gout. It is not understood why some people get gout and others do not. Surgery, joint injury, and eating too much of certain foods are some of the factors that can lead to gout attacks. SYMPTOMS   An attack of gout comes on quickly. It causes intense pain with redness, swelling, and warmth in a joint.  Fever can occur.  Often, only one joint is involved. Certain joints are more commonly involved:  Base of the big toe.  Knee.  Ankle.  Wrist.  Finger. Without treatment, an attack usually goes away in a few days to weeks. Between attacks, you usually will not have symptoms, which is different from many other forms of arthritis. DIAGNOSIS  Your caregiver will suspect gout based on your symptoms and exam. In some cases, tests may be recommended. The tests may include:  Blood tests.  Urine tests.  X-rays.  Joint fluid exam. This exam requires a needle to remove fluid from the joint (arthrocentesis). Using a microscope, gout is confirmed when uric acid crystals are seen in the joint fluid. TREATMENT  There are two  phases to gout treatment: treating the sudden onset (acute) attack and preventing attacks (prophylaxis).  Treatment of an Acute Attack.  Medicines are used. These include anti-inflammatory medicines or steroid medicines.  An injection of steroid medicine into the affected joint is sometimes necessary.  The painful joint is rested. Movement can worsen the arthritis.  You may use warm or cold treatments on painful joints, depending  which works best for you.  Treatment to Prevent Attacks.  If you suffer from frequent gout attacks, your caregiver may advise preventive medicine. These medicines are started after the acute attack subsides. These medicines either help your kidneys eliminate uric acid from your body or decrease your uric acid production. You may need to stay on these medicines for a very long time.  The early phase of treatment with preventive medicine can be associated with an increase in acute gout attacks. For this reason, during the first few months of treatment, your caregiver may also advise you to take medicines usually used for acute gout treatment. Be sure you understand your caregiver's directions. Your caregiver may make several adjustments to your medicine dose before these medicines are effective.  Discuss dietary treatment with your caregiver or dietitian. Alcohol and drinks high in sugar and fructose and foods such as meat, poultry, and seafood can increase uric acid levels. Your caregiver or dietitian can advise you on drinks and foods that should be limited. HOME CARE INSTRUCTIONS   Do not take aspirin to relieve pain. This raises uric acid levels.  Only take over-the-counter or prescription medicines for pain, discomfort, or fever as directed by your caregiver.  Rest the joint as much as possible. When in bed, keep sheets and blankets off painful areas.  Keep the affected joint raised (elevated).  Apply warm or cold treatments to painful joints. Use of warm or cold treatments depends on which works best for you.  Use crutches if the painful joint is in your leg.  Drink enough fluids to keep your urine clear or pale yellow. This helps your body get rid of uric acid. Limit alcohol, sugary drinks, and fructose drinks.  Follow your dietary instructions. Pay careful attention to the amount of protein you eat. Your daily diet should emphasize fruits, vegetables, whole grains, and fat-free or  low-fat milk products. Discuss the use of coffee, vitamin C, and cherries with your caregiver or dietitian. These may be helpful in lowering uric acid levels.  Maintain a healthy body weight. SEEK MEDICAL CARE IF:   You develop diarrhea, vomiting, or any side effects from medicines.  You do not feel better in 24 hours, or you are getting worse. SEEK IMMEDIATE MEDICAL CARE IF:   Your joint becomes suddenly more tender, and you have chills or a fever. MAKE SURE YOU:   Understand these instructions.  Will watch your condition.  Will get help right away if you are not doing well or get worse.   This information is not intended to replace advice given to you by your health care provider. Make sure you discuss any questions you have with your health care provider.   Document Released: 04/24/2000 Document Revised: 05/18/2014 Document Reviewed: 12/09/2011 Elsevier Interactive Patient Education [employer].

## 2015-03-28 NOTE — Progress Notes (Signed)
Patient ID: Brittany Charles, female    DOB: 06-Nov-1950  Age: 64 y.o. MRN: [id number]  Chief Complaint  Patient presents with  . Temporomandibular Joint Pain    all over  . Medication Refill    prednisone, lisinopril, lasix    Subjective:   64 year old lady who is here arriving in pain holding her right wrist and screaming. She says she has gout, lupus. No complaints of temporomandibular joint pain even though it is listed above. She has a history of some recurrent flares of Gout, this is the first time in the right wrist. She has not ever been on allopurinol. She does have chronic kidney disease and previously documented hyperuricemia greater than 11. She needs refills of her blood pressure medications. She says Dr. Carlota Raspberry is her main doctor, does not have an appointment to see him.  Current allergies, medications, problem list, past/family and social histories reviewed.  Objective:  BP 162/70 mmHg  Pulse 84  Temp(Src) 98.4 F (36.9 C) (Oral)  Resp 17  Wt 199 lb (90.266 kg)  SpO2 98% significant acute distress. Holding right wrist. Very tender in the radial aspect of the right wrist. Mildly warm to touch.  Assessment & Plan:   Assessment: 1. Acute gout due to renal impairment involving right wrist   2. Systemic lupus ([place])   3. CKD (chronic kidney disease) stage 3, GFR 30-59 ml/min   4. Essential hypertension       Plan: Tried to calm her down. Reviewed everything with her. Explained that she really does need to have her uric acid level lower. There is some potential concern of allopurinol and chronic kidney disease, but felt like she could tolerate it and needed it. She is to discuss it further with her kidney specialist. I explained several times that she is not to get it until the acute gout is calm down. Cannot treat with NSAIDs or colchicine so I'm   Orders Placed This Encounter  Procedures  . Uric acid  . Sedimentation rate    Meds ordered this encounter    Medications  . predniSONE (DELTASONE) 20 MG tablet    Sig: Take 3 a day for 3 days 2 a day for 3 days one a day for 3 days    Dispense:  12 tablet    Refill:  0  . furosemide (LASIX) 40 MG tablet    Sig: Take 1 tablet (40 mg total) by mouth 2 (two) times daily.    Dispense:  60 tablet    Refill:  6  . lisinopril (PRINIVIL,ZESTRIL) 5 MG tablet    Sig: TAKE 1 TABLET (5 MG ) BY MOUTH DAILY.  "OV NEEDED FOR REFILLS"    Dispense:  15 tablet    Refill:  0  . hydrALAZINE (APRESOLINE) 50 MG tablet    Sig: Take 1 tablet (50 mg total) by mouth 3 (three) times daily.    Dispense:  270 tablet    Refill:  1  . diltiazem (CARTIA XT) 120 MG 24 hr capsule    Sig: Take 1 capsule (120 mg total) by mouth daily.    Dispense:  30 capsule    Refill:  6  . allopurinol (ZYLOPRIM) 100 MG tablet    Sig: Take 1 tablet (100 mg total) by mouth daily.    Dispense:  30 tablet    Refill:  3  . HYDROcodone-acetaminophen (NORCO) 5-325 MG tablet    Sig: Take 1 every 4 hours as needed for severe pain  only.    Dispense:  15 tablet    Refill:  0         Patient Instructions  Continue current medications  When you see your kidney specialists, discuss with him whether you can decrease your Lasix at all, because it may be making gout attacks worse  When you see a kidney specialist, ask him if you can continue to take the allopurinol  Take prednisone 3 pills daily for 2 days, then 2 daily for 2 days, then 1 daily for 2 days. Does take an early in the day  Use an ice bag on your wrist today. The pain should start subsiding within 12-24 hours after taking the prednisone  Take the hydrocodone for severe pain only  Look online for an additional list of foods that are known to make gout worse  Wait until your wrist has been much better for 1 week, then begin taking allopurinol 100 mg 1 daily to try to lower the uric acid.  Return in about 2-3 months to get your uric acid level rechecked. See Dr. Nyoka Cowden. Ask  also if he feels like he needs to see a rheumatologist for your lupus.  Gout Gout is an inflammatory arthritis caused by a buildup of uric acid crystals in the joints. Uric acid is a chemical that is normally present in the blood. When the level of uric acid in the blood is too high it can form crystals that deposit in your joints and tissues. This causes joint redness, soreness, and swelling (inflammation). Repeat attacks are common. Over time, uric acid crystals can form into masses (tophi) near a joint, destroying bone and causing disfigurement. Gout is treatable and often preventable. CAUSES  The disease begins with elevated levels of uric acid in the blood. Uric acid is produced by your body when it breaks down a naturally found substance called purines. Certain foods you eat, such as meats and fish, contain high amounts of purines. Causes of an elevated uric acid level include:  Being passed down from parent to child (heredity).  Diseases that cause increased uric acid production (such as obesity, psoriasis, and certain cancers).  Excessive alcohol use.  Diet, especially diets rich in meat and seafood.  Medicines, including certain cancer-fighting medicines (chemotherapy), water pills (diuretics), and aspirin.  Chronic kidney disease. The kidneys are no longer able to remove uric acid well.  Problems with metabolism. Conditions strongly associated with gout include:  Obesity.  High blood pressure.  High cholesterol.  Diabetes. Not everyone with elevated uric acid levels gets gout. It is not understood why some people get gout and others do not. Surgery, joint injury, and eating too much of certain foods are some of the factors that can lead to gout attacks. SYMPTOMS   An attack of gout comes on quickly. It causes intense pain with redness, swelling, and warmth in a joint.  Fever can occur.  Often, only one joint is involved. Certain joints are more commonly involved:  Base  of the big toe.  Knee.  Ankle.  Wrist.  Finger. Without treatment, an attack usually goes away in a few days to weeks. Between attacks, you usually will not have symptoms, which is different from many other forms of arthritis. DIAGNOSIS  Your caregiver will suspect gout based on your symptoms and exam. In some cases, tests may be recommended. The tests may include:  Blood tests.  Urine tests.  X-rays.  Joint fluid exam. This exam requires a needle to remove  fluid from the joint (arthrocentesis). Using a microscope, gout is confirmed when uric acid crystals are seen in the joint fluid. TREATMENT  There are two phases to gout treatment: treating the sudden onset (acute) attack and preventing attacks (prophylaxis).  Treatment of an Acute Attack.  Medicines are used. These include anti-inflammatory medicines or steroid medicines.  An injection of steroid medicine into the affected joint is sometimes necessary.  The painful joint is rested. Movement can worsen the arthritis.  You may use warm or cold treatments on painful joints, depending which works best for you.  Treatment to Prevent Attacks.  If you suffer from frequent gout attacks, your caregiver may advise preventive medicine. These medicines are started after the acute attack subsides. These medicines either help your kidneys eliminate uric acid from your body or decrease your uric acid production. You may need to stay on these medicines for a very long time.  The early phase of treatment with preventive medicine can be associated with an increase in acute gout attacks. For this reason, during the first few months of treatment, your caregiver may also advise you to take medicines usually used for acute gout treatment. Be sure you understand your caregiver's directions. Your caregiver may make several adjustments to your medicine dose before these medicines are effective.  Discuss dietary treatment with your caregiver or  dietitian. Alcohol and drinks high in sugar and fructose and foods such as meat, poultry, and seafood can increase uric acid levels. Your caregiver or dietitian can advise you on drinks and foods that should be limited. HOME CARE INSTRUCTIONS   Do not take aspirin to relieve pain. This raises uric acid levels.  Only take over-the-counter or prescription medicines for pain, discomfort, or fever as directed by your caregiver.  Rest the joint as much as possible. When in bed, keep sheets and blankets off painful areas.  Keep the affected joint raised (elevated).  Apply warm or cold treatments to painful joints. Use of warm or cold treatments depends on which works best for you.  Use crutches if the painful joint is in your leg.  Drink enough fluids to keep your urine clear or pale yellow. This helps your body get rid of uric acid. Limit alcohol, sugary drinks, and fructose drinks.  Follow your dietary instructions. Pay careful attention to the amount of protein you eat. Your daily diet should emphasize fruits, vegetables, whole grains, and fat-free or low-fat milk products. Discuss the use of coffee, vitamin C, and cherries with your caregiver or dietitian. These may be helpful in lowering uric acid levels.  Maintain a healthy body weight. SEEK MEDICAL CARE IF:   You develop diarrhea, vomiting, or any side effects from medicines.  You do not feel better in 24 hours, or you are getting worse. SEEK IMMEDIATE MEDICAL CARE IF:   Your joint becomes suddenly more tender, and you have chills or a fever. MAKE SURE YOU:   Understand these instructions.  Will watch your condition.  Will get help right away if you are not doing well or get worse.   This information is not intended to replace advice given to you by your health care provider. Make sure you discuss any questions you have with your health care provider.   Document Released: 04/24/2000 Document Revised: 05/18/2014 Document  Reviewed: 12/09/2011 Elsevier Interactive Patient Education [employer].      Return in about 3 months (around 06/28/2015).   Yasmyn Bellisario, MD 03/28/2015

## 2015-04-01 ENCOUNTER — Telehealth: Payer: Self-pay

## 2015-04-01 NOTE — Telephone Encounter (Signed)
Pharm called and reported that pt picked up prednisone Rx after OV and has been taking it as Rx'd on sig, but the quantity of #12 was insufficient to continue taper (sig read " take 3 pills for 3 days, then 2 pills for 3 days, then 1 pill for 3 days", #12 given, would need #18 to complete as written). Checking OV notes, it appears that the sig was incorrect, not quantity. OV notes copied here: "Take prednisone 3 pills daily for 2 days, then 2 daily for 2 days, then 1 daily for 2 days."    What should we do to continue taper at this point? It appears pt would have had enough pills to take the 3 pills for 3 days, and then 2 pills for 1 day only, with 1 pill remaining. Pharmacist reported that she believes pt has 3 pills remaining at this time (so probably hasn't started the 2 pill QD dose).

## 2015-04-01 NOTE — Telephone Encounter (Signed)
I erred on the prescription bottle, correct on instruction sheet.    Rx 9 additional Prednisone pills to have enough for 2 daily for 3 days, then 1 daily for 3 days.

## 2015-04-02 NOTE — Telephone Encounter (Signed)
Called and advised pharmacy as to what to do to correct taper. Pharm agreed to give add'l tablets.

## 2015-04-07 ENCOUNTER — Encounter: Payer: Self-pay | Admitting: *Deleted

## 2015-04-08 ENCOUNTER — Telehealth: Payer: Self-pay

## 2015-04-08 NOTE — Telephone Encounter (Signed)
Call patient: Needs to come in and be seen. We cannot just call in prednisone. I know she recently was on it, and I'm concerned she is going to be having too frequent a courses of prednisone.

## 2015-04-08 NOTE — Telephone Encounter (Signed)
Pt states she is having a severe gout attack and would like to have something called in. Please call [facility]

## 2015-04-08 NOTE — Telephone Encounter (Signed)
Spoke with pt, she would like the prednisone to be called in.

## 2015-04-09 NOTE — Telephone Encounter (Signed)
Left message to RTC.

## 2015-04-25 DIAGNOSIS — E119 Type 2 diabetes mellitus without complications: Secondary | ICD-10-CM | POA: Diagnosis not present

## 2015-04-25 DIAGNOSIS — N184 Chronic kidney disease, stage 4 (severe): Secondary | ICD-10-CM | POA: Diagnosis not present

## 2015-04-25 DIAGNOSIS — M329 Systemic lupus erythematosus, unspecified: Secondary | ICD-10-CM | POA: Diagnosis not present

## 2015-04-25 DIAGNOSIS — I10 Essential (primary) hypertension: Secondary | ICD-10-CM | POA: Diagnosis not present

## 2015-04-25 DIAGNOSIS — M109 Gout, unspecified: Secondary | ICD-10-CM | POA: Diagnosis not present

## 2015-04-25 DIAGNOSIS — Z23 Encounter for immunization: Secondary | ICD-10-CM | POA: Diagnosis not present

## 2015-04-30 ENCOUNTER — Telehealth: Payer: Self-pay

## 2015-04-30 ENCOUNTER — Other Ambulatory Visit: Payer: Self-pay

## 2015-04-30 DIAGNOSIS — I10 Essential (primary) hypertension: Secondary | ICD-10-CM

## 2015-04-30 DIAGNOSIS — M10331 Gout due to renal impairment, right wrist: Secondary | ICD-10-CM

## 2015-04-30 DIAGNOSIS — N183 Chronic kidney disease, stage 3 unspecified: Secondary | ICD-10-CM

## 2015-04-30 MED ORDER — DILTIAZEM HCL ER COATED BEADS 120 MG PO CP24: 120.0000 mg | ORAL_CAPSULE | Freq: Every day | ORAL | Status: DC

## 2015-04-30 MED ORDER — ALLOPURINOL 100 MG PO TABS: 100.0000 mg | ORAL_TABLET | Freq: Every day | ORAL | Status: DC

## 2015-04-30 MED ORDER — LISINOPRIL 5 MG PO TABS: 5.0000 mg | ORAL_TABLET | Freq: Every day | ORAL | Status: DC

## 2015-04-30 MED ORDER — FUROSEMIDE 40 MG PO TABS: 40.0000 mg | ORAL_TABLET | Freq: Two times a day (BID) | ORAL | Status: DC

## 2015-04-30 MED ORDER — HYDRALAZINE HCL 50 MG PO TABS
50.0000 mg | ORAL_TABLET | Freq: Three times a day (TID) | ORAL | Status: DC
Start: 2015-04-30 — End: 2015-10-10

## 2015-04-30 NOTE — Telephone Encounter (Signed)
Dr Linna Darner, I received req from [facility] mail order for Rxs for several meds. I re-sent the Rxs for lisinopril and hydralazine that you recently sent to local pharm to mail order. Also req'd was prednisone, which I assume pt got filled for acute issue when here in Nov, and assumed you would not want to RF? Also requests for multiple DM testing supplies, but I don't see where we have ever Rxd for pt before, or even a Dx pt has that requires testing. Please advise.

## 2015-05-02 NOTE — Telephone Encounter (Signed)
I think she said Dr. Carlota Raspberry was to be her main doctor.  I agree with not refilling prednisone.  She needs a return visit to discuss her other medicine/supply needs.

## 2015-05-03 ENCOUNTER — Telehealth: Payer: Self-pay

## 2015-05-03 NOTE — Telephone Encounter (Signed)
Mail order- prior auth. For prednisone 20 mg  [phone]

## 2015-05-03 NOTE — Telephone Encounter (Signed)
Sent denial w/message back to pharm concerning need for OV for DM supplies.

## 2015-05-06 NOTE — Telephone Encounter (Signed)
Spoke with pt, she states she does not need that medication.

## 2015-05-08 DIAGNOSIS — N184 Chronic kidney disease, stage 4 (severe): Secondary | ICD-10-CM | POA: Diagnosis not present

## 2015-08-29 DIAGNOSIS — E119 Type 2 diabetes mellitus without complications: Secondary | ICD-10-CM | POA: Diagnosis not present

## 2015-08-29 DIAGNOSIS — I10 Essential (primary) hypertension: Secondary | ICD-10-CM | POA: Diagnosis not present

## 2015-08-29 DIAGNOSIS — M329 Systemic lupus erythematosus, unspecified: Secondary | ICD-10-CM | POA: Diagnosis not present

## 2015-08-29 DIAGNOSIS — N184 Chronic kidney disease, stage 4 (severe): Secondary | ICD-10-CM | POA: Diagnosis not present

## 2015-10-10 ENCOUNTER — Other Ambulatory Visit: Payer: Self-pay | Admitting: Family Medicine

## 2015-12-15 DIAGNOSIS — M109 Gout, unspecified: Secondary | ICD-10-CM | POA: Diagnosis not present

## 2015-12-25 ENCOUNTER — Other Ambulatory Visit: Payer: Self-pay | Admitting: Family Medicine

## 2015-12-27 ENCOUNTER — Other Ambulatory Visit: Payer: Self-pay | Admitting: Physician Assistant

## 2015-12-27 MED ORDER — LISINOPRIL 5 MG PO TABS: 5.0000 mg | ORAL_TABLET | Freq: Every day | ORAL | 0 refills | Status: DC

## 2015-12-27 NOTE — Telephone Encounter (Signed)
Called pt to advise she is overdue for f/up. She agreed to come in and will try to come tomorrow. Explained same day appts to pt. She asked that I send in 7 day supply of lisinopril locally. Done .

## 2016-02-21 IMAGING — CT CT HEAD W/O CM
2 series · 16 of 30 positions shown, 20 images · non-contrast
Comparison: None.

CLINICAL DATA: Blurred vision with left hand weakness and balance
difficulty for several weeks

EXAM:
CT HEAD WITHOUT CONTRAST
TECHNIQUE: Contiguous axial images were obtained from the base of the skull
through the vertex without intravenous contrast.

[Series 3: head bone · axial · 0.49mm/px · z∈[+15,+56]mm · 3 of 28 slices shown]
[im 2/28  bone]
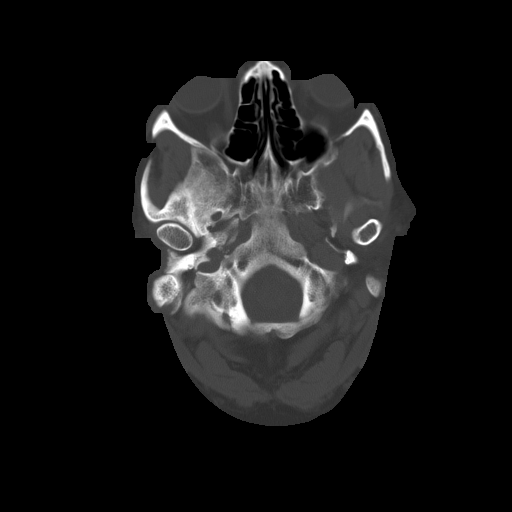
[im 6/28  bone]
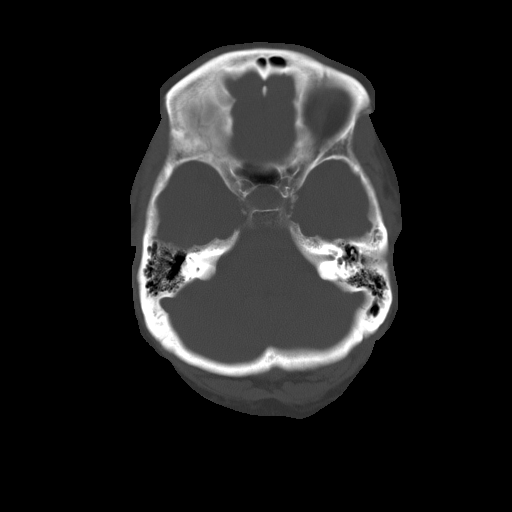
[im 10/28  bone]
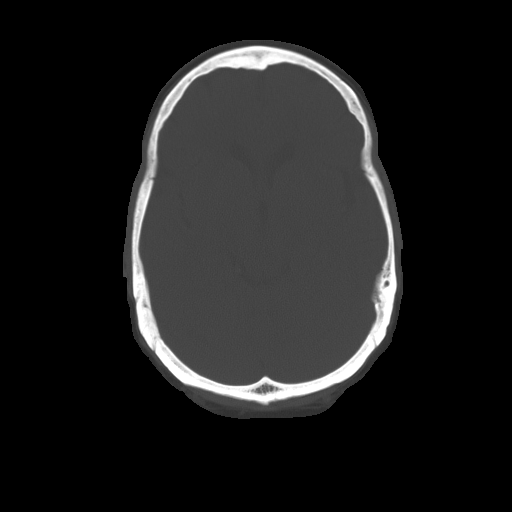

[Series 32: 3d filtered head w/o · axial · non-contrast · 0.49mm/px · z∈[+15,+138]mm · 13 of 28 slices shown, 17 images]
[im 2/28  brain]
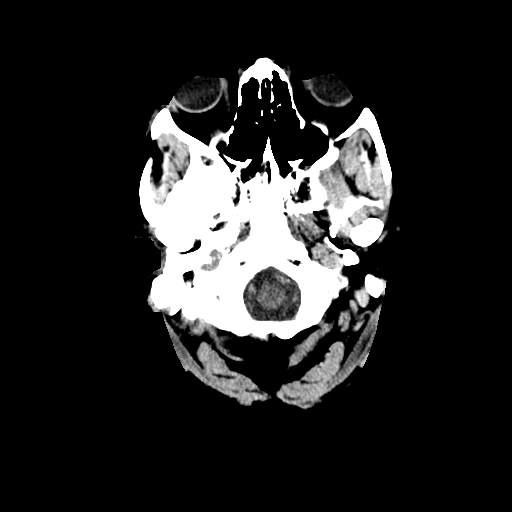
[im 2/28  bone]
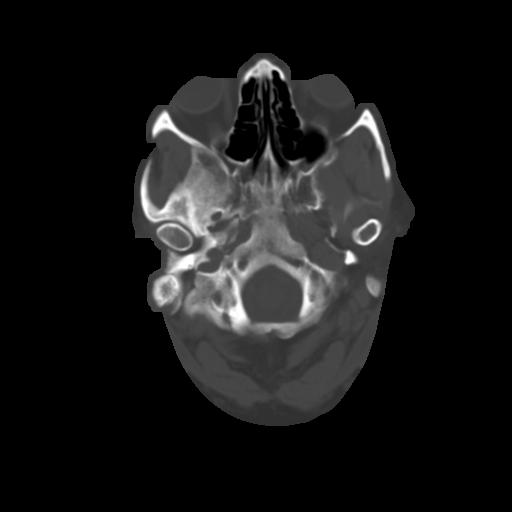
[im 4/28  brain]
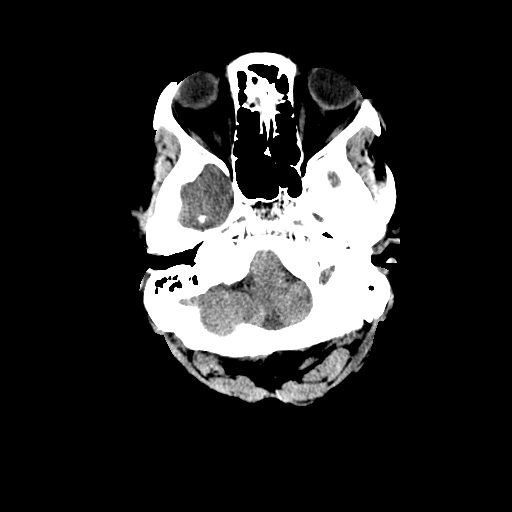
[im 6/28  brain]
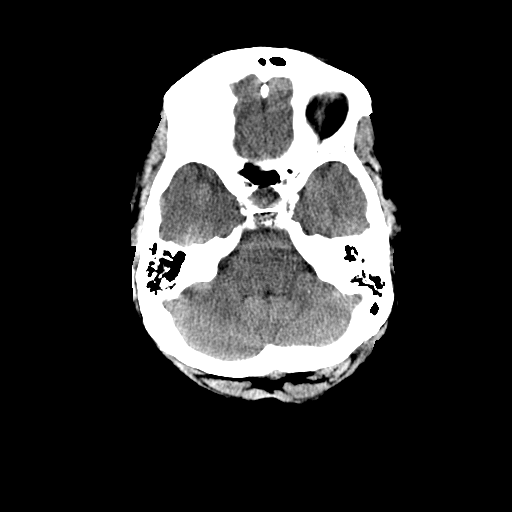
[im 8/28  brain]
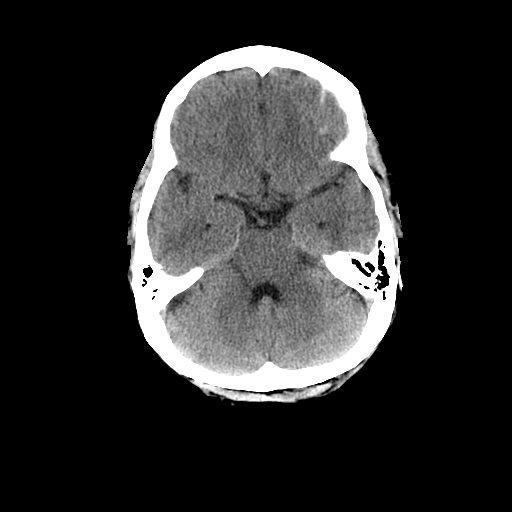
[im 10/28  brain]
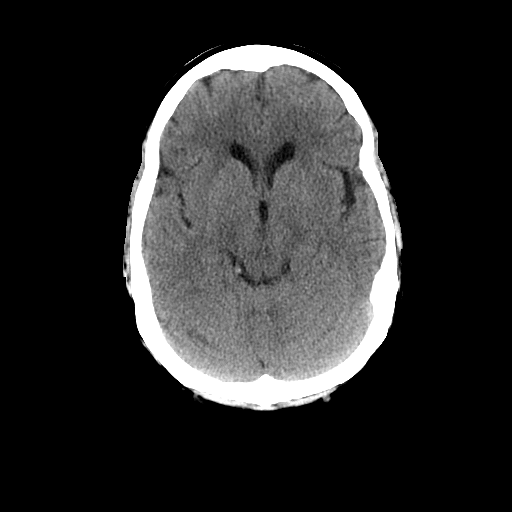
[im 10/28  bone]
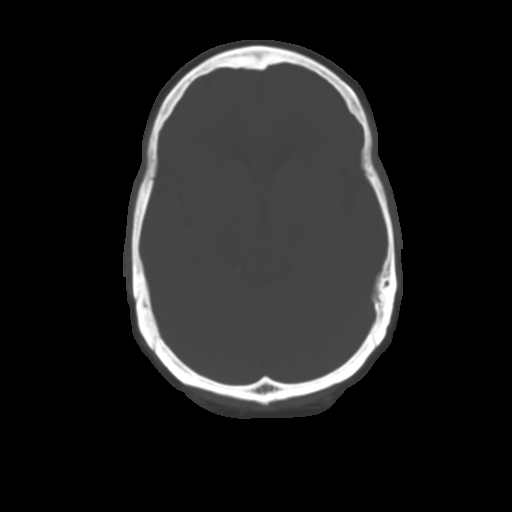
[im 12/28  brain]
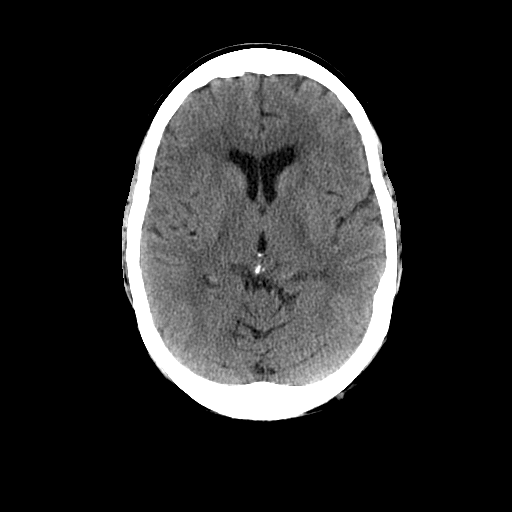
[im 14/28  brain]
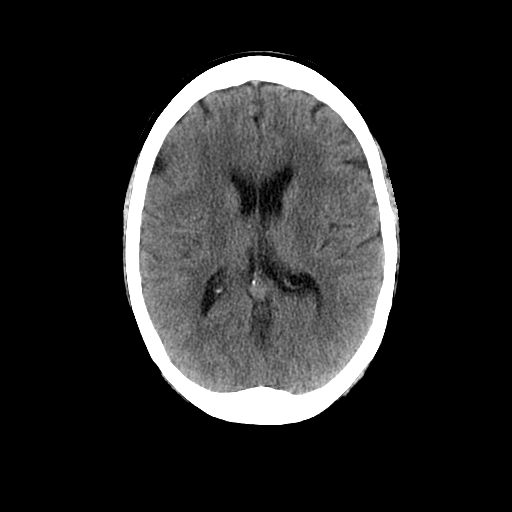
[im 16/28  brain]
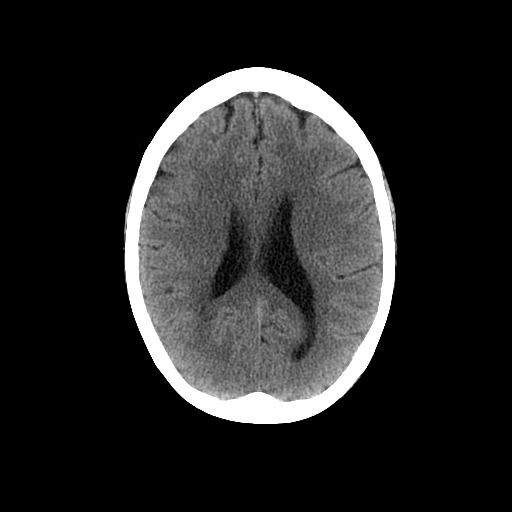
[im 18/28  brain]
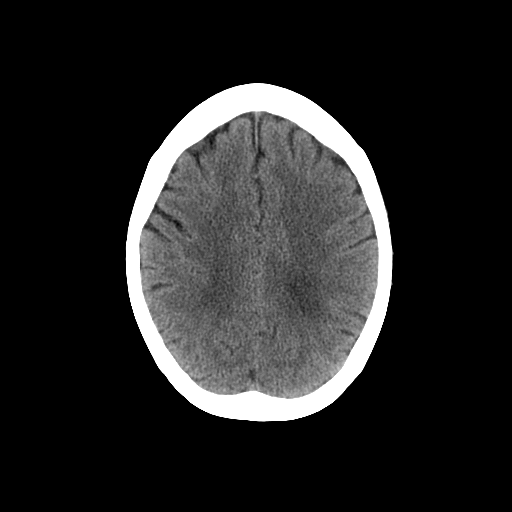
[im 18/28  bone]
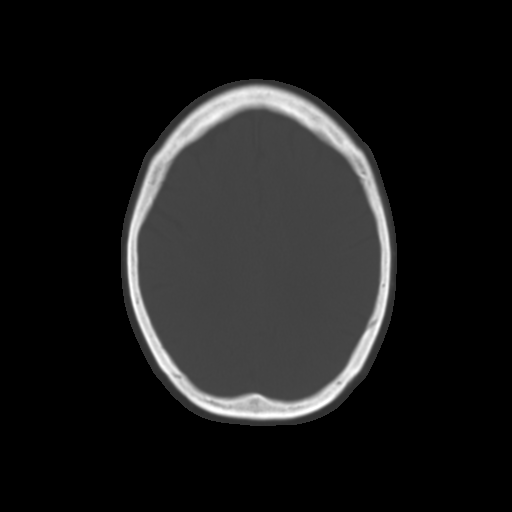
[im 20/28  brain]
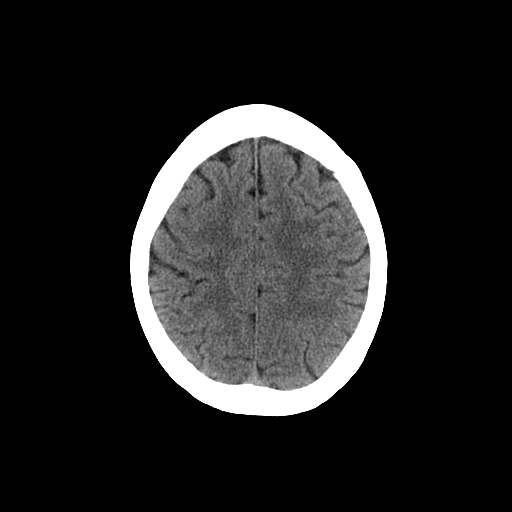
[im 22/28  brain]
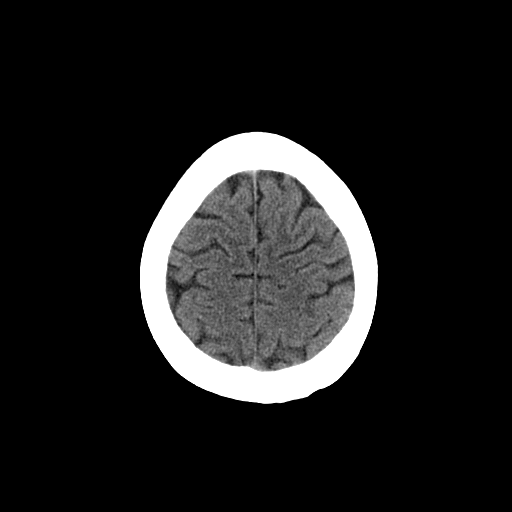
[im 24/28  brain]
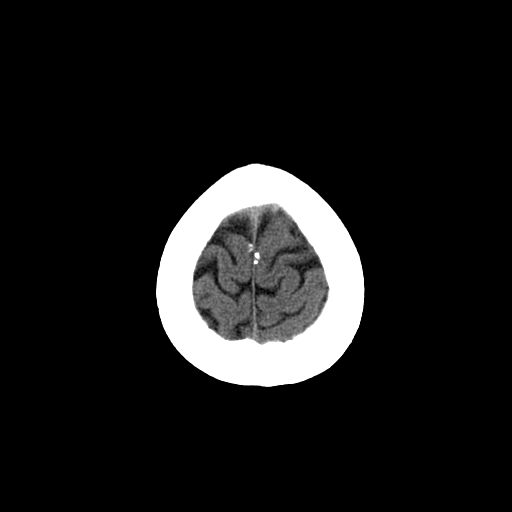
[im 26/28  brain]
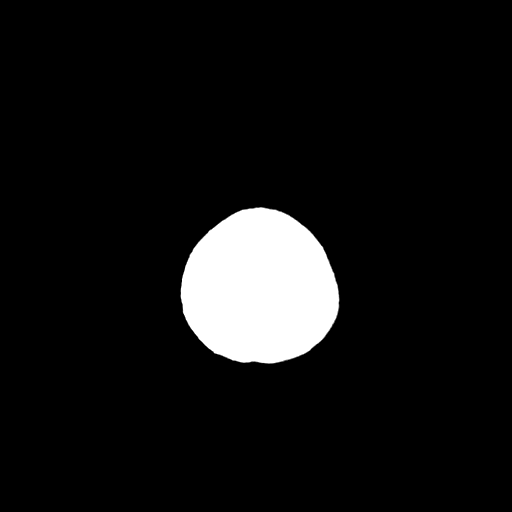
[im 26/28  bone]
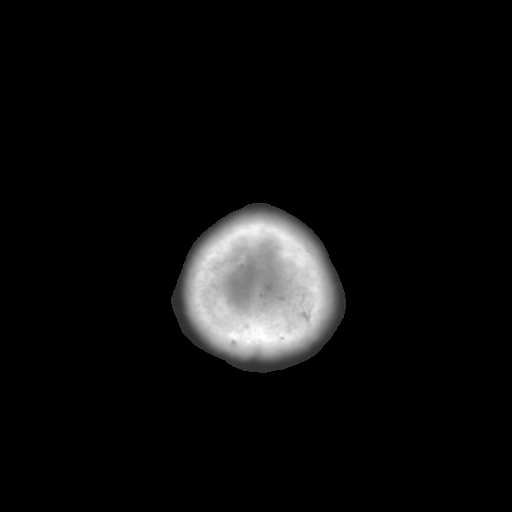

[16 of 30 positions shown; findings below may reference images not displayed]

FINDINGS: The ventricles are normal in size and configuration. There is no
intracranial mass, hemorrhage, extra-axial fluid collection, or
midline shift. There is mild patchy small vessel disease in the
centra semiovale bilaterally. Elsewhere gray-white compartments are
normal. No acute infarct is apparent. There is minimal basal ganglia
calcification, a finding that is felt to be physiologic. The bony
calvarium appears intact. The mastoid air cells are clear.
IMPRESSION: Mild periventricular small vessel disease. No intracranial mass,
hemorrhage, or acute appearing infarct.

## 2016-03-09 DIAGNOSIS — Z6832 Body mass index (BMI) 32.0-32.9, adult: Secondary | ICD-10-CM | POA: Diagnosis not present

## 2016-03-09 DIAGNOSIS — E119 Type 2 diabetes mellitus without complications: Secondary | ICD-10-CM | POA: Diagnosis not present

## 2016-03-09 DIAGNOSIS — Z23 Encounter for immunization: Secondary | ICD-10-CM | POA: Diagnosis not present

## 2016-03-09 DIAGNOSIS — M329 Systemic lupus erythematosus, unspecified: Secondary | ICD-10-CM | POA: Diagnosis not present

## 2016-03-09 DIAGNOSIS — N184 Chronic kidney disease, stage 4 (severe): Secondary | ICD-10-CM | POA: Diagnosis not present

## 2016-03-09 DIAGNOSIS — I10 Essential (primary) hypertension: Secondary | ICD-10-CM | POA: Diagnosis not present

## 2016-07-16 ENCOUNTER — Other Ambulatory Visit: Payer: Self-pay | Admitting: Family Medicine

## 2016-09-18 DIAGNOSIS — I10 Essential (primary) hypertension: Secondary | ICD-10-CM | POA: Diagnosis not present

## 2016-09-18 DIAGNOSIS — E119 Type 2 diabetes mellitus without complications: Secondary | ICD-10-CM | POA: Diagnosis not present

## 2016-09-18 DIAGNOSIS — M109 Gout, unspecified: Secondary | ICD-10-CM | POA: Diagnosis not present

## 2016-09-18 DIAGNOSIS — M329 Systemic lupus erythematosus, unspecified: Secondary | ICD-10-CM | POA: Diagnosis not present

## 2016-09-18 DIAGNOSIS — N184 Chronic kidney disease, stage 4 (severe): Secondary | ICD-10-CM | POA: Diagnosis not present

## 2017-01-11 ENCOUNTER — Encounter (HOSPITAL_COMMUNITY): Payer: Self-pay | Admitting: Emergency Medicine

## 2017-01-11 ENCOUNTER — Emergency Department (HOSPITAL_COMMUNITY): Payer: Medicare HMO

## 2017-01-11 ENCOUNTER — Inpatient Hospital Stay (HOSPITAL_COMMUNITY)
Admission: EM | Admit: 2017-01-11 | Discharge: 2017-01-14 | DRG: 392 | Disposition: A | Discharge status: Home / Self Care | Payer: Medicare HMO | Attending: Internal Medicine | Admitting: Internal Medicine

## 2017-01-11 DIAGNOSIS — K921 Melena: Secondary | ICD-10-CM | POA: Diagnosis present

## 2017-01-11 DIAGNOSIS — Z881 Allergy status to other antibiotic agents status: Secondary | ICD-10-CM | POA: Diagnosis not present

## 2017-01-11 DIAGNOSIS — Z90710 Acquired absence of both cervix and uterus: Secondary | ICD-10-CM | POA: Diagnosis not present

## 2017-01-11 DIAGNOSIS — N185 Chronic kidney disease, stage 5: Secondary | ICD-10-CM | POA: Diagnosis present

## 2017-01-11 DIAGNOSIS — N183 Chronic kidney disease, stage 3 (moderate): Secondary | ICD-10-CM | POA: Diagnosis not present

## 2017-01-11 DIAGNOSIS — K567 Ileus, unspecified: Secondary | ICD-10-CM | POA: Diagnosis not present

## 2017-01-11 DIAGNOSIS — R197 Diarrhea, unspecified: Secondary | ICD-10-CM | POA: Diagnosis not present

## 2017-01-11 DIAGNOSIS — I5020 Unspecified systolic (congestive) heart failure: Secondary | ICD-10-CM | POA: Diagnosis present

## 2017-01-11 DIAGNOSIS — I10 Essential (primary) hypertension: Secondary | ICD-10-CM | POA: Diagnosis present

## 2017-01-11 DIAGNOSIS — M10331 Gout due to renal impairment, right wrist: Secondary | ICD-10-CM

## 2017-01-11 DIAGNOSIS — M109 Gout, unspecified: Secondary | ICD-10-CM | POA: Diagnosis not present

## 2017-01-11 DIAGNOSIS — Z79899 Other long term (current) drug therapy: Secondary | ICD-10-CM | POA: Diagnosis not present

## 2017-01-11 DIAGNOSIS — M10361 Gout due to renal impairment, right knee: Secondary | ICD-10-CM | POA: Diagnosis not present

## 2017-01-11 DIAGNOSIS — I5022 Chronic systolic (congestive) heart failure: Secondary | ICD-10-CM | POA: Diagnosis not present

## 2017-01-11 DIAGNOSIS — A09 Infectious gastroenteritis and colitis, unspecified: Secondary | ICD-10-CM | POA: Diagnosis not present

## 2017-01-11 DIAGNOSIS — K449 Diaphragmatic hernia without obstruction or gangrene: Secondary | ICD-10-CM | POA: Diagnosis not present

## 2017-01-11 DIAGNOSIS — I252 Old myocardial infarction: Secondary | ICD-10-CM

## 2017-01-11 DIAGNOSIS — R933 Abnormal findings on diagnostic imaging of other parts of digestive tract: Secondary | ICD-10-CM | POA: Diagnosis not present

## 2017-01-11 DIAGNOSIS — Z23 Encounter for immunization: Secondary | ICD-10-CM | POA: Diagnosis not present

## 2017-01-11 DIAGNOSIS — N184 Chronic kidney disease, stage 4 (severe): Secondary | ICD-10-CM | POA: Diagnosis not present

## 2017-01-11 DIAGNOSIS — M329 Systemic lupus erythematosus, unspecified: Secondary | ICD-10-CM | POA: Diagnosis present

## 2017-01-11 DIAGNOSIS — I130 Hypertensive heart and chronic kidney disease with heart failure and stage 1 through stage 4 chronic kidney disease, or unspecified chronic kidney disease: Secondary | ICD-10-CM | POA: Diagnosis present

## 2017-01-11 DIAGNOSIS — K529 Noninfective gastroenteritis and colitis, unspecified: Secondary | ICD-10-CM | POA: Diagnosis not present

## 2017-01-11 DIAGNOSIS — R110 Nausea: Secondary | ICD-10-CM | POA: Diagnosis not present

## 2017-01-11 LAB — COMPREHENSIVE METABOLIC PANEL
ALT: 10 U/L — AB (ref 14–54)
AST: 16 U/L (ref 15–41)
Albumin: 3.9 g/dL (ref 3.5–5.0)
Alkaline Phosphatase: 128 U/L — ABNORMAL HIGH (ref 38–126)
Anion gap: 9 (ref 5–15)
BUN: 46 mg/dL — ABNORMAL HIGH (ref 6–20)
CHLORIDE: 111 mmol/L (ref 101–111)
CO2: 20 mmol/L — ABNORMAL LOW (ref 22–32)
CREATININE: 2.78 mg/dL — AB (ref 0.44–1.00)
Calcium: 9.5 mg/dL (ref 8.9–10.3)
GFR calc non Af Amer: 17 mL/min — ABNORMAL LOW (ref >60)
GFR, EST AFRICAN AMERICAN: 19 mL/min — AB (ref >60)
Glucose, Bld: 112 mg/dL — ABNORMAL HIGH (ref 65–99)
Potassium: 4.3 mmol/L (ref 3.5–5.1)
SODIUM: 140 mmol/L (ref 135–145)
Total Bilirubin: 0.5 mg/dL (ref 0.3–1.2)
Total Protein: 8.3 g/dL — ABNORMAL HIGH (ref 6.5–8.1)

## 2017-01-11 LAB — CBC
HEMATOCRIT: 36.3 % (ref 36.0–46.0)
HEMOGLOBIN: 11.5 g/dL — AB (ref 12.0–15.0)
MCH: 27.1 pg (ref 26.0–34.0)
MCHC: 31.7 g/dL (ref 30.0–36.0)
MCV: 85.4 fL (ref 78.0–100.0)
Platelets: 303 10*3/uL (ref 150–400)
RBC: 4.25 MIL/uL (ref 3.87–5.11)
RDW: 16 % — ABNORMAL HIGH (ref 11.5–15.5)
WBC: 13.8 10*3/uL — ABNORMAL HIGH (ref 4.0–10.5)

## 2017-01-11 LAB — URINALYSIS, ROUTINE W REFLEX MICROSCOPIC
BACTERIA UA: NONE SEEN
Bilirubin Urine: NEGATIVE
Glucose, UA: NEGATIVE mg/dL
Ketones, ur: NEGATIVE mg/dL
Leukocytes, UA: NEGATIVE
Nitrite: NEGATIVE
PROTEIN: 100 mg/dL — AB
Specific Gravity, Urine: 1.01 (ref 1.005–1.030)
pH: 5 (ref 5.0–8.0)

## 2017-01-11 LAB — HEMOGLOBIN AND HEMATOCRIT, BLOOD
HCT: 33.7 % — ABNORMAL LOW (ref 36.0–46.0)
HEMATOCRIT: 36.2 % (ref 36.0–46.0)
HEMOGLOBIN: 10.9 g/dL — AB (ref 12.0–15.0)
Hemoglobin: 11.3 g/dL — ABNORMAL LOW (ref 12.0–15.0)

## 2017-01-11 LAB — TYPE AND SCREEN
ABO/RH(D): B POS
Antibody Screen: NEGATIVE

## 2017-01-11 LAB — LIPASE, BLOOD: LIPASE: 18 U/L (ref 11–51)

## 2017-01-11 LAB — ABO/RH: ABO/RH(D): B POS

## 2017-01-11 MED ORDER — IOPAMIDOL (ISOVUE-300) INJECTION 61%
INTRAVENOUS | Status: AC
  Administered 2017-01-11: 30 mL via ORAL
  Filled 2017-01-11: qty 30

## 2017-01-11 MED ORDER — CIPROFLOXACIN IN D5W 400 MG/200ML IV SOLN
400.0000 mg | Freq: Once | INTRAVENOUS | Status: AC
  Administered 2017-01-11: 400 mg via INTRAVENOUS
  Filled 2017-01-11: qty 200

## 2017-01-11 MED ORDER — HYDRALAZINE HCL 50 MG PO TABS
50.0000 mg | ORAL_TABLET | Freq: Three times a day (TID) | ORAL | Status: DC
Start: 2017-01-11 — End: 2017-01-14
  Administered 2017-01-11 – 2017-01-14 (×10): 50 mg via ORAL
  Filled 2017-01-11 (×11): qty 1

## 2017-01-11 MED ORDER — PNEUMOCOCCAL VAC POLYVALENT 25 MCG/0.5ML IJ INJ
0.5000 mL | INJECTION | INTRAMUSCULAR | Status: AC
  Administered 2017-01-12: 0.5 mL via INTRAMUSCULAR
  Filled 2017-01-11: qty 0.5

## 2017-01-11 MED ORDER — SODIUM CHLORIDE 0.9 % IV SOLN
INTRAVENOUS | Status: DC
  Administered 2017-01-11 – 2017-01-12 (×4): via INTRAVENOUS

## 2017-01-11 MED ORDER — LEVOFLOXACIN IN D5W 500 MG/100ML IV SOLN
500.0000 mg | Freq: Once | INTRAVENOUS | Status: AC
  Administered 2017-01-12: 500 mg via INTRAVENOUS
  Filled 2017-01-11: qty 100

## 2017-01-11 MED ORDER — SODIUM CHLORIDE 0.9 % IV BOLUS (SEPSIS)
1000.0000 mL | Freq: Once | INTRAVENOUS | Status: AC
  Administered 2017-01-11: 1000 mL via INTRAVENOUS

## 2017-01-11 MED ORDER — IOPAMIDOL (ISOVUE-300) INJECTION 61%
30.0000 mL | Freq: Once | INTRAVENOUS | Status: AC | PRN
  Administered 2017-01-11: 30 mL via ORAL

## 2017-01-11 MED ORDER — LEVOFLOXACIN IN D5W 250 MG/50ML IV SOLN
250.0000 mg | INTRAVENOUS | Status: DC
  Administered 2017-01-13: 250 mg via INTRAVENOUS
  Filled 2017-01-11: qty 50

## 2017-01-11 MED ORDER — METRONIDAZOLE IN NACL 5-0.79 MG/ML-% IV SOLN
500.0000 mg | Freq: Once | INTRAVENOUS | Status: AC
  Administered 2017-01-11: 500 mg via INTRAVENOUS
  Filled 2017-01-11: qty 100

## 2017-01-11 NOTE — ED Notes (Signed)
Called ct; they said they would come to transport patient in 30 minutes, pt notified

## 2017-01-11 NOTE — ED Triage Notes (Signed)
Pt states that she has had lower abdominal pain since last night and this morning started passing clots in her stool. Alert and oriented.

## 2017-01-11 NOTE — ED Notes (Signed)
Awaiting ct  

## 2017-01-11 NOTE — Plan of Care (Signed)
Problem: Safety: Goal: Ability to remain free from injury will improve Outcome: Completed/Met Date Met: 01/11/17 BED ALARM SET. Discussed safety prevention plan with pt/family. Pt verbalized understanding. Pt is aware to call when getting out of bed

## 2017-01-11 NOTE — ED Notes (Signed)
PT aware of urine sample. Unable to provide at this time 

## 2017-01-11 NOTE — ED Notes (Signed)
Pt returned from CT °

## 2017-01-11 NOTE — ED Provider Notes (Signed)
[facility] DEPT Provider Note   CSN: [id number] Arrival date & time: 01/11/17  6378     History   Chief Complaint Chief Complaint  Patient presents with  . Blood In Stools  . Abdominal Pain    HPI Brittany Charles is a 66 y.o. female.  65 year old female presents with lower abdominal discomfort with associated diarrhea which is now evolved into bloody stools which began this morning. 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 Denies any use of blood thinners. No fever or chills. No nausea or vomiting. No prior history of same. Symptoms persistent and nothing makes them better. No treatment use prior to arrival      Past Medical History:  Diagnosis Date  . Arthritis   . CHF (congestive heart failure) ([place])   . Fibroids   . Hypertension   . Lupus (systemic lupus erythematosus) ([place])   . Myocardial infarction ([place])   . Renal insufficiency     Patient Active Problem List   Diagnosis Date Noted  . Fatigue 09/17/2014  . Systolic CHF ([place]) 58/85/0277  . CKD (chronic kidney disease), stage III 04/22/2011  . Hypertension 04/17/2011  . Gout 04/17/2011  . Lupus (systemic lupus erythematosus) ([place]) 04/17/2011    Past Surgical History:  Procedure Laterality Date  . ABDOMINAL HYSTERECTOMY  10/27/2000   TAH, BSO  . LEFT AND RIGHT HEART CATHETERIZATION WITH CORONARY ANGIOGRAM Right 04/21/2011   Procedure: LEFT AND RIGHT HEART CATHETERIZATION WITH CORONARY ANGIOGRAM;  Surgeon: [name], MD;  Location: [facility] CATH LAB;  Service: Cardiovascular;  Laterality: Right;  . TONSILLECTOMY      OB History    No data available       Home Medications    Prior to Admission medications   Medication Sig Start Date End Date Taking? Authorizing Provider  allopurinol (ZYLOPRIM) 100 MG tablet TAKE 1 TABLET EVERY DAY 10/10/15   Posey Boyer, MD  diltiazem (CARTIA XT) 120 MG 24 hr  capsule Take 1 capsule (120 mg total) by mouth daily. 04/30/15   Posey Boyer, MD  furosemide (LASIX) 40 MG tablet TAKE 1 TABLET TWICE DAILY 10/10/15   Posey Boyer, MD  hydrALAZINE (APRESOLINE) 50 MG tablet TAKE 1 TABLET THREE TIMES DAILY 10/10/15   Posey Boyer, MD  HYDROcodone-acetaminophen (NORCO) 5-325 MG tablet Take 1 every 4 hours as needed for severe pain only. 03/28/15   Posey Boyer, MD  lisinopril (PRINIVIL,ZESTRIL) 5 MG tablet Take 1 tablet (5 mg total) by mouth daily. 12/27/15   Tereasa Coop, PA-C  predniSONE (DELTASONE) 20 MG tablet Take 3 a day for 3 days 2 a day for 3 days one a day for 3 days 03/28/15   Posey Boyer, MD  traMADol (ULTRAM) 50 MG tablet Take 1 tablet (50 mg total) by mouth every 6 (six) hours as needed. Patient not taking: Reported on 03/28/2015 11/17/14   Darlyne Russian, MD  traMADol-acetaminophen (ULTRACET) 37.5-325 MG per tablet Take 1 tablet by mouth every 8 (eight) hours as needed for severe pain (Do not take with tramadol.). Patient not taking: Reported on 11/17/2014 09/17/14   Tereasa Coop, PA-C    Family History Family History  Problem Relation Age of Onset  . Hypertension Mother   . Heart disease Mother        CHF  . Stroke Mother   . Hyperlipidemia Father   .  Stroke Father   . Hypertension Father   . Colon cancer Unknown   . Breast cancer Unknown   . Stroke Maternal Grandmother     Social History Social History  Substance Use Topics  . Smoking status: Never Smoker  . Smokeless tobacco: Never Used  . Alcohol use No     Allergies   Zithromax [azithromycin]   Review of Systems Review of Systems  All other systems reviewed and are negative.    Physical Exam Updated Vital Signs BP (!) 170/76 (BP Location: Left Arm)   Pulse 70   Temp 98.7 F (37.1 C) (Oral)   Resp 16   SpO2 98%   Physical Exam  Constitutional: She is oriented to person, place, and time. She appears well-developed and well-nourished.  Non-toxic  appearance. No distress.  HENT:  Head: Normocephalic and atraumatic.  Eyes: Pupils are equal, round, and reactive to light. Conjunctivae, EOM and lids are normal.  Neck: Normal range of motion. Neck supple. No tracheal deviation present. No thyroid mass present.  Cardiovascular: Normal rate, regular rhythm and normal heart sounds.  Exam reveals no gallop.   No murmur heard. Pulmonary/Chest: Effort normal and breath sounds normal. No stridor. No respiratory distress. She has no decreased breath sounds. She has no wheezes. She has no rhonchi. She has no rales.  Abdominal: Soft. Normal appearance and bowel sounds are normal. She exhibits no distension. There is no tenderness. There is no rebound and no CVA tenderness.  Genitourinary:  Genitourinary Comments: Gross blood per rectum noted  Musculoskeletal: Normal range of motion. She exhibits no edema or tenderness.  Neurological: She is alert and oriented to person, place, and time. She has normal strength. No cranial nerve deficit or sensory deficit. GCS eye subscore is 4. GCS verbal subscore is 5. GCS motor subscore is 6.  Skin: Skin is warm and dry. No abrasion and no rash noted.  Psychiatric: She has a normal mood and affect. Her speech is normal and behavior is normal.  Nursing note and vitals reviewed.    ED Treatments / Results  Labs (all labs ordered are listed, but only abnormal results are displayed) Labs Reviewed  LIPASE, BLOOD  COMPREHENSIVE METABOLIC PANEL  CBC  URINALYSIS, ROUTINE W REFLEX MICROSCOPIC  POC OCCULT BLOOD, ED  TYPE AND SCREEN    EKG  EKG Interpretation None       Radiology No results found.  Procedures Procedures (including critical care time)  Medications Ordered in ED Medications  sodium chloride 0.9 % bolus 1,000 mL (not administered)  0.9 %  sodium chloride infusion (not administered)     Initial Impression / Assessment and Plan / ED Course  I have reviewed the triage vital signs and the  nursing notes.  Pertinent labs & imaging results that were available during my care of the patient were reviewed by me and considered in my medical decision making (see chart for details).     Patient given IV fluids here and started on IV antibiotics. Will be admitted for treatment of her colitis  Final Clinical Impressions(s) / ED Diagnoses   Final diagnoses:  None    New Prescriptions New Prescriptions   No medications on file     Lacretia Leigh, MD 01/11/17 1420

## 2017-01-11 NOTE — Progress Notes (Signed)
Patient arrived to unit. Pt is A&Ox4, ambulatory. Discussed safety precaution/prevention measures. Pt verbalized understanding. Pt will call should assistance be needed. Bed alarm in place. Pt oriented to room, call bell within reach.

## 2017-01-11 NOTE — ED Notes (Signed)
At this time PT unable to provide urine sample 

## 2017-01-11 NOTE — ED Notes (Signed)
Requested urine sample.  

## 2017-01-11 NOTE — Progress Notes (Signed)
Please call Amber report at [phone] at [phone].   Andre Lefort

## 2017-01-11 NOTE — Progress Notes (Signed)
PHARMACY NOTE:  ANTIMICROBIAL RENAL DOSAGE ADJUSTMENT  Current antimicrobial regimen includes a mismatch between antimicrobial dosage and estimated renal function.  As per policy approved by the Pharmacy & Therapeutics and Medical Executive Committees, the antimicrobial dosage will be adjusted accordingly.  Current antimicrobial dosage:  Levaquin 500mg   Indication: Colitis  Renal Function:  Estimated Creatinine Clearance: 23 mL/min (A) (by C-G formula based on SCr of 2.78 mg/dL (H)). []      On intermittent HD, scheduled: []      On CRRT    Antimicrobial dosage has been changed to:  Levaquin 500mg  IV followed by 250mg  IV q48h   Thank you for allowing pharmacy to be a part of this patient's care.  Gretta Arab PharmD, BCPS Pager [phone] 01/11/2017 4:37 PM

## 2017-01-11 NOTE — ED Notes (Signed)
ED Provider at bedside. Dr Zenia Resides

## 2017-01-11 NOTE — H&P (Signed)
History and Physical:    Brittany Charles   PZW:[id number] DOB: 01/05/1951 DOA: 01/11/2017  Referring MD/provider: Dr. Zenia Resides PCP: Tereasa Coop, PA-C   Patient coming from: Home  Chief Complaint: acute abdominal cramping and bloody diarrhea  History of Present Illness:   Brittany Charles is an 66 y.o. female with past medical history significant for SLE in her remission for several years not on any immunosuppressants, hypertension, systolic dysfunction and stage IV chronic renal failure who was in her usual state of reasonably good health until yesterday when she developed sudden onset of acute abdominal pain and cramping 2 hours after eating at [employer]. Abdominal cramping was followed by several episodes of watery diarrhea until early this morning when she noted that her stool had turned bloody. Patient states she has had approximately 12 stools since last night of which the last 5 have been bloody. Patient does feel that her stooling is decreasing as she is only had 2 bowel movements over the past 6 hours.  Patient denies fevers or chills. She states she of course does not feel well in general. Patient admits to ongoing acute abdominal cramping not improved with bowel movement.She is not on any medications other than those prescribed to her. Patient denies any sick contacts. She has not traveled outside of the state. Patient states that she has not had any flares of her lupus times several years, previously followed at [facility], no longer followed there.  ED Course:  The patient was treated with IV fluids and kept nothing by mouth. CT was notable for segmentalcolitis involving the proximal, transverse and descending colon. She also has a mild ileus. She is admitted for ongoing management of colitis.  ROS:   ROS 10 point review of systems otherwise negative unless as mentioned in history of present illness.  Past Medical History:   Past Medical History:  Diagnosis Date  .  Arthritis   . CHF (congestive heart failure) ([place])   . Fibroids   . Hypertension   . Lupus (systemic lupus erythematosus) ([place])   . Myocardial infarction ([place])   . Renal insufficiency     Past Surgical History:   Past Surgical History:  Procedure Laterality Date  . ABDOMINAL HYSTERECTOMY  10/27/2000   TAH, BSO  . LEFT AND RIGHT HEART CATHETERIZATION WITH CORONARY ANGIOGRAM Right 04/21/2011   Procedure: LEFT AND RIGHT HEART CATHETERIZATION WITH CORONARY ANGIOGRAM;  Surgeon: [name], MD;  Location: [facility] CATH LAB;  Service: Cardiovascular;  Laterality: Right;  . TONSILLECTOMY      Social History:   Social History   Social History  . Marital status: Single    Spouse name: N/A  . Number of children: N/A  . Years of education: N/A   Occupational History  . Not on file.   Social History Main Topics  . Smoking status: Never Smoker  . Smokeless tobacco: Never Used  . Alcohol use No  . Drug use: No  . Sexual activity: No   Other Topics Concern  . Not on file   Social History Narrative  . No narrative on file    Allergies   Zithromax [azithromycin]  Family history:   Family History  Problem Relation Age of Onset  . Hypertension Mother   . Heart disease Mother        CHF  . Stroke Mother   . Hyperlipidemia Father   . Stroke Father   . Hypertension Father   . Colon cancer Unknown   .  Breast cancer Unknown   . Stroke Maternal Grandmother     Current Medications:   Prior to Admission medications   Medication Sig Start Date End Date Taking? Authorizing Provider  acetaminophen (TYLENOL) 500 MG tablet Take 1,000 mg by mouth every 6 (six) hours as needed for moderate pain.   Yes [provider]  allopurinol (ZYLOPRIM) 100 MG tablet TAKE 1 TABLET EVERY DAY 10/10/15  Yes Posey Boyer, MD  CARTIA XT 240 MG 24 hr capsule Take 240 mg by mouth daily. 10/23/16  Yes [provider]  furosemide (LASIX) 40 MG tablet TAKE 1 TABLET TWICE DAILY 10/10/15   Yes Posey Boyer, MD  hydrALAZINE (APRESOLINE) 50 MG tablet TAKE 1 TABLET THREE TIMES DAILY 10/10/15  Yes Posey Boyer, MD  lisinopril (PRINIVIL,ZESTRIL) 10 MG tablet Take 10 mg by mouth daily.   Yes [provider]  diltiazem (CARTIA XT) 120 MG 24 hr capsule Take 1 capsule (120 mg total) by mouth daily. Patient not taking: Reported on 01/11/2017 04/30/15   Posey Boyer, MD  HYDROcodone-acetaminophen (NORCO) 5-325 MG tablet Take 1 every 4 hours as needed for severe pain only. Patient not taking: Reported on 01/11/2017 03/28/15   Posey Boyer, MD  lisinopril (PRINIVIL,ZESTRIL) 5 MG tablet Take 1 tablet (5 mg total) by mouth daily. Patient not taking: Reported on 01/11/2017 12/27/15   Tereasa Coop, PA-C  predniSONE (DELTASONE) 20 MG tablet Take 3 a day for 3 days 2 a day for 3 days one a day for 3 days Patient not taking: Reported on 01/11/2017 03/28/15   Posey Boyer, MD  traMADol (ULTRAM) 50 MG tablet Take 1 tablet (50 mg total) by mouth every 6 (six) hours as needed. Patient not taking: Reported on 03/28/2015 11/17/14   Darlyne Russian, MD  traMADol-acetaminophen (ULTRACET) 37.5-325 MG per tablet Take 1 tablet by mouth every 8 (eight) hours as needed for severe pain (Do not take with tramadol.). Patient not taking: Reported on 11/17/2014 09/17/14   Tereasa Coop, PA-C    Physical Exam:   Vitals:   01/11/17 1352 01/11/17 1400 01/11/17 1420 01/11/17 1421  BP: (!) 186/64 (!) 201/137 (!) 186/81   Pulse: 65 73 68   Resp: 16  17   Temp:      TempSrc:      SpO2: 100% 100% 100%   Weight:    95.3 kg (210 lb)  Height:    5\' 5"  (1.651 m)     Physical Exam: Blood pressure (!) 186/81, pulse 68, temperature 98.7 F (37.1 C), temperature source Oral, resp. rate 17, height 5\' 5"  (1.651 m), weight 95.3 kg (210 lb), SpO2 100 %. Gen: patient sitting on side of bed hunched over in mild to moderate distress. Eyes: Sclerae anicteric. Conjunctiva mildly injected. Neck: Supple, no jugular  venous distention. Chest: Moderately good air entry bilaterally with no adventitious sounds.  CV: Distant, regular, no audible murmurs. Abdomen: she does have bowel sounds. She has diffuse tenderness to light palpation lower quadrants greater than upper quadrants.She oes not have any rebound tenderness but she does have some voluntary guarding Extremities: No edema.  Skin: Warm and dry. No rashes, lesions or wounds. Neuro: Alert and oriented times 3; grossly nonfocal. Psych: Patient is cooperative, logical and coherent with appropriate mood and affect.  Data Review:    Labs: Basic Metabolic Panel:  Recent Labs Lab 01/11/17 0902  NA 140  K 4.3  CL 111  CO2 20*  GLUCOSE 112*  BUN 46*  CREATININE 2.78*  CALCIUM 9.5   Liver Function Tests:  Recent Labs Lab 01/11/17 0902  AST 16  ALT 10*  ALKPHOS 128*  BILITOT 0.5  PROT 8.3*  ALBUMIN 3.9    Recent Labs Lab 01/11/17 0902  LIPASE 18   No results for input(s): AMMONIA in the last 168 hours. CBC:  Recent Labs Lab 01/11/17 0902  WBC 13.8*  HGB 11.5*  HCT 36.3  MCV 85.4  PLT 303   Cardiac Enzymes: No results for input(s): CKTOTAL, CKMB, CKMBINDEX, TROPONINI in the last 168 hours.  BNP (last 3 results) No results for input(s): PROBNP in the last 8760 hours. CBG: No results for input(s): GLUCAP in the last 168 hours.  Urinalysis    Component Value Date/Time   COLORURINE STRAW (A) 01/11/2017 1338   APPEARANCEUR CLEAR 01/11/2017 1338   LABSPEC 1.010 01/11/2017 1338   PHURINE 5.0 01/11/2017 1338   GLUCOSEU NEGATIVE 01/11/2017 1338   HGBUR SMALL (A) 01/11/2017 1338   BILIRUBINUR NEGATIVE 01/11/2017 1338   BILIRUBINUR neg 09/12/2014 1612   KETONESUR NEGATIVE 01/11/2017 1338   PROTEINUR 100 (A) 01/11/2017 1338   UROBILINOGEN 0.2 09/12/2014 1612   UROBILINOGEN 0.2 10/01/2012 1845   NITRITE NEGATIVE 01/11/2017 1338   LEUKOCYTESUR NEGATIVE 01/11/2017 1338      Radiographic Studies: Ct Abdomen Pelvis Wo  Contrast  Result Date: 01/11/2017 CLINICAL DATA:  Lower abdominal pain since last night. Blood clots in her stool. EXAM: CT ABDOMEN AND PELVIS WITHOUT CONTRAST TECHNIQUE: Multidetector CT imaging of the abdomen and pelvis was performed following the standard protocol without IV contrast. COMPARISON:  Abdomen and pelvis CT dated 10/01/2012 and abdomen ultrasound dated 09/11/2012. FINDINGS: Lower chest: Mild linear atelectasis/ scarring at both lung bases. Hepatobiliary: Poorly distended gallbladder containing interval medium density material. Normal appearing liver. Pancreas: Unremarkable. No pancreatic ductal dilatation or surrounding inflammatory changes. Spleen: Normal in size without focal abnormality. Adrenals/Urinary Tract: Interval increase in size of a left mid upper pole renal cyst, currently measuring 2.0 cm in maximum diameter. Normal appearing right kidney, ureters and urinary bladder. No urinary tract calculi or hydronephrosis seen. Stomach/Bowel: Small to moderate-sized hiatal hernia. Diffuse low density wall thickening involving the distal transverse colon, splenic flexure and proximal descending colon with mild pericolonic soft tissue stranding. Interval mildly dilated loops of jejunum in the left mid and upper abdomen. Normal caliber distal small bowel and colon. Normal appearing appendix. Vascular/Lymphatic: Atheromatous arterial calcifications without aneurysm. No enlarged lymph nodes. Reproductive: Status post hysterectomy. No adnexal masses. Other: Small umbilical hernia containing fat. Musculoskeletal: Lower thoracic spine degenerative changes and mild lumbar spine degenerative changes. IMPRESSION: 1. Segmental colitis involving the distal transverse colon, splenic flexure and proximal descending colon. This could be infectious, inflammatory or ischemic in nature. 2. Mild ileus involving the proximal and mid jejunum due to the adjacent colitis. 3. Small to moderate-sized hiatal hernia. 4.  Probable sludge or noncalcified gallstones in the gallbladder. Electronically Signed   By: Claudie Revering M.D.   On: 01/11/2017 13:41     Assessment/Plan:   Principal Problem:   Hematochezia Active Problems:   Hypertension   Gout   Lupus (systemic lupus erythematosus) (HCC)   CKD (chronic kidney disease), stage III   Systolic CHF ([place])   HEMATOCHEZIA Patient with extensive, segmental colitis involving the entire colon. This sounds like it's most likely infectious in etiology, however it would be unusual for a toxin mediated diarrhea to become inflammatory in this way. It  is possible that she was exposed to an enteric pathogen 3-4 days ago and that is the actual incubation period, but difficult to tell. Given known SLE and extensive colitis that is likely bacterial, will treat patient with Levaquin. Given known significant kidney disease with GFR of 19, will dose Levaquin conservatively per up-to-date. Can consider GI consultation in the morning if hematochezia and diarrhea have not slowed down considerably with treatment.  CKD Significant kidney disease with creatinine clearance less than 30 at baseline. Patient's kidney function is at baseline.  HTN Patient is hypertensive despite GI bleed and diarrhea. We'll hold lisinopril in case patient has intravascular volume depletion. Hold diltiazem as we would like for her to have compensatory tachycardia if necessary for hypotension. Will continue hydralazine although would have low threshold for discontinuing of her blood pressure drops.  SLE Patient states she has not been on prednisone for the past 6 months. No need for stress dose steroids  ELEVATED TOTAL PROTEIN Of incidental note, patient has total protein of 8.3 and albumin of 3.9 making a gamma gap of 4.4 This can be worked up as an outpatient if warranted.  SYSTOLIC DYSFUNCTION Is compensated at present. Holding Lasix for now Would restart home medications upon  discharge.   Other information:   DVT prophylaxis: Lovenox ordered. Code Status: Full code. Family Communication: patient states her family know she is in the hospital.  Disposition Plan: home Consults called: ID curbside Admission status: inpatient   The medical decision making on this patient was of high complexity and the patient is at high risk for clinical deterioration, therefore this is a level 3 visit.  Dewaine Oats Derek Jack [facility] Pager [phone] Cell: [phone]   If 7PM-7AM, please contact night-coverage [email or web address] Password [email or web address] 01/11/2017, 3:24 PM

## 2017-01-12 DIAGNOSIS — R110 Nausea: Secondary | ICD-10-CM

## 2017-01-12 DIAGNOSIS — I5022 Chronic systolic (congestive) heart failure: Secondary | ICD-10-CM | POA: Diagnosis present

## 2017-01-12 DIAGNOSIS — I252 Old myocardial infarction: Secondary | ICD-10-CM | POA: Diagnosis not present

## 2017-01-12 DIAGNOSIS — N183 Chronic kidney disease, stage 3 (moderate): Secondary | ICD-10-CM | POA: Diagnosis not present

## 2017-01-12 DIAGNOSIS — R933 Abnormal findings on diagnostic imaging of other parts of digestive tract: Secondary | ICD-10-CM | POA: Diagnosis not present

## 2017-01-12 DIAGNOSIS — Z881 Allergy status to other antibiotic agents status: Secondary | ICD-10-CM | POA: Diagnosis not present

## 2017-01-12 DIAGNOSIS — R197 Diarrhea, unspecified: Secondary | ICD-10-CM

## 2017-01-12 DIAGNOSIS — Z23 Encounter for immunization: Secondary | ICD-10-CM | POA: Diagnosis present

## 2017-01-12 DIAGNOSIS — I130 Hypertensive heart and chronic kidney disease with heart failure and stage 1 through stage 4 chronic kidney disease, or unspecified chronic kidney disease: Secondary | ICD-10-CM | POA: Diagnosis present

## 2017-01-12 DIAGNOSIS — N184 Chronic kidney disease, stage 4 (severe): Secondary | ICD-10-CM | POA: Diagnosis present

## 2017-01-12 DIAGNOSIS — M10361 Gout due to renal impairment, right knee: Secondary | ICD-10-CM | POA: Diagnosis not present

## 2017-01-12 DIAGNOSIS — M329 Systemic lupus erythematosus, unspecified: Secondary | ICD-10-CM | POA: Diagnosis present

## 2017-01-12 DIAGNOSIS — K529 Noninfective gastroenteritis and colitis, unspecified: Secondary | ICD-10-CM | POA: Diagnosis not present

## 2017-01-12 DIAGNOSIS — Z90710 Acquired absence of both cervix and uterus: Secondary | ICD-10-CM | POA: Diagnosis not present

## 2017-01-12 DIAGNOSIS — K921 Melena: Secondary | ICD-10-CM | POA: Diagnosis present

## 2017-01-12 DIAGNOSIS — K567 Ileus, unspecified: Secondary | ICD-10-CM | POA: Diagnosis present

## 2017-01-12 DIAGNOSIS — Z79899 Other long term (current) drug therapy: Secondary | ICD-10-CM | POA: Diagnosis not present

## 2017-01-12 DIAGNOSIS — A09 Infectious gastroenteritis and colitis, unspecified: Secondary | ICD-10-CM | POA: Diagnosis present

## 2017-01-12 DIAGNOSIS — M109 Gout, unspecified: Secondary | ICD-10-CM | POA: Diagnosis present

## 2017-01-12 LAB — BASIC METABOLIC PANEL
Anion gap: 10 (ref 5–15)
BUN: 32 mg/dL — ABNORMAL HIGH (ref 6–20)
CHLORIDE: 114 mmol/L — AB (ref 101–111)
CO2: 17 mmol/L — AB (ref 22–32)
CREATININE: 2.24 mg/dL — AB (ref 0.44–1.00)
Calcium: 9.4 mg/dL (ref 8.9–10.3)
GFR calc non Af Amer: 22 mL/min — ABNORMAL LOW (ref >60)
GFR, EST AFRICAN AMERICAN: 25 mL/min — AB (ref >60)
Glucose, Bld: 97 mg/dL (ref 65–99)
POTASSIUM: 4.4 mmol/L (ref 3.5–5.1)
SODIUM: 141 mmol/L (ref 135–145)

## 2017-01-12 LAB — HEMOGLOBIN AND HEMATOCRIT, BLOOD
HCT: 36.3 % (ref 36.0–46.0)
HEMATOCRIT: 35.1 % — AB (ref 36.0–46.0)
HEMOGLOBIN: 11.5 g/dL — AB (ref 12.0–15.0)
HEMOGLOBIN: 11 g/dL — AB (ref 12.0–15.0)

## 2017-01-12 LAB — HIV ANTIBODY (ROUTINE TESTING W REFLEX): HIV Screen 4th Generation wRfx: NONREACTIVE

## 2017-01-12 MED ORDER — ALLOPURINOL 100 MG PO TABS
100.0000 mg | ORAL_TABLET | Freq: Every day | ORAL | Status: DC
  Administered 2017-01-12 – 2017-01-14 (×3): 100 mg via ORAL
  Filled 2017-01-12 (×3): qty 1

## 2017-01-12 MED ORDER — ONDANSETRON 4 MG PO TBDP
4.0000 mg | ORAL_TABLET | Freq: Two times a day (BID) | ORAL | Status: DC
  Administered 2017-01-12: 4 mg via ORAL
  Filled 2017-01-12 (×4): qty 1

## 2017-01-12 MED ORDER — ACETAMINOPHEN 325 MG PO TABS
650.0000 mg | ORAL_TABLET | Freq: Four times a day (QID) | ORAL | Status: DC | PRN
  Administered 2017-01-12 – 2017-01-14 (×5): 650 mg via ORAL
  Filled 2017-01-12 (×6): qty 2

## 2017-01-12 MED ORDER — LISINOPRIL 10 MG PO TABS: 10.0000 mg | ORAL_TABLET | Freq: Every day | ORAL | Status: DC

## 2017-01-12 NOTE — Care Management Note (Signed)
Case Management Note  Patient Details  Name: Brittany Charles MRN: [id number] Date of Birth: 1950/09/05  Subjective/Objective: 66 y/o f admitted w/hematochezia. From home.                   Action/Plan:d/c plan home.   Expected Discharge Date:  01/13/17               Expected Discharge Plan:  Home/Self Care  In-House Referral:     Discharge planning Services  CM Consult  Post Acute Care Choice:    Choice offered to:     DME Arranged:    DME Agency:     HH Arranged:    HH Agency:     Status of Service:  In process, will continue to follow  If discussed at Long Length of Stay Meetings, dates discussed:    Additional Comments:  Dessa Phi, RN 01/12/2017, 1:44 PM

## 2017-01-12 NOTE — Consult Note (Signed)
Consultation  Referring Provider: Dr. Charlies Silvers     Primary Care Physician:  Tereasa Coop, PA-C Primary Gastroenterologist: Althia Forts         Reason for Consultation:  Hematochezia, Diarrhea           HPI:   Brittany Charles is a 66 y.o. female with a past medical history as listed below including SLE, who presented to the ED on 01/11/17 with a complaint of abdominal discomfort associated with diarrhea and bloody stools.    Today, the patient explains that yesterday she felt fine until eating at [facility] for dinner and about 30 minutes later when walking around the [facility], she developed severe abdominal cramping and about an hour afterwards she had her first episode of explosive loose diarrhea. She had about 3 bowel movements while at the [facility] and then finally went home, she continued with bowel movements which eventually turned to bright red blood. Patient tells me she had at least 5 episodes while at home, some of these mixed with "blood clots". She then came to the ED and had 2 further yesterday. The last one had no bright red blood that the patient could see after wiping. She has not had a bowel movement today. Overall her abdominal pain is gone and that she has had a decrease in bowel movements and her symptoms are much better. Associated symptoms include nausea and when the patient tries to vomit she tells me it is just "a clear bubbly material". This has never happened for the patient before.   Patient denies fever, chills, weight loss, anorexia, heartburn or reflux.  ED Course:  The patient was treated with IV fluids and kept nothing by mouth. CT was notable for segmentalcolitis involving the proximal, transverse and descending colon. She also has a mild ileus. She is admitted for ongoing management of colitis.   No previous GI history  Past Medical History:  Diagnosis Date  . Arthritis   . CHF (congestive heart failure) ([place])   . Fibroids   . Hypertension    . Lupus (systemic lupus erythematosus) ([place])   . Myocardial infarction ([place])   . Renal insufficiency     Past Surgical History:  Procedure Laterality Date  . ABDOMINAL HYSTERECTOMY  10/27/2000   TAH, BSO  . LEFT AND RIGHT HEART CATHETERIZATION WITH CORONARY ANGIOGRAM Right 04/21/2011   Procedure: LEFT AND RIGHT HEART CATHETERIZATION WITH CORONARY ANGIOGRAM;  Surgeon: [name], MD;  Location: [facility] CATH LAB;  Service: Cardiovascular;  Laterality: Right;  . TONSILLECTOMY      Family History  Problem Relation Age of Onset  . Hypertension Mother   . Heart disease Mother        CHF  . Stroke Mother   . Hyperlipidemia Father   . Stroke Father   . Hypertension Father   . Stroke Maternal Grandmother   . Colon cancer Unknown   . Breast cancer Unknown      Social History  Substance Use Topics  . Smoking status: Never Smoker  . Smokeless tobacco: Never Used  . Alcohol use No    Prior to Admission medications   Medication Sig Start Date End Date Taking? Authorizing Provider  acetaminophen (TYLENOL) 500 MG tablet Take 1,000 mg by mouth every 6 (six) hours as needed for moderate pain.   Yes [provider]  allopurinol (ZYLOPRIM) 100 MG tablet TAKE 1 TABLET EVERY DAY 10/10/15  Yes Posey Boyer, MD  CARTIA XT 240 MG  24 hr capsule Take 240 mg by mouth daily. 10/23/16  Yes [provider]  furosemide (LASIX) 40 MG tablet TAKE 1 TABLET TWICE DAILY 10/10/15  Yes Posey Boyer, MD  hydrALAZINE (APRESOLINE) 50 MG tablet TAKE 1 TABLET THREE TIMES DAILY 10/10/15  Yes Posey Boyer, MD  lisinopril (PRINIVIL,ZESTRIL) 10 MG tablet Take 10 mg by mouth daily.   Yes [provider]  diltiazem (CARTIA XT) 120 MG 24 hr capsule Take 1 capsule (120 mg total) by mouth daily. Patient not taking: Reported on 01/11/2017 04/30/15   Posey Boyer, MD  HYDROcodone-acetaminophen (NORCO) 5-325 MG tablet Take 1 every 4 hours as needed for severe pain only. Patient not taking:  Reported on 01/11/2017 03/28/15   Posey Boyer, MD  lisinopril (PRINIVIL,ZESTRIL) 5 MG tablet Take 1 tablet (5 mg total) by mouth daily. Patient not taking: Reported on 01/11/2017 12/27/15   Tereasa Coop, PA-C  predniSONE (DELTASONE) 20 MG tablet Take 3 a day for 3 days 2 a day for 3 days one a day for 3 days Patient not taking: Reported on 01/11/2017 03/28/15   Posey Boyer, MD  traMADol (ULTRAM) 50 MG tablet Take 1 tablet (50 mg total) by mouth every 6 (six) hours as needed. Patient not taking: Reported on 03/28/2015 11/17/14   Darlyne Russian, MD  traMADol-acetaminophen (ULTRACET) 37.5-325 MG per tablet Take 1 tablet by mouth every 8 (eight) hours as needed for severe pain (Do not take with tramadol.). Patient not taking: Reported on 11/17/2014 09/17/14   Tereasa Coop, PA-C    Current Facility-Administered Medications  Medication Dose Route Frequency Provider Last Rate Last Dose  . 0.9 %  sodium chloride infusion   Intravenous Continuous Lacretia Leigh, MD 125 mL/hr at 01/12/17 1208    . acetaminophen (TYLENOL) tablet 650 mg  650 mg Oral Q6H PRN Vianne Bulls, MD   650 mg at 01/12/17 1202  . hydrALAZINE (APRESOLINE) tablet 50 mg  50 mg Oral TID Vashti Hey, MD   50 mg at 01/12/17 1202  . [START ON 01/13/2017] Levofloxacin (LEVAQUIN) IVPB 250 mg  250 mg Intravenous Q24H Vashti Hey, MD        Allergies as of 01/11/2017 - Review Complete 01/11/2017  Allergen Reaction Noted  . Zithromax [azithromycin] Nausea And Vomiting 04/17/2011     Review of Systems:    Constitutional: No weight loss, fever or chills Skin: No rash  Cardiovascular: No chest pain Respiratory: No SOB  Gastrointestinal: See HPI and otherwise negative Genitourinary: No dysuria  Neurological: No headache Musculoskeletal: No new muscle or joint pain Hematologic: No bruising Psychiatric: No history of depression or anxiety   Physical Exam:  Vital signs in last 24 hours: Temp:  [99 F  (37.2 C)-99.8 F (37.7 C)] 99.6 F (37.6 C) (09/04 0453) Pulse Rate:  [65-84] 76 (09/04 0453) Resp:  [15-18] 15 (09/04 0453) BP: (152-201)/(48-137) 154/48 (09/04 0453) SpO2:  [96 %-100 %] 96 % (09/04 0453) Weight:  [210 lb (95.3 kg)] 210 lb (95.3 kg) (09/03 1421) Last BM Date: 01/11/17 General:   Pleasant AA female appears to be in NAD, Well developed, Well nourished, alert and cooperative Head:  Normocephalic and atraumatic. Eyes:   PEERL, EOMI. No icterus. Conjunctiva pink. Ears:  Normal auditory acuity. Neck:  Supple Throat: Oral cavity and pharynx without inflammation, swelling or lesion. Lungs: Respirations even and unlabored. Lungs clear to auscultation bilaterally.   No wheezes, crackles, or rhonchi.  Heart: Normal  S1, S2. No MRG. Regular rate and rhythm. No peripheral edema, cyanosis or pallor.  Abdomen:  Soft, nondistended, nontender. No rebound or guarding. Normal bowel sounds. No appreciable masses or hepatomegaly. Rectal:  Not performed.  Msk:  Symmetrical without gross deformities. Peripheral pulses intact.  Extremities:  Without edema, no deformity or joint abnormality. Normal ROM, normal sensation. Neurologic:  Alert and  oriented x4;  grossly normal neurologically.  Skin:   Dry and intact without significant lesions or rashes. Psychiatric:  Demonstrates good judgement and reason without abnormal affect or behaviors.   LAB RESULTS:  Recent Labs  01/11/17 0902 01/11/17 1630 01/11/17 2142 01/12/17 0445  WBC 13.8*  --   --   --   HGB 11.5* 11.3* 10.9* 11.5*  HCT 36.3 36.2 33.7* 36.3  PLT 303  --   --   --    BMET  Recent Labs  01/11/17 0902 01/12/17 0445  NA 140 141  K 4.3 4.4  CL 111 114*  CO2 20* 17*  GLUCOSE 112* 97  BUN 46* 32*  CREATININE 2.78* 2.24*  CALCIUM 9.5 9.4   LFT  Recent Labs  01/11/17 0902  PROT 8.3*  ALBUMIN 3.9  AST 16  ALT 10*  ALKPHOS 128*  BILITOT 0.5   STUDIES: Ct Abdomen Pelvis Wo Contrast  Result Date:  01/11/2017 CLINICAL DATA:  Lower abdominal pain since last night. Blood clots in her stool. EXAM: CT ABDOMEN AND PELVIS WITHOUT CONTRAST TECHNIQUE: Multidetector CT imaging of the abdomen and pelvis was performed following the standard protocol without IV contrast. COMPARISON:  Abdomen and pelvis CT dated 10/01/2012 and abdomen ultrasound dated 09/11/2012. FINDINGS: Lower chest: Mild linear atelectasis/ scarring at both lung bases. Hepatobiliary: Poorly distended gallbladder containing interval medium density material. Normal appearing liver. Pancreas: Unremarkable. No pancreatic ductal dilatation or surrounding inflammatory changes. Spleen: Normal in size without focal abnormality. Adrenals/Urinary Tract: Interval increase in size of a left mid upper pole renal cyst, currently measuring 2.0 cm in maximum diameter. Normal appearing right kidney, ureters and urinary bladder. No urinary tract calculi or hydronephrosis seen. Stomach/Bowel: Small to moderate-sized hiatal hernia. Diffuse low density wall thickening involving the distal transverse colon, splenic flexure and proximal descending colon with mild pericolonic soft tissue stranding. Interval mildly dilated loops of jejunum in the left mid and upper abdomen. Normal caliber distal small bowel and colon. Normal appearing appendix. Vascular/Lymphatic: Atheromatous arterial calcifications without aneurysm. No enlarged lymph nodes. Reproductive: Status post hysterectomy. No adnexal masses. Other: Small umbilical hernia containing fat. Musculoskeletal: Lower thoracic spine degenerative changes and mild lumbar spine degenerative changes. IMPRESSION: 1. Segmental colitis involving the distal transverse colon, splenic flexure and proximal descending colon. This could be infectious, inflammatory or ischemic in nature. 2. Mild ileus involving the proximal and mid jejunum due to the adjacent colitis. 3. Small to moderate-sized hiatal hernia. 4. Probable sludge or  noncalcified gallstones in the gallbladder. Electronically Signed   By: Claudie Revering M.D.   On: 01/11/2017 13:41   PREVIOUS ENDOSCOPIES:            None   Impression / Plan:   Impression: 1. Colitis: As seen on CT above, diarrhea and abdominal pain have decreased today, no further hematochezia today, C. difficile negative, GI pathogen panel awaiting collection; question infectious versus viral etiology  Plan: 1. Continue empiric antibiotics 2. Stool pathogen panel is awaiting collection, nursing staff is aware 3. We will allow patient clear liquids today, if she is unable to tolerate then she  can resume nothing by mouth 4. Please await any further recommendations by Dr. Silverio Decamp later today  Thank you for your kind consultation, we will continue to follow.  Lavone Nian Lemmon  01/12/2017, 1:02 PM Pager #: [phone]   Attending physician's note   I have taken a history, examined the patient and reviewed the chart. I agree with the Advanced Practitioner's note, impression and recommendations.  57 yr F admitted with nausea, diarrhea and abdominal discomfort. CT abd & pelvis suggestive of segmental colitis. Differential includes infectious vs ischemic colitis. C.diff negative Continue IV fluids Follow up GI pathogen panel.  Supportive care Patient will need colonoscopy as outpatient in 6-8 weeks, she never had colorectal cancer screening   K Denzil Magnuson, MD [phone] Mon-Fri 8a-5p [phone] after 5p, weekends, holidays

## 2017-01-12 NOTE — Care Management Obs Status (Signed)
[place] NOTIFICATION   Patient Details  Name: Brittany Charles MRN: [id number] Date of Birth: 20-Sep-1950   Medicare Observation Status Notification Given:  Yes    MahabirJuliann Pulse, RN 01/12/2017, 1:43 PM

## 2017-01-12 NOTE — Progress Notes (Addendum)
Patient ID: Brittany Charles, female   DOB: 1951-04-08, 66 y.o.   MRN: [id number]  PROGRESS NOTE    CHEALSEY MIYAMOTO  FWY:[id number] DOB: [birth year] DOA: 01/11/2017  PCP: Hillis Range   Brief Narrative:   66 year old female with past medical history of SLE in remission for several years and not on any immunosuppressants, hypertension, systolic CHF, CKD stage 4 who presented to ED with sudden onset abdominal pain for few hours prior to the admission. Pt also noted having bloody stool, has had approximately 12 stools since the night prior to the admission and 5 stools were bloody. She had CT abdomen which was notable for segmental colitis involving the proximal, transverse and descending colon. She was started on Levaquin. GI will see her in consultation.  Assessment & Plan:   Principal Problem:   Hematochezia / Acute colitis in the proximal, transverse and descending colon / Leukocytosis  - Ct Abdomen Pelvis showed segmental colitis involving the distal transverse colon, splenic flexure and proximal descending colon. This could be infectious, inflammatory or ischemic in nature. Mild ileus involving the proximal and mid jejunum due to the adjacent colitis - Started on Levaquin - GI will the pt in consultation  - GI viral panel is pending  Active Problems:   Hypertension, essential - Continue hydralazine     Lupus (systemic lupus erythematosus) (HCC) - In remission    CKD (chronic kidney disease), stage IV - Cr 2.9 in 2014 - Cr on this admission within baseline range     Chronic systolic CHF ([place]) - ECHO in 2014 showed EF 45-50% - Compensated   DVT prophylaxis: SCD's Code Status: full code  Family Communication: no family at the bedside  Disposition Plan: GI consulted, home once cleared by GI   Consultants:   GI  Procedures:   None   Antimicrobials:   Levaquin 9/3 -->   Subjective: Some mild abd pain this am, has nausea.  Objective: Vitals:   01/11/17 1555 01/11/17 1700 01/11/17 2032 01/12/17 0453  BP: (!) 167/67 (!) 185/56 (!) 152/49 (!) 154/48  Pulse: 74  84 76  Resp: 18  16 15   Temp: 99 F (37.2 C)  99.8 F (37.7 C) 99.6 F (37.6 C)  TempSrc: Oral  Oral Oral  SpO2: 100%  98% 96%  Weight:      Height:        Intake/Output Summary (Last 24 hours) at 01/12/17 1147 Last data filed at 01/12/17 0500  Gross per 24 hour  Intake             1500 ml  Output             1200 ml  Net              300 ml   Filed Weights   01/11/17 1421  Weight: 95.3 kg (210 lb)    Examination:  General exam: Appears calm and comfortable  Respiratory system: Clear to auscultation. Respiratory effort normal. Cardiovascular system: S1 & S2 heard, RRR.  Gastrointestinal system: Abdomen is nondistended, soft and nontender. No organomegaly or masses felt. Normal bowel sounds heard. Central nervous system: Alert and oriented. No focal neurological deficits. Extremities: Symmetric 5 x 5 power. Skin: No rashes, lesions or ulcers Psychiatry: Judgement and insight appear normal. Mood & affect appropriate.   Data Reviewed: I have personally reviewed following labs and imaging studies  CBC:  Recent Labs Lab 01/11/17 0902 01/11/17 1630 01/11/17 2142 01/12/17 0445  WBC 13.8*  --   --   --   HGB 11.5* 11.3* 10.9* 11.5*  HCT 36.3 36.2 33.7* 36.3  MCV 85.4  --   --   --   PLT 303  --   --   --    Basic Metabolic Panel:  Recent Labs Lab 01/11/17 0902 01/12/17 0445  NA 140 141  K 4.3 4.4  CL 111 114*  CO2 20* 17*  GLUCOSE 112* 97  BUN 46* 32*  CREATININE 2.78* 2.24*  CALCIUM 9.5 9.4   GFR: Estimated Creatinine Clearance: 28.6 mL/min (A) (by C-G formula based on SCr of 2.24 mg/dL (H)). Liver Function Tests:  Recent Labs Lab 01/11/17 0902  AST 16  ALT 10*  ALKPHOS 128*  BILITOT 0.5  PROT 8.3*  ALBUMIN 3.9    Recent Labs Lab 01/11/17 0902  LIPASE 18   No results for input(s): AMMONIA in the last 168  hours. Coagulation Profile: No results for input(s): INR, PROTIME in the last 168 hours. Cardiac Enzymes: No results for input(s): CKTOTAL, CKMB, CKMBINDEX, TROPONINI in the last 168 hours. BNP (last 3 results) No results for input(s): PROBNP in the last 8760 hours. HbA1C: No results for input(s): HGBA1C in the last 72 hours. CBG: No results for input(s): GLUCAP in the last 168 hours. Lipid Profile: No results for input(s): CHOL, HDL, LDLCALC, TRIG, CHOLHDL, LDLDIRECT in the last 72 hours. Thyroid Function Tests: No results for input(s): TSH, T4TOTAL, FREET4, T3FREE, THYROIDAB in the last 72 hours. Anemia Panel: No results for input(s): VITAMINB12, FOLATE, FERRITIN, TIBC, IRON, RETICCTPCT in the last 72 hours. Urine analysis:    Component Value Date/Time   COLORURINE STRAW (A) 01/11/2017 1338   APPEARANCEUR CLEAR 01/11/2017 1338   LABSPEC 1.010 01/11/2017 1338   PHURINE 5.0 01/11/2017 1338   GLUCOSEU NEGATIVE 01/11/2017 1338   HGBUR SMALL (A) 01/11/2017 1338   BILIRUBINUR NEGATIVE 01/11/2017 1338   BILIRUBINUR neg 09/12/2014 1612   KETONESUR NEGATIVE 01/11/2017 1338   PROTEINUR 100 (A) 01/11/2017 1338   UROBILINOGEN 0.2 09/12/2014 1612   UROBILINOGEN 0.2 10/01/2012 1845   NITRITE NEGATIVE 01/11/2017 1338   LEUKOCYTESUR NEGATIVE 01/11/2017 1338   Sepsis Labs: @LABRCNTIP (procalcitonin:4,lacticidven:4)   )No results found for this or any previous visit (from the past 240 hour(s)).    Radiology Studies: Ct Abdomen Pelvis Wo Contrast Result Date: 01/11/2017 1. Segmental colitis involving the distal transverse colon, splenic flexure and proximal descending colon. This could be infectious, inflammatory or ischemic in nature. 2. Mild ileus involving the proximal and mid jejunum due to the adjacent colitis. 3. Small to moderate-sized hiatal hernia. 4. Probable sludge or noncalcified gallstones in the gallbladder.    Scheduled Meds: . hydrALAZINE  50 mg Oral TID  . pneumococcal  23 valent vaccine  0.5 mL Intramuscular Tomorrow-1000   Continuous Infusions: . sodium chloride 125 mL/hr at 01/11/17 1739  . [START ON 01/13/2017] levofloxacin (LEVAQUIN) IV       LOS: 0 days    Time spent: 25 minutes  Greater than 50% of the time spent on counseling and coordinating the care.   Leisa Lenz, MD [facility] Pager [phone]  If 7PM-7AM, please contact night-coverage [email or web address] Password [email or web address] 01/12/2017, 11:47 AM

## 2017-01-13 ENCOUNTER — Telehealth: Payer: Self-pay

## 2017-01-13 DIAGNOSIS — I5022 Chronic systolic (congestive) heart failure: Secondary | ICD-10-CM

## 2017-01-13 DIAGNOSIS — M10361 Gout due to renal impairment, right knee: Secondary | ICD-10-CM

## 2017-01-13 DIAGNOSIS — K529 Noninfective gastroenteritis and colitis, unspecified: Secondary | ICD-10-CM

## 2017-01-13 DIAGNOSIS — N183 Chronic kidney disease, stage 3 (moderate): Secondary | ICD-10-CM

## 2017-01-13 LAB — BASIC METABOLIC PANEL
Anion gap: 8 (ref 5–15)
BUN: 22 mg/dL — ABNORMAL HIGH (ref 6–20)
CALCIUM: 9 mg/dL (ref 8.9–10.3)
CO2: 16 mmol/L — ABNORMAL LOW (ref 22–32)
CREATININE: 2.19 mg/dL — AB (ref 0.44–1.00)
Chloride: 115 mmol/L — ABNORMAL HIGH (ref 101–111)
GFR, EST AFRICAN AMERICAN: 26 mL/min — AB (ref >60)
GFR, EST NON AFRICAN AMERICAN: 22 mL/min — AB (ref >60)
Glucose, Bld: 101 mg/dL — ABNORMAL HIGH (ref 65–99)
Potassium: 5.2 mmol/L — ABNORMAL HIGH (ref 3.5–5.1)
SODIUM: 139 mmol/L (ref 135–145)

## 2017-01-13 LAB — CBC
HCT: 32.2 % — ABNORMAL LOW (ref 36.0–46.0)
Hemoglobin: 10 g/dL — ABNORMAL LOW (ref 12.0–15.0)
MCH: 26.7 pg (ref 26.0–34.0)
MCHC: 31.1 g/dL (ref 30.0–36.0)
MCV: 86.1 fL (ref 78.0–100.0)
PLATELETS: 283 10*3/uL (ref 150–400)
RBC: 3.74 MIL/uL — AB (ref 3.87–5.11)
RDW: 16 % — AB (ref 11.5–15.5)
WBC: 13.5 10*3/uL — AB (ref 4.0–10.5)

## 2017-01-13 MED ORDER — HYDROCODONE-ACETAMINOPHEN 5-325 MG PO TABS
1.0000 | ORAL_TABLET | ORAL | Status: DC | PRN
  Administered 2017-01-13 (×2): 1 via ORAL
  Filled 2017-01-13: qty 2
  Filled 2017-01-13: qty 1

## 2017-01-13 MED ORDER — COLCHICINE 0.6 MG PO TABS
0.6000 mg | ORAL_TABLET | Freq: Once | ORAL | Status: AC
  Administered 2017-01-13: 0.6 mg via ORAL
  Filled 2017-01-13: qty 1

## 2017-01-13 MED ORDER — LEVOFLOXACIN 500 MG PO TABS
250.0000 mg | ORAL_TABLET | Freq: Every day | ORAL | Status: DC
  Administered 2017-01-14: 250 mg via ORAL
  Filled 2017-01-13: qty 1

## 2017-01-13 NOTE — Progress Notes (Signed)
Diet was advanced to soft. Patient has tolerated well. Pt denies N/V. Will continue to monitor.

## 2017-01-13 NOTE — Telephone Encounter (Signed)
The pt has been scheduled for previsit and colon and will be given information at discharge.

## 2017-01-13 NOTE — Telephone Encounter (Signed)
-----   Message from Levin Erp, [place] sent at 01/13/2017 10:03 AM EDT ----- Regarding: Arrange colonoscopy Pt needs colonoscopy with Dr. Silverio Decamp in 6-8 weeks in [facility] for colorectal cancer screening.  Pt will be discharged today or tomorrow from [facility].  Thanks-JLL

## 2017-01-13 NOTE — Progress Notes (Signed)
Progress Note   Subjective  Chief Complaint: hematochezia, diarrhea  Patient tells me she had 2 loose stools yesterday and one this morning, no further bleeding. She has had no further nausea. Overall she feels much better and has just ordered a soft diet for breakfast.    Objective   Vital signs in last 24 hours: Temp:  [98.9 F (37.2 C)-100.1 F (37.8 C)] 98.9 F (37.2 C) (09/05 0526) Pulse Rate:  [75-81] 81 (09/05 0526) Resp:  [16-18] 18 (09/05 0526) BP: (165-179)/(58-63) 179/63 (09/05 0526) SpO2:  [98 %-99 %] 99 % (09/05 0526) Last BM Date: 01/11/17 General:    AA female in NAD Heart:  Regular rate and rhythm; no murmurs Lungs: Respirations even and unlabored, lungs CTA bilaterally Abdomen:  Soft, nontender and nondistended. Normal bowel sounds. Extremities:  Without edema. Neurologic:  Alert and oriented,  grossly normal neurologically. Psych:  Cooperative. Normal mood and affect.  Intake/Output from previous day: 09/04 0701 - 09/05 0700 In: 3179.2 [I.V.:3179.2] Out: 1175 [Urine:1175]  Lab Results:  Recent Labs  01/11/17 0902  01/12/17 0445 01/12/17 1548 01/13/17 0722  WBC 13.8*  --   --   --  13.5*  HGB 11.5*  < > 11.5* 11.0* 10.0*  HCT 36.3  < > 36.3 35.1* 32.2*  PLT 303  --   --   --  283  < > = values in this interval not displayed. BMET  Recent Labs  01/11/17 0902 01/12/17 0445 01/13/17 0535  NA 140 141 139  K 4.3 4.4 5.2*  CL 111 114* 115*  CO2 20* 17* 16*  GLUCOSE 112* 97 101*  BUN 46* 32* 22*  CREATININE 2.78* 2.24* 2.19*  CALCIUM 9.5 9.4 9.0   LFT  Recent Labs  01/11/17 0902  PROT 8.3*  ALBUMIN 3.9  AST 16  ALT 10*  ALKPHOS 128*  BILITOT 0.5   Studies/Results: Ct Abdomen Pelvis Wo Contrast  Result Date: 01/11/2017 CLINICAL DATA:  Lower abdominal pain since last night. Blood clots in her stool. EXAM: CT ABDOMEN AND PELVIS WITHOUT CONTRAST TECHNIQUE: Multidetector CT imaging of the abdomen and pelvis was performed following  the standard protocol without IV contrast. COMPARISON:  Abdomen and pelvis CT dated 10/01/2012 and abdomen ultrasound dated 09/11/2012. FINDINGS: Lower chest: Mild linear atelectasis/ scarring at both lung bases. Hepatobiliary: Poorly distended gallbladder containing interval medium density material. Normal appearing liver. Pancreas: Unremarkable. No pancreatic ductal dilatation or surrounding inflammatory changes. Spleen: Normal in size without focal abnormality. Adrenals/Urinary Tract: Interval increase in size of a left mid upper pole renal cyst, currently measuring 2.0 cm in maximum diameter. Normal appearing right kidney, ureters and urinary bladder. No urinary tract calculi or hydronephrosis seen. Stomach/Bowel: Small to moderate-sized hiatal hernia. Diffuse low density wall thickening involving the distal transverse colon, splenic flexure and proximal descending colon with mild pericolonic soft tissue stranding. Interval mildly dilated loops of jejunum in the left mid and upper abdomen. Normal caliber distal small bowel and colon. Normal appearing appendix. Vascular/Lymphatic: Atheromatous arterial calcifications without aneurysm. No enlarged lymph nodes. Reproductive: Status post hysterectomy. No adnexal masses. Other: Small umbilical hernia containing fat. Musculoskeletal: Lower thoracic spine degenerative changes and mild lumbar spine degenerative changes. IMPRESSION: 1. Segmental colitis involving the distal transverse colon, splenic flexure and proximal descending colon. This could be infectious, inflammatory or ischemic in nature. 2. Mild ileus involving the proximal and mid jejunum due to the adjacent colitis. 3. Small to moderate-sized hiatal hernia. 4. Probable sludge or noncalcified  gallstones in the gallbladder. Electronically Signed   By: Claudie Revering M.D.   On: 01/11/2017 13:41       Assessment / Plan:   Assessment: 1. Colitis: Patient admitted with nausea, diarrhea and abdominal  discomfort, CT abdomen and pelvis suggests segmental colitis, patient's symptoms are now improving, GI pathogen panel pending, C. Difficile negative; consider infectious versus ischemic colitis  Plan: 1. Continue IV fluids 2. Follow-up GI pathogen panel 3. Patient will need colonoscopy as outpatient in 6-8 weeks for colorectal cancer screening 4. Continue to advance diet as tolerated 5. We will likely sign off today, please await any further recommendations Dr. Silverio Decamp later today  Thank you for your kind consultation.  Discharge Planning Diet: Advance as tolerated Anticoagulation and antiplatelets:N/A Discharge Medications: No new Follow up: Pt will be called by our office to schedule colonoscopy for colon cancer screening  Procedure Procedures planned and timing of procedure: outpatient colo in 6-8 weeks Lab work ordered: Gi pathogen panel pending Hold the following anticoagulation and/or antiplatelets for procedure? N/A   LOS: 1 day   Levin Erp  01/13/2017, 9:58 AM  Pager # [phone]   Attending physician's note   I have taken an interval history, reviewed the chart and examined the patient. I agree with the Advanced Practitioner's note, impression and recommendations.  Feels better. No further diarrhea.  Advance diet as tolerated Continue antibiotics for a short course of 5 days Ok to discharge home from GI perspective Follow up as outpatient for colonoscopy, past due for colorectal cancer screening   K Denzil Magnuson, MD [phone] Mon-Fri 8a-5p [phone] after 5p, weekends, holidays

## 2017-01-13 NOTE — Plan of Care (Signed)
Problem: Nutrition: Goal: Adequate nutrition will be maintained Outcome: Completed/Met Date Met: 01/13/17 Diet was advanced today. Pt tolerated well thus far. Will continue to monitor

## 2017-01-13 NOTE — Progress Notes (Signed)
Patient ID: Brittany Charles, female   DOB: [birth year], 66 y.o.   MRN: [id number]  PROGRESS NOTE    JAEDYN MARRUFO  CVE:[id number] DOB: 10-19-1950 DOA: 01/11/2017  PCP: Hillis Range   Brief Narrative:   66 year old female with past medical history of SLE in remission for several years and not on any immunosuppressants, hypertension, systolic CHF, CKD stage 4 who presented to ED with sudden onset abdominal pain for few hours prior to the admission. Pt also noted having bloody stool, has had approximately 12 stools since the night prior to the admission and 5 stools were bloody. She had CT abdomen which was notable for segmental colitis involving the proximal, transverse and descending colon. She was started on Levaquin. GI will see her in consultation.  Assessment & Plan:   Acute colitis -infectious vs ischemic -Ct Abdomen Pelvis showed segmental colitis involving the distal transverse colon, splenic flexure and proximal descending colon -improving with supportive care, also on Levaquin will change to PO, since clinically better will not add metronidazole now -appreciate GI input -GI pathogen panel pending -plan for screening colonoscopy as outpatient  L knee Gout flare -she blames this on decreased dose of Allopurinol -resumed at 100mg  daily, add single dose Colchicine -no NSAIDs due to CKD4 -may need prednisone if no improvement -PT consult    Hypertension, essential - Continue hydralazine     Lupus (systemic lupus erythematosus) (HCC) - In remission    CKD (chronic kidney disease), stage IV - Cr 2.9 in 2014 - Cr on this admission within baseline range   -followed by Dr.Powell -Stop Lisinopril    Chronic systolic CHF ([place]) - ECHO in 2014 showed EF 45-50% - Compensated   DVT prophylaxis: SCD's Code Status: full code  Family Communication: no family at the bedside  Disposition Plan: home tomorrow if improving   Consultants:   GI  Procedures:   None    Antimicrobials:   Levaquin 9/3 -->   Subjective: Diarrhea much better, tol liquids, R knee swollen and tender-feels like gout flare  Objective: Vitals:   01/12/17 1353 01/12/17 2123 01/13/17 0020 01/13/17 0526  BP: (!) 165/58 (!) 172/58  (!) 179/63  Pulse: 75 79  81  Resp: 16 18  18   Temp: 99.2 F (37.3 C) 100.1 F (37.8 C) 99.4 F (37.4 C) 98.9 F (37.2 C)  TempSrc: Oral Oral Oral Oral  SpO2: 99% 98%  99%  Weight:      Height:        Intake/Output Summary (Last 24 hours) at 01/13/17 1330 Last data filed at 01/13/17 1136  Gross per 24 hour  Intake          3179.17 ml  Output             1175 ml  Net          2004.17 ml   Filed Weights   01/11/17 1421  Weight: 95.3 kg (210 lb)    Examination:  Gen: Awake, Alert, Oriented X 3, no distress HEENT: PERRLA, Neck supple, no JVD Lungs: Good air movement bilaterally, CTAB CVS: RRR,No Gallops,Rubs or new Murmurs Abd: soft, Non tender, non distended, BS present Extremities: R knee with swelling and decreased RoM Skin: no new rashes  Data Reviewed: I have personally reviewed following labs and imaging studies  CBC:  Recent Labs Lab 01/11/17 0902 01/11/17 1630 01/11/17 2142 01/12/17 0445 01/12/17 1548 01/13/17 0722  WBC 13.8*  --   --   --   --  13.5*  HGB 11.5* 11.3* 10.9* 11.5* 11.0* 10.0*  HCT 36.3 36.2 33.7* 36.3 35.1* 32.2*  MCV 85.4  --   --   --   --  86.1  PLT 303  --   --   --   --  892   Basic Metabolic Panel:  Recent Labs Lab 01/11/17 0902 01/12/17 0445 01/13/17 0535  NA 140 141 139  K 4.3 4.4 5.2*  CL 111 114* 115*  CO2 20* 17* 16*  GLUCOSE 112* 97 101*  BUN 46* 32* 22*  CREATININE 2.78* 2.24* 2.19*  CALCIUM 9.5 9.4 9.0   GFR: Estimated Creatinine Clearance: 29.2 mL/min (A) (by C-G formula based on SCr of 2.19 mg/dL (H)). Liver Function Tests:  Recent Labs Lab 01/11/17 0902  AST 16  ALT 10*  ALKPHOS 128*  BILITOT 0.5  PROT 8.3*  ALBUMIN 3.9    Recent Labs Lab  01/11/17 0902  LIPASE 18   No results for input(s): AMMONIA in the last 168 hours. Coagulation Profile: No results for input(s): INR, PROTIME in the last 168 hours. Cardiac Enzymes: No results for input(s): CKTOTAL, CKMB, CKMBINDEX, TROPONINI in the last 168 hours. BNP (last 3 results) No results for input(s): PROBNP in the last 8760 hours. HbA1C: No results for input(s): HGBA1C in the last 72 hours. CBG: No results for input(s): GLUCAP in the last 168 hours. Lipid Profile: No results for input(s): CHOL, HDL, LDLCALC, TRIG, CHOLHDL, LDLDIRECT in the last 72 hours. Thyroid Function Tests: No results for input(s): TSH, T4TOTAL, FREET4, T3FREE, THYROIDAB in the last 72 hours. Anemia Panel: No results for input(s): VITAMINB12, FOLATE, FERRITIN, TIBC, IRON, RETICCTPCT in the last 72 hours. Urine analysis:    Component Value Date/Time   COLORURINE STRAW (A) 01/11/2017 1338   APPEARANCEUR CLEAR 01/11/2017 1338   LABSPEC 1.010 01/11/2017 1338   PHURINE 5.0 01/11/2017 1338   GLUCOSEU NEGATIVE 01/11/2017 1338   HGBUR SMALL (A) 01/11/2017 1338   BILIRUBINUR NEGATIVE 01/11/2017 1338   BILIRUBINUR neg 09/12/2014 1612   KETONESUR NEGATIVE 01/11/2017 1338   PROTEINUR 100 (A) 01/11/2017 1338   UROBILINOGEN 0.2 09/12/2014 1612   UROBILINOGEN 0.2 10/01/2012 1845   NITRITE NEGATIVE 01/11/2017 1338   LEUKOCYTESUR NEGATIVE 01/11/2017 1338   Sepsis Labs: @LABRCNTIP (procalcitonin:4,lacticidven:4)   )No results found for this or any previous visit (from the past 240 hour(s)).    Radiology Studies: Ct Abdomen Pelvis Wo Contrast Result Date: 01/11/2017 1. Segmental colitis involving the distal transverse colon, splenic flexure and proximal descending colon. This could be infectious, inflammatory or ischemic in nature. 2. Mild ileus involving the proximal and mid jejunum due to the adjacent colitis. 3. Small to moderate-sized hiatal hernia. 4. Probable sludge or noncalcified gallstones in the  gallbladder.    Scheduled Meds: . allopurinol  100 mg Oral Daily  . hydrALAZINE  50 mg Oral TID  . [START ON 01/14/2017] levofloxacin  250 mg Oral Daily  . ondansetron  4 mg Oral Q12H   Continuous Infusions: . sodium chloride 10 mL/hr at 01/13/17 1053     LOS: 1 day    Time spent: 25 minutes  Greater than 50% of the time spent on counseling and coordinating the care.   Domenic Polite, MD [facility] Pager [phone]  If 7PM-7AM, please contact night-coverage [email or web address] Password [email or web address] 01/13/2017, 1:30 PM

## 2017-01-13 NOTE — Plan of Care (Signed)
Problem: Skin Integrity: Goal: Risk for impaired skin integrity will decrease Outcome: Completed/Met Date Met: 01/13/17 Skin remains intact. Pt is able to mobilize self

## 2017-01-14 DIAGNOSIS — Z23 Encounter for immunization: Secondary | ICD-10-CM | POA: Diagnosis not present

## 2017-01-14 LAB — BASIC METABOLIC PANEL
ANION GAP: 5 (ref 5–15)
BUN: 20 mg/dL (ref 6–20)
CALCIUM: 9.2 mg/dL (ref 8.9–10.3)
CO2: 22 mmol/L (ref 22–32)
Chloride: 114 mmol/L — ABNORMAL HIGH (ref 101–111)
Creatinine, Ser: 1.99 mg/dL — ABNORMAL HIGH (ref 0.44–1.00)
GFR calc Af Amer: 29 mL/min — ABNORMAL LOW (ref >60)
GFR, EST NON AFRICAN AMERICAN: 25 mL/min — AB (ref >60)
GLUCOSE: 108 mg/dL — AB (ref 65–99)
Potassium: 4.1 mmol/L (ref 3.5–5.1)
SODIUM: 141 mmol/L (ref 135–145)

## 2017-01-14 LAB — CBC
HCT: 32.1 % — ABNORMAL LOW (ref 36.0–46.0)
Hemoglobin: 10.1 g/dL — ABNORMAL LOW (ref 12.0–15.0)
MCH: 26.7 pg (ref 26.0–34.0)
MCHC: 31.5 g/dL (ref 30.0–36.0)
MCV: 84.9 fL (ref 78.0–100.0)
PLATELETS: 279 10*3/uL (ref 150–400)
RBC: 3.78 MIL/uL — ABNORMAL LOW (ref 3.87–5.11)
RDW: 15.8 % — AB (ref 11.5–15.5)
WBC: 11.4 10*3/uL — AB (ref 4.0–10.5)

## 2017-01-14 MED ORDER — LEVOFLOXACIN 250 MG PO TABS: 250.0000 mg | ORAL_TABLET | Freq: Every day | ORAL | 0 refills | Status: DC

## 2017-01-14 MED ORDER — DILTIAZEM HCL ER COATED BEADS 120 MG PO CP24
120.0000 mg | ORAL_CAPSULE | Freq: Every day | ORAL | Status: DC
  Administered 2017-01-14: 120 mg via ORAL
  Filled 2017-01-14: qty 1

## 2017-01-14 MED ORDER — PREDNISONE 50 MG PO TABS
50.0000 mg | ORAL_TABLET | Freq: Every day | ORAL | Status: DC
  Administered 2017-01-14: 50 mg via ORAL
  Filled 2017-01-14: qty 1

## 2017-01-14 MED ORDER — PREDNISONE 20 MG PO TABS: 20.0000 mg | ORAL_TABLET | Freq: Every day | ORAL | 0 refills | Status: DC

## 2017-01-14 NOTE — Progress Notes (Signed)
    Progress Note   Subjective  Chief Complaint: Hematochezia, diarrhea   Patient had 1 loose stool this morning and 1 loose stool yesterday, no further bleeding. She denies any further abdominal pain. She has been on a regular diet and is doing well. She is prepaired to go home today.   Objective   Vital signs in last 24 hours: Temp:  [99.2 F (37.3 C)-100.2 F (37.9 C)] 99.2 F (37.3 C) (09/06 0348) Pulse Rate:  [75-90] 78 (09/06 0348) Resp:  [16] 16 (09/06 0348) BP: (153-200)/(49-79) 166/79 (09/06 0856) SpO2:  [95 %-100 %] 100 % (09/06 0348) Last BM Date: 01/13/17 General:    AA female in NAD Heart:  Regular rate and rhythm; no murmurs Lungs: Respirations even and unlabored, lungs CTA bilaterally Abdomen:  Soft, nontender and nondistended. Normal bowel sounds. Extremities:  Without edema. Neurologic:  Alert and oriented,  grossly normal neurologically. Psych:  Cooperative. Normal mood and affect.  Intake/Output from previous day: 09/05 0701 - 09/06 0700 In: 700 [I.V.:700] Out: 1300 [Urine:1300]  Lab Results:  Recent Labs  01/12/17 1548 01/13/17 0722 01/14/17 0459  WBC  --  13.5* 11.4*  HGB 11.0* 10.0* 10.1*  HCT 35.1* 32.2* 32.1*  PLT  --  283 279   BMET  Recent Labs  01/12/17 0445 01/13/17 0535 01/14/17 0459  NA 141 139 141  K 4.4 5.2* 4.1  CL 114* 115* 114*  CO2 17* 16* 22  GLUCOSE 97 101* 108*  BUN 32* 22* 20  CREATININE 2.24* 2.19* 1.99*  CALCIUM 9.4 9.0 9.2    Assessment / Plan:   Assessment: 1. Colitis: Patient admitted with nausea, diarrhea and abdominal discomfort, CT abdomen pelvis suggested segmental colitis, patient's symptoms have now improved, C. difficile was negative, GI pathogen panel was never collected; Suspect likely viral gastroenteritis vs food poisoning  Plan: 1. OK for discharge today from Gi perspective 2. Patient is scheduled for outpatient colo with Dr. Silverio Decamp 3. No need for abx at discharge 4. Please await any final  recommendations from Dr. Silverio Decamp, we will sign off today  Discharge Planning Diet:Regular Anticoagulation and antiplatelets:N/A Discharge Medications: None Follow up: For outpatient colo-scheduled 03/09/17  Procedure Procedures planned and timing of procedure: outpatient as above Lab work ordered: Gi path panel pending collection, can d/c Hold the following anticoagulation and/or antiplatelets for procedure? N/A  Thank you for your kind consultation, we will sign off.   LOS: 2 days   Levin Erp  01/14/2017, 10:21 AM  Pager # [phone]   Attending physician's note   I have taken an interval history, reviewed the chart and examined the patient. I agree with the Advanced Practitioner's note, impression and recommendations.   Damaris Hippo, MD [phone] Mon-Fri 8a-5p [phone] after 5p, weekends, holidays

## 2017-01-14 NOTE — Progress Notes (Signed)
GI provider request enteric precautions be discontinued.

## 2017-01-14 NOTE — Care Management Note (Signed)
Case Management Note  Patient Details  Name: Brittany Charles MRN: [id number] Date of Birth: 12/02/1950  Subjective/Objective: Received call from attending for a pcp for patient. Provided patient w/pcp listing in [facility]-patient will make own appt. Patient voiced understanding. No further CM needs.                   Action/Plan:d/c home.   Expected Discharge Date:  01/14/17               Expected Discharge Plan:  Home/Self Care  In-House Referral:     Discharge planning Services  CM Consult, Other - See comment (PCP)  Post Acute Care Choice:    Choice offered to:     DME Arranged:    DME Agency:     HH Arranged:    HH Agency:     Status of Service:  Completed, signed off  If discussed at [employer] of Stay Meetings, dates discussed:    Additional Comments:  Dessa Phi, RN 01/14/2017, 4:19 PM

## 2017-01-14 NOTE — Discharge Summary (Signed)
Physician Discharge Summary  Brittany Charles DPO:[id number] DOB: 06-Dec-1950 DOA: 01/11/2017  PCP: Tereasa Coop, PA-C  Admit date: 01/11/2017 Discharge date: 01/14/2017  Time spent: 35 minutes  Recommendations for Outpatient Follow-up:  1. PCP in 1 week 2. [facility] in 22month 3. Renal Dr.Powell in 1 week   Discharge Diagnoses:  Principal Problem:   Hematochezia   Colitis   CKD 4   Chronic systolic CHF   Hypertension   Gout   Lupus (systemic lupus erythematosus) (HCC)   CKD (chronic kidney disease), stage III   Systolic CHF ([place])   Colitis   Discharge Condition: stable  Diet recommendation: low sodium, heart healthy  Filed Weights   01/11/17 1421  Weight: 95.3 kg (210 lb)    History of present illness:  66 year old female with past medical history of SLE in remission for several years and not on any immunosuppressants, hypertension, systolic CHF, CKD stage 4 who presented to ED with sudden onset abdominal pain for few hours prior to the admission. Pt also noted having bloody stool, has had approximately 12 stools since the night prior to the admission   Hospital Course:    Acute colitis -infectious vs ischemic -Ct Abdomen Pelvis showed segmental colitis involving the distal transverse colon, splenic flexure and proximal descending colon -improved with supportive care, bowel rest, Levaquin -seen by  GI -GI pathogen panel couldn't be sent since diarrhea resolved -much improved, -GI recommended screening colonoscopy as outpatient and no Abx at discharge  L knee Gout flare -she blames this on decreased dose of Allopurinol -resumed at 100mg  daily, given single dose Colchicine, finally given prednisone today with improvement -no NSAIDs due to CKD4 -now improved, Dced home on prednisone taper    Hypertension, essential - Continue hydralazine, lasix    Lupus (systemic lupus erythematosus) ([place]) - In remission, advised FU with Rheumatology    CKD (chronic  kidney disease), stage IV - Cr 2.9 in 2014 - Cr on this admission within baseline range   -followed by Dr.Powell -Stopped Lisinopril    Chronic systolic CHF ([place]) - ECHO in 2014 showed EF 45-50% - Compensated , resumed Po lasix, ACE stopped due to CKD4  Consultations:  [facility]  Discharge Exam: Vitals:   01/14/17 0856 01/14/17 1310  BP: (!) 166/79 (!) 178/80  Pulse:  84  Resp:  16  Temp:  98.6 F (37 C)  SpO2:  98%    General: AAOx3 Cardiovascular: S!S2/RRR Respiratory: CTAB  Discharge Instructions   Discharge Instructions    Diet - low sodium heart healthy    Complete by:  As directed    Diet - low sodium heart healthy    Complete by:  As directed    Increase activity slowly    Complete by:  As directed    Increase activity slowly    Complete by:  As directed      Current Discharge Medication List    CONTINUE these medications which have CHANGED   Details  predniSONE (DELTASONE) 20 MG tablet Take 1-2 tablets (20-40 mg total) by mouth daily with breakfast. Take 40mg  for 2days then 20mg  for 3days then STOP Qty: 10 tablet, Refills: 0   Associated Diagnoses: Systemic lupus ([place]); Acute gout due to renal impairment involving right wrist      CONTINUE these medications which have NOT CHANGED   Details  acetaminophen (TYLENOL) 500 MG tablet Take 1,000 mg by mouth every 6 (six) hours as needed for moderate pain.  allopurinol (ZYLOPRIM) 100 MG tablet TAKE 1 TABLET EVERY DAY Qty: 90 tablet, Refills: 0    furosemide (LASIX) 40 MG tablet TAKE 1 TABLET TWICE DAILY Qty: 180 tablet, Refills: 0    hydrALAZINE (APRESOLINE) 50 MG tablet TAKE 1 TABLET THREE TIMES DAILY Qty: 270 tablet, Refills: 0    diltiazem (CARTIA XT) 120 MG 24 hr capsule Take 1 capsule (120 mg total) by mouth daily. Qty: 90 capsule, Refills: 1   Associated Diagnoses: CKD (chronic kidney disease) stage 3, GFR 30-59 ml/min; Essential hypertension; Acute gout due to renal impairment involving right  wrist    HYDROcodone-acetaminophen (NORCO) 5-325 MG tablet Take 1 every 4 hours as needed for severe pain only. Qty: 15 tablet, Refills: 0   Associated Diagnoses: Acute gout due to renal impairment involving right wrist      STOP taking these medications     lisinopril (PRINIVIL,ZESTRIL) 10 MG tablet      lisinopril (PRINIVIL,ZESTRIL) 5 MG tablet      traMADol (ULTRAM) 50 MG tablet      traMADol-acetaminophen (ULTRACET) 37.5-325 MG per tablet        Allergies  Allergen Reactions  . Zithromax [Azithromycin] Nausea And Vomiting   Follow-up Information    Mauri Pole, MD Follow up in 1 month(s).   Specialty:  Gastroenterology Why:  Office will call you Contact information: [place] [address] [phone]        Estanislado Emms, MD. Schedule an appointment as soon as possible for a visit in 1 month(s).   Specialty:  Nephrology Contact information: [place] [address] [phone]            The results of significant diagnostics from this hospitalization (including imaging, microbiology, ancillary and laboratory) are listed below for reference.    Significant Diagnostic Studies: Ct Abdomen Pelvis Wo Contrast  Result Date: 01/11/2017 CLINICAL DATA:  Lower abdominal pain since last night. Blood clots in her stool. EXAM: CT ABDOMEN AND PELVIS WITHOUT CONTRAST TECHNIQUE: Multidetector CT imaging of the abdomen and pelvis was performed following the standard protocol without IV contrast. COMPARISON:  Abdomen and pelvis CT dated 10/01/2012 and abdomen ultrasound dated 09/11/2012. FINDINGS: Lower chest: Mild linear atelectasis/ scarring at both lung bases. Hepatobiliary: Poorly distended gallbladder containing interval medium density material. Normal appearing liver. Pancreas: Unremarkable. No pancreatic ductal dilatation or surrounding inflammatory changes. Spleen: Normal in size without focal  abnormality. Adrenals/Urinary Tract: Interval increase in size of a left mid upper pole renal cyst, currently measuring 2.0 cm in maximum diameter. Normal appearing right kidney, ureters and urinary bladder. No urinary tract calculi or hydronephrosis seen. Stomach/Bowel: Small to moderate-sized hiatal hernia. Diffuse low density wall thickening involving the distal transverse colon, splenic flexure and proximal descending colon with mild pericolonic soft tissue stranding. Interval mildly dilated loops of jejunum in the left mid and upper abdomen. Normal caliber distal small bowel and colon. Normal appearing appendix. Vascular/Lymphatic: Atheromatous arterial calcifications without aneurysm. No enlarged lymph nodes. Reproductive: Status post hysterectomy. No adnexal masses. Other: Small umbilical hernia containing fat. Musculoskeletal: Lower thoracic spine degenerative changes and mild lumbar spine degenerative changes. IMPRESSION: 1. Segmental colitis involving the distal transverse colon, splenic flexure and proximal descending colon. This could be infectious, inflammatory or ischemic in nature. 2. Mild ileus involving the proximal and mid jejunum due  to the adjacent colitis. 3. Small to moderate-sized hiatal hernia. 4. Probable sludge or noncalcified gallstones in the gallbladder. Electronically Signed   By: Claudie Revering M.D.   On: 01/11/2017 13:41    Microbiology: No results found for this or any previous visit (from the past 240 hour(s)).   Labs: Basic Metabolic Panel:  Recent Labs Lab 01/11/17 0902 01/12/17 0445 01/13/17 0535 01/14/17 0459  NA 140 141 139 141  K 4.3 4.4 5.2* 4.1  CL 111 114* 115* 114*  CO2 20* 17* 16* 22  GLUCOSE 112* 97 101* 108*  BUN 46* 32* 22* 20  CREATININE 2.78* 2.24* 2.19* 1.99*  CALCIUM 9.5 9.4 9.0 9.2   Liver Function Tests:  Recent Labs Lab 01/11/17 0902  AST 16  ALT 10*  ALKPHOS 128*  BILITOT 0.5  PROT 8.3*  ALBUMIN 3.9    Recent Labs Lab  01/11/17 0902  LIPASE 18   No results for input(s): AMMONIA in the last 168 hours. CBC:  Recent Labs Lab 01/11/17 0902  01/11/17 2142 01/12/17 0445 01/12/17 1548 01/13/17 0722 01/14/17 0459  WBC 13.8*  --   --   --   --  13.5* 11.4*  HGB 11.5*  < > 10.9* 11.5* 11.0* 10.0* 10.1*  HCT 36.3  < > 33.7* 36.3 35.1* 32.2* 32.1*  MCV 85.4  --   --   --   --  86.1 84.9  PLT 303  --   --   --   --  283 279  < > = values in this interval not displayed. Cardiac Enzymes: No results for input(s): CKTOTAL, CKMB, CKMBINDEX, TROPONINI in the last 168 hours. BNP: BNP (last 3 results) No results for input(s): BNP in the last 8760 hours.  ProBNP (last 3 results) No results for input(s): PROBNP in the last 8760 hours.  CBG: No results for input(s): GLUCAP in the last 168 hours.     SignedDomenic Polite MD.  [facility] 01/14/2017, 3:29 PM

## 2017-01-14 NOTE — Discharge Instructions (Signed)

## 2017-03-02 ENCOUNTER — Encounter: Payer: Self-pay | Admitting: Gastroenterology

## 2017-03-02 ENCOUNTER — Ambulatory Visit (AMBULATORY_SURGERY_CENTER): Payer: Self-pay

## 2017-03-02 VITALS — Ht 65.0 in | Wt 185.0 lb

## 2017-03-02 DIAGNOSIS — Z1211 Encounter for screening for malignant neoplasm of colon: Secondary | ICD-10-CM

## 2017-03-02 MED ORDER — SUPREP BOWEL PREP KIT 17.5-3.13-1.6 GM/177ML PO SOLN: 1.0000 | Freq: Once | ORAL | 0 refills | Status: AC

## 2017-03-02 NOTE — Progress Notes (Signed)
No diet meds No allergies to eggs or soy No home oxygen No past problems with anesthesia  Declined emmi

## 2017-03-09 ENCOUNTER — Ambulatory Visit (AMBULATORY_SURGERY_CENTER): Payer: Medicare HMO | Admitting: Gastroenterology

## 2017-03-09 ENCOUNTER — Encounter: Payer: Self-pay | Admitting: Gastroenterology

## 2017-03-09 VITALS — BP 154/71 | HR 59 | Temp 98.0°F | Resp 15 | Ht 65.0 in | Wt 185.0 lb

## 2017-03-09 DIAGNOSIS — D124 Benign neoplasm of descending colon: Secondary | ICD-10-CM | POA: Diagnosis not present

## 2017-03-09 DIAGNOSIS — D123 Benign neoplasm of transverse colon: Secondary | ICD-10-CM | POA: Diagnosis not present

## 2017-03-09 DIAGNOSIS — Z1212 Encounter for screening for malignant neoplasm of rectum: Secondary | ICD-10-CM | POA: Diagnosis not present

## 2017-03-09 DIAGNOSIS — D120 Benign neoplasm of cecum: Secondary | ICD-10-CM | POA: Diagnosis not present

## 2017-03-09 DIAGNOSIS — D122 Benign neoplasm of ascending colon: Secondary | ICD-10-CM

## 2017-03-09 DIAGNOSIS — Z1211 Encounter for screening for malignant neoplasm of colon: Secondary | ICD-10-CM

## 2017-03-09 MED ORDER — SODIUM CHLORIDE 0.9 % IV SOLN: 500.0000 mL | INTRAVENOUS | Status: DC

## 2017-03-09 NOTE — Op Note (Signed)
[place] Patient Name: Brittany Charles Procedure Date: 03/09/2017 1:24 PM MRN: [id number] Endoscopist: Mauri Pole , MD Age: 66 Referring MD:  Date of Birth: 02/25/1951 Gender: Female Account #: [id number] Procedure:                Colonoscopy Indications:              Screening for colorectal malignant neoplasm Medicines:                Monitored Anesthesia Care Procedure:                Pre-Anesthesia Assessment:                           - Prior to the procedure, a History and Physical                            was performed, and patient medications and                            allergies were reviewed. The patient's tolerance of                            previous anesthesia was also reviewed. The risks                            and benefits of the procedure and the sedation                            options and risks were discussed with the patient.                            All questions were answered, and informed consent                            was obtained. Prior Anticoagulants: The patient has                            taken no previous anticoagulant or antiplatelet                            agents. ASA Grade Assessment: III - A patient with                            severe systemic disease. After reviewing the risks                            and benefits, the patient was deemed in                            satisfactory condition to undergo the procedure.                           After obtaining informed consent, the colonoscope  was passed under direct vision. Throughout the                            procedure, the patient's blood pressure, pulse, and                            oxygen saturations were monitored continuously. The                            Colonoscope was introduced through the anus and                            advanced to the the terminal ileum, with                            identification  of the appendiceal orifice and IC                            valve. The colonoscopy was performed without                            difficulty. The patient tolerated the procedure                            well. The quality of the bowel preparation was                            excellent. The ileocecal valve, appendiceal                            orifice, and rectum were photographed. Scope In: 1:26:39 PM Scope Out: 2:00:34 PM Scope Withdrawal Time: 0 hours 28 minutes 3 seconds  Total Procedure Duration: 0 hours 33 minutes 55 seconds  Findings:                 The perianal and digital rectal examinations were                            normal.                           Three sessile polyps were found in the transverse                            colon, ascending colon and cecum. The polyps were 2                            to 3 mm in size. These polyps were removed with a                            cold biopsy forceps. Resection and retrieval were                            complete.  Two sessile polyps were found in the descending                            colon and ascending colon. The polyps were 4 to 7                            mm in size. These polyps were removed with a cold                            snare. Resection and retrieval were complete.                           Seven pedunculated polyps were found in the                            descending colon, transverse colon and ascending                            colon. The polyps were 10 to 20 mm in size. These                            polyps were removed with a hot snare. Resection and                            retrieval were complete.                           Non-bleeding internal hemorrhoids were found during                            retroflexion. The hemorrhoids were small. Complications:            No immediate complications. Estimated Blood Loss:     Estimated blood loss was  minimal. Impression:               - Three 2 to 3 mm polyps in the transverse colon,                            in the ascending colon and in the cecum, removed                            with a cold biopsy forceps. Resected and retrieved.                           - Two 4 to 7 mm polyps in the descending colon and                            in the ascending colon, removed with a cold snare.                            Resected and retrieved.                           -  Seven 10 to 20 mm polyps in the descending colon,                            in the transverse colon and in the ascending colon,                            removed with a hot snare. Resected and retrieved.                           - Non-bleeding internal hemorrhoids. Recommendation:           - Patient has a contact number available for                            emergencies. The signs and symptoms of potential                            delayed complications were discussed with the                            patient. Return to normal activities tomorrow.                            Written discharge instructions were provided to the                            patient.                           - Resume previous diet.                           - Continue present medications.                           - Await pathology results.                           - Repeat colonoscopy in 1 year for surveillance                            based on pathology results. Mauri Pole, MD 03/09/2017 2:09:38 PM This report has been signed electronically.

## 2017-03-09 NOTE — Progress Notes (Signed)
To PACU, VSS. Report to [email or web address] 

## 2017-03-09 NOTE — Patient Instructions (Addendum)
No Nsaids (non steroidal anti inflammatory products) or aspirin containing products for 2 weeks after polyp removal  Repeat colonoscopy in one year  Await pathology results    Information on polyps given to you today   YOU HAD AN ENDOSCOPIC PROCEDURE TODAY AT [facility]:   Refer to the procedure report that was given to you for any specific questions about what was found during the examination.  If the procedure report does not answer your questions, please call your gastroenterologist to clarify.  If you requested that your care partner not be given the details of your procedure findings, then the procedure report has been included in a sealed envelope for you to review at your convenience later.  YOU SHOULD EXPECT: Some feelings of bloating in the abdomen. Passage of more gas than usual.  Walking can help get rid of the air that was put into your GI tract during the procedure and reduce the bloating. If you had a lower endoscopy (such as a colonoscopy or flexible sigmoidoscopy) you may notice spotting of blood in your stool or on the toilet paper. If you underwent a bowel prep for your procedure, you may not have a normal bowel movement for a few days.  Please Note:  You might notice some irritation and congestion in your nose or some drainage.  This is from the oxygen used during your procedure.  There is no need for concern and it should clear up in a day or so.  SYMPTOMS TO REPORT IMMEDIATELY:   Following lower endoscopy (colonoscopy or flexible sigmoidoscopy):  Excessive amounts of blood in the stool  Significant tenderness or worsening of abdominal pains  Swelling of the abdomen that is new, acute  Fever of 100F or higher    For urgent or emergent issues, a gastroenterologist can be reached at any hour by calling [phone].   DIET:  We do recommend a small meal at first, but then you may proceed to your regular diet.  Drink plenty of fluids but you should  avoid alcoholic beverages for 24 hours.  ACTIVITY:  You should plan to take it easy for the rest of today and you should NOT DRIVE or use heavy machinery until tomorrow (because of the sedation medicines used during the test).    FOLLOW UP: Our staff will call the number listed on your records the next business day following your procedure to check on you and address any questions or concerns that you may have regarding the information given to you following your procedure. If we do not reach you, we will leave a message.  However, if you are feeling well and you are not experiencing any problems, there is no need to return our call.  We will assume that you have returned to your regular daily activities without incident.  If any biopsies were taken you will be contacted by phone or by letter within the next 1-3 weeks.  Please call us at [phone] if you have not heard about the biopsies in 3 weeks.    SIGNATURES/CONFIDENTIALITY: You and/or your care partner have signed paperwork which will be entered into your electronic medical record.  These signatures attest to the fact that that the information above on your After Visit Summary has been reviewed and is understood.  Full responsibility of the confidentiality of this discharge information lies with you and/or your care-partner.

## 2017-03-09 NOTE — Progress Notes (Signed)
Called to room to assist during endoscopic procedure.  Patient ID and intended procedure confirmed with present staff. Received instructions for my participation in the procedure from the performing physician.  

## 2017-03-10 ENCOUNTER — Telehealth: Payer: Self-pay

## 2017-03-10 NOTE — Telephone Encounter (Signed)
  Follow up Call-  Call back number 03/09/2017  Post procedure Call Back phone  # [phone]  Permission to leave phone message Yes  Some recent data might be hidden     Patient questions:  Do you have a fever, pain , or abdominal swelling? No. Pain Score  0 *  Have you tolerated food without any problems? Yes.    Have you been able to return to your normal activities? Yes.    Do you have any questions about your discharge instructions: Diet   No. Medications  No. Follow up visit  No.  Do you have questions or concerns about your Care? No.  Actions: * If pain score is 4 or above: No action needed, pain <4.

## 2017-03-19 ENCOUNTER — Encounter: Payer: Self-pay | Admitting: Gastroenterology

## 2017-05-18 DIAGNOSIS — E1122 Type 2 diabetes mellitus with diabetic chronic kidney disease: Secondary | ICD-10-CM | POA: Diagnosis not present

## 2017-05-18 DIAGNOSIS — N184 Chronic kidney disease, stage 4 (severe): Secondary | ICD-10-CM | POA: Diagnosis not present

## 2017-05-18 DIAGNOSIS — M329 Systemic lupus erythematosus, unspecified: Secondary | ICD-10-CM | POA: Diagnosis not present

## 2017-05-18 DIAGNOSIS — Z23 Encounter for immunization: Secondary | ICD-10-CM | POA: Diagnosis not present

## 2017-05-18 DIAGNOSIS — I129 Hypertensive chronic kidney disease with stage 1 through stage 4 chronic kidney disease, or unspecified chronic kidney disease: Secondary | ICD-10-CM | POA: Diagnosis not present

## 2017-05-18 DIAGNOSIS — M109 Gout, unspecified: Secondary | ICD-10-CM | POA: Diagnosis not present

## 2017-09-23 DIAGNOSIS — N184 Chronic kidney disease, stage 4 (severe): Secondary | ICD-10-CM | POA: Diagnosis not present

## 2017-09-27 DIAGNOSIS — I129 Hypertensive chronic kidney disease with stage 1 through stage 4 chronic kidney disease, or unspecified chronic kidney disease: Secondary | ICD-10-CM | POA: Diagnosis not present

## 2017-09-27 DIAGNOSIS — M109 Gout, unspecified: Secondary | ICD-10-CM | POA: Diagnosis not present

## 2017-09-27 DIAGNOSIS — N184 Chronic kidney disease, stage 4 (severe): Secondary | ICD-10-CM | POA: Diagnosis not present

## 2017-09-27 DIAGNOSIS — E1122 Type 2 diabetes mellitus with diabetic chronic kidney disease: Secondary | ICD-10-CM | POA: Diagnosis not present

## 2017-09-27 DIAGNOSIS — M329 Systemic lupus erythematosus, unspecified: Secondary | ICD-10-CM | POA: Diagnosis not present

## 2017-12-27 DIAGNOSIS — M329 Systemic lupus erythematosus, unspecified: Secondary | ICD-10-CM | POA: Diagnosis not present

## 2017-12-27 DIAGNOSIS — N184 Chronic kidney disease, stage 4 (severe): Secondary | ICD-10-CM | POA: Diagnosis not present

## 2017-12-27 DIAGNOSIS — I129 Hypertensive chronic kidney disease with stage 1 through stage 4 chronic kidney disease, or unspecified chronic kidney disease: Secondary | ICD-10-CM | POA: Diagnosis not present

## 2017-12-27 DIAGNOSIS — E1122 Type 2 diabetes mellitus with diabetic chronic kidney disease: Secondary | ICD-10-CM | POA: Diagnosis not present

## 2017-12-27 DIAGNOSIS — M109 Gout, unspecified: Secondary | ICD-10-CM | POA: Diagnosis not present

## 2018-04-13 DIAGNOSIS — N184 Chronic kidney disease, stage 4 (severe): Secondary | ICD-10-CM | POA: Diagnosis not present

## 2018-04-13 DIAGNOSIS — I129 Hypertensive chronic kidney disease with stage 1 through stage 4 chronic kidney disease, or unspecified chronic kidney disease: Secondary | ICD-10-CM | POA: Diagnosis not present

## 2018-04-13 DIAGNOSIS — Z23 Encounter for immunization: Secondary | ICD-10-CM | POA: Diagnosis not present

## 2018-04-13 DIAGNOSIS — E1122 Type 2 diabetes mellitus with diabetic chronic kidney disease: Secondary | ICD-10-CM | POA: Diagnosis not present

## 2018-04-13 DIAGNOSIS — M329 Systemic lupus erythematosus, unspecified: Secondary | ICD-10-CM | POA: Diagnosis not present

## 2018-05-11 DIAGNOSIS — U071 COVID-19: Secondary | ICD-10-CM

## 2018-05-11 HISTORY — DX: COVID-19: U07.1

## 2018-05-15 ENCOUNTER — Encounter: Payer: Self-pay | Admitting: Gastroenterology

## 2018-08-31 DIAGNOSIS — N184 Chronic kidney disease, stage 4 (severe): Secondary | ICD-10-CM | POA: Diagnosis not present

## 2018-09-02 DIAGNOSIS — N184 Chronic kidney disease, stage 4 (severe): Secondary | ICD-10-CM | POA: Diagnosis not present

## 2018-09-02 DIAGNOSIS — M109 Gout, unspecified: Secondary | ICD-10-CM | POA: Diagnosis not present

## 2018-09-02 DIAGNOSIS — E1122 Type 2 diabetes mellitus with diabetic chronic kidney disease: Secondary | ICD-10-CM | POA: Diagnosis not present

## 2018-09-02 DIAGNOSIS — I129 Hypertensive chronic kidney disease with stage 1 through stage 4 chronic kidney disease, or unspecified chronic kidney disease: Secondary | ICD-10-CM | POA: Diagnosis not present

## 2018-09-02 DIAGNOSIS — M329 Systemic lupus erythematosus, unspecified: Secondary | ICD-10-CM | POA: Diagnosis not present

## 2018-09-05 DIAGNOSIS — I129 Hypertensive chronic kidney disease with stage 1 through stage 4 chronic kidney disease, or unspecified chronic kidney disease: Secondary | ICD-10-CM | POA: Diagnosis not present

## 2018-09-22 ENCOUNTER — Telehealth: Payer: Self-pay | Admitting: Cardiology

## 2018-09-22 NOTE — Telephone Encounter (Signed)
Pt. Does have smart phone.    Virtual Visit Pre-Appointment Phone Call  "(Name), I am calling you today to discuss your upcoming appointment. We are currently trying to limit exposure to the virus that causes COVID-19 by seeing patients at home rather than in the office."  1. "What is the BEST phone number to call the day of the visit?" - include this in appointment notes  2. Do you have or have access to (through a family member/friend) a smartphone with video capability that we can use for your visit?" a. If yes - list this number in appt notes as cell (if different from BEST phone #) and list the appointment type as a VIDEO visit in appointment notes b. If no - list the appointment type as a PHONE visit in appointment notes  3. Confirm consent - "In the setting of the current Covid19 crisis, you are scheduled for a (phone or video) visit with your provider on (date) at (time).  Just as we do with many in-office visits, in order for you to participate in this visit, we must obtain consent.  If you'd like, I can send this to your mychart (if signed up) or email for you to review.  Otherwise, I can obtain your verbal consent now.  All virtual visits are billed to your insurance company just like a normal visit would be.  By agreeing to a virtual visit, we'd like you to understand that the technology does not allow for your provider to perform an examination, and thus may limit your provider's ability to fully assess your condition. If your provider identifies any concerns that need to be evaluated in person, we will make arrangements to do so.  Finally, though the technology is pretty good, we cannot assure that it will always work on either your or our end, and in the setting of a video visit, we may have to convert it to a phone-only visit.  In either situation, we cannot ensure that we have a secure connection.  Are you willing to proceed?" YES  4. Advise patient to be prepared - "Two hours  prior to your appointment, go ahead and check your blood pressure, pulse, oxygen saturation, and your weight (if you have the equipment to check those) and write them all down. When your visit starts, your provider will ask you for this information. If you have an Apple Watch or Kardia device, please plan to have heart rate information ready on the day of your appointment. Please have a pen and paper handy nearby the day of the visit as well."  5. Give patient instructions for MyChart download to smartphone OR Doximity/Doxy.me as below if video visit (depending on what platform provider is using)  6. Inform patient they will receive a phone call 15 minutes prior to their appointment time (may be from unknown caller ID) so they should be prepared to answer    TELEPHONE CALL NOTE  Brittany Charles has been deemed a candidate for a follow-up tele-health visit to limit community exposure during the Covid-19 pandemic. I spoke with the patient via phone to ensure availability of phone/video source, confirm preferred email & phone number, and discuss instructions and expectations.  I reminded Brittany Charles to be prepared with any vital sign and/or heart rhythm information that could potentially be obtained via home monitoring, at the time of her visit. I reminded Brittany Charles to expect a phone call prior to her visit.  Minus Liberty 09/22/2018 3:33  PM   INSTRUCTIONS FOR DOWNLOADING THE MYCHART APP TO SMARTPHONE  - The patient must first make sure to have activated MyChart and know their login information - If Apple, go to [employer] and type in MyChart in the search bar and download the app. If Android, ask patient to go to [employer] and type in [place] in the search bar and download the app. The app is free but as with any other app downloads, their phone may require them to verify saved payment information or Apple/Android password.  - The patient will need to then log into the  app with their MyChart username and password, and select [facility] as their healthcare provider to link the account. When it is time for your visit, go to the MyChart app, find appointments, and click Begin Video Visit. Be sure to Select Allow for your device to access the Microphone and Camera for your visit. You will then be connected, and your provider will be with you shortly.  **If they have any issues connecting, or need assistance please contact MyChart service desk [phone])**  **If using a computer, in order to ensure the best quality for their visit they will need to use either of the following Internet Browsers: [employer], or Google Chrome**  IF USING DOXIMITY or DOXY.ME - The patient will receive a link just prior to their visit by text.     FULL LENGTH CONSENT FOR TELE-HEALTH VISIT   I hereby voluntarily request, consent and authorize [facility] and its employed or contracted physicians, physician assistants, nurse practitioners or other licensed health care professionals (the Practitioner), to provide me with telemedicine health care services (the Services") as deemed necessary by the treating Practitioner. I acknowledge and consent to receive the Services by the Practitioner via telemedicine. I understand that the telemedicine visit will involve communicating with the Practitioner through live audiovisual communication technology and the disclosure of certain medical information by electronic transmission. I acknowledge that I have been given the opportunity to request an in-person assessment or other available alternative prior to the telemedicine visit and am voluntarily participating in the telemedicine visit.  I understand that I have the right to withhold or withdraw my consent to the use of telemedicine in the course of my care at any time, without affecting my right to future care or treatment, and that the Practitioner or I may terminate the  telemedicine visit at any time. I understand that I have the right to inspect all information obtained and/or recorded in the course of the telemedicine visit and may receive copies of available information for a reasonable fee.  I understand that some of the potential risks of receiving the Services via telemedicine include:   Delay or interruption in medical evaluation due to technological equipment failure or disruption;  Information transmitted may not be sufficient (e.g. poor resolution of images) to allow for appropriate medical decision making by the Practitioner; and/or   In rare instances, security protocols could fail, causing a breach of personal health information.  Furthermore, I acknowledge that it is my responsibility to provide information about my medical history, conditions and care that is complete and accurate to the best of my ability. I acknowledge that Practitioner's advice, recommendations, and/or decision may be based on factors not within their control, such as incomplete or inaccurate data provided by me or distortions of diagnostic images or specimens that may result from electronic transmissions. I understand that the practice of medicine is not  an Chief Strategy Officer and that Practitioner makes no warranties or guarantees regarding treatment outcomes. I acknowledge that I will receive a copy of this consent concurrently upon execution via email to the email address I last provided but may also request a printed copy by calling the office of [facility].    I understand that my insurance will be billed for this visit.   I have read or had this consent read to me.  I understand the contents of this consent, which adequately explains the benefits and risks of the Services being provided via telemedicine.   I have been provided ample opportunity to ask questions regarding this consent and the Services and have had my questions answered to my satisfaction.  I give my informed  consent for the services to be provided through the use of telemedicine in my medical care  By participating in this telemedicine visit I agree to the above.

## 2018-09-22 NOTE — Telephone Encounter (Signed)
Pt was added onto Dr Marlou Porch schedule for 5/15.

## 2018-09-23 ENCOUNTER — Telehealth (INDEPENDENT_AMBULATORY_CARE_PROVIDER_SITE_OTHER): Payer: Medicare HMO | Admitting: Cardiology

## 2018-09-23 ENCOUNTER — Encounter: Payer: Self-pay | Admitting: Cardiology

## 2018-09-23 ENCOUNTER — Other Ambulatory Visit: Payer: Self-pay

## 2018-09-23 VITALS — BP 138/72 | HR 80 | Ht 65.0 in | Wt 202.0 lb

## 2018-09-23 DIAGNOSIS — N184 Chronic kidney disease, stage 4 (severe): Secondary | ICD-10-CM

## 2018-09-23 DIAGNOSIS — I10 Essential (primary) hypertension: Secondary | ICD-10-CM

## 2018-09-23 DIAGNOSIS — I471 Supraventricular tachycardia: Secondary | ICD-10-CM | POA: Diagnosis not present

## 2018-09-23 DIAGNOSIS — I420 Dilated cardiomyopathy: Secondary | ICD-10-CM

## 2018-09-23 NOTE — Progress Notes (Signed)
Virtual Visit via Video Note   This visit type was conducted due to national recommendations for restrictions regarding the COVID-19 Pandemic (e.g. social distancing) in an effort to limit this patient's exposure and mitigate transmission in our community.  Due to her co-morbid illnesses, this patient is at least at moderate risk for complications without adequate follow up.  This format is felt to be most appropriate for this patient at this time.  All issues noted in this document were discussed and addressed.  A limited physical exam was performed with this format.  Please refer to the patient's chart for her consent to telehealth for [facility]   Date:  09/23/2018   ID:  Brittany Charles, DOB 02-22-1951, MRN [id number]  Patient Location: Home Provider Location: Home  PCP:  Patient, No Pcp Per  Cardiologist:  No primary care provider on file. [place] Electrophysiologist:  None   Evaluation Performed:  New Patient Evaluation  Chief Complaint:  CAD / SVT   History of Present Illness:    Brittany Charles is a 68 y.o. female with coronary artery disease with cath in 2012 showing mild to moderate LAD stenosis with no significant RCA or circumflex stenosis.  Last seen in our office in 2016.  Previously in the ER in 2015 with rapid heart rate question of atrial flutter with variable block.  Likely SVT.  Converted to sinus rhythm with IV Cardizem at that time.  Given a prescription for Cardizem CD 120 mg.  EF 45 to 50%.  No further issues with SVT.  Prior office visit was 09/17/2014.  Sometimes feels fatigued.  Creatinine at one point was 2.4.  Dr Florene Glen transitioned her care to another partner- Creat 3, watching.   Feels well, staying in. Kids are there. No SVT.   Not having any anginal symptoms.  She is taking her medications.  No early family history of CAD.   The patient does not have symptoms concerning for COVID-19 infection (fever, chills, cough, or new shortness of  breath).    Past Medical History:  Diagnosis Date  . Arthritis   . CHF (congestive heart failure) ([place])   . Fibroids   . Hypertension   . Lupus (systemic lupus erythematosus) ([place])   . Myocardial infarction ([place])   . Renal insufficiency    Past Surgical History:  Procedure Laterality Date  . ABDOMINAL HYSTERECTOMY  10/27/2000   TAH, BSO  . LEFT AND RIGHT HEART CATHETERIZATION WITH CORONARY ANGIOGRAM Right 04/21/2011   Procedure: LEFT AND RIGHT HEART CATHETERIZATION WITH CORONARY ANGIOGRAM;  Surgeon: [name], MD;  Location: [facility] CATH LAB;  Service: Cardiovascular;  Laterality: Right;  . TONSILLECTOMY       Current Meds  Medication Sig  . acetaminophen (TYLENOL) 325 MG tablet Take 650 mg by mouth as needed.  Marland Kitchen allopurinol (ZYLOPRIM) 100 MG tablet TAKE 1 TABLET EVERY DAY  . COLCRYS 0.6 MG tablet Take 0.6 mg by mouth as needed.  . diltiazem (CARDIZEM CD) 300 MG 24 hr capsule Take 300 mg by mouth daily.  . furosemide (LASIX) 80 MG tablet Take 80 mg by mouth daily.  . hydrALAZINE (APRESOLINE) 50 MG tablet TAKE 1 TABLET THREE TIMES DAILY   Current Facility-Administered Medications for the 09/23/18 encounter (Telemedicine) with Jerline Pain, MD  Medication  . 0.9 %  sodium chloride infusion     Allergies:   Zithromax [azithromycin]   Social History   Tobacco Use  . Smoking status: Never Smoker  . Smokeless  tobacco: Never Used  Substance Use Topics  . Alcohol use: No  . Drug use: No     Family Hx: The patient's family history includes Breast cancer in her unknown relative; Colon cancer in her unknown relative; Heart disease in her mother; Hyperlipidemia in her father; Hypertension in her father and mother; Stroke in her father, maternal grandmother, and mother.  ROS:   Please see the history of present illness.  Denies any fevers chills nausea vomiting syncope bleeding palpitations All other systems reviewed and are negative.   Prior CV studies:   The following  studies were reviewed today:  2-D echo 10/02/12 Study Conclusions  - Left ventricle: Diffuse hypokinesis worse in the inferior base The cavity size was mildly dilated. Wall thickness was increased in a pattern of mild LVH. Systolic function was mildly reduced. The estimated ejection fraction was in the range of 45% to 50%. - Left atrium: The atrium was mildly dilated. - Right atrium: The atrium was mildly dilated. - Tricuspid valve: Mild-moderate regurgitation. - Pulmonary arteries: PA peak pressure: 62mm Hg (S). Transthoracic echocardiography.   Labs/Other Tests and Data Reviewed:    EKG:  An ECG dated 09/17/14 was personally reviewed today and demonstrated:  Sinus rhythm 79 with nonspecific ST-T wave changes  Recent Labs: No results found for requested labs within last 8760 hours.   Recent Lipid Panel Lab Results  Component Value Date/Time   CHOL 136 04/17/2011 07:00 AM   TRIG 113 04/17/2011 07:00 AM   HDL 22 (L) 04/17/2011 07:00 AM   CHOLHDL 6.2 04/17/2011 07:00 AM   LDLCALC 91 04/17/2011 07:00 AM    Wt Readings from Last 3 Encounters:  09/23/18 202 lb (91.6 kg)  03/09/17 185 lb (83.9 kg)  03/02/17 185 lb (83.9 kg)     Objective:    Vital Signs:  BP 138/72   Pulse 80   Ht 5\' 5"  (1.651 m)   Wt 202 lb (91.6 kg)   BMI 33.61 kg/m    VITAL SIGNS:  reviewed GEN:  no acute distress EYES:  sclerae anicteric, EOMI - Extraocular Movements Intact RESPIRATORY:  normal respiratory effort, symmetric expansion SKIN:  no rash, lesions or ulcers. MUSCULOSKELETAL:  no obvious deformities. NEURO:  alert and oriented x 3, no obvious focal deficit PSYCH:  normal affect  ASSESSMENT & PLAN:    Supraventricular tachycardia - No changes, no current episodes.  Taking diltiazem long-acting 300 mg once a day.  Continue.  Cardiomyopathy - Prior ejection fraction in the 50% range.  Overall she is feeling well, NYHA class I symptoms.  Remains on hydralazine, no ACE inhibitor  because of chronic kidney disease stage IV.  Last creatinine in the 3 range.  I am comfortable with her continuing her diltiazem.  No beta-blocker since she is taking diltiazem.  Seems to be doing well.  Essential hypertension -Also on hydralazine.  Mild LVH noted on prior echocardiogram in 2014.  Continue to monitor, [place] [facility] has been watching.    COVID-19 Education: The signs and symptoms of COVID-19 were discussed with the patient and how to seek care for testing (follow up with PCP or arrange E-visit).  The importance of social distancing was discussed today.  Time:   Today, I have spent 11 minutes with the patient with telehealth technology discussing the above problems.     Medication Adjustments/Labs and Tests Ordered: Current medicines are reviewed at length with the patient today.  Concerns regarding medicines are outlined above.   Tests Ordered:  No orders of the defined types were placed in this encounter.   Medication Changes: No orders of the defined types were placed in this encounter.   Disposition:  Follow up in 6 month(s)  Signed, Candee Furbish, MD  09/23/2018 9:57 AM    [facility]

## 2018-09-23 NOTE — Patient Instructions (Signed)
Medication Instructions:  Your physician recommends that you continue on your current medications as directed. Please refer to the Current Medication list given to you today.  If you need a refill on your cardiac medications before your next appointment, please call your pharmacy.   Lab work: None If you have labs (blood work) drawn today and your tests are completely normal, you will receive your results only by: Marland Kitchen MyChart Message (if you have MyChart) OR . A paper copy in the mail If you have any lab test that is abnormal or we need to change your treatment, we will call you to review the results.  Testing/Procedures: None  Follow-Up: Your physician wants you to follow-up in: 6 months with Cecilie Kicks, Np. You will receive a reminder letter in the mail two months in advance. If you don't receive a letter, please call our office to schedule the follow-up appointment.  At [facility], you and your health needs are our priority.  As part of our continuing mission to provide you with exceptional heart care, we have created designated Provider Care Teams.  These Care Teams include your primary Cardiologist (physician) and Advanced Practice Providers (APPs -  Physician Assistants and Nurse Practitioners) who all work together to provide you with the care you need, when you need it. You will need a follow up appointment in 12 months.  Please call our office 2 months in advance to schedule this appointment.  You may see Dr. Marlou Porch or one of the following Advanced Practice Providers on your designated Care Team:   Truitt Merle, NP Cecilie Kicks, NP . Kathyrn Drown, NP  Any Other Special Instructions Will Be Listed Below (If Applicable).

## 2018-10-12 DIAGNOSIS — N184 Chronic kidney disease, stage 4 (severe): Secondary | ICD-10-CM | POA: Diagnosis not present

## 2018-10-17 DIAGNOSIS — E1122 Type 2 diabetes mellitus with diabetic chronic kidney disease: Secondary | ICD-10-CM | POA: Diagnosis not present

## 2018-10-17 DIAGNOSIS — M329 Systemic lupus erythematosus, unspecified: Secondary | ICD-10-CM | POA: Diagnosis not present

## 2018-10-17 DIAGNOSIS — I129 Hypertensive chronic kidney disease with stage 1 through stage 4 chronic kidney disease, or unspecified chronic kidney disease: Secondary | ICD-10-CM | POA: Diagnosis not present

## 2018-10-17 DIAGNOSIS — N184 Chronic kidney disease, stage 4 (severe): Secondary | ICD-10-CM | POA: Diagnosis not present

## 2018-10-17 DIAGNOSIS — M109 Gout, unspecified: Secondary | ICD-10-CM | POA: Diagnosis not present

## 2018-10-17 DIAGNOSIS — M25579 Pain in unspecified ankle and joints of unspecified foot: Secondary | ICD-10-CM | POA: Diagnosis not present

## 2018-11-21 ENCOUNTER — Other Ambulatory Visit: Payer: Self-pay

## 2018-11-21 ENCOUNTER — Ambulatory Visit: Payer: Medicare HMO | Admitting: Family Medicine

## 2018-11-21 ENCOUNTER — Encounter: Payer: Self-pay | Admitting: Family Medicine

## 2018-11-21 VITALS — BP 138/68 | HR 89 | Temp 98.7°F | Resp 12 | Wt 199.8 lb

## 2018-11-21 DIAGNOSIS — N184 Chronic kidney disease, stage 4 (severe): Secondary | ICD-10-CM

## 2018-11-21 DIAGNOSIS — Z1239 Encounter for other screening for malignant neoplasm of breast: Secondary | ICD-10-CM | POA: Diagnosis not present

## 2018-11-21 DIAGNOSIS — Z13220 Encounter for screening for lipoid disorders: Secondary | ICD-10-CM

## 2018-11-21 DIAGNOSIS — Z1211 Encounter for screening for malignant neoplasm of colon: Secondary | ICD-10-CM

## 2018-11-21 DIAGNOSIS — M329 Systemic lupus erythematosus, unspecified: Secondary | ICD-10-CM | POA: Diagnosis not present

## 2018-11-21 DIAGNOSIS — E2839 Other primary ovarian failure: Secondary | ICD-10-CM | POA: Diagnosis not present

## 2018-11-21 DIAGNOSIS — M109 Gout, unspecified: Secondary | ICD-10-CM | POA: Diagnosis not present

## 2018-11-21 DIAGNOSIS — Z13820 Encounter for screening for osteoporosis: Secondary | ICD-10-CM

## 2018-11-21 DIAGNOSIS — Z23 Encounter for immunization: Secondary | ICD-10-CM

## 2018-11-21 DIAGNOSIS — E1122 Type 2 diabetes mellitus with diabetic chronic kidney disease: Secondary | ICD-10-CM | POA: Diagnosis not present

## 2018-11-21 DIAGNOSIS — I10 Essential (primary) hypertension: Secondary | ICD-10-CM | POA: Diagnosis not present

## 2018-11-21 DIAGNOSIS — I420 Dilated cardiomyopathy: Secondary | ICD-10-CM | POA: Diagnosis not present

## 2018-11-21 NOTE — Patient Instructions (Addendum)
  Nice talking to you today.  We will check some baseline labs.  No med changes for now.  Follow-up in the next 2 months, sooner if needed. Stay safe.    If you have lab work done today you will be contacted with your lab results within the next 2 weeks.  If you have not heard from [place] then please contact us. The fastest way to get your results is to register for My Chart.   IF you received an x-ray today, you will receive an invoice from [facility]. Please contact [facility] at [phone] with questions or concerns regarding your invoice.   IF you received labwork today, you will receive an invoice from [facility]. Please contact [facility] at [phone] with questions or concerns regarding your invoice.   Our billing staff will not be able to assist you with questions regarding bills from these companies.  You will be contacted with the lab results as soon as they are available. The fastest way to get your results is to activate your My Chart account. Instructions are located on the last page of this paperwork. If you have not heard from [place] regarding the results in 2 weeks, please contact this office.

## 2018-11-21 NOTE — Progress Notes (Signed)
Subjective:    Patient ID: Brittany Charles, female    DOB: [birth year], 68 y.o.   MRN: [id number]  HPI Brittany Charles is a 68 y.o. female Presents today for: Chief Complaint  Patient presents with  . chronic medical condition    Patient is doing well stated she have not been seen in a while and just need a check up. Patient is not having any problem or issus at this time   Last seen by me in 2016 for acute issue. Followed by Dr. Marlou Porch with cardiology, history of hypertension, dilated cardiomyopathy, PSVT. Chronic kidney disease, previously followed by Dr. Erling Cruz with nephrology, now with Dr. Johnney Ou at [facility]. Last appt 4/27 - creat in the 2 range. Next appt in October.  Occasional lasix for swelling.   Hypertension with CKD stage IV, dilated cardiomyopathy, PSVT as above:  BP Readings from Last 3 Encounters:  11/21/18 138/68  09/23/18 138/72  03/09/17 (!) 154/71   Lab Results  Component Value Date   CREATININE 1.99 (H) 01/14/2017  Telemedicine visit with Dr. Marlou Porch on May 15.  Previous office visit few years ago.  NYHA class I symptoms with her prior EF in the 50% range.  Was continued on hydralazine, no ACE inhibitor because of chronic kidney disease.  Continued on diltiazem and avoiding beta-blocker.  Mild LVH on echo in 2014, plan for continued monitoring and follow-up with nephrology. Not on HD. Monitored by nephrology every 6 months  Lupus: No meds need in past 20 years, uses cane to walk - balance issues at times.   Gout: Colchicine as needed - 1 pill as needed during flair - daily dose and tylenol, No nsaids. Flare about once per month.  Allopurinol 100mg  per day.   Lab Results  Component Value Date   LABURIC 11.4 (H) 03/28/2015   Diabetes: No recent A1c on file, but diagnosis of type 2 diabetes from nephrology notes.  Plan for CMP, lipid panel today. No meds   Lab Results  Component Value Date   HGBA1C 5.5 09/12/2014   Lab Results   Component Value Date   LDLCALC 91 04/17/2011   CREATININE 1.99 (H) 01/14/2017    Health maintenance: Due for bone density screening, mammogram, Pneumovax, colonoscopy.  Referrals placed for mammogram, DEXA, colonoscopy,  Pneumovax today.   Patient Active Problem List   Diagnosis Date Noted  . Colitis   . Hematochezia 01/11/2017  . Fatigue 09/17/2014  . Systolic CHF ([place]) 74/25/9563  . CKD (chronic kidney disease), stage III ([place]) 04/22/2011  . Hypertension 04/17/2011  . Gout 04/17/2011  . Lupus (systemic lupus erythematosus) ([place]) 04/17/2011   Past Medical History:  Diagnosis Date  . Arthritis   . CHF (congestive heart failure) ([place])   . Fibroids   . Hypertension   . Lupus (systemic lupus erythematosus) ([place])   . Myocardial infarction ([place])   . Renal insufficiency    Past Surgical History:  Procedure Laterality Date  . ABDOMINAL HYSTERECTOMY  10/27/2000   TAH, BSO  . LEFT AND RIGHT HEART CATHETERIZATION WITH CORONARY ANGIOGRAM Right 04/21/2011   Procedure: LEFT AND RIGHT HEART CATHETERIZATION WITH CORONARY ANGIOGRAM;  Surgeon: [name], MD;  Location: [facility] CATH LAB;  Service: Cardiovascular;  Laterality: Right;  . TONSILLECTOMY     Allergies  Allergen Reactions  . Zithromax [Azithromycin] Nausea And Vomiting   Prior to Admission medications   Medication Sig Start Date End Date Taking? Authorizing Provider  acetaminophen (TYLENOL) 325 MG tablet Take  650 mg by mouth as needed.   Yes [provider]  allopurinol (ZYLOPRIM) 100 MG tablet TAKE 1 TABLET EVERY DAY 10/10/15  Yes Posey Boyer, MD  COLCRYS 0.6 MG tablet Take 0.6 mg by mouth as needed. 07/28/18  Yes [provider]  diltiazem (CARDIZEM CD) 300 MG 24 hr capsule Take 300 mg by mouth daily. 08/20/18  Yes [provider]  furosemide (LASIX) 80 MG tablet Take 80 mg by mouth daily. 06/30/18  Yes [provider]  hydrALAZINE (APRESOLINE) 50 MG tablet TAKE 1 TABLET THREE TIMES DAILY  10/10/15  Yes Posey Boyer, MD   Social History   Socioeconomic History  . Marital status: Single    Spouse name: Not on file  . Number of children: Not on file  . Years of education: Not on file  . Highest education level: Not on file  Occupational History  . Not on file  Social Needs  . Financial resource strain: Not on file  . Food insecurity    Worry: Not on file    Inability: Not on file  . Transportation needs    Medical: Not on file    Non-medical: Not on file  Tobacco Use  . Smoking status: Never Smoker  . Smokeless tobacco: Never Used  Substance and Sexual Activity  . Alcohol use: No  . Drug use: No  . Sexual activity: Never  Lifestyle  . Physical activity    Days per week: Not on file    Minutes per session: Not on file  . Stress: Not on file  Relationships  . Social Herbalist on phone: Not on file    Gets together: Not on file    Attends religious service: Not on file    Active member of club or organization: Not on file    Attends meetings of clubs or organizations: Not on file    Relationship status: Not on file  . Intimate partner violence    Fear of current or ex partner: Not on file    Emotionally abused: Not on file    Physically abused: Not on file    Forced sexual activity: Not on file  Other Topics Concern  . Not on file  Social History Narrative  . Not on file    Review of Systems  Constitutional: Negative for fatigue and unexpected weight change.  Respiratory: Negative for chest tightness and shortness of breath.   Cardiovascular: Negative for chest pain, palpitations and leg swelling.  Gastrointestinal: Negative for abdominal pain and blood in stool.  Neurological: Negative for dizziness, syncope, light-headedness and headaches.        Objective:   Physical Exam Vitals signs reviewed.  Constitutional:      Appearance: She is well-developed.  HENT:     Head: Normocephalic and atraumatic.  Eyes:      Conjunctiva/sclera: Conjunctivae normal.     Pupils: Pupils are equal, round, and reactive to light.  Neck:     Vascular: No carotid bruit.  Cardiovascular:     Rate and Rhythm: Normal rate and regular rhythm.     Heart sounds: Normal heart sounds.  Pulmonary:     Effort: Pulmonary effort is normal.     Breath sounds: Normal breath sounds.  Abdominal:     Palpations: Abdomen is soft. There is no pulsatile mass.     Tenderness: There is no abdominal tenderness.  Skin:    General: Skin is warm and dry.  Neurological:     Mental Status: She is alert and oriented to person, place, and time.  Psychiatric:        Behavior: Behavior normal.    Vitals:   11/21/18 1044 11/21/18 1051  BP: (!) 161/62 138/68  Pulse: 89   Resp: 12   Temp: 98.7 F (37.1 C)   TempSrc: Oral   SpO2: 96%   Weight: 199 lb 12.8 oz (90.6 kg)        Assessment & Plan:  Brittany Charles is a 68 y.o. female Essential hypertension - Plan: Comprehensive metabolic panel  -Followed by nephrology, cardiology, repeat testing okay.  No med changes, continue follow-up with specialists  Type 2 diabetes mellitus with stage 4 chronic kidney disease, without long-term current use of insulin ([place]) - Plan: Comprehensive metabolic panel, Hemoglobin A1c  -Check A1c, as recorded history of diabetes but no current meds.  Possible diet controlled.  Can discuss further at follow-up  Lupus [facility])  -Denies treatments or current symptoms.  No changes at this time.  Also can discuss at follow-up.  Gout, unspecified cause, unspecified chronicity, unspecified site - Plan: Uric Acid  -Currently not allopurinol, but reports frequent flares.  Check uric acid determine if changes indicated, and may need to coordinate with her nephrologist given her renal disease  Screening for breast cancer - Plan: MM Digital Screening  Screening for osteoporosis - Plan: DG Bone Density Estrogen deficiency - Plan: DG Bone Density  Screen for colon  cancer - Plan: Ambulatory referral to Gastroenterology  Screening for hyperlipidemia - Plan: Lipid Panel, Comprehensive metabolic panel   No orders of the defined types were placed in this encounter.  Patient Instructions    Nice talking to you today.  We will check some baseline labs.  No med changes for now.  Follow-up in the next 2 months, sooner if needed. Stay safe.    If you have lab work done today you will be contacted with your lab results within the next 2 weeks.  If you have not heard from [place] then please contact us. The fastest way to get your results is to register for My Chart.   IF you received an x-ray today, you will receive an invoice from [facility]. Please contact [facility] at [phone] with questions or concerns regarding your invoice.   IF you received labwork today, you will receive an invoice from [facility]. Please contact [facility] at [phone] with questions or concerns regarding your invoice.   Our billing staff will not be able to assist you with questions regarding bills from these companies.  You will be contacted with the lab results as soon as they are available. The fastest way to get your results is to activate your My Chart account. Instructions are located on the last page of this paperwork. If you have not heard from [place] regarding the results in 2 weeks, please contact this office.       Signed,   Merri Ray, MD [facility] [place].  11/22/18 10:42 AM

## 2018-11-22 ENCOUNTER — Encounter: Payer: Self-pay | Admitting: Family Medicine

## 2018-11-22 LAB — LIPID PANEL
Chol/HDL Ratio: 5 ratio — ABNORMAL HIGH (ref 0.0–4.4)
Cholesterol, Total: 212 mg/dL — ABNORMAL HIGH (ref 100–199)
HDL: 42 mg/dL (ref >39)
LDL Calculated: 132 mg/dL — ABNORMAL HIGH (ref 0–99)
Triglycerides: 189 mg/dL — ABNORMAL HIGH (ref 0–149)
VLDL Cholesterol Cal: 38 mg/dL (ref 5–40)

## 2018-11-22 LAB — COMPREHENSIVE METABOLIC PANEL
ALT: 9 IU/L (ref 0–32)
AST: 16 IU/L (ref 0–40)
Albumin/Globulin Ratio: 1.1 — ABNORMAL LOW (ref 1.2–2.2)
Albumin: 4.2 g/dL (ref 3.8–4.8)
Alkaline Phosphatase: 213 IU/L — ABNORMAL HIGH (ref 39–117)
BUN/Creatinine Ratio: 15 (ref 12–28)
BUN: 43 mg/dL — ABNORMAL HIGH (ref 8–27)
Bilirubin Total: <0.2 mg/dL (ref 0.0–1.2)
CO2: 18 mmol/L — ABNORMAL LOW (ref 20–29)
Calcium: 9.7 mg/dL (ref 8.7–10.3)
Chloride: 107 mmol/L — ABNORMAL HIGH (ref 96–106)
Creatinine, Ser: 2.89 mg/dL — ABNORMAL HIGH (ref 0.57–1.00)
GFR calc Af Amer: 19 mL/min/{1.73_m2} — ABNORMAL LOW (ref >59)
GFR calc non Af Amer: 16 mL/min/{1.73_m2} — ABNORMAL LOW (ref >59)
Globulin, Total: 3.7 g/dL (ref 1.5–4.5)
Glucose: 97 mg/dL (ref 65–99)
Potassium: 4.4 mmol/L (ref 3.5–5.2)
Sodium: 140 mmol/L (ref 134–144)
Total Protein: 7.9 g/dL (ref 6.0–8.5)

## 2018-11-22 LAB — HEMOGLOBIN A1C
Est. average glucose Bld gHb Est-mCnc: 131 mg/dL
Hgb A1c MFr Bld: 6.2 % — ABNORMAL HIGH (ref 4.8–5.6)

## 2018-11-22 LAB — URIC ACID: Uric Acid: 6.7 mg/dL (ref 2.5–7.1)

## 2018-12-22 ENCOUNTER — Encounter: Payer: Self-pay | Admitting: Family Medicine

## 2019-01-23 ENCOUNTER — Telehealth: Payer: Medicare HMO | Admitting: Family Medicine

## 2019-01-23 ENCOUNTER — Ambulatory Visit: Payer: Medicare HMO | Admitting: Family Medicine

## 2019-01-24 ENCOUNTER — Encounter: Payer: Self-pay | Admitting: Family Medicine

## 2019-02-14 DIAGNOSIS — E1122 Type 2 diabetes mellitus with diabetic chronic kidney disease: Secondary | ICD-10-CM | POA: Diagnosis not present

## 2019-02-14 DIAGNOSIS — I129 Hypertensive chronic kidney disease with stage 1 through stage 4 chronic kidney disease, or unspecified chronic kidney disease: Secondary | ICD-10-CM | POA: Diagnosis not present

## 2019-02-14 DIAGNOSIS — N184 Chronic kidney disease, stage 4 (severe): Secondary | ICD-10-CM | POA: Diagnosis not present

## 2019-02-14 DIAGNOSIS — M109 Gout, unspecified: Secondary | ICD-10-CM | POA: Diagnosis not present

## 2019-02-14 DIAGNOSIS — M329 Systemic lupus erythematosus, unspecified: Secondary | ICD-10-CM | POA: Diagnosis not present

## 2019-04-13 ENCOUNTER — Telehealth: Payer: Self-pay | Admitting: *Deleted

## 2019-04-13 NOTE — Telephone Encounter (Signed)
Unable to reach by phone to schedule AWV

## 2019-07-02 ENCOUNTER — Ambulatory Visit: Payer: Medicare HMO | Attending: Internal Medicine

## 2019-07-02 DIAGNOSIS — Z23 Encounter for immunization: Secondary | ICD-10-CM | POA: Insufficient documentation

## 2019-07-02 NOTE — Progress Notes (Signed)
   Covid-19 Vaccination Clinic  Name:  Brittany Charles    MRN: [id number] DOB: 10-May-1951  07/02/2019  Ms. Areola was observed post Covid-19 immunization for 15 minutes without incidence. She was provided with Vaccine Information Sheet and instruction to access the V-Safe system.   [name] was instructed to call 911 with any severe reactions post vaccine: Marland Kitchen Difficulty breathing  . Swelling of your face and throat  . A fast heartbeat  . A bad rash all over your body  . Dizziness and weakness    Immunizations Administered    Name Date Dose VIS Date Route   Pfizer COVID-19 Vaccine 07/02/2019  8:21 AM 0.3 mL 04/21/2019 Intramuscular   Manufacturer: [place]   Lot: [id number]   [place]: [id number]

## 2019-07-03 DIAGNOSIS — M109 Gout, unspecified: Secondary | ICD-10-CM | POA: Diagnosis not present

## 2019-07-03 DIAGNOSIS — N184 Chronic kidney disease, stage 4 (severe): Secondary | ICD-10-CM | POA: Diagnosis not present

## 2019-07-03 DIAGNOSIS — I129 Hypertensive chronic kidney disease with stage 1 through stage 4 chronic kidney disease, or unspecified chronic kidney disease: Secondary | ICD-10-CM | POA: Diagnosis not present

## 2019-07-03 DIAGNOSIS — E1122 Type 2 diabetes mellitus with diabetic chronic kidney disease: Secondary | ICD-10-CM | POA: Diagnosis not present

## 2019-07-03 DIAGNOSIS — M329 Systemic lupus erythematosus, unspecified: Secondary | ICD-10-CM | POA: Diagnosis not present

## 2019-07-25 ENCOUNTER — Ambulatory Visit: Payer: Medicare HMO | Attending: Internal Medicine

## 2019-07-25 DIAGNOSIS — Z23 Encounter for immunization: Secondary | ICD-10-CM

## 2019-07-25 NOTE — Progress Notes (Signed)
   Covid-19 Vaccination Clinic  Name:  Brittany Charles    MRN: [id number] DOB: [birth year]  07/25/2019  [name] was observed post Covid-19 immunization for 15 minutes without incident. She was provided with Vaccine Information Sheet and instruction to access the V-Safe system.   [name] was instructed to call 911 with any severe reactions post vaccine: Marland Kitchen Difficulty breathing  . Swelling of face and throat  . A fast heartbeat  . A bad rash all over body  . Dizziness and weakness   Immunizations Administered    Name Date Dose VIS Date Route   Pfizer COVID-19 Vaccine 07/25/2019  2:11 PM 0.3 mL 04/21/2019 Intramuscular   Manufacturer: [place]   Lot: [id number]   [place]: [id number]

## 2019-10-24 DIAGNOSIS — N2581 Secondary hyperparathyroidism of renal origin: Secondary | ICD-10-CM | POA: Diagnosis not present

## 2019-10-24 DIAGNOSIS — M329 Systemic lupus erythematosus, unspecified: Secondary | ICD-10-CM | POA: Diagnosis not present

## 2019-10-24 DIAGNOSIS — I129 Hypertensive chronic kidney disease with stage 1 through stage 4 chronic kidney disease, or unspecified chronic kidney disease: Secondary | ICD-10-CM | POA: Diagnosis not present

## 2019-10-24 DIAGNOSIS — N184 Chronic kidney disease, stage 4 (severe): Secondary | ICD-10-CM | POA: Diagnosis not present

## 2019-10-24 DIAGNOSIS — M109 Gout, unspecified: Secondary | ICD-10-CM | POA: Diagnosis not present

## 2019-10-24 DIAGNOSIS — E1122 Type 2 diabetes mellitus with diabetic chronic kidney disease: Secondary | ICD-10-CM | POA: Diagnosis not present

## 2019-12-30 ENCOUNTER — Inpatient Hospital Stay (HOSPITAL_COMMUNITY)
Admission: EM | Admit: 2019-12-30 | Discharge: 2020-01-04 | DRG: 177 | Disposition: A | Discharge status: Home / Self Care | Payer: Medicare HMO | Attending: Internal Medicine | Admitting: Internal Medicine

## 2019-12-30 ENCOUNTER — Encounter (HOSPITAL_COMMUNITY): Payer: Self-pay

## 2019-12-30 ENCOUNTER — Other Ambulatory Visit: Payer: Self-pay

## 2019-12-30 ENCOUNTER — Emergency Department (HOSPITAL_COMMUNITY): Payer: Medicare HMO

## 2019-12-30 DIAGNOSIS — J1282 Pneumonia due to coronavirus disease 2019: Secondary | ICD-10-CM | POA: Diagnosis not present

## 2019-12-30 DIAGNOSIS — E669 Obesity, unspecified: Secondary | ICD-10-CM | POA: Diagnosis present

## 2019-12-30 DIAGNOSIS — I480 Paroxysmal atrial fibrillation: Secondary | ICD-10-CM | POA: Diagnosis present

## 2019-12-30 DIAGNOSIS — I130 Hypertensive heart and chronic kidney disease with heart failure and stage 1 through stage 4 chronic kidney disease, or unspecified chronic kidney disease: Secondary | ICD-10-CM | POA: Diagnosis not present

## 2019-12-30 DIAGNOSIS — I471 Supraventricular tachycardia: Secondary | ICD-10-CM | POA: Diagnosis not present

## 2019-12-30 DIAGNOSIS — U071 COVID-19: Secondary | ICD-10-CM | POA: Diagnosis not present

## 2019-12-30 DIAGNOSIS — Z823 Family history of stroke: Secondary | ICD-10-CM

## 2019-12-30 DIAGNOSIS — M329 Systemic lupus erythematosus, unspecified: Secondary | ICD-10-CM | POA: Diagnosis present

## 2019-12-30 DIAGNOSIS — E1122 Type 2 diabetes mellitus with diabetic chronic kidney disease: Secondary | ICD-10-CM | POA: Diagnosis not present

## 2019-12-30 DIAGNOSIS — Z6833 Body mass index (BMI) 33.0-33.9, adult: Secondary | ICD-10-CM | POA: Diagnosis not present

## 2019-12-30 DIAGNOSIS — A09 Infectious gastroenteritis and colitis, unspecified: Secondary | ICD-10-CM

## 2019-12-30 DIAGNOSIS — I2510 Atherosclerotic heart disease of native coronary artery without angina pectoris: Secondary | ICD-10-CM | POA: Diagnosis present

## 2019-12-30 DIAGNOSIS — N183 Chronic kidney disease, stage 3 unspecified: Secondary | ICD-10-CM | POA: Diagnosis not present

## 2019-12-30 DIAGNOSIS — N170 Acute kidney failure with tubular necrosis: Secondary | ICD-10-CM | POA: Diagnosis not present

## 2019-12-30 DIAGNOSIS — R531 Weakness: Secondary | ICD-10-CM | POA: Diagnosis not present

## 2019-12-30 DIAGNOSIS — E860 Dehydration: Secondary | ICD-10-CM | POA: Diagnosis present

## 2019-12-30 DIAGNOSIS — E878 Other disorders of electrolyte and fluid balance, not elsewhere classified: Secondary | ICD-10-CM | POA: Diagnosis present

## 2019-12-30 DIAGNOSIS — I5032 Chronic diastolic (congestive) heart failure: Secondary | ICD-10-CM | POA: Diagnosis not present

## 2019-12-30 DIAGNOSIS — I10 Essential (primary) hypertension: Secondary | ICD-10-CM

## 2019-12-30 DIAGNOSIS — Z8249 Family history of ischemic heart disease and other diseases of the circulatory system: Secondary | ICD-10-CM

## 2019-12-30 DIAGNOSIS — I509 Heart failure, unspecified: Secondary | ICD-10-CM

## 2019-12-30 DIAGNOSIS — Z79899 Other long term (current) drug therapy: Secondary | ICD-10-CM

## 2019-12-30 DIAGNOSIS — Y929 Unspecified place or not applicable: Secondary | ICD-10-CM

## 2019-12-30 DIAGNOSIS — I158 Other secondary hypertension: Secondary | ICD-10-CM | POA: Diagnosis present

## 2019-12-30 DIAGNOSIS — I272 Pulmonary hypertension, unspecified: Secondary | ICD-10-CM | POA: Diagnosis present

## 2019-12-30 DIAGNOSIS — D631 Anemia in chronic kidney disease: Secondary | ICD-10-CM | POA: Diagnosis not present

## 2019-12-30 DIAGNOSIS — E872 Acidosis: Secondary | ICD-10-CM | POA: Diagnosis present

## 2019-12-30 DIAGNOSIS — I4891 Unspecified atrial fibrillation: Secondary | ICD-10-CM | POA: Diagnosis not present

## 2019-12-30 DIAGNOSIS — N179 Acute kidney failure, unspecified: Secondary | ICD-10-CM | POA: Diagnosis not present

## 2019-12-30 DIAGNOSIS — I495 Sick sinus syndrome: Secondary | ICD-10-CM | POA: Diagnosis present

## 2019-12-30 DIAGNOSIS — N184 Chronic kidney disease, stage 4 (severe): Secondary | ICD-10-CM | POA: Diagnosis not present

## 2019-12-30 DIAGNOSIS — Z83438 Family history of other disorder of lipoprotein metabolism and other lipidemia: Secondary | ICD-10-CM

## 2019-12-30 DIAGNOSIS — T380X5A Adverse effect of glucocorticoids and synthetic analogues, initial encounter: Secondary | ICD-10-CM | POA: Diagnosis present

## 2019-12-30 DIAGNOSIS — Z888 Allergy status to other drugs, medicaments and biological substances status: Secondary | ICD-10-CM

## 2019-12-30 DIAGNOSIS — R7989 Other specified abnormal findings of blood chemistry: Secondary | ICD-10-CM

## 2019-12-30 DIAGNOSIS — R1310 Dysphagia, unspecified: Secondary | ICD-10-CM | POA: Diagnosis present

## 2019-12-30 DIAGNOSIS — I252 Old myocardial infarction: Secondary | ICD-10-CM

## 2019-12-30 DIAGNOSIS — R05 Cough: Secondary | ICD-10-CM | POA: Diagnosis not present

## 2019-12-30 LAB — COMPREHENSIVE METABOLIC PANEL
ALT: 14 U/L (ref 0–44)
AST: 19 U/L (ref 15–41)
Albumin: 3.3 g/dL — ABNORMAL LOW (ref 3.5–5.0)
Alkaline Phosphatase: 90 U/L (ref 38–126)
Anion gap: 18 — ABNORMAL HIGH (ref 5–15)
BUN: 98 mg/dL — ABNORMAL HIGH (ref 8–23)
CO2: 18 mmol/L — ABNORMAL LOW (ref 22–32)
Calcium: 8.7 mg/dL — ABNORMAL LOW (ref 8.9–10.3)
Chloride: 103 mmol/L (ref 98–111)
Creatinine, Ser: 5.77 mg/dL — ABNORMAL HIGH (ref 0.44–1.00)
GFR calc Af Amer: 8 mL/min — ABNORMAL LOW (ref >60)
GFR calc non Af Amer: 7 mL/min — ABNORMAL LOW (ref >60)
Glucose, Bld: 111 mg/dL — ABNORMAL HIGH (ref 70–99)
Potassium: 3.7 mmol/L (ref 3.5–5.1)
Sodium: 139 mmol/L (ref 135–145)
Total Bilirubin: 0.7 mg/dL (ref 0.3–1.2)
Total Protein: 8.2 g/dL — ABNORMAL HIGH (ref 6.5–8.1)

## 2019-12-30 LAB — URINALYSIS, ROUTINE W REFLEX MICROSCOPIC
Bilirubin Urine: NEGATIVE
Glucose, UA: NEGATIVE mg/dL
Ketones, ur: NEGATIVE mg/dL
Nitrite: NEGATIVE
Protein, ur: 100 mg/dL — AB
Specific Gravity, Urine: 1.011 (ref 1.005–1.030)
pH: 5 (ref 5.0–8.0)

## 2019-12-30 LAB — CBC WITH DIFFERENTIAL/PLATELET
Abs Immature Granulocytes: 0.03 10*3/uL (ref 0.00–0.07)
Basophils Absolute: 0 10*3/uL (ref 0.0–0.1)
Basophils Relative: 0 %
Eosinophils Absolute: 0.1 10*3/uL (ref 0.0–0.5)
Eosinophils Relative: 1 %
HCT: 35.6 % — ABNORMAL LOW (ref 36.0–46.0)
Hemoglobin: 11.4 g/dL — ABNORMAL LOW (ref 12.0–15.0)
Immature Granulocytes: 0 %
Lymphocytes Relative: 26 %
Lymphs Abs: 2.1 10*3/uL (ref 0.7–4.0)
MCH: 27.6 pg (ref 26.0–34.0)
MCHC: 32 g/dL (ref 30.0–36.0)
MCV: 86.2 fL (ref 80.0–100.0)
Monocytes Absolute: 0.6 10*3/uL (ref 0.1–1.0)
Monocytes Relative: 8 %
Neutro Abs: 5.3 10*3/uL (ref 1.7–7.7)
Neutrophils Relative %: 65 %
Platelets: 383 10*3/uL (ref 150–400)
RBC: 4.13 MIL/uL (ref 3.87–5.11)
RDW: 14.6 % (ref 11.5–15.5)
WBC: 8.2 10*3/uL (ref 4.0–10.5)
nRBC: 0 % (ref 0.0–0.2)

## 2019-12-30 LAB — NA AND K (SODIUM & POTASSIUM), RAND UR
Potassium Urine: 12 mmol/L
Sodium, Ur: 17 mmol/L

## 2019-12-30 LAB — SARS CORONAVIRUS 2 BY RT PCR (HOSPITAL ORDER, PERFORMED IN ~~LOC~~ HOSPITAL LAB): SARS Coronavirus 2: POSITIVE — AB

## 2019-12-30 LAB — FERRITIN: Ferritin: 251 ng/mL (ref 11–307)

## 2019-12-30 LAB — LACTATE DEHYDROGENASE: LDH: 253 U/L — ABNORMAL HIGH (ref 98–192)

## 2019-12-30 LAB — D-DIMER, QUANTITATIVE: D-Dimer, Quant: 1.69 ug/mL-FEU — ABNORMAL HIGH (ref 0.00–0.50)

## 2019-12-30 LAB — LIPASE, BLOOD: Lipase: 34 U/L (ref 11–51)

## 2019-12-30 LAB — BRAIN NATRIURETIC PEPTIDE: B Natriuretic Peptide: 31.6 pg/mL (ref 0.0–100.0)

## 2019-12-30 LAB — SODIUM, URINE, RANDOM: Sodium, Ur: 17 mmol/L

## 2019-12-30 LAB — CREATININE, URINE, RANDOM: Creatinine, Urine: 180.13 mg/dL

## 2019-12-30 LAB — C-REACTIVE PROTEIN: CRP: 6.2 mg/dL — ABNORMAL HIGH (ref <1.0)

## 2019-12-30 MED ORDER — HEPARIN SODIUM (PORCINE) 5000 UNIT/ML IJ SOLN
5000.0000 [IU] | Freq: Three times a day (TID) | INTRAMUSCULAR | Status: DC
  Administered 2019-12-30 – 2020-01-02 (×10): 5000 [IU] via SUBCUTANEOUS
  Filled 2019-12-30 (×10): qty 1

## 2019-12-30 MED ORDER — GUAIFENESIN-DM 100-10 MG/5ML PO SYRP
10.0000 mL | ORAL_SOLUTION | ORAL | Status: DC | PRN
  Administered 2020-01-01: 10 mL via ORAL
  Filled 2019-12-30: qty 10

## 2019-12-30 MED ORDER — ASCORBIC ACID 500 MG PO TABS
500.0000 mg | ORAL_TABLET | Freq: Every day | ORAL | Status: DC
  Administered 2019-12-30 – 2020-01-04 (×6): 500 mg via ORAL
  Filled 2019-12-30 (×6): qty 1

## 2019-12-30 MED ORDER — OXYCODONE HCL 5 MG PO TABS
5.0000 mg | ORAL_TABLET | ORAL | Status: DC | PRN
  Filled 2019-12-30: qty 1

## 2019-12-30 MED ORDER — ONDANSETRON HCL 4 MG/2ML IJ SOLN
4.0000 mg | Freq: Once | INTRAMUSCULAR | Status: AC
  Administered 2019-12-30: 4 mg via INTRAVENOUS
  Filled 2019-12-30: qty 2

## 2019-12-30 MED ORDER — DILTIAZEM HCL 30 MG PO TABS: 30.0000 mg | ORAL_TABLET | Freq: Four times a day (QID) | ORAL | Status: DC

## 2019-12-30 MED ORDER — ONDANSETRON HCL 4 MG PO TABS: 4.0000 mg | ORAL_TABLET | Freq: Four times a day (QID) | ORAL | Status: DC | PRN

## 2019-12-30 MED ORDER — ACETAMINOPHEN 325 MG PO TABS
650.0000 mg | ORAL_TABLET | Freq: Four times a day (QID) | ORAL | Status: DC | PRN
  Administered 2019-12-31: 650 mg via ORAL
  Filled 2019-12-30: qty 2

## 2019-12-30 MED ORDER — ALUM & MAG HYDROXIDE-SIMETH 200-200-20 MG/5ML PO SUSP
30.0000 mL | Freq: Once | ORAL | Status: AC
  Administered 2019-12-30: 30 mL via ORAL
  Filled 2019-12-30: qty 30

## 2019-12-30 MED ORDER — IPRATROPIUM-ALBUTEROL 20-100 MCG/ACT IN AERS
1.0000 | INHALATION_SPRAY | Freq: Four times a day (QID) | RESPIRATORY_TRACT | Status: DC
  Administered 2019-12-30 – 2020-01-04 (×16): 1 via RESPIRATORY_TRACT
  Filled 2019-12-30 (×2): qty 4

## 2019-12-30 MED ORDER — HYDROCOD POLST-CPM POLST ER 10-8 MG/5ML PO SUER
5.0000 mL | Freq: Two times a day (BID) | ORAL | Status: DC | PRN
  Filled 2019-12-30: qty 5

## 2019-12-30 MED ORDER — PANTOPRAZOLE SODIUM 40 MG PO TBEC
40.0000 mg | DELAYED_RELEASE_TABLET | Freq: Every day | ORAL | Status: DC
  Administered 2019-12-30 – 2020-01-04 (×6): 40 mg via ORAL
  Filled 2019-12-30 (×6): qty 1

## 2019-12-30 MED ORDER — DILTIAZEM HCL 30 MG PO TABS
30.0000 mg | ORAL_TABLET | Freq: Four times a day (QID) | ORAL | Status: DC
  Administered 2019-12-30 – 2020-01-04 (×16): 30 mg via ORAL
  Filled 2019-12-30 (×20): qty 1

## 2019-12-30 MED ORDER — ONDANSETRON HCL 4 MG/2ML IJ SOLN
4.0000 mg | Freq: Four times a day (QID) | INTRAMUSCULAR | Status: DC | PRN
  Administered 2020-01-01: 4 mg via INTRAVENOUS
  Filled 2019-12-30: qty 2

## 2019-12-30 MED ORDER — SODIUM CHLORIDE 0.9 % IV BOLUS
1000.0000 mL | Freq: Once | INTRAVENOUS | Status: AC
  Administered 2019-12-30: 1000 mL via INTRAVENOUS

## 2019-12-30 MED ORDER — DEXAMETHASONE SODIUM PHOSPHATE 10 MG/ML IJ SOLN
6.0000 mg | Freq: Every day | INTRAMUSCULAR | Status: DC
  Administered 2019-12-30 – 2020-01-04 (×6): 6 mg via INTRAVENOUS
  Filled 2019-12-30 (×6): qty 1

## 2019-12-30 MED ORDER — ZOLPIDEM TARTRATE 5 MG PO TABS
5.0000 mg | ORAL_TABLET | Freq: Every evening | ORAL | Status: DC | PRN
  Filled 2019-12-30 (×2): qty 1

## 2019-12-30 MED ORDER — SODIUM CHLORIDE 0.9 % IV SOLN: INTRAVENOUS | Status: DC

## 2019-12-30 MED ORDER — ALBUTEROL SULFATE HFA 108 (90 BASE) MCG/ACT IN AERS
2.0000 | INHALATION_SPRAY | Freq: Once | RESPIRATORY_TRACT | Status: AC
  Administered 2019-12-30: 2 via RESPIRATORY_TRACT
  Filled 2019-12-30: qty 6.7

## 2019-12-30 MED ORDER — ZINC SULFATE 220 (50 ZN) MG PO CAPS
220.0000 mg | ORAL_CAPSULE | Freq: Every day | ORAL | Status: DC
  Administered 2019-12-30 – 2020-01-04 (×6): 220 mg via ORAL
  Filled 2019-12-30 (×6): qty 1

## 2019-12-30 MED ORDER — SODIUM CHLORIDE 0.9 % IV SOLN
200.0000 mg | Freq: Once | INTRAVENOUS | Status: AC
  Administered 2019-12-30: 200 mg via INTRAVENOUS
  Filled 2019-12-30: qty 200

## 2019-12-30 MED ORDER — SODIUM CHLORIDE 0.9 % IV SOLN
100.0000 mg | Freq: Every day | INTRAVENOUS | Status: AC
  Administered 2019-12-31 – 2020-01-03 (×4): 100 mg via INTRAVENOUS
  Filled 2019-12-30 (×4): qty 20

## 2019-12-30 NOTE — ED Triage Notes (Signed)
Pt reports that her family memebers have tested positive for COVID. Pt states for 2 weeks she has experienced diarrhea and cough. Pt denies CP, however experiences SHOB with movement. Pt denies N/V or fever.

## 2019-12-30 NOTE — ED Notes (Signed)
Patient ambulated around room with this RN. O2 saturation remained between 95-97%.

## 2019-12-30 NOTE — ED Provider Notes (Signed)
[place] DEPT Provider Note   CSN: [id number] Arrival date & time: 12/30/19  1159     History Chief Complaint  Patient presents with  . Cough  . Diarrhea    Brittany Charles is a 69 y.o. female.  HPI   69 year old female with a history of arthritis, CHF, fibroids, hypertension, lupus,, MI, CKD, who presents to the emergency department today for evaluation of cough and diarrhea.  Patient states she has had a productive cough for the last 2 weeks.  She is also having diarrhea almost every time she eats.  She denies any bloody stools.  She has had intermittent abdominal pain to the right lower quadrant.  Denies any pain currently.  She has not had any nausea.  She has had some posttussive emesis.  She denies any chest pain.  She is complaining of some shortness of breath. Denies any hemoptysis, ble swelling.   States that she has multiple family members who have tested positive for Covid.  She has been fully vaccinated.  Past Medical History:  Diagnosis Date  . Arthritis   . CHF (congestive heart failure) ([place])   . Fibroids   . Hypertension   . Lupus (systemic lupus erythematosus) ([place])   . Myocardial infarction ([place])   . Renal insufficiency     Patient Active Problem List   Diagnosis Date Noted  . AKI (acute kidney injury) ([place]) 12/30/2019  . Colitis   . Hematochezia 01/11/2017  . Fatigue 09/17/2014  . Systolic CHF ([place]) 81/44/8185  . CKD (chronic kidney disease), stage III 04/22/2011  . Hypertension 04/17/2011  . Gout 04/17/2011  . Lupus (systemic lupus erythematosus) ([place]) 04/17/2011    Past Surgical History:  Procedure Laterality Date  . ABDOMINAL HYSTERECTOMY  10/27/2000   TAH, BSO  . LEFT AND RIGHT HEART CATHETERIZATION WITH CORONARY ANGIOGRAM Right 04/21/2011   Procedure: LEFT AND RIGHT HEART CATHETERIZATION WITH CORONARY ANGIOGRAM;  Surgeon: [name], MD;  Location: [facility] CATH LAB;  Service: Cardiovascular;  Laterality:  Right;  . TONSILLECTOMY       OB History   No obstetric history on file.     Family History  Problem Relation Age of Onset  . Hypertension Mother   . Heart disease Mother        CHF  . Stroke Mother   . Hyperlipidemia Father   . Stroke Father   . Hypertension Father   . Stroke Maternal Grandmother   . Colon cancer Unknown   . Breast cancer Unknown     Social History   Tobacco Use  . Smoking status: Never Smoker  . Smokeless tobacco: Never Used  Vaping Use  . Vaping Use: Never used  Substance Use Topics  . Alcohol use: No  . Drug use: No    Home Medications Prior to Admission medications   Medication Sig Start Date End Date Taking? Authorizing Provider  acetaminophen (TYLENOL) 325 MG tablet Take 650 mg by mouth as needed for mild pain or headache.    Yes [provider]  allopurinol (ZYLOPRIM) 100 MG tablet TAKE 1 TABLET EVERY DAY Patient taking differently: Take 100 mg by mouth daily.  10/10/15  Yes Posey Boyer, MD  calcitRIOL (ROCALTROL) 0.25 MCG capsule Take 0.125 mcg by mouth daily. 12/15/19  Yes [provider]  COLCRYS 0.6 MG tablet Take 0.6 mg by mouth as needed (gout flareups).  07/28/18  Yes [provider]  diltiazem (CARDIZEM CD) 300 MG 24 hr capsule Take  300 mg by mouth daily. 08/20/18  Yes [provider]  furosemide (LASIX) 80 MG tablet Take 80 mg by mouth daily. 06/30/18  Yes [provider]  hydrALAZINE (APRESOLINE) 50 MG tablet TAKE 1 TABLET THREE TIMES DAILY Patient taking differently: Take 50 mg by mouth 3 (three) times daily.  10/10/15  Yes Posey Boyer, MD    Allergies    Zithromax [azithromycin]  Review of Systems   Review of Systems  Constitutional: Negative for fever.  HENT: Negative for ear pain and sore throat.   Eyes: Negative for pain and visual disturbance.  Respiratory: Positive for cough and shortness of breath.   Cardiovascular: Negative for chest pain and leg swelling.   Gastrointestinal: Positive for abdominal pain (currently resolved) and diarrhea. Negative for constipation, nausea and vomiting.  Genitourinary: Negative for dysuria and hematuria.  Musculoskeletal: Negative for back pain.  Skin: Negative for rash.  Neurological: Negative for seizures, syncope and headaches.  All other systems reviewed and are negative.   Physical Exam Updated Vital Signs BP (!) 147/59   Pulse 72   Temp 98.5 F (36.9 C) (Oral)   Resp 15   Ht 5\' 5"  (1.651 m)   Wt 90 kg   SpO2 91%   BMI 33.02 kg/m   Physical Exam Vitals and nursing note reviewed.  Constitutional:      General: She is not in acute distress.    Appearance: She is well-developed.  HENT:     Head: Normocephalic and atraumatic.     Mouth/Throat:     Mouth: Mucous membranes are dry.  Eyes:     Conjunctiva/sclera: Conjunctivae normal.  Cardiovascular:     Rate and Rhythm: Normal rate and regular rhythm.     Heart sounds: Normal heart sounds. No murmur heard.   Pulmonary:     Effort: Pulmonary effort is normal. No respiratory distress.     Breath sounds: No wheezing, rhonchi or rales.     Comments: Dry cough on exam Abdominal:     General: Bowel sounds are normal.     Palpations: Abdomen is soft.     Tenderness: There is no abdominal tenderness. There is no guarding or rebound.  Musculoskeletal:        General: No tenderness.     Cervical back: Neck supple.     Right lower leg: No edema.     Left lower leg: No edema.  Skin:    General: Skin is warm and dry.  Neurological:     Mental Status: She is alert.     ED Results / Procedures / Treatments   Labs (all labs ordered are listed, but only abnormal results are displayed) Labs Reviewed  SARS CORONAVIRUS 2 BY RT PCR ([facility]) - Abnormal; Notable for the following components:      Result Value   SARS Coronavirus 2 POSITIVE (*)    All other components within normal limits  CBC WITH  DIFFERENTIAL/PLATELET - Abnormal; Notable for the following components:   Hemoglobin 11.4 (*)    HCT 35.6 (*)    All other components within normal limits  COMPREHENSIVE METABOLIC PANEL - Abnormal; Notable for the following components:   CO2 18 (*)    Glucose, Bld 111 (*)    BUN 98 (*)    Creatinine, Ser 5.77 (*)    Calcium 8.7 (*)    Total Protein 8.2 (*)    Albumin 3.3 (*)    GFR calc  non Af Amer 7 (*)    GFR calc Af Amer 8 (*)    Anion gap 18 (*)    All other components within normal limits  LIPASE, BLOOD  URINALYSIS, ROUTINE W REFLEX MICROSCOPIC  NA AND K (SODIUM & POTASSIUM), RAND UR  CREATININE, URINE, 24 HOUR    EKG None  Radiology DG Chest Portable 1 View  Result Date: 12/30/2019 CLINICAL DATA:  Cough, COVID-19 exposure EXAM: PORTABLE CHEST 1 VIEW COMPARISON:  04/17/2011 chest radiograph. FINDINGS: Stable cardiomediastinal silhouette with normal heart size. No pneumothorax. No pleural effusion. Faint patchy hazy opacities in the peripheral left mid lung and lower right lung. No pulmonary edema. IMPRESSION: Faint patchy hazy opacities in the peripheral left mid lung and lower right lung, cannot exclude atypical/viral pneumonia, although the differential includes mild scarring or atelectasis. Electronically Signed   By: Ilona Sorrel M.D.   On: 12/30/2019 13:45    Procedures Procedures (including critical care time)  Medications Ordered in ED Medications  sodium chloride 0.9 % bolus 1,000 mL (0 mLs Intravenous Stopped 12/30/19 1435)  ondansetron (ZOFRAN) injection 4 mg (4 mg Intravenous Given 12/30/19 1255)  alum & mag hydroxide-simeth (MAALOX/MYLANTA) 200-200-20 MG/5ML suspension 30 mL (30 mLs Oral Given 12/30/19 1254)  albuterol (VENTOLIN HFA) 108 (90 Base) MCG/ACT inhaler 2 puff (2 puffs Inhalation Given 12/30/19 1323)    ED Course  I have reviewed the triage vital signs and the nursing notes.  Pertinent labs & imaging results that were available during my care of the  patient were reviewed by me and considered in my medical decision making (see chart for details).  Clinical Course as of Dec 29 1521  Sat Dec 29, 1844  [id number] 69 year old female presented to emergency department with nausea and diarrhea for 2 weeks.  She has family members that tested positive for Covid.  She herself has been vaccinated.  On exam she is overall comfortable appearing.  She has no focal abdominal tenderness or distention discussing acute infectious or inflammatory intra-abdominal process.  She has no leukocytosis or fever.  Her labs are notable for an AKI with a creatinine of 2.7, doubled from her previous, and an elevated BUN suggestive of prerenal etiology.  The patient will be given fluids and admitted to the hospital for an AKI.  Her Covid test and UA are still pending.  I do not believe she needs an emergent CT scan in the ED   [MT]    Clinical Course User Index [MT] Trifan, Carola Rhine, MD   MDM Rules/Calculators/A&P                          69 year old female presenting for evaluation of cough and diarrhea.  Has been around multiple Covid positive family members.  She is fully vaccinated.  Patient satting well on room air hemodynamically stable.  She was ambulated with pulse oximetry and maintained sats at 95% and above on room air.  Reviewed/interpreted labs CBC is without leukocytosis, mild anemia present consistent with prior CMP with low bicarb at 18, elevated BUN/creatinine at 98/5.77 which is doubled from her baseline of about 2-2.8.  LFTs are normal.   Lipase unremarkable UA pending at the time of admission COVID positive  Chest x-ray reviewed/interpreted and is consistent with atypical/viral pneumonia  3:20 PM CONSULT with Dr. Cyndia Skeeters who accepts patient for admission.    Final Clinical Impression(s) / ED Diagnoses Final diagnoses:  AKI (acute kidney injury) ([place])  COVID-19  Rx / DC Orders ED Discharge Orders    None       Bishop Dublin 12/30/19 1523    Wyvonnia Dusky, MD 12/31/19 [phone]

## 2019-12-30 NOTE — ED Notes (Signed)
Phlebotomy contacted to obtain labs on patient

## 2019-12-30 NOTE — H&P (Signed)
History and Physical    Brittany Charles JEH:[id number] DOB: Dec 22, 1950 DOA: 12/30/2019  PCP: Wendie Agreste, MD Patient coming from: Home.  Chief Complaint: Weakness  HPI: Brittany Charles is a 69 y.o. female with history of CKD-4, unknown type CHF, CAD, HTN, lupus, gout and arthritis presenting with generalized weakness, productive cough and diarrhea.  Patient reports cough for the last 2 weeks.  Cough was initially dry but now productive with whitish phlegm. She developed watery diarrhea earlier this morning.  Also felt very weak and lightheaded that prompted her to come to ED.  She denies fever, chills, hemoptysis, denies chest pain, dyspnea, orthopnea, PND or edema.  She denies melena or hematochezia.  She reports intermittent right sided abdominal pain.  Pain is very brief like a flash.  Described the pain as sharp.  Sometimes with cough.  She denies nausea or vomiting but reports dysphagia with solids.  She denies odynophagia.  She had nasal congestion for few days.  She says she had multiple family members who tested positive for COVID-19 recently.  She is fully vaccinated against COVID-19.  She had both shots of Pfizer in February 2021.  She denies headache, vision change, myalgia, loss of taste or smell, UTI symptoms or focal neuro symptoms.  She has not noticed changes to urine output.  She has not taken her Lasix in the last 2 to 3 days.  She denies NSAID use.  Patient is followed by Dr. Johnney Ou from [facility].  Patient denies smoking cigarettes, drinking alcohol recreational drug use.  Wishes to remain full code.  In ED, COVID-19 PCR positive.  SBP in the range of 140s to 150s.  Otherwise hemodynamically stable.  Saturating in upper 90s on room air. Cr 5.77 (2.89 and 11/2018).  BUN 98.  K3.7.  Bicarb 18.  Anion gap 18.  Hgb 11.4.  Lipase was normal.  Otherwise, CMP and CBC without significant finding.  Chest x-ray concerning for slight bilateral infiltrate.  Patient was  started on Decadron and IV fluid.  Urinalysis and inflammatory markers ordered.  Hospitalist service called for admission.   ROS All review of system negative except for pertinent positives and negatives as history of present illness above. PMH Past Medical History:  Diagnosis Date  . Arthritis   . CHF (congestive heart failure) ([place])   . Fibroids   . Hypertension   . Lupus (systemic lupus erythematosus) ([place])   . Myocardial infarction ([place])   . Renal insufficiency    PSH Past Surgical History:  Procedure Laterality Date  . ABDOMINAL HYSTERECTOMY  10/27/2000   TAH, BSO  . LEFT AND RIGHT HEART CATHETERIZATION WITH CORONARY ANGIOGRAM Right 04/21/2011   Procedure: LEFT AND RIGHT HEART CATHETERIZATION WITH CORONARY ANGIOGRAM;  Surgeon: [name], MD;  Location: [facility] CATH LAB;  Service: Cardiovascular;  Laterality: Right;  . TONSILLECTOMY     Fam HX Family History  Problem Relation Age of Onset  . Hypertension Mother   . Heart disease Mother        CHF  . Stroke Mother   . Hyperlipidemia Father   . Stroke Father   . Hypertension Father   . Stroke Maternal Grandmother   . Colon cancer Unknown   . Breast cancer Unknown     Social Hx  reports that she has never smoked. She has never used smokeless tobacco. She reports that she does not drink alcohol and does not use drugs.  Allergy Allergies  Allergen Reactions  . Zithromax [  Azithromycin] Nausea And Vomiting   Home Meds Prior to Admission medications   Medication Sig Start Date End Date Taking? Authorizing Provider  acetaminophen (TYLENOL) 325 MG tablet Take 650 mg by mouth as needed for mild pain or headache.    Yes [provider]  allopurinol (ZYLOPRIM) 100 MG tablet TAKE 1 TABLET EVERY DAY Patient taking differently: Take 100 mg by mouth daily.  10/10/15  Yes Posey Boyer, MD  calcitRIOL (ROCALTROL) 0.25 MCG capsule Take 0.125 mcg by mouth daily. 12/15/19  Yes [provider]  COLCRYS 0.6 MG tablet  Take 0.6 mg by mouth as needed (gout flareups).  07/28/18  Yes [provider]  diltiazem (CARDIZEM CD) 300 MG 24 hr capsule Take 300 mg by mouth daily. 08/20/18  Yes [provider]  furosemide (LASIX) 80 MG tablet Take 80 mg by mouth daily. 06/30/18  Yes [provider]  hydrALAZINE (APRESOLINE) 50 MG tablet TAKE 1 TABLET THREE TIMES DAILY Patient taking differently: Take 50 mg by mouth 3 (three) times daily.  10/10/15  Yes Posey Boyer, MD    Physical Exam: Vitals:   12/30/19 1300 12/30/19 1323 12/30/19 1326 12/30/19 1330  BP: (!) 151/72   (!) 147/59  Pulse: 67   72  Resp: 17   15  Temp:      TempSrc:      SpO2: 97% 97%  91%  Weight:   90 kg   Height:   5\' 5"  (1.651 m)     GENERAL: No acute distress.  Appears well.  HEENT: MMM.  Vision and hearing grossly intact.  NECK: Supple.  No apparent JVD.  RESP: On RA.  No IWOB. Good air movement bilaterally. CVS:  RRR. Heart sounds normal.  ABD/GI/GU: Bowel sounds present. Soft. Non tender.  MSK/EXT:  Moves extremities. No apparent deformity or edema.  SKIN: no apparent skin lesion or wound NEURO: Awake, alert and oriented appropriately.  No gross deficit.  PSYCH: Calm. Normal affect.   Personally Reviewed Radiological Exams DG Chest Portable 1 View  Result Date: 12/30/2019 CLINICAL DATA:  Cough, COVID-19 exposure EXAM: PORTABLE CHEST 1 VIEW COMPARISON:  04/17/2011 chest radiograph. FINDINGS: Stable cardiomediastinal silhouette with normal heart size. No pneumothorax. No pleural effusion. Faint patchy hazy opacities in the peripheral left mid lung and lower right lung. No pulmonary edema. IMPRESSION: Faint patchy hazy opacities in the peripheral left mid lung and lower right lung, cannot exclude atypical/viral pneumonia, although the differential includes mild scarring or atelectasis. Electronically Signed   By: Ilona Sorrel M.D.   On: 12/30/2019 13:45     Personally Reviewed Labs: CBC: Recent Labs  Lab  12/30/19 1217  WBC 8.2  NEUTROABS 5.3  HGB 11.4*  HCT 35.6*  MCV 86.2  PLT 701   Basic Metabolic Panel: Recent Labs  Lab 12/30/19 1217  NA 139  K 3.7  CL 103  CO2 18*  GLUCOSE 111*  BUN 98*  CREATININE 5.77*  CALCIUM 8.7*   GFR: Estimated Creatinine Clearance: 10.3 mL/min (A) (by C-G formula based on SCr of 5.77 mg/dL (H)). Liver Function Tests: Recent Labs  Lab 12/30/19 1217  AST 19  ALT 14  ALKPHOS 90  BILITOT 0.7  PROT 8.2*  ALBUMIN 3.3*   Recent Labs  Lab 12/30/19 1217  LIPASE 34   No results for input(s): AMMONIA in the last 168 hours. Coagulation Profile: No results for input(s): INR, PROTIME in the last 168 hours. Cardiac Enzymes: No results for input(s): CKTOTAL,  CKMB, CKMBINDEX, TROPONINI in the last 168 hours. BNP (last 3 results) No results for input(s): PROBNP in the last 8760 hours. HbA1C: No results for input(s): HGBA1C in the last 72 hours. CBG: No results for input(s): GLUCAP in the last 168 hours. Lipid Profile: No results for input(s): CHOL, HDL, LDLCALC, TRIG, CHOLHDL, LDLDIRECT in the last 72 hours. Thyroid Function Tests: No results for input(s): TSH, T4TOTAL, FREET4, T3FREE, THYROIDAB in the last 72 hours. Anemia Panel: No results for input(s): VITAMINB12, FOLATE, FERRITIN, TIBC, IRON, RETICCTPCT in the last 72 hours. Urine analysis:    Component Value Date/Time   COLORURINE STRAW (A) 01/11/2017 1338   APPEARANCEUR CLEAR 01/11/2017 1338   LABSPEC 1.010 01/11/2017 1338   PHURINE 5.0 01/11/2017 1338   GLUCOSEU NEGATIVE 01/11/2017 1338   HGBUR SMALL (A) 01/11/2017 1338   BILIRUBINUR NEGATIVE 01/11/2017 1338   BILIRUBINUR neg 09/12/2014 1612   KETONESUR NEGATIVE 01/11/2017 1338   PROTEINUR 100 (A) 01/11/2017 1338   UROBILINOGEN 0.2 09/12/2014 1612   UROBILINOGEN 0.2 10/01/2012 1845   NITRITE NEGATIVE 01/11/2017 1338   LEUKOCYTESUR NEGATIVE 01/11/2017 1338    Sepsis Labs:  None  Personally Reviewed EKG:  12-lead EKG  pending.  Assessment/Plan Acute kidney injury on CKD-4 with azotemia-suspect prerenal etiology in the setting of poor p.o. intake, diarrhea and diuretics.  Could have some degree of ATN from COVID-19 infection.  Patient is followed by Dr. Johnney Ou at [facility] Cr 5.77 (2.89 in 11/2018).  BUN 98.  GI symptoms could be due to Covid or uremia. -Continue IV NS at 125 cc an hour for the next 24 hours. -Renal ultrasound, urine chemistry, bladder scan -Monitor urine output. -Hold home Lasix -Nephrology to see patient in the morning. Discussed with Dr. Ala Bent gap metabolic acidosis: Bicarb 18.  Anion gap 18.  Likely due to azotemia/renal failure. -Manage as above. -Continue monitoring  COVID-19 pneumonia-symptomatic for about 2 weeks.  Maintaining good saturation on room air.  CXR with bilateral infiltrates.  Inflammatory markers pending.  She completed 2 series pfizer vaccine in February 2021. -Continue Decadron.  I will add remdesivir -Monitor inflammatory markers -Incentive spirometry, OOB/PT/OT -Mucolytic's and antitussives  Chronic CHF-unknown type.  No CHF symptoms.  She is on Lasix at home. -Monitor fluid status while on IV fluid. -Hold home Lasix  Diarrhea: Could be due to COVID-19 or uremia.  Abdominal exam is benign. -IV fluid as above  Dysphagia with solids -May need barium swallow eval if it does not improve with treatment of renal failure and Covid.  Generalized weakness/lightheadedness-likely due to dehydration, renal failure and COVID-19. -Treat treatable causes -PT/OT eval  History of PSVT: She is now sinus rhythm at 70.  On Cardizem CD 360 mg at home. -Will use Cardizem 30 mg every 6 hours  Essential hypertension: BP within fair range. -Hold home antihypertensive medications -P.o. Cardizem as above  Anemia of renal disease: -Monitor H&H.  History of diabetes?-Does not take medications.  May be diet controlled. -Check hemoglobin A1c  History of lupus?  Does not  seem to be on medication.   DVT prophylaxis: Subcu heparin  Code Status: Full code Family Communication: Updated patient's sister over the phone.  Disposition Plan: Admit to MedSurg Consults called: Nephrology Admission status: Inpatient  Severity of Illness: The appropriate patient status for this patient is INPATIENT. Inpatient status is judged to be reasonable and necessary in order to provide the required intensity of service to ensure the patient's safety. The patient's presenting symptoms, physical exam  findings, and initial radiographic and laboratory data in the context of their chronic comorbidities is felt to place them at high risk for further clinical deterioration. Furthermore, it is not anticipated that the patient will be medically stable for discharge from the hospital within 2 midnights of admission. The following factors support the patient status of inpatient.    "           The patient's presenting symptoms include cough, dysphagia, diarrhea, abdominal pain, lightheadedness and weakness "           The worrisome physical exam findings include weakness "           The initial radiographic and laboratory data are worrisome because bilateral infiltrates on chest x-ray, elevated creatinine (doubled from baseline), significant azotemia and metabolic acidosis "           The chronic co-morbidities include CKD-4, CHF, CAD, PSVT, DM-2, lupus     I certify that at the point of admission it is my clinical judgment that the patient will require inpatient hospital care spanning beyond 2 midnights from the point of admission due to high intensity of service, high risk for further deterioration and high frequency of surveillance required.   Mercy Riding MD [facility]  If 7PM-7AM, please contact night-coverage [email or web address]  12/30/2019, 3:57 PM

## 2019-12-31 ENCOUNTER — Inpatient Hospital Stay (HOSPITAL_COMMUNITY): Payer: Medicare HMO

## 2019-12-31 LAB — CBC WITH DIFFERENTIAL/PLATELET
Abs Immature Granulocytes: 0.02 10*3/uL (ref 0.00–0.07)
Basophils Absolute: 0 10*3/uL (ref 0.0–0.1)
Basophils Relative: 0 %
Eosinophils Absolute: 0 10*3/uL (ref 0.0–0.5)
Eosinophils Relative: 0 %
HCT: 38.7 % (ref 36.0–46.0)
Hemoglobin: 12 g/dL (ref 12.0–15.0)
Immature Granulocytes: 1 %
Lymphocytes Relative: 20 %
Lymphs Abs: 0.7 10*3/uL (ref 0.7–4.0)
MCH: 26.8 pg (ref 26.0–34.0)
MCHC: 31 g/dL (ref 30.0–36.0)
MCV: 86.4 fL (ref 80.0–100.0)
Monocytes Absolute: 0.1 10*3/uL (ref 0.1–1.0)
Monocytes Relative: 4 %
Neutro Abs: 2.7 10*3/uL (ref 1.7–7.7)
Neutrophils Relative %: 75 %
Platelets: 385 10*3/uL (ref 150–400)
RBC: 4.48 MIL/uL (ref 3.87–5.11)
RDW: 14.7 % (ref 11.5–15.5)
WBC: 3.6 10*3/uL — ABNORMAL LOW (ref 4.0–10.5)
nRBC: 0 % (ref 0.0–0.2)

## 2019-12-31 LAB — COMPREHENSIVE METABOLIC PANEL
ALT: 13 U/L (ref 0–44)
AST: 18 U/L (ref 15–41)
Albumin: 3.1 g/dL — ABNORMAL LOW (ref 3.5–5.0)
Alkaline Phosphatase: 96 U/L (ref 38–126)
Anion gap: 13 (ref 5–15)
BUN: 77 mg/dL — ABNORMAL HIGH (ref 8–23)
CO2: 14 mmol/L — ABNORMAL LOW (ref 22–32)
Calcium: 8.7 mg/dL — ABNORMAL LOW (ref 8.9–10.3)
Chloride: 113 mmol/L — ABNORMAL HIGH (ref 98–111)
Creatinine, Ser: 4.17 mg/dL — ABNORMAL HIGH (ref 0.44–1.00)
GFR calc Af Amer: 12 mL/min — ABNORMAL LOW (ref >60)
GFR calc non Af Amer: 10 mL/min — ABNORMAL LOW (ref >60)
Glucose, Bld: 125 mg/dL — ABNORMAL HIGH (ref 70–99)
Potassium: 4.3 mmol/L (ref 3.5–5.1)
Sodium: 140 mmol/L (ref 135–145)
Total Bilirubin: 0.4 mg/dL (ref 0.3–1.2)
Total Protein: 8.4 g/dL — ABNORMAL HIGH (ref 6.5–8.1)

## 2019-12-31 LAB — C-REACTIVE PROTEIN: CRP: 6 mg/dL — ABNORMAL HIGH (ref <1.0)

## 2019-12-31 LAB — MAGNESIUM: Magnesium: 2.5 mg/dL — ABNORMAL HIGH (ref 1.7–2.4)

## 2019-12-31 LAB — ABO/RH: ABO/RH(D): B POS

## 2019-12-31 LAB — PHOSPHORUS: Phosphorus: 5.4 mg/dL — ABNORMAL HIGH (ref 2.5–4.6)

## 2019-12-31 LAB — D-DIMER, QUANTITATIVE: D-Dimer, Quant: 1.9 ug/mL-FEU — ABNORMAL HIGH (ref 0.00–0.50)

## 2019-12-31 LAB — FERRITIN: Ferritin: 243 ng/mL (ref 11–307)

## 2019-12-31 LAB — HEMOGLOBIN A1C
Hgb A1c MFr Bld: 6.5 % — ABNORMAL HIGH (ref 4.8–5.6)
Mean Plasma Glucose: 139.85 mg/dL

## 2019-12-31 MED ORDER — SODIUM BICARBONATE 8.4 % IV SOLN
INTRAVENOUS | Status: DC
  Filled 2019-12-31 (×3): qty 150

## 2019-12-31 MED ORDER — SODIUM CHLORIDE 0.9 % IV SOLN
1.0000 g | INTRAVENOUS | Status: DC
  Administered 2019-12-31 – 2020-01-02 (×3): 1 g via INTRAVENOUS
  Filled 2019-12-31 (×2): qty 10
  Filled 2019-12-31: qty 1

## 2019-12-31 MED ORDER — SODIUM BICARBONATE 8.4 % IV SOLN
INTRAVENOUS | Status: DC
  Filled 2019-12-31: qty 100

## 2019-12-31 MED ORDER — HYDRALAZINE HCL 50 MG PO TABS
50.0000 mg | ORAL_TABLET | Freq: Three times a day (TID) | ORAL | Status: DC
  Administered 2019-12-31 – 2020-01-04 (×13): 50 mg via ORAL
  Filled 2019-12-31 (×2): qty 1
  Filled 2019-12-31: qty 2
  Filled 2019-12-31: qty 1
  Filled 2019-12-31: qty 2
  Filled 2019-12-31 (×2): qty 1
  Filled 2019-12-31: qty 2
  Filled 2019-12-31 (×4): qty 1
  Filled 2019-12-31: qty 2

## 2019-12-31 NOTE — ED Notes (Signed)
RN went in to round on patient. Patient's purewick container emptied into 24 hour urine collection container that is on ice. Patient assisted into a comfortable position to eat her lunch. Patient denies any other needs at this time. Environment tidied and patient aware of plan of care.

## 2019-12-31 NOTE — ED Notes (Signed)
RT called and IS and flutter valve ordered

## 2019-12-31 NOTE — ED Notes (Signed)
Pt was on purewick, pt reports that she is wet.  NT changed pt and placed on new pure wick, continued saving of urine for 24hour urine test.  Pt was given ice water and coffee.  Gave pt IS and flutter valve and did teaching.  Pt demonstrated use and did both 10x, advised pt to do 10X each hour.

## 2019-12-31 NOTE — ED Notes (Signed)
Called lab for jug for 24hour urine creatnine.

## 2019-12-31 NOTE — Consult Note (Signed)
Nephrology Consult   Requesting provider: Shawna Clamp Service requesting consult: Hospitalist Reason for consult: AKI   Assessment/Recommendations: Brittany Charles is a/an 69 y.o. female with a past medical history CKD, CHF, CAD, HTN, gout who present w/ COVID and AKI   Non-Oliguric AKI on CKD 4: Baseline creatinine based on labs from our office is 3-3.5.  Likely related to dehydration with possible ATN related to Covid.  Urinalysis with protein similar to the past.  Small hemoglobin and only 6-10 RBCs.  Urine sodium relatively low at 17 consistent with dehydration -Switch IV fluids to sodium bicarbonate 125 cc/h -Continue to encourage oral hydration -Monitor creatinine daily -Monitor daily weights and ins and outs -Avoid nephrotoxic medications  Hyperchloremic non-anion gap metabolic acidosis: Related to diarrhea and AKI.  Also some iatrogenic component from chloride load it related to normal saline. -Switch fluids to sodium bicarb 125 cc/h -Consider addition of oral bicarbonate if necessary  Hypertension: Markedly hypertensive since admission likely exacerbated by steroid.  On hydralazine 50 mg 3 times daily at home as well as Cardizem 300 mg daily.  C start back on hydralazine 50 mg 3 times daily.  Can increase Cardizem to home dose as necessary  COVID-19 infection: Management per primary team   Recommendations conveyed to primary service.    [facility] 12/31/2019 2:51 PM   _____________________________________________________________________________________ CC: weakness  History of Present Illness: Brittany Charles is a/an 69 y.o. female with a past medical history of CKD, CHF, CAD, HTN, gout who presents with weakness.  Patient presented to [facility] emergency department yesterday after having cough for 2 weeks.  It was initially dry but has become more productive.  She also has had diarrhea.  She has also had worsening weakness.   She has intermittently had some abdominal pain.  She has had difficulty keeping some food down.  She is fully vaccinated against Covid.  On arrival to the emergency department she was found to be Covid positive.  She was not hypotensive.  She was satting well on room air.  Creatinine was 5.77 with a bicarb of 18.  She was given IV fluids 1 L of normal saline and started on a normal saline infusion at 125 cc/h.  Her creatinine is improved to 4.2 but her bicarb is 14 this morning with no anion gap.  Patient states she feels much better today. Previously she was having multiple episodes of diarrhea and couldn't eat or drink but now able to take fluids and is about to eat a meal. Wants to go home.   Medications:  Current Facility-Administered Medications  Medication Dose Route Frequency Provider Last Rate Last Admin  . 0.9 %  sodium chloride infusion  500 mL Intravenous Continuous Nandigam, Kavitha V, MD      . 0.9 %  sodium chloride infusion   Intravenous Continuous Mercy Riding, MD   Stopped at 12/31/19 1144  . acetaminophen (TYLENOL) tablet 650 mg  650 mg Oral Q6H PRN Gonfa, Taye T, MD      . ascorbic acid (VITAMIN C) tablet 500 mg  500 mg Oral Daily Gonfa, Taye T, MD   500 mg at 12/31/19 0900  . cefTRIAXone (ROCEPHIN) 1 g in sodium chloride 0.9 % 100 mL IVPB  1 g Intravenous Q24H Shawna Clamp, MD   Stopped at 12/31/19 0945  . chlorpheniramine-HYDROcodone (TUSSIONEX) 10-8 MG/5ML suspension 5 mL  5 mL Oral Q12H PRN Mercy Riding, MD      .  dexamethasone (DECADRON) injection 6 mg  6 mg Intravenous Daily Wendee Beavers T, MD   6 mg at 12/31/19 0900  . diltiazem (CARDIZEM) tablet 30 mg  30 mg Oral Q6H Gonfa, Taye T, MD   30 mg at 12/31/19 1142  . guaiFENesin-dextromethorphan (ROBITUSSIN DM) 100-10 MG/5ML syrup 10 mL  10 mL Oral Q4H PRN Wendee Beavers T, MD      . heparin injection 5,000 Units  5,000 Units Subcutaneous Q8H Mercy Riding, MD   5,000 Units at 12/31/19 0605  . Ipratropium-Albuterol  (COMBIVENT) respimat 1 puff  1 puff Inhalation Q6H Mercy Riding, MD   1 puff at 12/31/19 0901  . ondansetron (ZOFRAN) tablet 4 mg  4 mg Oral Q6H PRN Gonfa, Taye T, MD       Or  . ondansetron (ZOFRAN) injection 4 mg  4 mg Intravenous Q6H PRN Gonfa, Taye T, MD      . oxyCODONE (Oxy IR/ROXICODONE) immediate release tablet 5 mg  5 mg Oral Q4H PRN Gonfa, Taye T, MD      . pantoprazole (PROTONIX) EC tablet 40 mg  40 mg Oral Daily Wendee Beavers T, MD   40 mg at 12/31/19 0901  . remdesivir 100 mg in sodium chloride 0.9 % 100 mL IVPB  100 mg Intravenous Daily Lenis Noon, [facility]   Stopped at 12/31/19 1610  . zinc sulfate capsule 220 mg  220 mg Oral Daily Gonfa, Taye T, MD   220 mg at 12/31/19 0900  . zolpidem (AMBIEN) tablet 5 mg  5 mg Oral QHS PRN Mercy Riding, MD       Current Outpatient Medications  Medication Sig Dispense Refill  . acetaminophen (TYLENOL) 325 MG tablet Take 650 mg by mouth as needed for mild pain or headache.     . allopurinol (ZYLOPRIM) 100 MG tablet TAKE 1 TABLET EVERY DAY (Patient taking differently: Take 100 mg by mouth daily. ) 90 tablet 0  . calcitRIOL (ROCALTROL) 0.25 MCG capsule Take 0.125 mcg by mouth daily.    Marland Kitchen COLCRYS 0.6 MG tablet Take 0.6 mg by mouth as needed (gout flareups).     . diltiazem (CARDIZEM CD) 300 MG 24 hr capsule Take 300 mg by mouth daily.    . furosemide (LASIX) 80 MG tablet Take 80 mg by mouth daily.    . hydrALAZINE (APRESOLINE) 50 MG tablet TAKE 1 TABLET THREE TIMES DAILY (Patient taking differently: Take 50 mg by mouth 3 (three) times daily. ) 270 tablet 0     ALLERGIES Zithromax [azithromycin]  MEDICAL HISTORY Past Medical History:  Diagnosis Date  . Arthritis   . CHF (congestive heart failure) ([place])   . Fibroids   . Hypertension   . Lupus (systemic lupus erythematosus) ([place])   . Myocardial infarction ([place])   . Renal insufficiency      SOCIAL HISTORY Social History   Socioeconomic History  . Marital status: Single    Spouse name:  Not on file  . Number of children: Not on file  . Years of education: Not on file  . Highest education level: Not on file  Occupational History  . Not on file  Tobacco Use  . Smoking status: Never Smoker  . Smokeless tobacco: Never Used  Vaping Use  . Vaping Use: Never used  Substance and Sexual Activity  . Alcohol use: No  . Drug use: No  . Sexual activity: Never  Other Topics Concern  . Not on file  Social History  Narrative  . Not on file   Social Determinants of Health   Financial Resource Strain:   . Difficulty of Paying Living Expenses: Not on file  Food Insecurity:   . Worried About Charity fundraiser in the Last Year: Not on file  . Ran Out of Food in the Last Year: Not on file  Transportation Needs:   . Lack of Transportation (Medical): Not on file  . Lack of Transportation (Non-Medical): Not on file  Physical Activity:   . Days of Exercise per Week: Not on file  . Minutes of Exercise per Session: Not on file  Stress:   . Feeling of Stress : Not on file  Social Connections:   . Frequency of Communication with Friends and Family: Not on file  . Frequency of Social Gatherings with Friends and Family: Not on file  . Attends Religious Services: Not on file  . Active Member of Clubs or Organizations: Not on file  . Attends Archivist Meetings: Not on file  . Marital Status: Not on file  Intimate Partner Violence:   . Fear of Current or Ex-Partner: Not on file  . Emotionally Abused: Not on file  . Physically Abused: Not on file  . Sexually Abused: Not on file     FAMILY HISTORY Family History  Problem Relation Age of Onset  . Hypertension Mother   . Heart disease Mother        CHF  . Stroke Mother   . Hyperlipidemia Father   . Stroke Father   . Hypertension Father   . Stroke Maternal Grandmother   . Colon cancer Unknown   . Breast cancer Unknown       Review of Systems: 12 systems reviewed Otherwise as per HPI, all other systems reviewed  and negative  Physical Exam: Vitals:   12/31/19 1142 12/31/19 1200  BP: (!) 182/78 (!) 178/85  Pulse:  65  Resp:  (!) 22  Temp:    SpO2:  99%   Total I/O In: 1200 [I.V.:1000; IV Piggyback:200] Out: -   Intake/Output Summary (Last 24 hours) at 12/31/2019 1451 Last data filed at 12/31/2019 1144 Gross per 24 hour  Intake 2200 ml  Output --  Net 2200 ml   General: well-appearing, no acute distress HEENT: anicteric sclera, oropharynx clear without lesions CV: normal rate, no murmur Lungs: bilateral chest rise, normal work of breathing Abd: soft, non-tender, non-distended Skin: no visible lesions or rashes Psych: alert, engaged, appropriate mood and affect Musculoskeletal: no obvious deformities Neuro: normal speech, no gross focal deficits   Test Results Reviewed Lab Results  Component Value Date   NA 140 12/31/2019   K 4.3 12/31/2019   CL 113 (H) 12/31/2019   CO2 14 (L) 12/31/2019   BUN 77 (H) 12/31/2019   CREATININE 4.17 (H) 12/31/2019   CALCIUM 8.7 (L) 12/31/2019   ALBUMIN 3.1 (L) 12/31/2019   PHOS 5.4 (H) 12/31/2019     I have reviewed all relevant outside healthcare records related to the patient's kidney injury.

## 2019-12-31 NOTE — ED Notes (Signed)
Urine collection for 24 hour urine began at 0030.  Pt has purewick in place, advised her to notify me when she voids so urine can be added to cooled collection container.  Pt was sleeping at 2a when I went in to check on her and hang new bag of fluid

## 2019-12-31 NOTE — Progress Notes (Signed)
PROGRESS NOTE    Brittany Charles  VFI:[id number] DOB: [birth year] DOA: 12/30/2019 PCP: Wendie Agreste, MD    Brief Narrative: Brittany Charles is a 69 y.o. female with history of CKD-4, CHF unknown type, CAD, HTN, lupus, gout and arthritis presenting with generalized weakness, productive cough and diarrhea. Patient reports cough for the last 2 weeks.  She developed watery diarrhea earlier this morning.  Also felt very weak and lightheaded that prompted her to come to ED.  She denies fever, chills, hemoptysis, denies chest pain, dyspnea, orthopnea, PND or edema. she denies nausea or vomiting but reports dysphagia with solids.   She had nasal congestion for few days.  She says she had multiple family members who tested positive for COVID-19 recently.  She is fully vaccinated against COVID-19.  She had both shots of Pfizer in February 2021.  She denies headache, vision change, myalgia, loss of taste or smell, UTI symptoms or focal neuro symptoms.  She has not noticed changes to urine output.  She has not taken her Lasix in the last 2 to 3 days.  She denies NSAID use.  Patient is followed by Dr. Johnney Ou from [facility].  In ED, COVID-19 PCR positive.  SBP in the range of 140s to 150s.  Otherwise hemodynamically stable.  Saturating in upper 90s on room air. Cr 5.77 (2.89 and 11/2018).  BUN 98.  K3.7.  Bicarb 18.  Anion gap 18.  Hgb 11.4.  Lipase was normal.  Otherwise, CMP and CBC without significant finding.  Chest x-ray concerning for slight bilateral infiltrate.  Patient was started on Decadron and IV fluid.  Urinalysis and inflammatory markers ordered.  Hospitalist service called for admission.    Assessment & Plan:   Active Problems:   AKI (acute kidney injury) ([place])  Acute kidney injury on CKD-4 with azotemia -suspect prerenal etiology in the setting of poor p.o. intake, diarrhea and diuretics.  Could have some degree of ATN from COVID-19 infection.  Patient is followed by Dr.  Johnney Ou at [facility]. Cr 5.77 (2.89 in 11/2018).  BUN 98.  GI symptoms could be due to Covid or uremia. -Continue IV NS at 125 cc an hour for the next 24 hours. -Renal ultrasound, urine chemistry, bladder scan -Monitor urine output. -Hold home Lasix -Nephrology to see patient in the morning. Discussed with Dr. Ala Bent gap metabolic acidosis: Bicarb 18.  Anion gap 18.  Likely due to azotemia/renal failure. - Started NaHCO3 Infusion. -Continue monitoring  COVID-19 pneumonia- symptomatic for about 2 weeks.  Maintaining good saturation on room air.  CXR with bilateral infiltrates.  Inflammatory markers pending.  She completed 2 series pfizer vaccine in February 2021. -Continue Decadron.  continue  remdesivir -Monitor inflammatory markers -Incentive spirometry, OOB/PT/OT -Mucolytic's and antitussives  Chronic CHF-unknown type.  No CHF symptoms.  She is on Lasix at home. -Monitor fluid status while on IV fluid. -Hold home Lasix  Diarrhea: Could be due to COVID-19 or uremia.  Abdominal exam is benign. -IV fluid as above  Dysphagia with solids -May need barium swallow eval if it does not improve with treatment of renal failure and Covid.  Generalized weakness/lightheadedness-likely due to dehydration, renal failure and COVID-19. -Treat treatable causes -PT/OT eval  History of PSVT: She is now sinus rhythm at 70.  On Cardizem CD 360 mg at home. -Will use Cardizem 30 mg every 6 hours  Essential hypertension: BP within fair range. -Hold home antihypertensive medications -P.o. Cardizem as above  Anemia of  renal disease: -Monitor H&H.  History of diabetes?-Does not take medications.  May be diet controlled. -Check hemoglobin A1c  History of lupus?  Does not seem to be on medication.   DVT prophylaxis: Subcu heparin       Code Status: Full code Family Communication: Updated patient's sister over the phone.         Disposition Plan: Admit to MedSurg Consults called:  Nephrology Admission status: Inpatient   Consultants:   Nephrology  Procedures:  Antimicrobials:  Anti-infectives (From admission, onward)   Start     Dose/Rate Route Frequency Ordered Stop   12/31/19 1000  remdesivir 100 mg in sodium chloride 0.9 % 100 mL IVPB       "Followed by" Linked Group Details   100 mg 200 mL/hr over 30 Minutes Intravenous Daily 12/30/19 1608 01/04/20 0959   12/31/19 0900  cefTRIAXone (ROCEPHIN) 1 g in sodium chloride 0.9 % 100 mL IVPB        1 g 200 mL/hr over 30 Minutes Intravenous Every 24 hours 12/31/19 0848     12/30/19 1800  remdesivir 200 mg in sodium chloride 0.9% 250 mL IVPB       "Followed by" Linked Group Details   200 mg 580 mL/hr over 30 Minutes Intravenous Once 12/30/19 1608 12/30/19 1801     Subjective: Patient was seen and examined at bedside.  No overnight events.  Patient denies any difficulty breathing,  chest pain.  She reports feeling weak but denies any other complaints.  Objective: Vitals:   12/31/19 1015 12/31/19 1100 12/31/19 1142 12/31/19 1200  BP:  (!) 182/78 (!) 182/78 (!) 178/85  Pulse: 62 64  65  Resp:    (!) 22  Temp:      TempSrc:      SpO2: 96% 99%  99%  Weight:      Height:        Intake/Output Summary (Last 24 hours) at 12/31/2019 1438 Last data filed at 12/31/2019 1144 Gross per 24 hour  Intake 2200 ml  Output --  Net 2200 ml   Filed Weights   12/30/19 1326  Weight: 90 kg    Examination:  General exam: Appears calm and comfortable  Respiratory system: Clear to auscultation. Respiratory effort normal. Cardiovascular system: S1 & S2 heard, RRR. No JVD, murmurs, rubs, gallops or clicks. No pedal edema. Gastrointestinal system: Abdomen is nondistended, soft and nontender. No organomegaly or masses felt. Normal bowel sounds heard. Central nervous system: Alert and oriented. No focal neurological deficits. Extremities: Symmetric 5 x 5 power. Skin: No rashes, lesions or ulcers Psychiatry: Judgement and  insight appear normal. Mood & affect appropriate.     Data Reviewed: I have personally reviewed following labs and imaging studies  CBC: Recent Labs  Lab 12/30/19 1217 12/31/19 0523  WBC 8.2 3.6*  NEUTROABS 5.3 2.7  HGB 11.4* 12.0  HCT 35.6* 38.7  MCV 86.2 86.4  PLT 383 102   Basic Metabolic Panel: Recent Labs  Lab 12/30/19 1217 12/31/19 0523  NA 139 140  K 3.7 4.3  CL 103 113*  CO2 18* 14*  GLUCOSE 111* 125*  BUN 98* 77*  CREATININE 5.77* 4.17*  CALCIUM 8.7* 8.7*  MG  --  2.5*  PHOS  --  5.4*   GFR: Estimated Creatinine Clearance: 14.3 mL/min (A) (by C-G formula based on SCr of 4.17 mg/dL (H)). Liver Function Tests: Recent Labs  Lab 12/30/19 1217 12/31/19 0523  AST 19 18  ALT 14 13  ALKPHOS 90 96  BILITOT 0.7 0.4  PROT 8.2* 8.4*  ALBUMIN 3.3* 3.1*   Recent Labs  Lab 12/30/19 1217  LIPASE 34   No results for input(s): AMMONIA in the last 168 hours. Coagulation Profile: No results for input(s): INR, PROTIME in the last 168 hours. Cardiac Enzymes: No results for input(s): CKTOTAL, CKMB, CKMBINDEX, TROPONINI in the last 168 hours. BNP (last 3 results) No results for input(s): PROBNP in the last 8760 hours. HbA1C: Recent Labs    12/31/19 0523  HGBA1C 6.5*   CBG: No results for input(s): GLUCAP in the last 168 hours. Lipid Profile: No results for input(s): CHOL, HDL, LDLCALC, TRIG, CHOLHDL, LDLDIRECT in the last 72 hours. Thyroid Function Tests: No results for input(s): TSH, T4TOTAL, FREET4, T3FREE, THYROIDAB in the last 72 hours. Anemia Panel: Recent Labs    12/30/19 1850 12/31/19 0909  FERRITIN 251 243   Sepsis Labs: No results for input(s): PROCALCITON, LATICACIDVEN in the last 168 hours.  Recent Results (from the past 240 hour(s))  SARS Coronavirus 2 by RT PCR (hospital order, performed in [facility] hospital lab) Nasopharyngeal Nasopharyngeal Swab     Status: Abnormal   Collection Time: 12/30/19 12:35 PM   Specimen: Nasopharyngeal  Swab  Result Value Ref Range Status   SARS Coronavirus 2 POSITIVE (A) NEGATIVE Final    Comment: RESULT CALLED TO, READ BACK BY AND VERIFIED WITH: A.LEADWELL,RN [id number] @1437  BY V.WILKINS (NOTE) SARS-CoV-2 target nucleic acids are DETECTED  SARS-CoV-2 RNA is generally detectable in upper respiratory specimens  during the acute phase of infection.  Positive results are indicative  of the presence of the identified virus, but do not rule out bacterial infection or co-infection with other pathogens not detected by the test.  Clinical correlation with patient history and  other diagnostic information is necessary to determine patient infection status.  The expected result is negative.  Fact Sheet for Patients:   StrictlyIdeas.no   Fact Sheet for Healthcare Providers:   BankingDealers.co.za    This test is not yet approved or cleared by the [place] FDA and  has been authorized for detection and/or diagnosis of SARS-CoV-2 by FDA under an Emergency Use Authorization (EUA).  This EUA will remain in effect (meaning th is test can be used) for the duration of  the COVID-19 declaration under Section 564(b)(1) of the Act, 21 U.S.C. section 360-bbb-3(b)(1), unless the authorization is terminated or revoked sooner.  Performed at [facility], [place] [address], [place], [place] [address]          Radiology Studies: US RENAL  Result Date: 12/31/2019 CLINICAL DATA:  Acute kidney injury EXAM: RENAL / URINARY TRACT ULTRASOUND COMPLETE COMPARISON:  Abdominal CT 01/11/2017 FINDINGS: Right Kidney: Renal measurements: 9 x 4.6 x 5.4 cm = volume: 115 mL. Echogenicity is mildly increased. No mass or hydronephrosis visualized. Left Kidney: Renal measurements: 9 x 5 x 5 cm = volume: 125 mL. Echogenicity is mildly increased. No solid mass or hydronephrosis visualized. Upper pole simple cyst measuring 2 cm Bladder: Appears normal for  degree of bladder distention. IMPRESSION: 1. Medical renal disease without asymmetry/atrophy. 2. Incidental in simple left upper pole cyst. Electronically Signed   By: Monte Fantasia M.D.   On: 12/31/2019 07:33   DG Chest Portable 1 View  Result Date: 12/30/2019 CLINICAL DATA:  Cough, COVID-19 exposure EXAM: PORTABLE CHEST 1 VIEW COMPARISON:  04/17/2011 chest radiograph. FINDINGS: Stable cardiomediastinal silhouette with normal heart size. No pneumothorax. No pleural effusion. Faint  patchy hazy opacities in the peripheral left mid lung and lower right lung. No pulmonary edema. IMPRESSION: Faint patchy hazy opacities in the peripheral left mid lung and lower right lung, cannot exclude atypical/viral pneumonia, although the differential includes mild scarring or atelectasis. Electronically Signed   By: Ilona Sorrel M.D.   On: 12/30/2019 13:45        Scheduled Meds: . vitamin C  500 mg Oral Daily  . dexamethasone (DECADRON) injection  6 mg Intravenous Daily  . diltiazem  30 mg Oral Q6H  . heparin  5,000 Units Subcutaneous Q8H  . Ipratropium-Albuterol  1 puff Inhalation Q6H  . pantoprazole  40 mg Oral Daily  . zinc sulfate  220 mg Oral Daily   Continuous Infusions: . sodium chloride    . sodium chloride Stopped (12/31/19 1144)  . cefTRIAXone (ROCEPHIN)  IV Stopped (12/31/19 0945)  . remdesivir 100 mg in NS 100 mL Stopped (12/31/19 0941)     LOS: 1 day    Time spent: 35 mins    Onnie Hatchel, MD [facility]   If 7PM-7AM, please contact night-coverage

## 2020-01-01 LAB — PHOSPHORUS: Phosphorus: 3.5 mg/dL (ref 2.5–4.6)

## 2020-01-01 LAB — CBC WITH DIFFERENTIAL/PLATELET
Abs Immature Granulocytes: 0.05 10*3/uL (ref 0.00–0.07)
Basophils Absolute: 0 10*3/uL (ref 0.0–0.1)
Basophils Relative: 0 %
Eosinophils Absolute: 0 10*3/uL (ref 0.0–0.5)
Eosinophils Relative: 0 %
HCT: 40.2 % (ref 36.0–46.0)
Hemoglobin: 12.7 g/dL (ref 12.0–15.0)
Immature Granulocytes: 1 %
Lymphocytes Relative: 10 %
Lymphs Abs: 1 10*3/uL (ref 0.7–4.0)
MCH: 26.8 pg (ref 26.0–34.0)
MCHC: 31.6 g/dL (ref 30.0–36.0)
MCV: 85 fL (ref 80.0–100.0)
Monocytes Absolute: 0.6 10*3/uL (ref 0.1–1.0)
Monocytes Relative: 6 %
Neutro Abs: 8.1 10*3/uL — ABNORMAL HIGH (ref 1.7–7.7)
Neutrophils Relative %: 83 %
Platelets: 488 10*3/uL — ABNORMAL HIGH (ref 150–400)
RBC: 4.73 MIL/uL (ref 3.87–5.11)
RDW: 14.6 % (ref 11.5–15.5)
WBC: 9.6 10*3/uL (ref 4.0–10.5)
nRBC: 0 % (ref 0.0–0.2)

## 2020-01-01 LAB — COMPREHENSIVE METABOLIC PANEL
ALT: 13 U/L (ref 0–44)
AST: 18 U/L (ref 15–41)
Albumin: 3.2 g/dL — ABNORMAL LOW (ref 3.5–5.0)
Alkaline Phosphatase: 84 U/L (ref 38–126)
Anion gap: 17 — ABNORMAL HIGH (ref 5–15)
BUN: 56 mg/dL — ABNORMAL HIGH (ref 8–23)
CO2: 22 mmol/L (ref 22–32)
Calcium: 9.1 mg/dL (ref 8.9–10.3)
Chloride: 104 mmol/L (ref 98–111)
Creatinine, Ser: 3.09 mg/dL — ABNORMAL HIGH (ref 0.44–1.00)
GFR calc Af Amer: 17 mL/min — ABNORMAL LOW (ref >60)
GFR calc non Af Amer: 15 mL/min — ABNORMAL LOW (ref >60)
Glucose, Bld: 136 mg/dL — ABNORMAL HIGH (ref 70–99)
Potassium: 3.4 mmol/L — ABNORMAL LOW (ref 3.5–5.1)
Sodium: 143 mmol/L (ref 135–145)
Total Bilirubin: 0.5 mg/dL (ref 0.3–1.2)
Total Protein: 7.4 g/dL (ref 6.5–8.1)

## 2020-01-01 LAB — HIV ANTIBODY (ROUTINE TESTING W REFLEX): HIV Screen 4th Generation wRfx: NONREACTIVE

## 2020-01-01 LAB — D-DIMER, QUANTITATIVE: D-Dimer, Quant: 1.68 ug/mL-FEU — ABNORMAL HIGH (ref 0.00–0.50)

## 2020-01-01 LAB — MAGNESIUM: Magnesium: 1.8 mg/dL (ref 1.7–2.4)

## 2020-01-01 LAB — C-REACTIVE PROTEIN: CRP: 2.3 mg/dL — ABNORMAL HIGH (ref <1.0)

## 2020-01-01 LAB — FERRITIN: Ferritin: 241 ng/mL (ref 11–307)

## 2020-01-01 MED ORDER — CALCIUM ACETATE (PHOS BINDER) 667 MG PO CAPS
667.0000 mg | ORAL_CAPSULE | Freq: Three times a day (TID) | ORAL | Status: AC
  Administered 2020-01-02 – 2020-01-03 (×4): 667 mg via ORAL
  Filled 2020-01-01 (×6): qty 1

## 2020-01-01 MED ORDER — SODIUM CHLORIDE 0.9 % IV SOLN: INTRAVENOUS | Status: DC | PRN

## 2020-01-01 NOTE — ED Notes (Addendum)
Pt. REFUSED for all 0500 blood draws, pt. Stated that she wanted to speak to the Charge Nurse and or the Doctor . Pt. Was informed that all lab works were ordered by her admitting Doctor and necessary for her treatment. Pt. stated that she wanted was tired of being here in the hospital and wanted to go home instead. Charge Nurse is notified. Phlebotomist called .

## 2020-01-01 NOTE — Evaluation (Signed)
Physical Therapy Evaluation Patient Details Name: Brittany Charles MRN: [id number] DOB: [birth year] Today's Date: 01/01/2020   History of Present Illness  Brittany Charles is a 69 y.o. female with history of CKD-4, CHF, CAD, HTN, lupus, gout and arthritis presenting 12/30/19 with generalized weakness, productive cough and diarrhea. Tested positive for covid,  has been vaccinated.  Clinical Impression  The patient reports up to [facility] ad lib. Patient ambulated in room x 30' with min guard/supervision. SPo2 95% on RA  BP 142/74. Patient reports feeling better. Patient plans to return home with family. Pt admitted with above diagnosis. * Pt currently with functional limitations due to the deficits listed below (see PT Problem List). Pt will benefit from skilled PT to increase their independence and safety with mobility to allow discharge to the venue listed below.       Follow Up Recommendations No PT follow up    Equipment Recommendations  None recommended by PT    Recommendations for Other Services       Precautions / Restrictions Precautions Precautions: Fall      Mobility  Bed Mobility Overal bed mobility: Independent                Transfers Overall transfer level: Independent               General transfer comment: gets to bsc  Ambulation/Gait Ambulation/Gait assistance: Min guard Gait Distance (Feet): 30 Feet Assistive device: 1 person hand held assist Gait Pattern/deviations: Step-to pattern;Step-through pattern;Antalgic Gait velocity: decr   General Gait Details: slight limp to right. intermittent HHA,  Stairs            Wheelchair Mobility    Modified Rankin (Stroke Patients Only)       Balance Overall balance assessment: Mild deficits observed, not formally tested                                           Pertinent Vitals/Pain      Home Living Family/patient expects to be discharged to:: Private residence Living  Arrangements: Other relatives;Spouse/significant other Available Help at Discharge: Family Type of Home: House Home Access: Stairs to enter Entrance Stairs-Rails: Can reach both Entrance Stairs-Number of Steps: 2 Home Layout: One level Home Equipment: Cane - single point;Grab bars - tub/shower Additional Comments: states grandson stays with her days.    Prior Function Level of Independence: Independent;Independent with assistive device(s)         Comments: uses cane with gout flare, drives, was to pick up great grandchiildren from school     Hand Dominance   Dominant Hand: Right    Extremity/Trunk Assessment   Upper Extremity Assessment Upper Extremity Assessment: Overall WFL for tasks assessed    Lower Extremity Assessment Lower Extremity Assessment: RLE deficits/detail RLE Deficits / Details: somewhat antalgic on right    Cervical / Trunk Assessment Cervical / Trunk Assessment: Normal  Communication   Communication: No difficulties  Cognition Arousal/Alertness: Awake/alert Behavior During Therapy: WFL for tasks assessed/performed Overall Cognitive Status: Within Functional Limits for tasks assessed                                        General Comments      Exercises     Assessment/Plan    PT  Assessment Patient needs continued PT services  PT Problem List Decreased strength;Decreased activity tolerance;Decreased mobility       PT Treatment Interventions Gait training;Functional mobility training;Therapeutic activities;Therapeutic exercise;Patient/family education    PT Goals (Current goals can be found in the Care Plan section)  Acute Rehab PT Goals Patient Stated Goal: to go home PT Goal Formulation: With patient Time For Goal Achievement: 01/15/20 Potential to Achieve Goals: Good    Frequency Min 3X/week   Barriers to discharge        Co-evaluation               AM-PAC PT "6 Clicks" Mobility  Outcome Measure Help  needed turning from your back to your side while in a flat bed without using bedrails?: None Help needed moving from lying on your back to sitting on the side of a flat bed without using bedrails?: None Help needed moving to and from a bed to a chair (including a wheelchair)?: None Help needed standing up from a chair using your arms (e.g., wheelchair or bedside chair)?: None Help needed to walk in hospital room?: A Little Help needed climbing 3-5 steps with a railing? : A Little 6 Click Score: 22    End of Session   Activity Tolerance: Patient tolerated treatment well Patient left: in bed Nurse Communication: Mobility status PT Visit Diagnosis: Difficulty in walking, not elsewhere classified (R26.2)    Time: 8882-8003 PT Time Calculation (min) (ACUTE ONLY): 22 min   Charges:   PT Evaluation $PT Eval Low Complexity: [place] PT Acute Rehabilitation Services Pager [phone] Office [phone]   Claretha Cooper 01/01/2020, 3:53 PM

## 2020-01-01 NOTE — ED Notes (Signed)
Called son to talk to patient due to patient wanting to leave. Son states that him and his father will call and talk her and get her to stay.

## 2020-01-01 NOTE — Progress Notes (Signed)
Pt is not on tele but on continuous pulse ox. This nurse noted HR is irreg 103-130's. EKG to be done & place pt on telemetry. TRH M Denny informed.

## 2020-01-01 NOTE — ED Notes (Signed)
Pt was offered bed bath, but refused since she is tired of waiting in ED and wants an actual shower. Pt was told that it is not possible while being in the ED since she is covid +

## 2020-01-01 NOTE — Progress Notes (Signed)
PROGRESS NOTE    Brittany Charles  JSE:[id number] DOB: [birth year] DOA: 12/30/2019 PCP: Wendie Agreste, MD    Brief Narrative: Brittany Charles is a 69 y.o. female with history of CKD-4, CHF unknown type, CAD, HTN, lupus, gout and arthritis presenting with generalized weakness, productive cough and diarrhea. Patient reports cough for the last 2 weeks.  She developed watery diarrhea earlier this morning.  Also felt very weak and lightheaded that prompted her to come to ED.  She denies fever, chills, hemoptysis, denies chest pain, dyspnea, orthopnea, PND or edema. she denies nausea or vomiting but reports dysphagia with solids.   She had nasal congestion for few days.  She says she had multiple family members who tested positive for COVID-19 recently.  She is fully vaccinated against COVID-19.  She had both shots of Pfizer in February 2021.  She denies headache, vision change, myalgia, loss of taste or smell, UTI symptoms or focal neuro symptoms.  She has not noticed changes to urine output.  She has not taken her Lasix in the last 2 to 3 days.  She denies NSAID use.  Patient is followed by Dr. Johnney Ou from [facility].  In ED, COVID-19 PCR positive.  SBP in the range of 140s to 150s.  Otherwise hemodynamically stable.  Saturating in upper 90s on room air. Cr 5.77 (2.89 and 11/2018).  BUN 98.  K3.7.  Bicarb 18.  Anion gap 18.  Hgb 11.4.  Lipase was normal.  Otherwise, CMP and CBC without significant finding.  Chest x-ray concerning for slight bilateral infiltrate.  Patient was started on Decadron and IV fluid.  Urinalysis and inflammatory markers ordered.  Hospitalist service called for admission.    Assessment & Plan:   Active Problems:   AKI (acute kidney injury) ([place])  Acute kidney injury on CKD-4 with azotemia -suspect prerenal etiology in the setting of poor p.o. intake, diarrhea and diuretics.  Could have some degree of ATN from COVID-19 infection.  Patient is followed by Dr.  Johnney Ou at [facility] Cr 5.77 (2.89 in 11/2018).  BUN 98.  GI symptoms could be due to Covid or uremia. -Continue IV NS at 125 cc an hour for the next 24 hours. -Renal ultrasound: medical renal disease.  -Monitor urine output. -Hold home Lasix -Nephrology consulted, Dr. Joylene Grapes recommended HCO3 infusion, renal functions improving.   Anion gap metabolic acidosis: Bicarb 18.  Anion gap 18.  Likely due to azotemia/renal failure. - continue  NaHCO3 Infusion. -Continue monitoring  COVID-19 pneumonia- symptomatic for about 2 weeks.  Maintaining good saturation on room air.   CXR with bilateral infiltrates.  Inflammatory markers pending.  She completed 2 series pfizer vaccine in February 2021. -Continue Decadron.  continue  remdesivir -Monitor inflammatory markers -Incentive spirometry, OOB/PT/OT -Mucolytic's and antitussives  Chronic CHF-unknown type.  No CHF symptoms.  She is on Lasix at home. -Monitor fluid status while on IV fluid. -Hold home Lasix  Diarrhea: Could be due to COVID-19 or uremia.  Abdominal exam is benign. -IV fluid as above.  Dysphagia with solids -May need barium swallow eval if it does not improve with treatment of renal failure and Covid.  Generalized weakness/lightheadedness-likely due to dehydration, renal failure and COVID-19. -Treat treatable causes -PT/OT eval  History of PSVT: She is now sinus rhythm at 70.  On Cardizem CD 360 mg at home. -Will use Cardizem 30 mg every 6 hours  Essential hypertension: BP within fair range. -Hold home antihypertensive medications -P.o. Cardizem as above  Anemia of renal disease:  Hb stable. -Monitor H&H.  History of diabetes?-Does not take medications.  May be diet controlled. -Check hemoglobin A1c  History of lupus?  Does not seem to be on medication.   DVT prophylaxis: Subcu heparin       Code Status: Full code Family Communication: Updated patient's sister over the phone.         Disposition Plan:  Home  with Home PT/ SNF Anticipated DC home in 1-2 days  Consults called: Nephrology    Consultants:   Nephrology  Procedures:  Antimicrobials:  Anti-infectives (From admission, onward)   Start     Dose/Rate Route Frequency Ordered Stop   12/31/19 1000  remdesivir 100 mg in sodium chloride 0.9 % 100 mL IVPB       "Followed by" Linked Group Details   100 mg 200 mL/hr over 30 Minutes Intravenous Daily 12/30/19 1608 01/04/20 0959   12/31/19 0900  cefTRIAXone (ROCEPHIN) 1 g in sodium chloride 0.9 % 100 mL IVPB        1 g 200 mL/hr over 30 Minutes Intravenous Every 24 hours 12/31/19 0848     12/30/19 1800  remdesivir 200 mg in sodium chloride 0.9% 250 mL IVPB       "Followed by" Linked Group Details   200 mg 580 mL/hr over 30 Minutes Intravenous Once 12/30/19 1608 12/30/19 1801     Subjective: Patient was seen and examined at bedside.  No overnight events.  Patient appears very frustrated with prolonged stay in ED.  She states she does not like the care,  she gets it here, denies any chest pain or shortness of breath.  Objective: Vitals:   01/01/20 1100 01/01/20 1200 01/01/20 1300 01/01/20 1400  BP: (!) 148/89 (!) 160/68 (!) 157/60 (!) 148/61  Pulse: 61 (!) 57 (!) 57 60  Resp: (!) 23 19 (!) 22 (!) 21  Temp:      TempSrc:      SpO2: 97% 95% 97% 98%  Weight:      Height:        Intake/Output Summary (Last 24 hours) at 01/01/2020 1520 Last data filed at 01/01/2020 1223 Gross per 24 hour  Intake 200 ml  Output 1000 ml  Net -800 ml   Filed Weights   12/30/19 1326  Weight: 90 kg    Examination:  General exam: Appears calm and comfortable  Respiratory system: Clear to auscultation. Respiratory effort normal. Cardiovascular system: S1 & S2 heard, RRR. No JVD, murmurs, rubs, gallops or clicks. No pedal edema. Gastrointestinal system: Abdomen is nondistended, soft and nontender. No organomegaly or masses felt. Normal bowel sounds heard. Central nervous system: Alert and  oriented. No focal neurological deficits. Extremities: Symmetric 5 x 5 power. Skin: No rashes, lesions or ulcers Psychiatry: Judgement and insight appear normal. Mood & affect appropriate.     Data Reviewed: I have personally reviewed following labs and imaging studies  CBC: Recent Labs  Lab 12/30/19 1217 12/31/19 0523  WBC 8.2 3.6*  NEUTROABS 5.3 2.7  HGB 11.4* 12.0  HCT 35.6* 38.7  MCV 86.2 86.4  PLT 383 295   Basic Metabolic Panel: Recent Labs  Lab 12/30/19 1217 12/31/19 0523  NA 139 140  K 3.7 4.3  CL 103 113*  CO2 18* 14*  GLUCOSE 111* 125*  BUN 98* 77*  CREATININE 5.77* 4.17*  CALCIUM 8.7* 8.7*  MG  --  2.5*  PHOS  --  5.4*   GFR: Estimated Creatinine Clearance: 14.3  mL/min (A) (by C-G formula based on SCr of 4.17 mg/dL (H)). Liver Function Tests: Recent Labs  Lab 12/30/19 1217 12/31/19 0523  AST 19 18  ALT 14 13  ALKPHOS 90 96  BILITOT 0.7 0.4  PROT 8.2* 8.4*  ALBUMIN 3.3* 3.1*   Recent Labs  Lab 12/30/19 1217  LIPASE 34   No results for input(s): AMMONIA in the last 168 hours. Coagulation Profile: No results for input(s): INR, PROTIME in the last 168 hours. Cardiac Enzymes: No results for input(s): CKTOTAL, CKMB, CKMBINDEX, TROPONINI in the last 168 hours. BNP (last 3 results) No results for input(s): PROBNP in the last 8760 hours. HbA1C: Recent Labs    12/31/19 0523  HGBA1C 6.5*   CBG: No results for input(s): GLUCAP in the last 168 hours. Lipid Profile: No results for input(s): CHOL, HDL, LDLCALC, TRIG, CHOLHDL, LDLDIRECT in the last 72 hours. Thyroid Function Tests: No results for input(s): TSH, T4TOTAL, FREET4, T3FREE, THYROIDAB in the last 72 hours. Anemia Panel: Recent Labs    12/30/19 1850 12/31/19 0909  FERRITIN 251 243   Sepsis Labs: No results for input(s): PROCALCITON, LATICACIDVEN in the last 168 hours.  Recent Results (from the past 240 hour(s))  SARS Coronavirus 2 by RT PCR (hospital order, performed in [facility] hospital lab) Nasopharyngeal Nasopharyngeal Swab     Status: Abnormal   Collection Time: 12/30/19 12:35 PM   Specimen: Nasopharyngeal Swab  Result Value Ref Range Status   SARS Coronavirus 2 POSITIVE (A) NEGATIVE Final    Comment: RESULT CALLED TO, READ BACK BY AND VERIFIED WITH: A.LEADWELL,RN [id number] @1437  BY V.WILKINS (NOTE) SARS-CoV-2 target nucleic acids are DETECTED  SARS-CoV-2 RNA is generally detectable in upper respiratory specimens  during the acute phase of infection.  Positive results are indicative  of the presence of the identified virus, but do not rule out bacterial infection or co-infection with other pathogens not detected by the test.  Clinical correlation with patient history and  other diagnostic information is necessary to determine patient infection status.  The expected result is negative.  Fact Sheet for Patients:   StrictlyIdeas.no   Fact Sheet for Healthcare Providers:   BankingDealers.co.za    This test is not yet approved or cleared by the [place] FDA and  has been authorized for detection and/or diagnosis of SARS-CoV-2 by FDA under an Emergency Use Authorization (EUA).  This EUA will remain in effect (meaning th is test can be used) for the duration of  the COVID-19 declaration under Section 564(b)(1) of the Act, 21 U.S.C. section 360-bbb-3(b)(1), unless the authorization is terminated or revoked sooner.  Performed at [facility], [place] [address], [place], [place] [address]      Radiology Studies: US RENAL  Result Date: 12/31/2019 CLINICAL DATA:  Acute kidney injury EXAM: RENAL / URINARY TRACT ULTRASOUND COMPLETE COMPARISON:  Abdominal CT 01/11/2017 FINDINGS: Right Kidney: Renal measurements: 9 x 4.6 x 5.4 cm = volume: 115 mL. Echogenicity is mildly increased. No mass or hydronephrosis visualized. Left Kidney: Renal measurements: 9 x 5 x 5 cm = volume: 125 mL. Echogenicity  is mildly increased. No solid mass or hydronephrosis visualized. Upper pole simple cyst measuring 2 cm Bladder: Appears normal for degree of bladder distention. IMPRESSION: 1. Medical renal disease without asymmetry/atrophy. 2. Incidental in simple left upper pole cyst. Electronically Signed   By: Monte Fantasia M.D.   On: 12/31/2019 07:33    Scheduled Meds: . vitamin C  500 mg Oral Daily  .  calcium acetate  667 mg Oral TID WC  . dexamethasone (DECADRON) injection  6 mg Intravenous Daily  . diltiazem  30 mg Oral Q6H  . heparin  5,000 Units Subcutaneous Q8H  . hydrALAZINE  50 mg Oral Q8H  . Ipratropium-Albuterol  1 puff Inhalation Q6H  . pantoprazole  40 mg Oral Daily  . zinc sulfate  220 mg Oral Daily   Continuous Infusions: . sodium chloride    . cefTRIAXone (ROCEPHIN)  IV Stopped (01/01/20 1222)  . remdesivir 100 mg in NS 100 mL Stopped (01/01/20 1223)  .  sodium bicarbonate  infusion 1000 mL 125 mL/hr at 01/01/20 0110     LOS: 2 days    Time spent: 25 mins    Nolan Lasser, MD [facility]   If 7PM-7AM, please contact night-coverage

## 2020-01-01 NOTE — Progress Notes (Signed)
[facility] Progress Note  Subjective: seen in ED, feeling cold but otherwise no c/os  Vitals:   01/01/20 1300 01/01/20 1400 01/01/20 1500 01/01/20 1530  BP: (!) 157/60 (!) 148/61 (!) 182/64 (!) 146/80  Pulse: (!) 57 60 66 74  Resp: (!) 22 (!) 21 18 14   Temp:      TempSrc:      SpO2: 97% 98% 97% 97%  Weight:      Height:        Exam: General: well-appearing, no acute distress HEENT: anicteric sclera, oropharynx clear without lesions CV: normal rate, no murmur Lungs: bilateral chest rise, normal work of breathing Abd: soft, non-tender, non-distended Skin: no visible lesions or rashes Psych: alert, engaged, appropriate mood and affect Musculoskeletal: no obvious deformities Ext: no UE or LE edeam Neuro: normal speech, no gross focal deficits   8/21 CXR - IMPRESSION: Faint patchy hazy opacities in the peripheral left mid lung and lower right lung, cannot exclude atypical/viral pneumonia, although the differential includes mild scarring or atelectasis.  Assessment/ Plan: 1. AoCKD 4 - b/l creat 3- 3.5 from our office.  Peak creat here was 5.7 on admit 8/21. Improving today down to 4.17 and good UOP. Prob dehydration and/or COVID AKI/ ATN.  Urinalysis with protein similar to the past.  Small hemoglobin and only 6-10 RBCs.  Urine sodium low c/w dehydration.  Will continue IVF's at 125 cc/hr, still has room for volume.  2. COVID PNA - symptomatic for about 2 weeks.  SpO2 is good on RA, CXR showed bilat infiltrates.  Is vaccinated. Getting IV decadron and remdesivir per pmd.  3. Gen weakness - due to dehydration, COVID infection, AKI 4. H/o PSVT - getting cardizem short acting here 5. HTN - good BP's, cont meds     Kelly Splinter 01/01/2020, 4:23 PM   Recent Labs  Lab 12/30/19 1217 12/31/19 0523  K 3.7 4.3  BUN 98* 77*  CREATININE 5.77* 4.17*  CALCIUM 8.7* 8.7*  PHOS  --  5.4*  HGB 11.4* 12.0   Inpatient medications: . vitamin C  500 mg Oral Daily  .  calcium acetate  667 mg Oral TID WC  . dexamethasone (DECADRON) injection  6 mg Intravenous Daily  . diltiazem  30 mg Oral Q6H  . heparin  5,000 Units Subcutaneous Q8H  . hydrALAZINE  50 mg Oral Q8H  . Ipratropium-Albuterol  1 puff Inhalation Q6H  . pantoprazole  40 mg Oral Daily  . zinc sulfate  220 mg Oral Daily   . sodium chloride    . cefTRIAXone (ROCEPHIN)  IV Stopped (01/01/20 1222)  . remdesivir 100 mg in NS 100 mL Stopped (01/01/20 1223)  .  sodium bicarbonate  infusion 1000 mL 125 mL/hr at 01/01/20 0110   acetaminophen, chlorpheniramine-HYDROcodone, guaiFENesin-dextromethorphan, ondansetron **OR** ondansetron (ZOFRAN) IV, oxyCODONE, zolpidem

## 2020-01-02 ENCOUNTER — Other Ambulatory Visit (HOSPITAL_COMMUNITY): Payer: Medicare HMO

## 2020-01-02 ENCOUNTER — Encounter (HOSPITAL_COMMUNITY): Payer: Self-pay | Admitting: Student

## 2020-01-02 DIAGNOSIS — I4891 Unspecified atrial fibrillation: Secondary | ICD-10-CM

## 2020-01-02 LAB — COMPREHENSIVE METABOLIC PANEL
ALT: 11 U/L (ref 0–44)
AST: 15 U/L (ref 15–41)
Albumin: 2.7 g/dL — ABNORMAL LOW (ref 3.5–5.0)
Alkaline Phosphatase: 75 U/L (ref 38–126)
Anion gap: 12 (ref 5–15)
BUN: 56 mg/dL — ABNORMAL HIGH (ref 8–23)
CO2: 25 mmol/L (ref 22–32)
Calcium: 8.6 mg/dL — ABNORMAL LOW (ref 8.9–10.3)
Chloride: 107 mmol/L (ref 98–111)
Creatinine, Ser: 3.05 mg/dL — ABNORMAL HIGH (ref 0.44–1.00)
GFR calc Af Amer: 17 mL/min — ABNORMAL LOW (ref >60)
GFR calc non Af Amer: 15 mL/min — ABNORMAL LOW (ref >60)
Glucose, Bld: 115 mg/dL — ABNORMAL HIGH (ref 70–99)
Potassium: 3.1 mmol/L — ABNORMAL LOW (ref 3.5–5.1)
Sodium: 144 mmol/L (ref 135–145)
Total Bilirubin: 0.6 mg/dL (ref 0.3–1.2)
Total Protein: 6.7 g/dL (ref 6.5–8.1)

## 2020-01-02 LAB — CBC WITH DIFFERENTIAL/PLATELET
Abs Immature Granulocytes: 0.05 10*3/uL (ref 0.00–0.07)
Basophils Absolute: 0 10*3/uL (ref 0.0–0.1)
Basophils Relative: 0 %
Eosinophils Absolute: 0 10*3/uL (ref 0.0–0.5)
Eosinophils Relative: 0 %
HCT: 35.4 % — ABNORMAL LOW (ref 36.0–46.0)
Hemoglobin: 11.2 g/dL — ABNORMAL LOW (ref 12.0–15.0)
Immature Granulocytes: 1 %
Lymphocytes Relative: 11 %
Lymphs Abs: 1 10*3/uL (ref 0.7–4.0)
MCH: 27 pg (ref 26.0–34.0)
MCHC: 31.6 g/dL (ref 30.0–36.0)
MCV: 85.3 fL (ref 80.0–100.0)
Monocytes Absolute: 0.7 10*3/uL (ref 0.1–1.0)
Monocytes Relative: 7 %
Neutro Abs: 7.1 10*3/uL (ref 1.7–7.7)
Neutrophils Relative %: 81 %
Platelets: 450 10*3/uL — ABNORMAL HIGH (ref 150–400)
RBC: 4.15 MIL/uL (ref 3.87–5.11)
RDW: 14.6 % (ref 11.5–15.5)
WBC: 8.8 10*3/uL (ref 4.0–10.5)
nRBC: 0 % (ref 0.0–0.2)

## 2020-01-02 LAB — D-DIMER, QUANTITATIVE: D-Dimer, Quant: 1.78 ug/mL-FEU — ABNORMAL HIGH (ref 0.00–0.50)

## 2020-01-02 LAB — PHOSPHORUS: Phosphorus: 4.5 mg/dL (ref 2.5–4.6)

## 2020-01-02 LAB — FERRITIN: Ferritin: 220 ng/mL (ref 11–307)

## 2020-01-02 LAB — PROTIME-INR
INR: 1.1 (ref 0.8–1.2)
Prothrombin Time: 14.1 seconds (ref 11.4–15.2)

## 2020-01-02 LAB — APTT: aPTT: 36 seconds (ref 24–36)

## 2020-01-02 LAB — MAGNESIUM: Magnesium: 1.8 mg/dL (ref 1.7–2.4)

## 2020-01-02 LAB — C-REACTIVE PROTEIN: CRP: 1.5 mg/dL — ABNORMAL HIGH (ref <1.0)

## 2020-01-02 MED ORDER — HEPARIN BOLUS VIA INFUSION
4000.0000 [IU] | Freq: Once | INTRAVENOUS | Status: AC
  Administered 2020-01-02: 4000 [IU] via INTRAVENOUS
  Filled 2020-01-02: qty 4000

## 2020-01-02 MED ORDER — HEPARIN (PORCINE) 25000 UT/250ML-% IV SOLN
1100.0000 [IU]/h | INTRAVENOUS | Status: DC
  Administered 2020-01-02: 1100 [IU]/h via INTRAVENOUS
  Filled 2020-01-02: qty 250

## 2020-01-02 MED ORDER — POTASSIUM CHLORIDE 20 MEQ PO PACK: 40.0000 meq | PACK | Freq: Once | ORAL | Status: DC

## 2020-01-02 MED ORDER — POTASSIUM CHLORIDE CRYS ER 20 MEQ PO TBCR
40.0000 meq | EXTENDED_RELEASE_TABLET | Freq: Once | ORAL | Status: AC
  Administered 2020-01-02: 40 meq via ORAL
  Filled 2020-01-02: qty 2

## 2020-01-02 MED ORDER — METOPROLOL TARTRATE 5 MG/5ML IV SOLN
5.0000 mg | INTRAVENOUS | Status: DC | PRN
  Administered 2020-01-02: 5 mg via INTRAVENOUS
  Filled 2020-01-02: qty 5

## 2020-01-02 MED ORDER — METOPROLOL TARTRATE 5 MG/5ML IV SOLN
5.0000 mg | INTRAVENOUS | Status: AC | PRN
  Administered 2020-01-02: 5 mg via INTRAVENOUS
  Filled 2020-01-02: qty 5

## 2020-01-02 MED ORDER — METOPROLOL TARTRATE 5 MG/5ML IV SOLN
5.0000 mg | INTRAVENOUS | Status: AC | PRN
  Administered 2020-01-02 (×2): 5 mg via INTRAVENOUS
  Filled 2020-01-02 (×2): qty 5

## 2020-01-02 MED ORDER — POTASSIUM CHLORIDE 20 MEQ PO PACK
40.0000 meq | PACK | Freq: Once | ORAL | Status: AC
  Administered 2020-01-02: 40 meq via ORAL
  Filled 2020-01-02: qty 2

## 2020-01-02 NOTE — Progress Notes (Signed)
   01/02/20 0404  Assess: MEWS Score  Temp 98.1 F (36.7 C)  BP 113/68  Pulse Rate (!) 113  Resp 18  SpO2 99 %  O2 Device Nasal Cannula  Assess: MEWS Score  MEWS Temp 0  MEWS Systolic 0  MEWS Pulse 2  MEWS RR 0  MEWS LOC 0  MEWS Score 2  MEWS Score Color Yellow  Assess: if the MEWS score is Yellow or Red  Were vital signs taken at a resting state? Yes  Focused Assessment No change from prior assessment  Early Detection of Sepsis Score *See Row Information* Medium  MEWS guidelines implemented *See Row Information* No, previously yellow, continue vital signs every 4 hours  Treat  MEWS Interventions Other (Comment) (monitoring HR  after 0300 metoprolol)  Pain Scale 0-10  Pain Score 0  Document  Patient Outcome Stabilized after interventions

## 2020-01-02 NOTE — Progress Notes (Signed)
3rd dose metoprolol 5mg  given. HR has been between 100-140s not sustaining below 120 since before 2200 last PM. Pt in No distress

## 2020-01-02 NOTE — Care Management Important Message (Signed)
Important Message  Patient Details IM Letter given to the Patient Name: Brittany Charles MRN: [id number] Date of Birth: 1950/09/26   Medicare Important Message Given:  Yes     Kerin Salen 01/02/2020, 9:51 AM

## 2020-01-02 NOTE — Consult Note (Addendum)
Cardiology Consultation:   Patient ID: Brittany Charles MRN: [id number]; DOB: March 26, 1951  Admit date: 12/30/2019 Date of Consult: 01/02/2020  Primary Care Provider: Wendie Agreste, MD [facility] Cardiologist: Candee Furbish, MD  [facility] Electrophysiologist:  None    Patient Profile:   Brittany Charles is a 69 y.o. female with a hx of CAD, nonobstructive, SVT- 2015, CKD with Cr at 4, HTN Lupus hx of MI who is being seen today for the evaluation of atrial fib with tachy brady in combination with AKI and COVID+ at the request of Dr. Dwyane Dee.  History of Present Illness:   [name] with above hx cardiac cath 04/2011 with mild pulmonary HTN, mild to moderate LAD stenosis (30-40%, mLAD 50%), non obstructive CAD.  EF 55-65%.   Last echo 2014 with EF 45-50% LA and RA mildly dilated.    One episode of SVT with conversion with Dilt in 2015.       Now admitted 12/30/19 with diarrhea and cough, and + family for covid. Had been sick for about 2 weeks.  She had been vaccinated against  COVID-19 in Feb. With [employer].   She had not taken her lasix in 2-3 days. She is COVID +. sp02 was in 90s on RA.  Cr 5.77 up from 2.89 to 3 her chronic.  + COVID 19 PNA -no HF on admit.  EKG I do not find in Epic on admit but reported SR.   Nephrology consulted.  Last evening around 2350 pt with elevated HR and tele ordered.  Given metoprolol 5 mg X 3  Now on dilt 30 mg every 6 hours. Not on IV heparin.   CHA2DS2VASc of 4  EKG:  The EKG was personally reviewed and demonstrates:  A fib RVR with ST depression I, V6 and HR 151.   Telemetry:  Telemetry was personally reviewed and demonstrates:  A fib --HR to 55-65 a fib  Na 144, K+ 3.1, BUN 56, Cr 3.05 Mg+ 1.8, alb. 2.7, AST 15, ALT 11 BNP 31 Hgb 11.2 plts 450, WBC  DDimer 1.78    Past Medical History:  Diagnosis Date  . Arthritis   . CHF (congestive heart failure) ([place])   . Fibroids   . Hypertension   . Lupus (systemic lupus erythematosus) ([place])    . Myocardial infarction ([place])   . Renal insufficiency     Past Surgical History:  Procedure Laterality Date  . ABDOMINAL HYSTERECTOMY  10/27/2000   TAH, BSO  . LEFT AND RIGHT HEART CATHETERIZATION WITH CORONARY ANGIOGRAM Right 04/21/2011   Procedure: LEFT AND RIGHT HEART CATHETERIZATION WITH CORONARY ANGIOGRAM;  Surgeon: [name], MD;  Location: [facility] CATH LAB;  Service: Cardiovascular;  Laterality: Right;  . TONSILLECTOMY       Home Medications:  Prior to Admission medications   Medication Sig Start Date End Date Taking? Authorizing Provider  acetaminophen (TYLENOL) 325 MG tablet Take 650 mg by mouth as needed for mild pain or headache.    Yes [provider]  allopurinol (ZYLOPRIM) 100 MG tablet TAKE 1 TABLET EVERY DAY Patient taking differently: Take 100 mg by mouth daily.  10/10/15  Yes Posey Boyer, MD  calcitRIOL (ROCALTROL) 0.25 MCG capsule Take 0.125 mcg by mouth daily. 12/15/19  Yes [provider]  COLCRYS 0.6 MG tablet Take 0.6 mg by mouth as needed (gout flareups).  07/28/18  Yes [provider]  diltiazem (CARDIZEM CD) 300 MG 24 hr capsule Take 300 mg by mouth daily. 08/20/18  Yes [provider]  furosemide (LASIX) 80 MG tablet Take 80 mg by mouth daily. 06/30/18  Yes [provider]  hydrALAZINE (APRESOLINE) 50 MG tablet TAKE 1 TABLET THREE TIMES DAILY Patient taking differently: Take 50 mg by mouth 3 (three) times daily.  10/10/15  Yes Posey Boyer, MD    Inpatient Medications: Scheduled Meds: . vitamin C  500 mg Oral Daily  . calcium acetate  667 mg Oral TID WC  . dexamethasone (DECADRON) injection  6 mg Intravenous Daily  . diltiazem  30 mg Oral Q6H  . heparin  5,000 Units Subcutaneous Q8H  . hydrALAZINE  50 mg Oral Q8H  . Ipratropium-Albuterol  1 puff Inhalation Q6H  . pantoprazole  40 mg Oral Daily  . potassium chloride  40 mEq Oral Once  . potassium chloride  40 mEq Oral Once  . zinc sulfate  220 mg Oral Daily    Continuous Infusions: . sodium chloride    . cefTRIAXone (ROCEPHIN)  IV 1 g (01/02/20 0851)  . remdesivir 100 mg in NS 100 mL Stopped (01/01/20 1223)  .  sodium bicarbonate  infusion 1000 mL 125 mL/hr at 01/01/20 0110   PRN Meds: sodium chloride, acetaminophen, chlorpheniramine-HYDROcodone, guaiFENesin-dextromethorphan, metoprolol tartrate, ondansetron **OR** ondansetron (ZOFRAN) IV, oxyCODONE, zolpidem  Allergies:    Allergies  Allergen Reactions  . Zithromax [Azithromycin] Nausea And Vomiting    Social History:   Social History   Socioeconomic History  . Marital status: Single    Spouse name: Not on file  . Number of children: Not on file  . Years of education: Not on file  . Highest education level: Not on file  Occupational History  . Not on file  Tobacco Use  . Smoking status: Never Smoker  . Smokeless tobacco: Never Used  Vaping Use  . Vaping Use: Never used  Substance and Sexual Activity  . Alcohol use: No  . Drug use: No  . Sexual activity: Never  Other Topics Concern  . Not on file  Social History Narrative  . Not on file   Social Determinants of Health   Financial Resource Strain:   . Difficulty of Paying Living Expenses: Not on file  Food Insecurity:   . Worried About Charity fundraiser in the Last Year: Not on file  . Ran Out of Food in the Last Year: Not on file  Transportation Needs:   . Lack of Transportation (Medical): Not on file  . Lack of Transportation (Non-Medical): Not on file  Physical Activity:   . Days of Exercise per Week: Not on file  . Minutes of Exercise per Session: Not on file  Stress:   . Feeling of Stress : Not on file  Social Connections:   . Frequency of Communication with Friends and Family: Not on file  . Frequency of Social Gatherings with Friends and Family: Not on file  . Attends Religious Services: Not on file  . Active Member of Clubs or Organizations: Not on file  . Attends Archivist Meetings: Not  on file  . Marital Status: Not on file  Intimate Partner Violence:   . Fear of Current or Ex-Partner: Not on file  . Emotionally Abused: Not on file  . Physically Abused: Not on file  . Sexually Abused: Not on file    Family History:    Family History  Problem Relation Age of Onset  . Hypertension Mother   . Heart disease Mother  CHF  . Stroke Mother   . Hyperlipidemia Father   . Stroke Father   . Hypertension Father   . Stroke Maternal Grandmother   . Colon cancer Unknown   . Breast cancer Unknown      ROS:  Per chart Please see the history of present illness.  General:no colds or fevers, no weight changes Skin:no rashes or ulcers HEENT:no blurred vision, no congestion CV:see HPI PUL:see HPI GI:+ diarrhea constipation or melena, no indigestion GU:no hematuria, no dysuria MS:no joint pain, no claudication Neuro:no syncope, no lightheadedness Endo:no diabetes, no thyroid disease  All other ROS reviewed and negative.     Physical Exam/Data:   Vitals:   01/02/20 0000 01/02/20 0158 01/02/20 0404 01/02/20 0738  BP:  126/83 113/68 125/81  Pulse:  (!) 125 (!) 113 (!) 59  Resp:  15 18 18   Temp:  98 F (36.7 C) 98.1 F (36.7 C) 98.7 F (37.1 C)  TempSrc:  Oral Oral Oral  SpO2: 98% 99% 99% 98%  Weight:      Height:        Intake/Output Summary (Last 24 hours) at 01/02/2020 0916 Last data filed at 01/02/2020 0757 Gross per 24 hour  Intake 455 ml  Output --  Net 455 ml   Last 3 Weights 12/30/2019 11/21/2018 09/23/2018  Weight (lbs) 198 lb 6.6 oz 199 lb 12.8 oz 202 lb  Weight (kg) 90 kg 90.629 kg 91.627 kg     Body mass index is 33.02 kg/m.   GEN: Well nourished, well developed, in no acute distress HEENT: normal Neck: no JVD Cardiac:RRR; no murmurs, rubs, or gallops,no edema  Respiratory:  clear to auscultation bilaterally, normal work of breathing GI: soft, nontender, nondistended MS: no deformity or atrophy Skin: warm and dry Neuro:  Alert and  Oriented x 3 Psych: full affect    Relevant CV Studies: Cardiac cath 04/2011 Procedural Findings: Hemodynamics RA 10 RV 49/13 PA 50/24 with a mean of 34 PCWP 16 LV 118/20 AO 117/58 with a mean of 80  Oxygen saturations: PA 66% AO 91%  Cardiac Output (Fick) 7.5 L per minute  Cardiac Index (Fick) 3.95 L per minute per meter square       Coronary angiography: Coronary dominance: right  Left mainstem: Patent with no significant obstructive disease. Divides into the LAD and left circumflex.  Left anterior descending (LAD): Mild nonobstructive stenosis in the mid LAD after the first diagonal branch. Estimated stenosis is 30-40%. Further segmental stenosis further down in the mid LAD estimated at 50%. The LAD wraps around the left ventricular apex and the distal LAD is free of significant stenosis.  Left circumflex (LCx): Large caliber vessel without significant stenosis. The vessel is smooth throughout its course. It supplies 2 obtuse marginal branches.  Right coronary artery (RCA): This is a dominant vessel with no significant stenosis. It supplies an RV marginal branch. It supplies a PDA and a posterolateral branch.  Left ventriculography: Left ventricular systolic function is normal, LVEF is estimated at 55-65%, there is no significant mitral regurgitation   Final Conclusions:   1. Mild pulmonary hypertension 2. Mild to moderate LAD stenosis 3. No significant right coronary artery or left circumflex stenoses 4. Normal cardiac output  Recommendations: Medical therapy for nonobstructive coronary artery disease. Continue treatment of congestive heart failure, but hemodynamics suggest adequate diuresis with normal pulmonary capillary wedge pressure.  ECHO 09/2012 Study Conclusions   - Left ventricle: Diffuse hypokinesis worse in the inferior  base The cavity  size was mildly dilated. Wall thickness  was increased in a pattern of mild LVH. Systolic function   was mildly reduced. The estimated ejection fraction was in  the range of 45% to 50%.  - Left atrium: The atrium was mildly dilated.  - Right atrium: The atrium was mildly dilated.  - Tricuspid valve: Mild-moderate regurgitation.  - Pulmonary arteries: PA peak pressure: 70mm Hg (S).  Transthoracic echocardiography. M-mode, complete 2D,    Laboratory Data:  High Sensitivity Troponin:  No results for input(s): TROPONINIHS in the last 720 hours.   Chemistry Recent Labs  Lab 01-26-2020 0523 01/01/20 1930 01/02/20 0425  NA 140 143 144  K 4.3 3.4* 3.1*  CL 113* 104 107  CO2 14* 22 25  GLUCOSE 125* 136* 115*  BUN 77* 56* 56*  CREATININE 4.17* 3.09* 3.05*  CALCIUM 8.7* 9.1 8.6*  GFRNONAA 10* 15* 15*  GFRAA 12* 17* 17*  ANIONGAP 13 17* 12    Recent Labs  Lab 01/26/2020 0523 01/01/20 1930 01/02/20 0425  PROT 8.4* 7.4 6.7  ALBUMIN 3.1* 3.2* 2.7*  AST 18 18 15   ALT 13 13 11   ALKPHOS 96 84 75  BILITOT 0.4 0.5 0.6   Hematology Recent Labs  Lab 01/26/20 0523 01/01/20 1930 01/02/20 0425  WBC 3.6* 9.6 8.8  RBC 4.48 4.73 4.15  HGB 12.0 12.7 11.2*  HCT 38.7 40.2 35.4*  MCV 86.4 85.0 85.3  MCH 26.8 26.8 27.0  MCHC 31.0 31.6 31.6  RDW 14.7 14.6 14.6  PLT 385 488* 450*   BNP Recent Labs  Lab 12/30/19 1859  BNP 31.6    DDimer  Recent Labs  Lab 01-26-20 0523 01/01/20 1930 01/02/20 0425  DDIMER 1.90* 1.68* 1.78*     Radiology/Studies:  US RENAL  Result Date: 26-Jan-2020 CLINICAL DATA:  Acute kidney injury EXAM: RENAL / URINARY TRACT ULTRASOUND COMPLETE COMPARISON:  Abdominal CT 01/11/2017 FINDINGS: Right Kidney: Renal measurements: 9 x 4.6 x 5.4 cm = volume: 115 mL. Echogenicity is mildly increased. No mass or hydronephrosis visualized. Left Kidney: Renal measurements: 9 x 5 x 5 cm = volume: 125 mL. Echogenicity is mildly increased. No solid mass or hydronephrosis visualized. Upper pole simple cyst measuring 2 cm Bladder: Appears normal for degree of bladder  distention. IMPRESSION: 1. Medical renal disease without asymmetry/atrophy. 2. Incidental in simple left upper pole cyst. Electronically Signed   By: Monte Fantasia M.D.   On: 2020-01-26 07:33   DG Chest Portable 1 View  Result Date: 12/30/2019 CLINICAL DATA:  Cough, COVID-19 exposure EXAM: PORTABLE CHEST 1 VIEW COMPARISON:  04/17/2011 chest radiograph. FINDINGS: Stable cardiomediastinal silhouette with normal heart size. No pneumothorax. No pleural effusion. Faint patchy hazy opacities in the peripheral left mid lung and lower right lung. No pulmonary edema. IMPRESSION: Faint patchy hazy opacities in the peripheral left mid lung and lower right lung, cannot exclude atypical/viral pneumonia, although the differential includes mild scarring or atelectasis. Electronically Signed   By: Ilona Sorrel M.D.   On: 12/30/2019 13:45       no chest pain     Assessment and Plan:   1. Atrial fib with RVR - converted to sinus rhythm. CHA2DS2VASc 4, would recommend IV heparin for now. Repeat echo. Continue PO dilt.  Has been in and out of AF, will add PO amio to maintain sinus rhyth,. 2. CAD non obstructive with cath 2012.  3. Hx of CHF in 2012 in combination with hypertensive urgency  4. COVID 19 PNA Per IM  5. Hx of SVT once in 2015.  6. HTN on dilt and hydralazine bp 125/81    For questions or updates, please contact [place] Please consult [email or web address] for contact info under    Signed, Cecilie Kicks, NP  01/02/2020 9:16 AM   Patient seen and examined.  Agree with above documentation.  Ms. Lyne is a 69 year old female with a history of nonobstructive CAD, SVT, CKD stage IV, hypertension, lupus who we are consulted to see by Dr. Dwyane Dee for evaluation of atrial fibrillation.  She presented with diarrhea and cough on 8/21, was found to be Covid positive.  She did receive the Covid vaccine in February.  On presentation, vital signs significant for SPO2 91% on room air, BP 147/59, pulse 72.   Labs notable for creatinine 5.77 (increased from 2.89 on 7/13), WBC 8.2, hemoglobin 11.4, BNP 32.  Creatinine has been improving with IV fluids, 3.05 today.  EKG shows atrial fibrillation, rate 151, PVC, LVH with repolarization abnormalities, poor R wave progression.  Telemetry personally reviewed and shows atrial fibrillation up to rates of 150s, currently in normal sinus rhythm with rate 50s to 60s.  On exam, patient is alert and oriented, regular rate and rhythm, no murmurs, lungs CTAB, no LE edema or JVD.    For her atrial fibrillation, currently on diltiazem 30 mg every 6 hours.  Will check echocardiogram.  Will add PO amiodarone to maintain sinus rhythm, as she has been converting in and out of AF.  Recommend starting anticoagulation with heparin drip.  Donato Heinz, MD

## 2020-01-02 NOTE — Progress Notes (Signed)
   01/02/20 0158  Assess: MEWS Score  Temp 98 F (36.7 C)  BP 126/83  Pulse Rate (!) 125  Resp 15  Level of Consciousness Alert  SpO2 99 %  O2 Device Nasal Cannula  O2 Flow Rate (L/min) 2 L/min  Assess: MEWS Score  MEWS Temp 0  MEWS Systolic 0  MEWS Pulse 2  MEWS RR 0  MEWS LOC 0  MEWS Score 2  MEWS Score Color Yellow  Assess: if the MEWS score is Yellow or Red  Were vital signs taken at a resting state? Yes  Focused Assessment No change from prior assessment  Early Detection of Sepsis Score *See Row Information* Medium  MEWS guidelines implemented *See Row Information* No, previously yellow, continue vital signs every 4 hours  Treat  MEWS Interventions Administered prn meds/treatments  Pain Score 0  Take Vital Signs  Increase Vital Sign Frequency  Yellow: Q 2hr X 2 then Q 4hr X 2, if remains yellow, continue Q 4hrs  Escalate  MEWS: Escalate Yellow: discuss with charge nurse/RN and consider discussing with provider and RRT  Notify: Charge Nurse/RN  Name of Charge Nurse/RN Notified Renee, RN   Document  Patient Outcome Stabilized after interventions

## 2020-01-02 NOTE — Progress Notes (Signed)
[facility] Progress Note  Subjective:  Patient not examined today directly given COVID-19 + status, utilizing data taken from chart +/- discussions w/ providers and staff.    Vitals:   01/02/20 0404 01/02/20 0738 01/02/20 1146 01/02/20 1424  BP: 113/68 125/81 140/63 (!) 147/58  Pulse: (!) 113 (!) 59 (!) 53 (!) 52  Resp: 18 18 17 18   Temp: 98.1 F (36.7 C) 98.7 F (37.1 C) 97.7 F (36.5 C) 98.2 F (36.8 C)  TempSrc: Oral Oral Oral Oral  SpO2: 99% 98% 97% 98%  Weight:      Height:        Exam:  Patient not examined today directly given COVID-19 + status, utilizing data taken from chart +/- discussions w/ providers and staff.    8/21 CXR - IMPRESSION: Faint patchy hazy opacities in the peripheral left mid lung and lower right lung, cannot exclude atypical/viral pneumonia, although the differential includes mild scarring or atelectasis.  Assessment/ Plan: 1. AoCKD 4 - b/l creat 3- 3.5 from our office.  Peak creat here was 5.7 on admit 8/21 >  4.17 yest > 3.0 today. Prob dehydration as cause of AKI, less likely COVID AKI/ ATN. She is stable from renal standpoint (albeit CKD IV w/ COVID infection), with good UOP and BP's and stable renal fxn at her baseline. We will sign off for now, please call as needed.  2. COVID PNA - symptomatic for about 2 weeks.  SpO2 is good on RA, CXR showed bilat infiltrates.  Is vaccinated. Getting IV decadron and remdesivir per pmd.  3. Gen weakness - due to dehydration, COVID infection, AKI 4. H/o PSVT - getting cardizem short acting here 5. HTN - good BP's, cont meds     Rob Destane Speas 01/02/2020, 4:17 PM   Recent Labs  Lab 01/01/20 1930 01/02/20 0425  K 3.4* 3.1*  BUN 56* 56*  CREATININE 3.09* 3.05*  CALCIUM 9.1 8.6*  PHOS 3.5 4.5  HGB 12.7 11.2*   Inpatient medications: . vitamin C  500 mg Oral Daily  . calcium acetate  667 mg Oral TID WC  . dexamethasone (DECADRON) injection  6 mg Intravenous Daily  . diltiazem  30 mg  Oral Q6H  . heparin  4,000 Units Intravenous Once  . hydrALAZINE  50 mg Oral Q8H  . Ipratropium-Albuterol  1 puff Inhalation Q6H  . pantoprazole  40 mg Oral Daily  . zinc sulfate  220 mg Oral Daily   . sodium chloride    . heparin    . remdesivir 100 mg in NS 100 mL 100 mg (01/02/20 1049)   sodium chloride, acetaminophen, chlorpheniramine-HYDROcodone, guaiFENesin-dextromethorphan, metoprolol tartrate, ondansetron **OR** ondansetron (ZOFRAN) IV, oxyCODONE, zolpidem

## 2020-01-02 NOTE — Progress Notes (Signed)
   01/01/20 2357  Assess: MEWS Score  Temp 98.4 F (36.9 C)  BP 113/77  Pulse Rate (!) 157  Resp 20  SpO2 95 %  O2 Device Room Air  Assess: MEWS Score  MEWS Temp 0  MEWS Systolic 0  MEWS Pulse 3  MEWS RR 0  MEWS LOC 0  MEWS Score 3  MEWS Score Color Yellow  Assess: if the MEWS score is Yellow or Red  Were vital signs taken at a resting state? Yes  Focused Assessment Change from prior assessment (see assessment flowsheet)  Early Detection of Sepsis Score *See Row Information* Medium  MEWS guidelines implemented *See Row Information* Yes  Treat  MEWS Interventions Administered scheduled meds/treatments  Pain Scale 0-10  Pain Score 0  Take Vital Signs  Increase Vital Sign Frequency  Yellow: Q 2hr X 2 then Q 4hr X 2, if remains yellow, continue Q 4hrs  Notify: Charge Nurse/RN  Name of Charge Nurse/RN Notified Renee, RN   Date Charge Nurse/RN Notified 01/02/20  Time Charge Nurse/RN Notified 0030  Notify: Provider  Provider Name/Title Ardith Dark   Notification Type Page  Notification Reason Change in status  Response See new orders  YELLOW MEWS triggered, EKG done , scheduled cardizem given. Will monitor & treat accordingly

## 2020-01-02 NOTE — Progress Notes (Signed)
MD paged to ask about holding cardizem. Patients HR was manually checked d/t inconsistencies in monitor. HR was 59. Med held.

## 2020-01-02 NOTE — Progress Notes (Signed)
approx 0605 am pts HR converted from afib to a rate 55-65 but by 0645 HR was in the >100 range afib

## 2020-01-02 NOTE — Progress Notes (Signed)
PROGRESS NOTE    Brittany Charles  OXB:[id number] DOB: 1950-06-15 DOA: 12/30/2019 PCP: Wendie Agreste, MD    Brief Narrative: Brittany Charles is a 69 y.o. female with history of CKD-4, CHF unknown type, CAD, HTN, lupus, gout and arthritis presenting with generalized weakness, productive cough and diarrhea. Patient reports cough for the last 2 weeks.  She developed watery diarrhea earlier this morning.  Also felt very weak and lightheaded that prompted her to come to ED.  She denies fever, chills, hemoptysis, denies chest pain, dyspnea, orthopnea, PND or edema. she denies nausea or vomiting but reports dysphagia with solids.   She had nasal congestion for few days.  She says she had multiple family members who tested positive for COVID-19 recently.  She is fully vaccinated against COVID-19.  She had both shots of Pfizer in February 2021.  She denies headache, vision change, myalgia, loss of taste or smell, UTI symptoms or focal neuro symptoms.  She has not noticed changes to urine output.  She has not taken her Lasix in the last 2 to 3 days. She denies NSAID use.  Patient is followed by Dr. Johnney Ou from [facility]. In ED, COVID-19 PCR positive. Cr 5.77 (2.89 in 11/2018).  BUN 98.  K3.7.  Bicarb 18.  Anion gap 18.  Hgb 11.4.  Lipase was normal.  Otherwise, CMP and CBC without significant finding.  Chest x-ray concerning for slight bilateral infiltrate.  Patient was started on Decadron and IV fluids.    She is admitted for AKI on CKD stage IV with metabolic acidosis.  Nephrology consulted,  recommended sodium bicarb infusion,  metabolic acidosis resolved, Patient developed atrial fibrillation with RVR with tachy brady,  cardiology consulted,  started on amiodarone and heparin drip given high CHADS2 score. She is making good improvement with regards to COVID.   Assessment & Plan:   Active Problems:   AKI (acute kidney injury) ([place])  Acute kidney injury on CKD-4 with azotemia  >>>  Improving  Suspect prerenal etiology in the setting of poor p.o. intake, diarrhea and diuretics.   Could have some degree of ATN from COVID-19 infection.   Patient is followed by Dr. Johnney Ou at [facility]. Cr 5.77 (2.89 in 11/2018).  BUN 98.   GI symptoms could be due to Covid or uremia. -ContinueD IV NS at 125 cc an hour for the next 24 hours. -Renal ultrasound: medical renal disease.  -Monitor urine output. -home Lasix on hold -Nephrology consulted, Dr. Joylene Grapes recommended HCO3 infusion, renal functions improving.  -  Anion gap metabolic acidosis: >>>   Resolved.    POA : Bicarb 18.  Anion gap 18.  Likely due to azotemia/renal failure. - DC  NaHCO3 Infusion. -Continue monitoring  Atrial fibrillation with tachybradycardia: Cardiology consulted,  patient has A. fib with variable heart rate. Recommended amiodarone and heparin drip for high CHADS2 score. Obtain 2D echo.   COVID-19 pneumonia- symptomatic for about 2 weeks.   Maintaining good saturation on room air.   CXR with bilateral infiltrates.  Inflammatory markers. She completed 2 series pfizer vaccine in February 2021. -Continue Decadron.  continue  remdesivir. -Monitor inflammatory markers -Incentive spirometry, OOB/PT/OT -Mucolytic's and antitussives.  Chronic CHF-unknown type.  No CHF symptoms.  She is on Lasix at home. -Monitor fluid status while on IV fluid. -Hold home Lasix - Obtain 2D Echo   Diarrhea: Could be due to COVID-19 or uremia.  Abdominal exam is benign. -IV fluid as above.  Dysphagia with solids -May  need barium swallow eval if it does not improve with treatment of renal failure and Covid.  Generalized weakness/lightheadedness-likely due to dehydration, renal failure and COVID-19. -Treat treatable causes -PT/OT eval  History of PSVT: She is now sinus rhythm at 70.  On Cardizem CD 360 mg at home. -Will use Cardizem 30 mg every 6 hours  Essential hypertension: BP within fair range. -Continue Home  medications.  Anemia of renal disease:  Hb stable.   History of diabetes?-Does not take medications.  May be diet controlled. -Check hemoglobin A1c  History of lupus?  Does not seem to be on medication.   DVT prophylaxis:  heparin   gtt  Code Status: Full code Family Communication: Updated patient's sister over the phone.         Disposition Plan:  Home with Home PT/ SNF Anticipated DC home in 1-2 days    Consultants:   Nephrology, Cardiology  Procedures:  Antimicrobials:  Anti-infectives (From admission, onward)   Start     Dose/Rate Route Frequency Ordered Stop   12/31/19 1000  remdesivir 100 mg in sodium chloride 0.9 % 100 mL IVPB       "Followed by" Linked Group Details   100 mg 200 mL/hr over 30 Minutes Intravenous Daily 12/30/19 1608 01/04/20 0959   12/31/19 0900  cefTRIAXone (ROCEPHIN) 1 g in sodium chloride 0.9 % 100 mL IVPB  Status:  Discontinued        1 g 200 mL/hr over 30 Minutes Intravenous Every 24 hours 12/31/19 0848 01/02/20 1207   12/30/19 1800  remdesivir 200 mg in sodium chloride 0.9% 250 mL IVPB       "Followed by" Linked Group Details   200 mg 580 mL/hr over 30 Minutes Intravenous Once 12/30/19 1608 12/30/19 1801     Subjective: Patient was seen and examined at bedside.  Overnight events noted.  Patient had A. fib with tachy and bradycardia.   Patient denies any chest pain.  she reports feeling better with regards to her breathing.  Objective: Vitals:   01/02/20 0404 01/02/20 0738 01/02/20 1146 01/02/20 1424  BP: 113/68 125/81 140/63 (!) 147/58  Pulse: (!) 113 (!) 59 (!) 53 (!) 52  Resp: 18 18 17 18   Temp: 98.1 F (36.7 C) 98.7 F (37.1 C) 97.7 F (36.5 C) 98.2 F (36.8 C)  TempSrc: Oral Oral Oral Oral  SpO2: 99% 98% 97% 98%  Weight:      Height:        Intake/Output Summary (Last 24 hours) at 01/02/2020 1449 Last data filed at 01/02/2020 1230 Gross per 24 hour  Intake 510 ml  Output --  Net 510 ml   Filed Weights   12/30/19  1326  Weight: 90 kg    Examination:  General exam: Appears calm and comfortable  Respiratory system: Clear to auscultation. Respiratory effort normal. Cardiovascular system: S1 & S2 heard, RRR. No JVD, murmurs, rubs, gallops or clicks. No pedal edema. Gastrointestinal system: Abdomen is nondistended, soft and nontender. No organomegaly or masses felt. Normal bowel sounds heard. Central nervous system: Alert and oriented. No focal neurological deficits. Extremities: Symmetric 5 x 5 power. Skin: No rashes, lesions or ulcers Psychiatry: Judgement and insight appear normal. Mood & affect appropriate.     Data Reviewed: I have personally reviewed following labs and imaging studies  CBC: Recent Labs  Lab 12/30/19 1217 12/31/19 0523 01/01/20 1930 01/02/20 0425  WBC 8.2 3.6* 9.6 8.8  NEUTROABS 5.3 2.7 8.1* 7.1  HGB 11.4* 12.0  12.7 11.2*  HCT 35.6* 38.7 40.2 35.4*  MCV 86.2 86.4 85.0 85.3  PLT 383 385 488* 542*   Basic Metabolic Panel: Recent Labs  Lab 12/30/19 1217 12/31/19 0523 01/01/20 1930 01/02/20 0425  NA 139 140 143 144  K 3.7 4.3 3.4* 3.1*  CL 103 113* 104 107  CO2 18* 14* 22 25  GLUCOSE 111* 125* 136* 115*  BUN 98* 77* 56* 56*  CREATININE 5.77* 4.17* 3.09* 3.05*  CALCIUM 8.7* 8.7* 9.1 8.6*  MG  --  2.5* 1.8 1.8  PHOS  --  5.4* 3.5 4.5   GFR: Estimated Creatinine Clearance: 19.6 mL/min (A) (by C-G formula based on SCr of 3.05 mg/dL (H)). Liver Function Tests: Recent Labs  Lab 12/30/19 1217 12/31/19 0523 01/01/20 1930 01/02/20 0425  AST 19 18 18 15   ALT 14 13 13 11   ALKPHOS 90 96 84 75  BILITOT 0.7 0.4 0.5 0.6  PROT 8.2* 8.4* 7.4 6.7  ALBUMIN 3.3* 3.1* 3.2* 2.7*   Recent Labs  Lab 12/30/19 1217  LIPASE 34   No results for input(s): AMMONIA in the last 168 hours. Coagulation Profile: No results for input(s): INR, PROTIME in the last 168 hours. Cardiac Enzymes: No results for input(s): CKTOTAL, CKMB, CKMBINDEX, TROPONINI in the last 168  hours. BNP (last 3 results) No results for input(s): PROBNP in the last 8760 hours. HbA1C: Recent Labs    12/31/19 0523  HGBA1C 6.5*   CBG: No results for input(s): GLUCAP in the last 168 hours. Lipid Profile: No results for input(s): CHOL, HDL, LDLCALC, TRIG, CHOLHDL, LDLDIRECT in the last 72 hours. Thyroid Function Tests: No results for input(s): TSH, T4TOTAL, FREET4, T3FREE, THYROIDAB in the last 72 hours. Anemia Panel: Recent Labs    01/01/20 1930 01/02/20 0425  FERRITIN 241 220   Sepsis Labs: No results for input(s): PROCALCITON, LATICACIDVEN in the last 168 hours.  Recent Results (from the past 240 hour(s))  SARS Coronavirus 2 by RT PCR (hospital order, performed in [facility] hospital lab) Nasopharyngeal Nasopharyngeal Swab     Status: Abnormal   Collection Time: 12/30/19 12:35 PM   Specimen: Nasopharyngeal Swab  Result Value Ref Range Status   SARS Coronavirus 2 POSITIVE (A) NEGATIVE Final    Comment: RESULT CALLED TO, READ BACK BY AND VERIFIED WITH: A.LEADWELL,RN [id number] @1437  BY V.WILKINS (NOTE) SARS-CoV-2 target nucleic acids are DETECTED  SARS-CoV-2 RNA is generally detectable in upper respiratory specimens  during the acute phase of infection.  Positive results are indicative  of the presence of the identified virus, but do not rule out bacterial infection or co-infection with other pathogens not detected by the test.  Clinical correlation with patient history and  other diagnostic information is necessary to determine patient infection status.  The expected result is negative.  Fact Sheet for Patients:   StrictlyIdeas.no   Fact Sheet for Healthcare Providers:   BankingDealers.co.za    This test is not yet approved or cleared by the [place] FDA and  has been authorized for detection and/or diagnosis of SARS-CoV-2 by FDA under an Emergency Use Authorization (EUA).  This EUA will remain in effect  (meaning th is test can be used) for the duration of  the COVID-19 declaration under Section 564(b)(1) of the Act, 21 U.S.C. section 360-bbb-3(b)(1), unless the authorization is terminated or revoked sooner.  Performed at [facility], [place] [address], [place], [place] [address]      Radiology Studies: No results found.  Scheduled  Meds: . vitamin C  500 mg Oral Daily  . calcium acetate  667 mg Oral TID WC  . dexamethasone (DECADRON) injection  6 mg Intravenous Daily  . diltiazem  30 mg Oral Q6H  . heparin  5,000 Units Subcutaneous Q8H  . hydrALAZINE  50 mg Oral Q8H  . Ipratropium-Albuterol  1 puff Inhalation Q6H  . pantoprazole  40 mg Oral Daily  . zinc sulfate  220 mg Oral Daily   Continuous Infusions: . sodium chloride    . remdesivir 100 mg in NS 100 mL 100 mg (01/02/20 1049)     LOS: 3 days    Time spent: 35 mins    Chapel Silverthorn, MD [facility]   If 7PM-7AM, please contact night-coverage

## 2020-01-02 NOTE — Progress Notes (Signed)
ANTICOAGULATION CONSULT NOTE - Initial Consult  Pharmacy Consult for Heparin Indication: atrial fibrillation  Allergies  Allergen Reactions  . Zithromax [Azithromycin] Nausea And Vomiting    Patient Measurements: Height: 5\' 5"  (165.1 cm) Weight: 90 kg (198 lb 6.6 oz) IBW/kg (Calculated) : 57 Heparin Dosing Weight: 77 kg  Vital Signs: Temp: 98.2 F (36.8 C) (08/24 1424) Temp Source: Oral (08/24 1424) BP: 147/58 (08/24 1424) Pulse Rate: 52 (08/24 1424)  Labs: Recent Labs    12/31/19 0523 12/31/19 0523 01/01/20 1930 01/02/20 0425  HGB 12.0   < > 12.7 11.2*  HCT 38.7  --  40.2 35.4*  PLT 385  --  488* 450*  CREATININE 4.17*  --  3.09* 3.05*   < > = values in this interval not displayed.    Estimated Creatinine Clearance: 19.6 mL/min (A) (by C-G formula based on SCr of 3.05 mg/dL (H)).   Medical History: Past Medical History:  Diagnosis Date  . Arthritis   . CHF (congestive heart failure) ([place])   . Fibroids   . Hypertension   . Lupus (systemic lupus erythematosus) ([place])   . Myocardial infarction ([place])   . Renal insufficiency     Medications:  Scheduled:  . vitamin C  500 mg Oral Daily  . calcium acetate  667 mg Oral TID WC  . dexamethasone (DECADRON) injection  6 mg Intravenous Daily  . diltiazem  30 mg Oral Q6H  . heparin  5,000 Units Subcutaneous Q8H  . hydrALAZINE  50 mg Oral Q8H  . Ipratropium-Albuterol  1 puff Inhalation Q6H  . pantoprazole  40 mg Oral Daily  . zinc sulfate  220 mg Oral Daily   Infusions:  . sodium chloride    . remdesivir 100 mg in NS 100 mL 100 mg (01/02/20 1049)    Assessment: 7 yoF admitted on 12/30/19 with COVID-19, [name]  CHA2DS2VASc 4.  Pharmacy is consulted for IV heparin. No prior to admission anticoagulants. Inpatient anticoagulation with Heparin [place] 5000 units q8h for VTE prophylaxis.  Most recent dose on 8/24 at 13:37 RN reports patient is a "hard stick" and has poor IV  access. Hx AoCKD 4 (baseline SCr 3- 3.5), currently SCr 3.05 Hgb 11.2, Plt 450  Goal of Therapy:  Heparin level 0.3-0.7 units/ml Monitor platelets by anticoagulation protocol: Yes   Plan:  D/C Heparin [place] Baseline PTT, PT/INR Give heparin 4000 units bolus IV x 1 Start heparin IV infusion at 1100 units/hr Heparin level 8 hours after starting Daily heparin level and CBC Continue to monitor H&H and platelets    Gretta Arab PharmD, BCPS Clinical Pharmacist [facility] main pharmacy [phone] 01/02/2020 3:01 PM

## 2020-01-02 NOTE — Evaluation (Signed)
Occupational Therapy Evaluation Patient Details Name: Brittany Charles MRN: [id number] DOB: Dec 20, 1950 Today's Date: 01/02/2020    History of Present Illness Brittany Charles is a 69 y.o. female with history of CKD-4, CHF, CAD, HTN, lupus, gout and arthritis presenting 12/30/19 with generalized weakness, productive cough and diarrhea. Tested positive for covid,  has been vaccinated.   Clinical Impression   Patient evaluated by Occupational Therapy with no further acute OT needs identified. All education has been completed and the patient has no further questions.  See below for any follow-up Occupational Therapy or equipment needs. OT is signing off. Thank you for this referral.     Follow Up Recommendations  No OT follow up    Equipment Recommendations  Tub/shower seat    Recommendations for Other Services       Precautions / Restrictions Precautions Precautions: Fall Precaution Comments: Airborn/Contact Restrictions Weight Bearing Restrictions: No      Mobility Bed Mobility Overal bed mobility: Modified Independent             General bed mobility comments: Supine to sit with HOB partially elevated Mod I.  Transfers Overall transfer level: Independent                    Balance Overall balance assessment: Mild deficits observed, not formally tested                                         ADL either performed or assessed with clinical judgement   ADL Overall ADL's : At baseline                                       General ADL Comments: Pt confirms that she is perfomring her ADLs at baseline and her adult grandson will be able to assist her as needed.     Vision Baseline Vision/History: Wears glasses Wears Glasses: At all times Patient Visual Report: No change from baseline Vision Assessment?: No apparent visual deficits     Perception     Praxis      Pertinent Vitals/Pain Pain Assessment: No/denies pain      Hand Dominance Right   Extremity/Trunk Assessment Upper Extremity Assessment Upper Extremity Assessment: Overall WFL for tasks assessed   Lower Extremity Assessment Lower Extremity Assessment: Defer to PT evaluation   Cervical / Trunk Assessment Cervical / Trunk Assessment: Normal   Communication Communication Communication: No difficulties   Cognition Arousal/Alertness: Awake/alert Behavior During Therapy: WFL for tasks assessed/performed Overall Cognitive Status: Within Functional Limits for tasks assessed                                     General Comments  Sitting S/D: Good/Good.  Standing S/D: Good/Fair    Exercises     Shoulder Instructions      Home Living Family/patient expects to be discharged to:: Private residence Living Arrangements: Other relatives;Spouse/significant other Available Help at Discharge: Family Type of Home: House Home Access: Stairs to enter [employer] of Steps: 2 Entrance Stairs-Rails: Can reach both Home Layout: One level     Bathroom Shower/Tub: Teacher, early years/pre: Standard     Home Equipment: Cane - single point;Grab bars - tub/shower;Walker -  2 wheels   Additional Comments: states grandson stays with her days.      Prior Functioning/Environment Level of Independence: Independent;Independent with assistive device(s)        Comments: uses cane with gout flare, did not use RW but states there is one available, drives, was to pick up great grandchiildren from school        OT Problem List:        OT Treatment/Interventions:      OT Goals(Current goals can be found in the care plan section) Acute Rehab OT Goals Patient Stated Goal: to go home OT Goal Formulation: With patient Time For Goal Achievement: 01/16/20 Potential to Achieve Goals: Good  OT Frequency:     Barriers to D/C:            Co-evaluation              AM-PAC OT "6 Clicks" Daily Activity      Outcome Measure Help from another person eating meals?: None Help from another person taking care of personal grooming?: None Help from another person toileting, which includes using toliet, bedpan, or urinal?: A Little Help from another person bathing (including washing, rinsing, drying)?: A Little Help from another person to put on and taking off regular upper body clothing?: None Help from another person to put on and taking off regular lower body clothing?: A Little 6 Click Score: 21   End of Session    Activity Tolerance: Patient tolerated treatment well Patient left: in chair;with call bell/phone within reach  OT Visit Diagnosis: Other abnormalities of gait and mobility (R26.89)                Time:  -    Charges:  OT General Charges $OT Visit: 1 Visit OT Evaluation $OT Eval Low Complexity: 1 Low  Kenshawn Maciolek, OT Acute Rehab Services Office: [phone] 01/02/2020   Julien Girt 01/02/2020, 12:48 PM

## 2020-01-03 ENCOUNTER — Inpatient Hospital Stay (HOSPITAL_COMMUNITY): Payer: Medicare HMO

## 2020-01-03 DIAGNOSIS — I4891 Unspecified atrial fibrillation: Secondary | ICD-10-CM

## 2020-01-03 LAB — CBC WITH DIFFERENTIAL/PLATELET
Abs Immature Granulocytes: 0.08 10*3/uL — ABNORMAL HIGH (ref 0.00–0.07)
Basophils Absolute: 0 10*3/uL (ref 0.0–0.1)
Basophils Relative: 0 %
Eosinophils Absolute: 0 10*3/uL (ref 0.0–0.5)
Eosinophils Relative: 0 %
HCT: 35.3 % — ABNORMAL LOW (ref 36.0–46.0)
Hemoglobin: 11.3 g/dL — ABNORMAL LOW (ref 12.0–15.0)
Immature Granulocytes: 1 %
Lymphocytes Relative: 9 %
Lymphs Abs: 0.6 10*3/uL — ABNORMAL LOW (ref 0.7–4.0)
MCH: 27.5 pg (ref 26.0–34.0)
MCHC: 32 g/dL (ref 30.0–36.0)
MCV: 85.9 fL (ref 80.0–100.0)
Monocytes Absolute: 0.2 10*3/uL (ref 0.1–1.0)
Monocytes Relative: 3 %
Neutro Abs: 6 10*3/uL (ref 1.7–7.7)
Neutrophils Relative %: 87 %
Platelets: 424 10*3/uL — ABNORMAL HIGH (ref 150–400)
RBC: 4.11 MIL/uL (ref 3.87–5.11)
RDW: 14.8 % (ref 11.5–15.5)
WBC: 6.9 10*3/uL (ref 4.0–10.5)
nRBC: 0 % (ref 0.0–0.2)

## 2020-01-03 LAB — FERRITIN: Ferritin: 230 ng/mL (ref 11–307)

## 2020-01-03 LAB — COMPREHENSIVE METABOLIC PANEL
ALT: 16 U/L (ref 0–44)
AST: 27 U/L (ref 15–41)
Albumin: 2.7 g/dL — ABNORMAL LOW (ref 3.5–5.0)
Alkaline Phosphatase: 77 U/L (ref 38–126)
Anion gap: 12 (ref 5–15)
BUN: 59 mg/dL — ABNORMAL HIGH (ref 8–23)
CO2: 22 mmol/L (ref 22–32)
Calcium: 8.4 mg/dL — ABNORMAL LOW (ref 8.9–10.3)
Chloride: 106 mmol/L (ref 98–111)
Creatinine, Ser: 3.03 mg/dL — ABNORMAL HIGH (ref 0.44–1.00)
GFR calc Af Amer: 18 mL/min — ABNORMAL LOW (ref >60)
GFR calc non Af Amer: 15 mL/min — ABNORMAL LOW (ref >60)
Glucose, Bld: 166 mg/dL — ABNORMAL HIGH (ref 70–99)
Potassium: 3.8 mmol/L (ref 3.5–5.1)
Sodium: 140 mmol/L (ref 135–145)
Total Bilirubin: 0.5 mg/dL (ref 0.3–1.2)
Total Protein: 6.7 g/dL (ref 6.5–8.1)

## 2020-01-03 LAB — ECHOCARDIOGRAM COMPLETE
Area-P 1/2: 1.82 cm2
Height: 65 in
S' Lateral: 2.4 cm
Weight: 3174.62 oz

## 2020-01-03 LAB — PHOSPHORUS: Phosphorus: 3.4 mg/dL (ref 2.5–4.6)

## 2020-01-03 LAB — C-REACTIVE PROTEIN: CRP: 1.2 mg/dL — ABNORMAL HIGH (ref <1.0)

## 2020-01-03 LAB — D-DIMER, QUANTITATIVE: D-Dimer, Quant: 1.36 ug/mL-FEU — ABNORMAL HIGH (ref 0.00–0.50)

## 2020-01-03 LAB — HEPARIN LEVEL (UNFRACTIONATED)
Heparin Unfractionated: 0.98 IU/mL — ABNORMAL HIGH (ref 0.30–0.70)
Heparin Unfractionated: 0.88 IU/mL — ABNORMAL HIGH (ref 0.30–0.70)

## 2020-01-03 LAB — MAGNESIUM: Magnesium: 1.7 mg/dL (ref 1.7–2.4)

## 2020-01-03 MED ORDER — HEPARIN (PORCINE) 25000 UT/250ML-% IV SOLN
700.0000 [IU]/h | INTRAVENOUS | Status: DC
  Administered 2020-01-03: 600 [IU]/h via INTRAVENOUS
  Administered 2020-01-03: 900 [IU]/h via INTRAVENOUS
  Filled 2020-01-03: qty 250

## 2020-01-03 NOTE — Progress Notes (Signed)
  Echocardiogram 2D Echocardiogram has been performed.  Brittany Charles 01/03/2020, 9:05 AM

## 2020-01-03 NOTE — Progress Notes (Signed)
[facility]                                                                              Patient Demographics  Brittany Charles, is a 69 y.o. female, DOB - 11-30-1950, YPP:[id number]  Admit date - 12/30/2019   Admitting Physician Mercy Riding, MD  Outpatient Primary MD for the patient is Wendie Agreste, MD  Outpatient specialists:   LOS - 4  days   Medical records reviewed and are as summarized below:    Chief Complaint  Patient presents with   Cough   Diarrhea       Brief summary   Brittany B Mitchellis a 69 y.o.femalewith history ofCKD-4, CHF unknown type, CAD, HTN, lupus, goutandarthritis presented with generalized weakness, productive cough and diarrhea. Patient reports cough for the last 2 weeks.  She developed watery diarrhea earlier this morning. Also felt very weak and lightheaded that prompted her to come to ED. denied any fever chills hemoptysis, chest pain, shortness of breath.  No nausea or vomiting. she had multiple family members who tested positive for COVID-19 recently. She is fully vaccinated against COVID-19.She had both shotsof Pfizerin February 2021. She denied headache, vision change, myalgia, loss of taste or smell, UTI symptoms or focal neuro symptoms. She has not noticed changes to urine output. She had not taken her Lasix in the last 2 to 3 days. She denies NSAID use.Patient is followed by Dr. Johnney Ou from [facility]. In ED, COVID-19 PCR positive. Cr5.77 (2.89 in 11/2018).BUN 98.K3.7. Bicarb 18. Anion gap 18. Hgb 11.4. Lipase was normal. Otherwise,CMP and CBC without significant finding. Chest x-ray concerning for slight bilateral infiltrate.Patient was started on Decadron and IV fluids.  Patient was admitted for AKI on CKD stage IV with metabolic acidosis.  Nephrology consulted,  recommended sodium bicarb infusion,  metabolic acidosis resolved, Patient developed atrial fibrillation with RVR with  tachy brady,  cardiology consulted,  started on amiodarone and heparin drip given high CHADS2 score.   Assessment & Plan    Principal problem Acute kidney injury on CKD-4 with azotemia  -Currently improving, likely prerenal etiology in the setting of poor p.o. intake, diuretics and diarrhea.  Some degree of ATN from COVID-19. - Patient is followed by Dr. Johnney Ou at [facility]. Cr5.77 (2.89 in 11/2018).BUN 98. GI symptoms could be due to Covid or uremia. -Patient was placed on IV fluid hydration.  Renal ultrasound did not show any obstruction or hydronephrosis.  Lasix was placed on hold -Nephrology was consulted.  Patient seen by Dr. Joylene Grapes recommended bicarb infusion. -Creatinine improving, 3.0, plateaued   Anion gap metabolic acidosis: Present on admission -Improved, patient was placed on bicarb infusion -.Likely due to azotemia/renal failure. -Bicarb infusion discontinued, currently stable  Atrial fibrillation with tachybradycardia:, New onset -Cardiology consulted, patient was placed on IV amiodarone and heparin drip  -Heart rate now controlled, in 60s, sinus rhythm.   -2D echo showed EF of 65 to 70%, no regional wall motion abnormalities  COVID-19 pneumonia- symptomatic for about 2 weeks. -Chest x-ray showed bilateral infiltrates, elevated inflammatory markers. -Patient reported completed 2 series of Pfizer vaccine in 06/2019 -Placed on  remdesivir, Decadron, inflammatory markers improving -Continue spirometry, PT OT   Chronic diastolic CHF  -2D echo showed EF of 65 to 70%, cardiology following   Diarrhea: Could be due to COVID-19 or uremia. Abdominal exam is benign. -Follow closely  Dysphagia with solids -Currently on heart healthy diet  Generalized weakness/lightheadedness-likely due to dehydration, renal failure and COVID-19. -Currently stable, PT evaluation done on 8/23, recommended no PT follow-up   Essential hypertension:  -BP currently stable  Anemia  of renal disease/CKD:   -H&H currently stable   History of diabetes?-Does not take medications.  -Diet controlled, hemoglobin A1c 6.5  History of lupus? Does not seem to be onmedication.    Obesity Estimated body mass index is 33.02 kg/m as calculated from the following:   Height as of this encounter: 5\' 5"  (1.651 m).   Weight as of this encounter: 90 kg.  Code Status: Full code DVT Prophylaxis: Heparin drip Family Communication: Discussed all imaging results, lab results, explained to the patient   Disposition Plan:     Status is: Inpatient  Remains inpatient appropriate because:Inpatient level of care appropriate due to severity of illness   Dispo: The patient is from: Home              Anticipated d/c is to: Home              Anticipated d/c date is: 2 days              Patient currently is not medically stable to d/c.      Time Spent in minutes   35   Procedures:  2D echo  Consultants:   Cardiology  Antimicrobials:   Anti-infectives (From admission, onward)   Start     Dose/Rate Route Frequency Ordered Stop   12/31/19 1000  remdesivir 100 mg in sodium chloride 0.9 % 100 mL IVPB       "Followed by" Linked Group Details   100 mg 200 mL/hr over 30 Minutes Intravenous Daily 12/30/19 1608 01/03/20 1126   12/31/19 0900  cefTRIAXone (ROCEPHIN) 1 g in sodium chloride 0.9 % 100 mL IVPB  Status:  Discontinued        1 g 200 mL/hr over 30 Minutes Intravenous Every 24 hours 12/31/19 0848 01/02/20 1207   12/30/19 1800  remdesivir 200 mg in sodium chloride 0.9% 250 mL IVPB       "Followed by" Linked Group Details   200 mg 580 mL/hr over 30 Minutes Intravenous Once 12/30/19 1608 12/30/19 1801          Medications  Scheduled Meds:  vitamin C  500 mg Oral Daily   dexamethasone (DECADRON) injection  6 mg Intravenous Daily   diltiazem  30 mg Oral Q6H   hydrALAZINE  50 mg Oral Q8H   Ipratropium-Albuterol  1 puff Inhalation Q6H   pantoprazole  40  mg Oral Daily   zinc sulfate  220 mg Oral Daily   Continuous Infusions:  sodium chloride     heparin 600 Units/hr (01/03/20 1646)   PRN Meds:.sodium chloride, acetaminophen, chlorpheniramine-HYDROcodone, guaiFENesin-dextromethorphan, metoprolol tartrate, ondansetron **OR** ondansetron (ZOFRAN) IV, oxyCODONE, zolpidem      Subjective:   Brittany Charles was seen and examined today.  Feeling better, no active vomiting, chest pain or shortness of breath.  No fevers.  Heart rate controlled, sinus rhythm.  No acute events overnight.    Objective:   Vitals:   01/02/20 1424 01/02/20 1930 01/03/20 0341 01/03/20 1506  BP: (!) 147/58 Marland Kitchen)  139/55 (!) 152/62 (!) 159/109  Pulse: (!) 52 (!) 53 (!) 53 67  Resp: 18 18 18 18   Temp: 98.2 F (36.8 C) 98.1 F (36.7 C) 98.1 F (36.7 C) 97.8 F (36.6 C)  TempSrc: Oral Oral Oral Oral  SpO2: 98% 100% 96% 100%  Weight:      Height:        Intake/Output Summary (Last 24 hours) at 01/03/2020 1731 Last data filed at 01/03/2020 1245 Gross per 24 hour  Intake 763.12 ml  Output 0 ml  Net 763.12 ml     Wt Readings from Last 3 Encounters:  12/30/19 90 kg  11/21/18 90.6 kg  09/23/18 91.6 kg     Exam  General: Alert and oriented x 3, NAD  Cardiovascular: S1 S2 auscultated, no murmurs, RRR  Respiratory: Clear to auscultation bilaterally, no wheezing, rales or rhonchi  Gastrointestinal: Soft, nontender, nondistended, + bowel sounds  Ext: no pedal edema bilaterally  Neuro: no new deficits  Musculoskeletal: No digital cyanosis, clubbing  Skin: No rashes  Psych: Normal affect and demeanor, alert and oriented x3    Data Reviewed:  I have personally reviewed following labs and imaging studies  Micro Results Recent Results (from the past 240 hour(s))  SARS Coronavirus 2 by RT PCR (hospital order, performed in [place] hospital lab) Nasopharyngeal Nasopharyngeal Swab     Status: Abnormal   Collection Time: 12/30/19 12:35 PM    Specimen: Nasopharyngeal Swab  Result Value Ref Range Status   SARS Coronavirus 2 POSITIVE (A) NEGATIVE Final    Comment: RESULT CALLED TO, READ BACK BY AND VERIFIED WITH: A.LEADWELL,RN [id number] @1437  BY V.WILKINS (NOTE) SARS-CoV-2 target nucleic acids are DETECTED  SARS-CoV-2 RNA is generally detectable in upper respiratory specimens  during the acute phase of infection.  Positive results are indicative  of the presence of the identified virus, but do not rule out bacterial infection or co-infection with other pathogens not detected by the test.  Clinical correlation with patient history and  other diagnostic information is necessary to determine patient infection status.  The expected result is negative.  Fact Sheet for Patients:   StrictlyIdeas.no   Fact Sheet for Healthcare Providers:   BankingDealers.co.za    This test is not yet approved or cleared by the [place] FDA and  has been authorized for detection and/or diagnosis of SARS-CoV-2 by FDA under an Emergency Use Authorization (EUA).  This EUA will remain in effect (meaning th is test can be used) for the duration of  the COVID-19 declaration under Section 564(b)(1) of the Act, 21 U.S.C. section 360-bbb-3(b)(1), unless the authorization is terminated or revoked sooner.  Performed at [facility], [place] [address] [place], [place] [address]     Radiology Reports US RENAL  Result Date: 12/31/2019 CLINICAL DATA:  Acute kidney injury EXAM: RENAL / URINARY TRACT ULTRASOUND COMPLETE COMPARISON:  Abdominal CT 01/11/2017 FINDINGS: Right Kidney: Renal measurements: 9 x 4.6 x 5.4 cm = volume: 115 mL. Echogenicity is mildly increased. No mass or hydronephrosis visualized. Left Kidney: Renal measurements: 9 x 5 x 5 cm = volume: 125 mL. Echogenicity is mildly increased. No solid mass or hydronephrosis visualized. Upper pole simple cyst measuring 2 cm Bladder: Appears  normal for degree of bladder distention. IMPRESSION: 1. Medical renal disease without asymmetry/atrophy. 2. Incidental in simple left upper pole cyst. Electronically Signed   By: Monte Fantasia M.D.   On: 12/31/2019 07:33   DG Chest Portable 1 View  Result  Date: 12/30/2019 CLINICAL DATA:  Cough, COVID-19 exposure EXAM: PORTABLE CHEST 1 VIEW COMPARISON:  04/17/2011 chest radiograph. FINDINGS: Stable cardiomediastinal silhouette with normal heart size. No pneumothorax. No pleural effusion. Faint patchy hazy opacities in the peripheral left mid lung and lower right lung. No pulmonary edema. IMPRESSION: Faint patchy hazy opacities in the peripheral left mid lung and lower right lung, cannot exclude atypical/viral pneumonia, although the differential includes mild scarring or atelectasis. Electronically Signed   By: Ilona Sorrel M.D.   On: 12/30/2019 13:45   ECHOCARDIOGRAM COMPLETE  Result Date: 01/03/2020    ECHOCARDIOGRAM REPORT   Patient Name:   Brittany Charles Date of Exam: 01/03/2020 Medical Rec #:  [id number]          Height:       65.0 in Accession #:    [id number]         Weight:       198.4 lb Date of Birth:  [birth year]          BSA:          1.971 m Patient Age:    89 years           BP:           152/62 mmHg Patient Gender: F                  HR:           67 bpm. Exam Location:  Inpatient Procedure: 2D Echo Indications:    atrial fibrillation  History:        Patient has prior history of Echocardiogram examinations, most                 recent 10/02/2012. CHF, Previous Myocardial Infarction; Risk                 Factors:Hypertension. Covid +.  Sonographer:    Jannett Celestine RDCS (AE) Referring Phys: [place]  1. Left ventricular ejection fraction, by estimation, is 65 to 70%. The left ventricle has normal function. The left ventricle has no regional wall motion abnormalities. Left ventricular diastolic parameters were normal.  2. Right ventricular systolic function is normal. The  right ventricular size is normal. Tricuspid regurgitation signal is inadequate for assessing PA pressure.  3. The mitral valve is grossly normal. No evidence of mitral valve regurgitation. No evidence of mitral stenosis.  4. The aortic valve is tricuspid. Aortic valve regurgitation is not visualized. Mild aortic valve sclerosis is present, with no evidence of aortic valve stenosis.  5. The inferior vena cava is normal in size with greater than 50% respiratory variability, suggesting right atrial pressure of 3 mmHg. FINDINGS  Left Ventricle: Left ventricular ejection fraction, by estimation, is 65 to 70%. The left ventricle has normal function. The left ventricle has no regional wall motion abnormalities. The left ventricular internal cavity size was normal in size. There is  no left ventricular hypertrophy. Left ventricular diastolic parameters were normal. Right Ventricle: The right ventricular size is normal. No increase in right ventricular wall thickness. Right ventricular systolic function is normal. Tricuspid regurgitation signal is inadequate for assessing PA pressure. Left Atrium: Left atrial size was normal in size. Right Atrium: Right atrial size was normal in size. Pericardium: Trivial pericardial effusion is present. Presence of pericardial fat pad. Mitral Valve: The mitral valve is grossly normal. No evidence of mitral valve regurgitation. No evidence of mitral valve stenosis. Tricuspid Valve: The tricuspid valve is  grossly normal. Tricuspid valve regurgitation is not demonstrated. No evidence of tricuspid stenosis. Aortic Valve: The aortic valve is tricuspid. Aortic valve regurgitation is not visualized. Mild aortic valve sclerosis is present, with no evidence of aortic valve stenosis. Pulmonic Valve: The pulmonic valve was grossly normal. Pulmonic valve regurgitation is not visualized. No evidence of pulmonic stenosis. Aorta: The aortic root is normal in size and structure. Venous: The inferior vena  cava is normal in size with greater than 50% respiratory variability, suggesting right atrial pressure of 3 mmHg. IAS/Shunts: The atrial septum is grossly normal. EKG: Rhythm strip during this exam demostrated normal sinus rhythm.  LEFT VENTRICLE PLAX 2D LVIDd:         4.00 cm  Diastology LVIDs:         2.40 cm  LV e' lateral:   7.18 cm/s LV PW:         0.90 cm  LV E/e' lateral: 11.3 LV IVS:        1.03 cm  LV e' medial:    9.03 cm/s LVOT diam:     1.90 cm  LV E/e' medial:  9.0 LV SV:         64 LV SV Index:   32 LVOT Area:     2.84 cm  LEFT ATRIUM             Index LA diam:        2.60 cm 1.32 cm/m LA Vol (A2C):   28.7 ml 14.56 ml/m LA Vol (A4C):   35.1 ml 17.80 ml/m LA Biplane Vol: 31.9 ml 16.18 ml/m  AORTIC VALVE LVOT Vmax:   115.00 cm/s LVOT Vmean:  87.900 cm/s LVOT VTI:    0.225 m  AORTA Ao Root diam: 2.70 cm MITRAL VALVE MV Area (PHT): 1.82 cm    SHUNTS MV Decel Time: 417 msec    Systemic VTI:  0.22 m MV E velocity: 81.00 cm/s  Systemic Diam: 1.90 cm Eleonore Chiquito MD Electronically signed by Eleonore Chiquito MD Signature Date/Time: 01/03/2020/11:12:53 AM    Final     Lab Data:  CBC: Recent Labs  Lab 12/30/19 1217 12/31/19 0523 01/01/20 1930 01/02/20 0425 01/03/20 0120  WBC 8.2 3.6* 9.6 8.8 6.9  NEUTROABS 5.3 2.7 8.1* 7.1 6.0  HGB 11.4* 12.0 12.7 11.2* 11.3*  HCT 35.6* 38.7 40.2 35.4* 35.3*  MCV 86.2 86.4 85.0 85.3 85.9  PLT 383 385 488* 450* 034*   Basic Metabolic Panel: Recent Labs  Lab 12/30/19 1217 12/31/19 0523 01/01/20 1930 01/02/20 0425 01/03/20 0120  NA 139 140 143 144 140  K 3.7 4.3 3.4* 3.1* 3.8  CL 103 113* 104 107 106  CO2 18* 14* 22 25 22   GLUCOSE 111* 125* 136* 115* 166*  BUN 98* 77* 56* 56* 59*  CREATININE 5.77* 4.17* 3.09* 3.05* 3.03*  CALCIUM 8.7* 8.7* 9.1 8.6* 8.4*  MG  --  2.5* 1.8 1.8 1.7  PHOS  --  5.4* 3.5 4.5 3.4   GFR: Estimated Creatinine Clearance: 19.7 mL/min (A) (by C-G formula based on SCr of 3.03 mg/dL (H)). Liver Function Tests: Recent  Labs  Lab 12/30/19 1217 12/31/19 0523 01/01/20 1930 01/02/20 0425 01/03/20 0120  AST 19 18 18 15 27   ALT 14 13 13 11 16   ALKPHOS 90 96 84 75 77  BILITOT 0.7 0.4 0.5 0.6 0.5  PROT 8.2* 8.4* 7.4 6.7 6.7  ALBUMIN 3.3* 3.1* 3.2* 2.7* 2.7*   Recent Labs  Lab 12/30/19 1217  LIPASE  34   No results for input(s): AMMONIA in the last 168 hours. Coagulation Profile: Recent Labs  Lab 01/02/20 1551  INR 1.1   Cardiac Enzymes: No results for input(s): CKTOTAL, CKMB, CKMBINDEX, TROPONINI in the last 168 hours. BNP (last 3 results) No results for input(s): PROBNP in the last 8760 hours. HbA1C: No results for input(s): HGBA1C in the last 72 hours. CBG: No results for input(s): GLUCAP in the last 168 hours. Lipid Profile: No results for input(s): CHOL, HDL, LDLCALC, TRIG, CHOLHDL, LDLDIRECT in the last 72 hours. Thyroid Function Tests: No results for input(s): TSH, T4TOTAL, FREET4, T3FREE, THYROIDAB in the last 72 hours. Anemia Panel: Recent Labs    01/02/20 0425 01/03/20 0120  FERRITIN 220 230   Urine analysis:    Component Value Date/Time   COLORURINE YELLOW 12/30/2019 1217   APPEARANCEUR CLEAR 12/30/2019 1217   LABSPEC 1.011 12/30/2019 1217   PHURINE 5.0 12/30/2019 1217   GLUCOSEU NEGATIVE 12/30/2019 1217   HGBUR SMALL (A) 12/30/2019 1217   BILIRUBINUR NEGATIVE 12/30/2019 1217   BILIRUBINUR neg 09/12/2014 1612   KETONESUR NEGATIVE 12/30/2019 1217   PROTEINUR 100 (A) 12/30/2019 1217   UROBILINOGEN 0.2 09/12/2014 1612   UROBILINOGEN 0.2 10/01/2012 1845   NITRITE NEGATIVE 12/30/2019 1217   LEUKOCYTESUR LARGE (A) 12/30/2019 1217     Berdella Bacot M.D. [facility] 01/03/2020, 5:31 PM   Call night coverage person covering after 7pm

## 2020-01-03 NOTE — Progress Notes (Signed)
Telemetry reviewed, appears to be maintaining sinus rhythm.  If having recurrent AF, would recommend starting amiodarone to maintain sinus rhythm.  Continue anticoagulation.  Echo reviewed, shows normal systolic function, OK to continue diltiazem for rate control.  Donato Heinz, MD

## 2020-01-03 NOTE — Progress Notes (Signed)
ANTICOAGULATION CONSULT NOTE -  Consult  Pharmacy Consult for Heparin Indication: atrial fibrillation  Allergies  Allergen Reactions  . Zithromax [Azithromycin] Nausea And Vomiting    Patient Measurements: Height: 5\' 5"  (165.1 cm) Weight: 90 kg (198 lb 6.6 oz) IBW/kg (Calculated) : 57 Heparin Dosing Weight: 77 kg  Vital Signs: Temp: 98.1 F (36.7 C) (08/24 1930) Temp Source: Oral (08/24 1930) BP: 139/55 (08/24 1930) Pulse Rate: 53 (08/24 1930)  Labs: Recent Labs    01/01/20 1930 01/01/20 1930 01/02/20 0425 01/02/20 1551 01/03/20 0120  HGB 12.7   < > 11.2*  --  11.3*  HCT 40.2  --  35.4*  --  35.3*  PLT 488*  --  450*  --  424*  APTT  --   --   --  36  --   LABPROT  --   --   --  14.1  --   INR  --   --   --  1.1  --   HEPARINUNFRC  --   --   --   --  0.98*  CREATININE 3.09*  --  3.05*  --  3.03*   < > = values in this interval not displayed.    Estimated Creatinine Clearance: 19.7 mL/min (A) (by C-G formula based on SCr of 3.03 mg/dL (H)).   Medical History: Past Medical History:  Diagnosis Date  . Arthritis   . CHF (congestive heart failure) ([place])   . Fibroids   . Hypertension   . Lupus (systemic lupus erythematosus) ([place])   . Myocardial infarction ([place])   . Renal insufficiency     Medications:  Scheduled:  . vitamin C  500 mg Oral Daily  . calcium acetate  667 mg Oral TID WC  . dexamethasone (DECADRON) injection  6 mg Intravenous Daily  . diltiazem  30 mg Oral Q6H  . hydrALAZINE  50 mg Oral Q8H  . Ipratropium-Albuterol  1 puff Inhalation Q6H  . pantoprazole  40 mg Oral Daily  . zinc sulfate  220 mg Oral Daily   Infusions:  . sodium chloride    . heparin 1,100 Units/hr (01/02/20 1800)  . remdesivir 100 mg in NS 100 mL Stopped (01/02/20 1119)    Assessment: 64 yoF admitted on 12/30/19 with COVID-19, [name]  CHA2DS2VASc 4.  Pharmacy is consulted for IV heparin. No prior to admission  anticoagulants. Inpatient anticoagulation with Heparin [place] 5000 units q8h for VTE prophylaxis.  Most recent dose on 8/24 at 13:37 RN reports patient is a "hard stick" and has poor IV access. Hx AoCKD 4 (baseline SCr 3- 3.5), currently SCr 3.05 Hgb 11.2, Plt 450  Today, 01/03/20  HL 0.98, supratherapeutic  Hgb 11.3 low but stable  Plt 424  SCr 3.05 mg/dl   No line or bleeding issues per RN   Goal of Therapy:  Heparin level 0.3-0.7 units/ml Monitor platelets by anticoagulation protocol: Yes   Plan:   Decrease heparin IV infusion to 900 units/hr  Heparin level 8 hours after rate change   Daily heparin level and CBC  Continue to monitor H&H and platelets  Monitor for signs and symptoms of bleeding     Brittany Charles, PharmD, BCPS 01/03/2020 2:36 AM

## 2020-01-03 NOTE — Progress Notes (Signed)
ANTICOAGULATION CONSULT NOTE -  Consult  Pharmacy Consult for Heparin Indication: atrial fibrillation  Allergies  Allergen Reactions  . Zithromax [Azithromycin] Nausea And Vomiting    Patient Measurements: Height: 5\' 5"  (165.1 cm) Weight: 90 kg (198 lb 6.6 oz) IBW/kg (Calculated) : 57 Heparin Dosing Weight: 77 kg  Vital Signs: Temp: 97.8 F (36.6 C) (08/25 1506) Temp Source: Oral (08/25 1506) BP: 159/109 (08/25 1506) Pulse Rate: 67 (08/25 1506)  Labs: Recent Labs    01/01/20 1930 01/01/20 1930 01/02/20 0425 01/02/20 1551 01/03/20 0120 01/03/20 1302  HGB 12.7   < > 11.2*  --  11.3*  --   HCT 40.2  --  35.4*  --  35.3*  --   PLT 488*  --  450*  --  424*  --   APTT  --   --   --  36  --   --   LABPROT  --   --   --  14.1  --   --   INR  --   --   --  1.1  --   --   HEPARINUNFRC  --   --   --   --  0.98* 0.88*  CREATININE 3.09*  --  3.05*  --  3.03*  --    < > = values in this interval not displayed.    Estimated Creatinine Clearance: 19.7 mL/min (A) (by C-G formula based on SCr of 3.03 mg/dL (H)).   Medical History: Past Medical History:  Diagnosis Date  . Arthritis   . CHF (congestive heart failure) ([place])   . Fibroids   . Hypertension   . Lupus (systemic lupus erythematosus) ([place])   . Myocardial infarction ([place])   . Renal insufficiency     Medications:  Scheduled:  . vitamin C  500 mg Oral Daily  . dexamethasone (DECADRON) injection  6 mg Intravenous Daily  . diltiazem  30 mg Oral Q6H  . hydrALAZINE  50 mg Oral Q8H  . Ipratropium-Albuterol  1 puff Inhalation Q6H  . pantoprazole  40 mg Oral Daily  . zinc sulfate  220 mg Oral Daily   Infusions:  . sodium chloride    . heparin 900 Units/hr (01/03/20 0315)    Assessment: 51 yoF admitted on 12/30/19 with COVID-19, [name]  CHA2DS2VASc 4.  Pharmacy is consulted for IV heparin. No prior to admission anticoagulants. Inpatient anticoagulation with Heparin [place] 5000 units  q8h for VTE prophylaxis.  Most recent dose on 8/24 at 13:37 RN reports patient is a "hard stick" and has poor IV access. Hx AoCKD 4 (baseline SCr 3- 3.5), currently SCr 3.05 Hgb 11.2, Plt 450  Today, 01/03/20  HL 0.88, remains supratherapeutic  Hgb 11.3 low but stable  Plt 424  SCr 3.05 mg/dl   No line or bleeding issues per RN   Goal of Therapy:  Heparin level 0.3-0.7 units/ml Monitor platelets by anticoagulation protocol: Yes   Plan:   Decrease heparin IV infusion to 600 units/hr  Heparin level 8 hours after rate change   Daily heparin level and CBC  Continue to monitor H&H and platelets  Monitor for signs and symptoms of bleeding     Ulice Dash, PharmD, BCPS [phone] 01/03/2020 3:17 PM

## 2020-01-04 ENCOUNTER — Telehealth: Payer: Self-pay | Admitting: *Deleted

## 2020-01-04 ENCOUNTER — Other Ambulatory Visit: Payer: Self-pay | Admitting: Student

## 2020-01-04 DIAGNOSIS — I480 Paroxysmal atrial fibrillation: Secondary | ICD-10-CM

## 2020-01-04 LAB — HEPARIN LEVEL (UNFRACTIONATED)
Heparin Unfractionated: 0.33 IU/mL (ref 0.30–0.70)
Heparin Unfractionated: 0.12 IU/mL — ABNORMAL LOW (ref 0.30–0.70)

## 2020-01-04 LAB — MAGNESIUM: Magnesium: 1.7 mg/dL (ref 1.7–2.4)

## 2020-01-04 LAB — CBC WITH DIFFERENTIAL/PLATELET
Abs Immature Granulocytes: 0.17 10*3/uL — ABNORMAL HIGH (ref 0.00–0.07)
Basophils Absolute: 0 10*3/uL (ref 0.0–0.1)
Basophils Relative: 0 %
Eosinophils Absolute: 0 10*3/uL (ref 0.0–0.5)
Eosinophils Relative: 0 %
HCT: 37.1 % (ref 36.0–46.0)
Hemoglobin: 11.5 g/dL — ABNORMAL LOW (ref 12.0–15.0)
Immature Granulocytes: 2 %
Lymphocytes Relative: 11 %
Lymphs Abs: 0.9 10*3/uL (ref 0.7–4.0)
MCH: 27.3 pg (ref 26.0–34.0)
MCHC: 31 g/dL (ref 30.0–36.0)
MCV: 87.9 fL (ref 80.0–100.0)
Monocytes Absolute: 0.3 10*3/uL (ref 0.1–1.0)
Monocytes Relative: 3 %
Neutro Abs: 7.4 10*3/uL (ref 1.7–7.7)
Neutrophils Relative %: 84 %
Platelets: 406 10*3/uL — ABNORMAL HIGH (ref 150–400)
RBC: 4.22 MIL/uL (ref 3.87–5.11)
RDW: 14.9 % (ref 11.5–15.5)
WBC: 8.8 10*3/uL (ref 4.0–10.5)
nRBC: 0 % (ref 0.0–0.2)

## 2020-01-04 LAB — C-REACTIVE PROTEIN: CRP: 0.7 mg/dL (ref <1.0)

## 2020-01-04 LAB — COMPREHENSIVE METABOLIC PANEL
ALT: 19 U/L (ref 0–44)
AST: 27 U/L (ref 15–41)
Albumin: 2.7 g/dL — ABNORMAL LOW (ref 3.5–5.0)
Alkaline Phosphatase: 76 U/L (ref 38–126)
Anion gap: 11 (ref 5–15)
BUN: 53 mg/dL — ABNORMAL HIGH (ref 8–23)
CO2: 20 mmol/L — ABNORMAL LOW (ref 22–32)
Calcium: 8.5 mg/dL — ABNORMAL LOW (ref 8.9–10.3)
Chloride: 107 mmol/L (ref 98–111)
Creatinine, Ser: 2.92 mg/dL — ABNORMAL HIGH (ref 0.44–1.00)
GFR calc Af Amer: 18 mL/min — ABNORMAL LOW (ref >60)
GFR calc non Af Amer: 16 mL/min — ABNORMAL LOW (ref >60)
Glucose, Bld: 162 mg/dL — ABNORMAL HIGH (ref 70–99)
Potassium: 3.9 mmol/L (ref 3.5–5.1)
Sodium: 138 mmol/L (ref 135–145)
Total Bilirubin: 0.2 mg/dL — ABNORMAL LOW (ref 0.3–1.2)
Total Protein: 6.4 g/dL — ABNORMAL LOW (ref 6.5–8.1)

## 2020-01-04 LAB — D-DIMER, QUANTITATIVE: D-Dimer, Quant: 1.19 ug/mL-FEU — ABNORMAL HIGH (ref 0.00–0.50)

## 2020-01-04 LAB — FERRITIN: Ferritin: 300 ng/mL (ref 11–307)

## 2020-01-04 LAB — PHOSPHORUS: Phosphorus: 3.5 mg/dL (ref 2.5–4.6)

## 2020-01-04 MED ORDER — ALBUTEROL SULFATE HFA 108 (90 BASE) MCG/ACT IN AERS: 2.0000 | INHALATION_SPRAY | Freq: Four times a day (QID) | RESPIRATORY_TRACT | 2 refills | Status: AC | PRN

## 2020-01-04 MED ORDER — ZINC SULFATE 220 (50 ZN) MG PO CAPS: 220.0000 mg | ORAL_CAPSULE | Freq: Every day | ORAL | 0 refills | Status: DC

## 2020-01-04 MED ORDER — FUROSEMIDE 80 MG PO TABS: 80.0000 mg | ORAL_TABLET | Freq: Every day | ORAL | Status: DC

## 2020-01-04 MED ORDER — DILTIAZEM HCL ER COATED BEADS 180 MG PO CP24: 180.0000 mg | ORAL_CAPSULE | Freq: Every day | ORAL | 3 refills | Status: DC

## 2020-01-04 MED ORDER — APIXABAN 5 MG PO TABS
5.0000 mg | ORAL_TABLET | Freq: Two times a day (BID) | ORAL | Status: DC
  Administered 2020-01-04: 5 mg via ORAL
  Filled 2020-01-04: qty 1

## 2020-01-04 MED ORDER — PANTOPRAZOLE SODIUM 40 MG PO TBEC
40.0000 mg | DELAYED_RELEASE_TABLET | Freq: Every day | ORAL | 0 refills | Status: DC
Start: 2020-01-05 — End: 2020-03-12

## 2020-01-04 MED ORDER — DILTIAZEM HCL ER COATED BEADS 180 MG PO CP24: 180.0000 mg | ORAL_CAPSULE | Freq: Every day | ORAL | Status: DC

## 2020-01-04 MED ORDER — GUAIFENESIN-DM 100-10 MG/5ML PO SYRP: 10.0000 mL | ORAL_SOLUTION | ORAL | 0 refills | Status: DC | PRN

## 2020-01-04 MED ORDER — ASCORBIC ACID 500 MG PO TABS
500.0000 mg | ORAL_TABLET | Freq: Every day | ORAL | 0 refills | Status: DC
Start: 2020-01-05 — End: 2022-03-23

## 2020-01-04 MED ORDER — APIXABAN 5 MG PO TABS
5.0000 mg | ORAL_TABLET | Freq: Two times a day (BID) | ORAL | 2 refills | Status: DC
Start: 2020-01-04 — End: 2020-04-12

## 2020-01-04 MED ORDER — DEXAMETHASONE 6 MG PO TABS: 6.0000 mg | ORAL_TABLET | Freq: Every day | ORAL | 0 refills | Status: AC

## 2020-01-04 NOTE — Progress Notes (Signed)
Ordered 7-day Zio monitor to evaluate rate control of atrial fibrillation per Dr. Gardiner Rhyme. Please see rounding note from today for more details.

## 2020-01-04 NOTE — Telephone Encounter (Signed)
Line busy on multiple attempts.  Attempted to contact patient to inform 7 day ZIO XT long term holter monitor to be shipped to her home. [employer] will send patient text message.

## 2020-01-04 NOTE — Progress Notes (Signed)
Telemetry reviewed, appears to be maintaining sinus rhythm.  For anticoagulation, will switch from heparin gtt to Eliquis.  HR has been low, would decrease diltiazem dose to 180 mg daily.  Will discharge with monitor to evaluate rate control.  [facility] will sign off.   Medication Recommendations:  Diltiazem CD 180 mg daily, Eliquis 5 mg BID Other recommendations (labs, testing, etc):  Zio patch x 7 days Follow up as an outpatient:  Will schedule outpatient f/u for 1 month  Donato Heinz, MD

## 2020-01-04 NOTE — Discharge Summary (Addendum)
Physician Discharge Summary   Patient ID: Brittany Charles MRN: [id number] DOB/AGE: December 29, 1950 69 y.o.  Admit date: 12/30/2019 Discharge date: 01/04/2020  Primary Care Physician:  Wendie Agreste, MD   Recommendations for Outpatient Follow-up:  1. Follow up with PCP in 1-2 weeks 2. Please obtain BMP in one week  3. Cardizem dose changed to Cardizem 180 mg daily 4. Started on Eliquis 5 mg twice daily 5. Cardiology will arrange outpatient follow-up, Zio patch for 7 days  Home Health: Ambulating at baseline Equipment/Devices:   Discharge Condition: stable  CODE STATUS: FULL  Diet recommendation: Heart healthy diet   Discharge Diagnoses:    . AKI (acute kidney injury) ([place]) on CKD stage IV Anion gap metabolic acidosis, present on admission New onset atrial fibrillation  COVID-19 pneumonia Chronic diastolic CHF Diarrhea resolved Generalized weakness, debility    Consults: Cardiology    Allergies:   Allergies  Allergen Reactions  . Zithromax [Azithromycin] Nausea And Vomiting     DISCHARGE MEDICATIONS: Allergies as of 01/04/2020      Reactions   Zithromax [azithromycin] Nausea And Vomiting      Medication List    TAKE these medications   acetaminophen 325 MG tablet Commonly known as: TYLENOL Take 650 mg by mouth as needed for mild pain or headache.   albuterol 108 (90 Base) MCG/ACT inhaler Commonly known as: VENTOLIN HFA Inhale 2 puffs into the lungs every 6 (six) hours as needed for wheezing or shortness of breath.   allopurinol 100 MG tablet Commonly known as: ZYLOPRIM TAKE 1 TABLET EVERY DAY   apixaban 5 MG Tabs tablet Commonly known as: ELIQUIS Take 1 tablet (5 mg total) by mouth 2 (two) times daily.   ascorbic acid 500 MG tablet Commonly known as: VITAMIN C Take 1 tablet (500 mg total) by mouth daily. Start taking on: January 05, 2020   calcitRIOL 0.25 MCG capsule Commonly known as: ROCALTROL Take 0.125 mcg by mouth daily.   Colcrys  0.6 MG tablet Generic drug: colchicine Take 0.6 mg by mouth as needed (gout flareups).   dexamethasone 6 MG tablet Commonly known as: DECADRON Take 1 tablet (6 mg total) by mouth daily for 4 days. Start taking on: January 05, 2020   diltiazem 180 MG 24 hr capsule Commonly known as: CARDIZEM CD Take 1 capsule (180 mg total) by mouth daily. What changed:   medication strength  how much to take   furosemide 80 MG tablet Commonly known as: LASIX Take 1 tablet (80 mg total) by mouth daily. HOLD for 3 days, check your weight daily, resume per instructions. Start taking on: January 08, 2020 What changed:   additional instructions  These instructions start on January 08, 2020. If you are unsure what to do until then, ask your doctor or other care provider.   guaiFENesin-dextromethorphan 100-10 MG/5ML syrup Commonly known as: ROBITUSSIN DM Take 10 mLs by mouth every 4 (four) hours as needed for cough.   hydrALAZINE 50 MG tablet Commonly known as: APRESOLINE TAKE 1 TABLET THREE TIMES DAILY   pantoprazole 40 MG tablet Commonly known as: PROTONIX Take 1 tablet (40 mg total) by mouth daily. Start taking on: January 05, 2020   zinc sulfate 220 (50 Zn) MG capsule Take 1 capsule (220 mg total) by mouth daily. Start taking on: January 05, 2020        Brief H and P: For complete details please refer to admission H and P, but in brief Ura B Mitchellis a 69  y.o.femalewith history ofCKD-4, CHF unknown type, CAD, HTN, lupus, goutandarthritis presented with generalized weakness, productive cough and diarrhea. Patient reports cough for the last 2 weeks. She developed watery diarrhea earlier this morning. Also felt very weak and lightheaded that prompted her to come to ED. denied any fever chills hemoptysis, chest pain, shortness of breath.  No nausea or vomiting. she had multiple family members who tested positive for COVID-19 recently. She is fully vaccinated against COVID-19.She  had both shotsof Pfizerin February 2021. She denied headache, vision change, myalgia, loss of taste or smell, UTI symptoms or focal neuro symptoms. She has not noticed changes to urine output. She had not taken her Lasix in the last 2 to 3 days. She denies NSAID use.Patient is followed by Dr. Johnney Ou from [facility]. In ED, COVID-19 PCR positive. Cr5.77 (2.89in7/2020).BUN 98.K3.7. Bicarb 18. Anion gap 18. Hgb 11.4. Lipase was normal. Otherwise,CMP and CBC without significant finding. Chest x-ray concerning for slight bilateral infiltrate.Patient was started on Decadron and IV fluids. Patient was admitted for AKI on CKD stage IV with metabolic acidosis. Nephrology consulted,recommended sodium bicarb infusion,metabolic acidosis resolved, Patient developed atrial fibrillation with RVRwith tachy brady,cardiology consulted,started on amiodarone and heparin drip given high CHADS2 score.  Hospital Course:   Acute kidney injury on CKD-4 with azotemia -Currently improving, likely prerenal etiology in the setting of poor p.o. intake, diuretics and diarrhea.  Some degree of ATN from COVID-19. - Patient is followed by Dr. [facility]ney [facility]u at [facility].   Cron admission 5.77 (2.89 in 11/2018).BUN 98. GI symptoms could be due to Covid or uremia. -Patient was placed on IV fluid hydration.  Renal ultrasound did not show any obstruction or hydronephrosis.  Lasix was placed on hold -Nephrology was consulted.  Patient seen by Dr. Joylene Grapes recommended bicarb infusion. -Creatinine has improved to 2.9, at her baseline -Discussed with cardiology, Dr. Gardiner Rhyme, recommended to hold off on Lasix for couple more days.  Patient was given instructions to check her weight daily and resume per instructions.  She will also follow-up with Dr. Johnney Ou, check BMET outpatient   Anion gap metabolic acidosis: Present on admission -Likely due to azotemia/renal failure. -Bicarb  infusion has been discontinued.  Currently stable  Paroxysmal atrial fibrillation with tachybradycardia:, New onset -Cardiology was consulted, patient was placed on IV amiodarone and heparin drip  -Heart rate now controlled, in 60s, sinus rhythm.   -2D echo showed EF of 65 to 70%, no regional wall motion abnormalities -Amiodarone drip off, patient was placed on long-acting Cardizem 180 mg daily (previously on Cardizem 300 mg daily however held lower heart rates in 50-60s).  -CHA2DS2VASc 4,Transition to NOACs (eliquis) per cardiology recommendation, heparin drip off, discussed with the patient  COVID-19 pneumonia- symptomatic for about 2 weeks. -Chest x-ray showed bilateral infiltrates, elevated inflammatory markers. -Patient reported completed 2 series of Pfizer vaccine in 06/2019 -Patient has completed 5 doses of remdesivir.  Continue oral Decadron for 4 more days to complete full treatment. -Continue albuterol inhaler as needed, Robitussin.   Chronic diastolic CHF  -2D echo showed EF of 65 to 70%, cardiology was consulted.  Currently euvolemic. Patient instructed to hold off on Lasix for couple more days.  Diarrhea: Could be due to COVID-19 or uremia. Abdominal exam is benign. -Resolved  Dysphagia with solids -Currently on heart healthy diet, tolerating without any difficulty  Generalized weakness/lightheadedness-likely due to dehydration, renal failure and COVID-19. -Currently stable, PT evaluation done on 8/23, recommended no PT follow-up   Essential hypertension:  -  BP currently stable  Anemia of renal disease/CKD:  -H&H currently stable   History of diabetes?-Does not take medications.  -Diet controlled, hemoglobin A1c 6.5  History of lupus? Does not seem to be onmedication.      Day of Discharge S: No acute complaints, in sinus rhythm, hoping to go home today.  Completed remdesivir.  BP (!) 155/70 (BP Location: Right Arm)   Pulse (!) 51    Temp 98 F (36.7 C)   Resp 18   Ht 5\' 5"  (1.651 m)   Wt 90 kg   SpO2 100%   BMI 33.02 kg/m   Physical Exam: General: Alert and awake oriented x3 not in any acute distress. HEENT: anicteric sclera, pupils reactive to light and accommodation CVS: S1-S2 clear no murmur rubs or gallops Chest: clear to auscultation bilaterally, no wheezing rales or rhonchi Abdomen: soft nontender, nondistended, normal bowel sounds Extremities: no cyanosis, clubbing or edema noted bilaterally Neuro: Cranial nerves II-XII intact, no focal neurological deficits    Get Medicines reviewed and adjusted: Please take all your medications with you for your next visit with your Primary MD  Please request your Primary MD to go over all hospital tests and procedure/radiological results at the follow up. Please ask your Primary MD to get all Hospital records sent to his/her office.  If you experience worsening of your admission symptoms, develop shortness of breath, life threatening emergency, suicidal or homicidal thoughts you must seek medical attention immediately by calling 911 or calling your MD immediately  if symptoms less severe.  You must read complete instructions/literature along with all the possible adverse reactions/side effects for all the Medicines you take and that have been prescribed to you. Take any new Medicines after you have completely understood and accept all the possible adverse reactions/side effects.   Do not drive when taking pain medications.   Do not take more than prescribed Pain, Sleep and Anxiety Medications  Special Instructions: If you have smoked or chewed Tobacco  in the last 2 yrs please stop smoking, stop any regular Alcohol  and or any Recreational drug use.  Wear Seat belts while driving.  Please note  You were cared for by a hospitalist during your hospital stay. Once you are discharged, your primary care physician will handle any further medical issues. Please note  that NO REFILLS for any discharge medications will be authorized once you are discharged, as it is imperative that you return to your primary care physician (or establish a relationship with a primary care physician if you do not have one) for your aftercare needs so that they can reassess your need for medications and monitor your lab values.   The results of significant diagnostics from this hospitalization (including imaging, microbiology, ancillary and laboratory) are listed below for reference.      Procedures/Studies:  US RENAL  Result Date: 2020/01/28 CLINICAL DATA:  Acute kidney injury EXAM: RENAL / URINARY TRACT ULTRASOUND COMPLETE COMPARISON:  Abdominal CT 01/11/2017 FINDINGS: Right Kidney: Renal measurements: 9 x 4.6 x 5.4 cm = volume: 115 mL. Echogenicity is mildly increased. No mass or hydronephrosis visualized. Left Kidney: Renal measurements: 9 x 5 x 5 cm = volume: 125 mL. Echogenicity is mildly increased. No solid mass or hydronephrosis visualized. Upper pole simple cyst measuring 2 cm Bladder: Appears normal for degree of bladder distention. IMPRESSION: 1. Medical renal disease without asymmetry/atrophy. 2. Incidental in simple left upper pole cyst. Electronically Signed   By: [name].D.  On: 12/31/2019 07:33   DG Chest Portable 1 View  Result Date: 12/30/2019 CLINICAL DATA:  Cough, COVID-19 exposure EXAM: PORTABLE CHEST 1 VIEW COMPARISON:  04/17/2011 chest radiograph. FINDINGS: Stable cardiomediastinal silhouette with normal heart size. No pneumothorax. No pleural effusion. Faint patchy hazy opacities in the peripheral left mid lung and lower right lung. No pulmonary edema. IMPRESSION: Faint patchy hazy opacities in the peripheral left mid lung and lower right lung, cannot exclude atypical/viral pneumonia, although the differential includes mild scarring or atelectasis. Electronically Signed   By: Ilona Sorrel M.D.   On: 12/30/2019 13:45   ECHOCARDIOGRAM  COMPLETE  Result Date: 01/03/2020    ECHOCARDIOGRAM REPORT   Patient Name:   SAKOYA WIN Date of Exam: 01/03/2020 Medical Rec #:  [id number]          Height:       65.0 in Accession #:    [id number]         Weight:       198.4 lb Date of Birth:  14-Jul-1950          BSA:          1.971 m Patient Age:    48 years           BP:           152/62 mmHg Patient Gender: F                  HR:           67 bpm. Exam Location:  Inpatient Procedure: 2D Echo Indications:    atrial fibrillation  History:        Patient has prior history of Echocardiogram examinations, most                 recent 10/02/2012. CHF, Previous Myocardial Infarction; Risk                 Factors:Hypertension. Covid +.  Sonographer:    Jannett Celestine RDCS (AE) Referring Phys: [place]  1. Left ventricular ejection fraction, by estimation, is 65 to 70%. The left ventricle has normal function. The left ventricle has no regional wall motion abnormalities. Left ventricular diastolic parameters were normal.  2. Right ventricular systolic function is normal. The right ventricular size is normal. Tricuspid regurgitation signal is inadequate for assessing PA pressure.  3. The mitral valve is grossly normal. No evidence of mitral valve regurgitation. No evidence of mitral stenosis.  4. The aortic valve is tricuspid. Aortic valve regurgitation is not visualized. Mild aortic valve sclerosis is present, with no evidence of aortic valve stenosis.  5. The inferior vena cava is normal in size with greater than 50% respiratory variability, suggesting right atrial pressure of 3 mmHg. FINDINGS  Left Ventricle: Left ventricular ejection fraction, by estimation, is 65 to 70%. The left ventricle has normal function. The left ventricle has no regional wall motion abnormalities. The left ventricular internal cavity size was normal in size. There is  no left ventricular hypertrophy. Left ventricular diastolic parameters were normal. Right Ventricle:  The right ventricular size is normal. No increase in right ventricular wall thickness. Right ventricular systolic function is normal. Tricuspid regurgitation signal is inadequate for assessing PA pressure. Left Atrium: Left atrial size was normal in size. Right Atrium: Right atrial size was normal in size. Pericardium: Trivial pericardial effusion is present. Presence of pericardial fat pad. Mitral Valve: The mitral valve is grossly normal. No evidence of mitral valve  regurgitation. No evidence of mitral valve stenosis. Tricuspid Valve: The tricuspid valve is grossly normal. Tricuspid valve regurgitation is not demonstrated. No evidence of tricuspid stenosis. Aortic Valve: The aortic valve is tricuspid. Aortic valve regurgitation is not visualized. Mild aortic valve sclerosis is present, with no evidence of aortic valve stenosis. Pulmonic Valve: The pulmonic valve was grossly normal. Pulmonic valve regurgitation is not visualized. No evidence of pulmonic stenosis. Aorta: The aortic root is normal in size and structure. Venous: The inferior vena cava is normal in size with greater than 50% respiratory variability, suggesting right atrial pressure of 3 mmHg. IAS/Shunts: The atrial septum is grossly normal. EKG: Rhythm strip during this exam demostrated normal sinus rhythm.  LEFT VENTRICLE PLAX 2D LVIDd:         4.00 cm  Diastology LVIDs:         2.40 cm  LV e' lateral:   7.18 cm/s LV PW:         0.90 cm  LV E/e' lateral: 11.3 LV IVS:        1.03 cm  LV e' medial:    9.03 cm/s LVOT diam:     1.90 cm  LV E/e' medial:  9.0 LV SV:         64 LV SV Index:   32 LVOT Area:     2.84 cm  LEFT ATRIUM             Index LA diam:        2.60 cm 1.32 cm/m LA Vol (A2C):   28.7 ml 14.56 ml/m LA Vol (A4C):   35.1 ml 17.80 ml/m LA Biplane Vol: 31.9 ml 16.18 ml/m  AORTIC VALVE LVOT Vmax:   115.00 cm/s LVOT Vmean:  87.900 cm/s LVOT VTI:    0.225 m  AORTA Ao Root diam: 2.70 cm MITRAL VALVE MV Area (PHT): 1.82 cm    SHUNTS MV Decel  Time: 417 msec    Systemic VTI:  0.22 m MV E velocity: 81.00 cm/s  Systemic Diam: 1.90 cm Eleonore Chiquito MD Electronically signed by Eleonore Chiquito MD Signature Date/Time: 01/03/2020/11:12:53 AM    Final        LAB RESULTS: Basic Metabolic Panel: Recent Labs  Lab 01/03/20 0120 01/03/20 0120 01/04/20 0027  NA 140  --  138  K 3.8  --  3.9  CL 106  --  107  CO2 22  --  20*  GLUCOSE 166*  --  162*  BUN 59*  --  53*  CREATININE 3.03*  --  2.92*  CALCIUM 8.4*  --  8.5*  MG 1.7   < > 1.7  PHOS 3.4   < > 3.5   < > = values in this interval not displayed.   Liver Function Tests: Recent Labs  Lab 01/03/20 0120 01/04/20 0027  AST 27 27  ALT 16 19  ALKPHOS 77 76  BILITOT 0.5 0.2*  PROT 6.7 6.4*  ALBUMIN 2.7* 2.7*   Recent Labs  Lab 12/30/19 1217  LIPASE 34   No results for input(s): AMMONIA in the last 168 hours. CBC: Recent Labs  Lab 01/03/20 0120 01/03/20 0120 01/04/20 0027  WBC 6.9  --  8.8  NEUTROABS 6.0   < > 7.4  HGB 11.3*  --  11.5*  HCT 35.3*  --  37.1  MCV 85.9   < > 87.9  PLT 424*  --  406*   < > = values in this interval not displayed.  Cardiac Enzymes: No results for input(s): CKTOTAL, CKMB, CKMBINDEX, TROPONINI in the last 168 hours. BNP: Invalid input(s): POCBNP CBG: No results for input(s): GLUCAP in the last 168 hours.     Disposition and Follow-up: Discharge Instructions    (HEART FAILURE PATIENTS) Call MD:  Anytime you have any of the following symptoms: 1) 3 pound weight gain in 24 hours or 5 pounds in 1 week 2) shortness of breath, with or without a dry hacking cough 3) swelling in the hands, feet or stomach 4) if you have to sleep on extra pillows at night in order to breathe.   Complete by: As directed    Diet - low sodium heart healthy   Complete by: As directed    Increase activity slowly   Complete by: As directed        DISPOSITION: Home   DISCHARGE FOLLOW-UP  Follow-up Information    Tommie Raymond, NP Follow up.    Specialty: Cardiology Why: Hospital follow-up scheduled for 02/01/2020 at 8:15am with Kathyrn Drown, one of our office's NPs If this date/time does not work for you, please call our office to reschedule. Our office will call you to help arrange heart monitor. Contact information: [address] [place] [address] [phone]        Wendie Agreste, MD Follow up in 2 week(s).   Specialties: Family Medicine, Sports Medicine Contact information: [place] [address] [phone]        Jerline Pain, MD .   Specialty: Cardiology Contact information: [address] [place] [address] [phone]        Justin Mend, MD. Schedule an appointment as soon as possible for a visit in 2 week(s).   Specialty: Internal Medicine Contact information: [place] [address] [phone]                Time coordinating discharge:  38mins   Signed:   Estill Cotta M.D. [facility] 01/04/2020, 12:53 PM

## 2020-01-04 NOTE — Progress Notes (Signed)
Pt is being discharged home today. Discharge instructions including medications and follow up appointments given. Pt has no further question at this time.

## 2020-01-04 NOTE — Progress Notes (Addendum)
ANTICOAGULATION CONSULT NOTE -  Consult  Pharmacy Consult for Heparin Indication: atrial fibrillation  Allergies  Allergen Reactions  . Zithromax [Azithromycin] Nausea And Vomiting    Patient Measurements: Height: 5\' 5"  (165.1 cm) Weight: 90 kg (198 lb 6.6 oz) IBW/kg (Calculated) : 57 Heparin Dosing Weight: 77 kg  Vital Signs: Temp: 98 F (36.7 C) (08/26 0614) BP: 155/70 (08/26 0614) Pulse Rate: 51 (08/26 0614)  Labs: Recent Labs    01/02/20 0425 01/02/20 0425 01/02/20 1551 01/03/20 0120 01/03/20 0120 01/03/20 1302 01/04/20 0027 01/04/20 1022  HGB 11.2*   < >  --  11.3*  --   --  11.5*  --   HCT 35.4*  --   --  35.3*  --   --  37.1  --   PLT 450*  --   --  424*  --   --  406*  --   APTT  --   --  36  --   --   --   --   --   LABPROT  --   --  14.1  --   --   --   --   --   INR  --   --  1.1  --   --   --   --   --   HEPARINUNFRC  --   --   --  0.98*   < > 0.88* 0.33 0.12*  CREATININE 3.05*  --   --  3.03*  --   --  2.92*  --    < > = values in this interval not displayed.    Estimated Creatinine Clearance: 20.4 mL/min (A) (by C-G formula based on SCr of 2.92 mg/dL (H)).   Medical History: Past Medical History:  Diagnosis Date  . Arthritis   . CHF (congestive heart failure) ([place])   . Fibroids   . Hypertension   . Lupus (systemic lupus erythematosus) ([place])   . Myocardial infarction ([place])   . Renal insufficiency     Medications:  Scheduled:  . vitamin C  500 mg Oral Daily  . dexamethasone (DECADRON) injection  6 mg Intravenous Daily  . diltiazem  30 mg Oral Q6H  . hydrALAZINE  50 mg Oral Q8H  . Ipratropium-Albuterol  1 puff Inhalation Q6H  . pantoprazole  40 mg Oral Daily  . zinc sulfate  220 mg Oral Daily   Infusions:  . sodium chloride    . heparin 600 Units/hr (01/03/20 1956)    Assessment: 38 yoF admitted on 12/30/19 with COVID-19, [name]  CHA2DS2VASc 4.  Pharmacy is consulted for IV heparin. No prior to  admission anticoagulants.  Today, 01/04/20  HL 0.12 now sub-therapeutic  Hgb 11.5 low but stable  Plt 406  SCr 2.92 mg/dl   No line issues  Some bleeding from IV site yesterday per RN but none since  Goal of Therapy:  Heparin level 0.3-0.7 units/ml Monitor platelets by anticoagulation protocol: Yes   Plan:   Increase heparin infusion to 700 units/hr  Obtain confirmatory heparin level 8 hours after previous level  Daily heparin level and CBC  Continue to monitor H&H and platelets  Monitor for signs and symptoms of bleeding   Ulice Dash, PharmD, BCPS [phone] 01/04/2020 11:03 AM

## 2020-01-04 NOTE — Progress Notes (Signed)
ANTICOAGULATION CONSULT NOTE -  Consult  Pharmacy Consult for Heparin Indication: atrial fibrillation  Allergies  Allergen Reactions   Zithromax [Azithromycin] Nausea And Vomiting    Patient Measurements: Height: 5\' 5"  (165.1 cm) Weight: 90 kg (198 lb 6.6 oz) IBW/kg (Calculated) : 57 Heparin Dosing Weight: 77 kg  Vital Signs: Temp: 98.4 F (36.9 C) (08/25 2047) BP: 161/83 (08/25 2047) Pulse Rate: 66 (08/25 2047)  Labs: Recent Labs    01/02/20 0425 01/02/20 0425 01/02/20 1551 01/03/20 0120 01/03/20 1302 01/04/20 0027  HGB 11.2*   < >  --  11.3*  --  11.5*  HCT 35.4*  --   --  35.3*  --  37.1  PLT 450*  --   --  424*  --  406*  APTT  --   --  36  --   --   --   LABPROT  --   --  14.1  --   --   --   INR  --   --  1.1  --   --   --   HEPARINUNFRC  --   --   --  0.98* 0.88* 0.33  CREATININE 3.05*  --   --  3.03*  --  2.92*   < > = values in this interval not displayed.    Estimated Creatinine Clearance: 20.4 mL/min (A) (by C-G formula based on SCr of 2.92 mg/dL (H)).   Medical History: Past Medical History:  Diagnosis Date   Arthritis    CHF (congestive heart failure) ([facility])    Fibroids    Hypertension    Lupus (systemic lupus erythematosus) ([place])    Myocardial infarction ([facility])    Renal insufficiency     Medications:  Scheduled:   vitamin C  500 mg Oral Daily   dexamethasone (DECADRON) injection  6 mg Intravenous Daily   diltiazem  30 mg Oral Q6H   hydrALAZINE  50 mg Oral Q8H   Ipratropium-Albuterol  1 puff Inhalation Q6H   pantoprazole  40 mg Oral Daily   zinc sulfate  220 mg Oral Daily   Infusions:   sodium chloride     heparin 600 Units/hr (01/03/20 1956)    Assessment: 72 yoF admitted on 12/30/19 with COVID-19, [name]  CHA2DS2VASc 4.  Pharmacy is consulted for IV heparin. No prior to admission anticoagulants. Inpatient anticoagulation with Heparin [place] 5000 units q8h for VTE prophylaxis.  Most  recent dose on 8/24 at 13:37 RN reports patient is a "hard stick" and has poor IV access. Hx AoCKD 4 (baseline SCr 3- 3.5), currently SCr 3.05 Hgb 11.2, Plt 450  Today, 01/04/20  HL 0.33, therapeutic  Hgb 11.5 low but stable  Plt 406  SCr 2.92 mg/dl   No line or bleeding issues per RN   Goal of Therapy:  Heparin level 0.3-0.7 units/ml Monitor platelets by anticoagulation protocol: Yes   Plan:   Continue heparin IV infusion at 600 units/hr  Obtain confirmatory heparin level 8 hours after previous level  Daily heparin level and CBC  Continue to monitor H&H and platelets  Monitor for signs and symptoms of bleeding     Royetta Asal, PharmD, BCPS 01/04/2020 3:16 AM

## 2020-01-04 NOTE — Progress Notes (Signed)
ANTICOAGULATION CONSULT NOTE -  Consult  Pharmacy Consult for Heparin >> apixaban Indication: atrial fibrillation  Allergies  Allergen Reactions  . Zithromax [Azithromycin] Nausea And Vomiting    Patient Measurements: Height: 5\' 5"  (165.1 cm) Weight: 90 kg (198 lb 6.6 oz) IBW/kg (Calculated) : 57 Heparin Dosing Weight: 77 kg  Vital Signs: Temp: 98 F (36.7 C) (08/26 0614) BP: 155/70 (08/26 0614) Pulse Rate: 51 (08/26 0614)  Labs: Recent Labs    01/02/20 0425 01/02/20 0425 01/02/20 1551 01/03/20 0120 01/03/20 0120 01/03/20 1302 01/04/20 0027 01/04/20 1022  HGB 11.2*   < >  --  11.3*  --   --  11.5*  --   HCT 35.4*  --   --  35.3*  --   --  37.1  --   PLT 450*  --   --  424*  --   --  406*  --   APTT  --   --  36  --   --   --   --   --   LABPROT  --   --  14.1  --   --   --   --   --   INR  --   --  1.1  --   --   --   --   --   HEPARINUNFRC  --   --   --  0.98*   < > 0.88* 0.33 0.12*  CREATININE 3.05*  --   --  3.03*  --   --  2.92*  --    < > = values in this interval not displayed.    Estimated Creatinine Clearance: 20.4 mL/min (A) (by C-G formula based on SCr of 2.92 mg/dL (H)).   Medical History: Past Medical History:  Diagnosis Date  . Arthritis   . CHF (congestive heart failure) ([place])   . Fibroids   . Hypertension   . Lupus (systemic lupus erythematosus) ([place])   . Myocardial infarction ([place])   . Renal insufficiency     Medications:  Scheduled:  . apixaban  5 mg Oral BID  . vitamin C  500 mg Oral Daily  . dexamethasone (DECADRON) injection  6 mg Intravenous Daily  . diltiazem  180 mg Oral Daily  . hydrALAZINE  50 mg Oral Q8H  . Ipratropium-Albuterol  1 puff Inhalation Q6H  . pantoprazole  40 mg Oral Daily  . zinc sulfate  220 mg Oral Daily   Infusions:  . sodium chloride      Assessment: 72 yoF admitted on 12/30/19 with COVID-19, [name]  CHA2DS2VASc 4.  Pharmacy is consulted for IV heparin. No prior to  admission anticoagulants.  Today, 01/04/20  SCr improving  Age < 80   Weight > 60 kg  Goal of Therapy:  Heparin level 0.3-0.7 units/ml Monitor platelets by anticoagulation protocol: Yes   Plan:   Transition to apixaban 5 mg bid  Will follow remotely for renal renal function, CBC, s/s of bleeding  Will sign off  Thank you for the consult.  Ulice Dash, PharmD, BCPS [phone] 01/04/2020 1:26 PM

## 2020-01-10 ENCOUNTER — Ambulatory Visit (INDEPENDENT_AMBULATORY_CARE_PROVIDER_SITE_OTHER): Payer: Medicare HMO

## 2020-01-10 DIAGNOSIS — I480 Paroxysmal atrial fibrillation: Secondary | ICD-10-CM

## 2020-01-23 DIAGNOSIS — M329 Systemic lupus erythematosus, unspecified: Secondary | ICD-10-CM | POA: Diagnosis not present

## 2020-01-23 DIAGNOSIS — N2581 Secondary hyperparathyroidism of renal origin: Secondary | ICD-10-CM | POA: Diagnosis not present

## 2020-01-23 DIAGNOSIS — N184 Chronic kidney disease, stage 4 (severe): Secondary | ICD-10-CM | POA: Diagnosis not present

## 2020-01-23 DIAGNOSIS — I129 Hypertensive chronic kidney disease with stage 1 through stage 4 chronic kidney disease, or unspecified chronic kidney disease: Secondary | ICD-10-CM | POA: Diagnosis not present

## 2020-01-23 DIAGNOSIS — Z8616 Personal history of COVID-19: Secondary | ICD-10-CM | POA: Diagnosis not present

## 2020-01-23 DIAGNOSIS — M109 Gout, unspecified: Secondary | ICD-10-CM | POA: Diagnosis not present

## 2020-01-26 DIAGNOSIS — I480 Paroxysmal atrial fibrillation: Secondary | ICD-10-CM | POA: Diagnosis not present

## 2020-01-26 NOTE — Progress Notes (Signed)
Cardiology Office Note   Date:  02/01/2020   ID:  Brittany Charles, DOB 02-Nov-1950, MRN [id number]  PCP:  Wendie Agreste, MD  Cardiologist: Dr. Marlou Porch, MD   Chief Complaint  Patient presents with  . Follow-up   History of Present Illness: Brittany Charles is a 69 y.o. female who presents for hospital follow up, seen for Dr. Marlou Porch.   Brittany Charles has a hx of non-obstructive CAD per [facility] 2012 with mild to moderate LAD stenosis with no significant RCA or circumflex disease, also with a hx of SVT (2015), CKD Stage IV, HTN, and lupus.   She was seen in 2015 with rapid HR with question of atrial flutter with variable block felt to be SVT. She was started on IV Diltiazem with conversion to NSR and prescribed Diltiazem CD 120mg  PO QD. EF was found to be 45-50%.   She was last seen 09/23/2018 in follow up and was doing well from a cardiology perspective. She is followed by Dr. Florene Glen for her renal care.   On 01/02/20 she was seen in cardiology consultation for tachy/brady syndrome with AKI and COVID. She was admitted initally for diarrhea and cough found to be COVID positive. She had been off Lasix for 3 days prior to admission. She began having episodes of tachycardia/AF with RVR and was treated with metoprolol and IV diltiazem    She converted to NSR and at cardiology sign off she was continued on Diltiazem CD 180mg  PO QD, Eliquis 5mg  PO BID with ZIO patch monitoring. Monitor showed sinus rhythm with average heart rate of 77 bpm. Occasional premature atrial contractions, 1.5%. Rare premature ventricular contractions, less than 1%. There were 2 episodes of ventricular tachycardia with 1 run lasting 21 seconds with an average heart rate of 131 bpm, slow VT. No symptoms reported. Another episode lasted 4 beats with a maximum rate of 164 bpm. Occasional episodes of SVT, paroxysmal atrial tachycardia were noted with the longest episode lasting 13 beats duration. No evidence of atrial  fibrillation.  Recommendation per Dr. Marlou Porch was to transition diltiazem to Toprol 100 mg p.o. daily to help suppress VT and SVT.   Today [name] is doing well from a CV standpoint.  She denies palpitations, LE edema, shortness of breath, PND, dizziness or syncope.  We reviewed her monitor results in depth.  Discussed stopping diltiazem as above and transitioning to Toprol 100 mg p.o. daily.  She is tolerating Eliquis however I will clarify the need to continue this with Dr. Marlou Porch and let the patient know.  Given VT on monitor, we discussed obtaining lab work today.  She has no other concerns.  Past Medical History:  Diagnosis Date  . Arthritis   . CHF (congestive heart failure) ([place])   . Fibroids   . Hypertension   . Lupus (systemic lupus erythematosus) ([place])   . Myocardial infarction ([place])   . Renal insufficiency     Past Surgical History:  Procedure Laterality Date  . ABDOMINAL HYSTERECTOMY  10/27/2000   TAH, BSO  . LEFT AND RIGHT HEART CATHETERIZATION WITH CORONARY ANGIOGRAM Right 04/21/2011   Procedure: LEFT AND RIGHT HEART CATHETERIZATION WITH CORONARY ANGIOGRAM;  Surgeon: [name], MD;  Location: [facility] CATH LAB;  Service: Cardiovascular;  Laterality: Right;  . TONSILLECTOMY       Current Outpatient Medications  Medication Sig Dispense Refill  . acetaminophen (TYLENOL) 325 MG tablet Take 650 mg by mouth as needed for mild pain or headache.     Marland Kitchen  albuterol (VENTOLIN HFA) 108 (90 Base) MCG/ACT inhaler Inhale 2 puffs into the lungs every 6 (six) hours as needed for wheezing or shortness of breath. 8 g 2  . allopurinol (ZYLOPRIM) 100 MG tablet TAKE 1 TABLET EVERY DAY 90 tablet 0  . apixaban (ELIQUIS) 5 MG TABS tablet Take 1 tablet (5 mg total) by mouth 2 (two) times daily. 60 tablet 2  . ascorbic acid (VITAMIN C) 500 MG tablet Take 1 tablet (500 mg total) by mouth daily. 30 tablet 0  . calcitRIOL (ROCALTROL) 0.25 MCG capsule Take 0.125 mcg by mouth daily.    Marland Kitchen COLCRYS 0.6  MG tablet Take 0.6 mg by mouth as needed (gout flareups).     . furosemide (LASIX) 80 MG tablet Take 1 tablet (80 mg total) by mouth daily. HOLD for 3 days, check your weight daily, resume per instructions.    Marland Kitchen guaiFENesin-dextromethorphan (ROBITUSSIN DM) 100-10 MG/5ML syrup Take 10 mLs by mouth every 4 (four) hours as needed for cough. 118 mL 0  . hydrALAZINE (APRESOLINE) 50 MG tablet TAKE 1 TABLET THREE TIMES DAILY 270 tablet 0  . pantoprazole (PROTONIX) 40 MG tablet Take 1 tablet (40 mg total) by mouth daily. 30 tablet 0  . predniSONE (DELTASONE) 50 MG tablet as needed.    . zinc sulfate 220 (50 Zn) MG capsule Take 1 capsule (220 mg total) by mouth daily. 30 capsule 0  . metoprolol succinate (TOPROL-XL) 100 MG 24 hr tablet Take 1 tablet (100 mg total) by mouth daily. Take with or immediately following a meal. 90 tablet 3   No current facility-administered medications for this visit.    Allergies:   Zithromax [azithromycin]    Social History:  The patient  reports that she has never smoked. She has never used smokeless tobacco. She reports that she does not drink alcohol and does not use drugs.   Family History:  The patient's family history includes Breast cancer in her unknown relative; Colon cancer in her unknown relative; Heart disease in her mother; Hyperlipidemia in her father; Hypertension in her father and mother; Stroke in her father, maternal grandmother, and mother.    ROS:  Please see the history of present illness. Otherwise, review of systems are positive for none.   All other systems are reviewed and negative.    PHYSICAL EXAM: VS:  BP 140/70   Pulse 94   Ht 5\' 5"  (1.651 m)   Wt 190 lb (86.2 kg)   SpO2 97%   BMI 31.62 kg/m  , BMI Body mass index is 31.62 kg/m.   General: Well developed, well nourished, NAD Neck: Negative for carotid bruits. No JVD Lungs:Clear to ausculation bilaterally. No wheezes, rales, or rhonchi. Breathing is unlabored. Cardiovascular: RRR  with S1 S2. No murmurs Extremities: No edema. Neuro: Alert and oriented. No focal deficits. No facial asymmetry. MAE spontaneously. Psych: Responds to questions appropriately with normal affect.    EKG:  EKG is not ordered today.  Recent Labs: 12/30/2019: B Natriuretic Peptide 31.6 01/04/2020: ALT 19; BUN 53; Creatinine, Ser 2.92; Hemoglobin 11.5; Magnesium 1.7; Platelets 406; Potassium 3.9; Sodium 138    Lipid Panel    Component Value Date/Time   CHOL 212 (H) 11/21/2018 1200   TRIG 189 (H) 11/21/2018 1200   HDL 42 11/21/2018 1200   CHOLHDL 5.0 (H) 11/21/2018 1200   CHOLHDL 6.2 04/17/2011 0700   VLDL 23 04/17/2011 0700   LDLCALC 132 (H) 11/21/2018 1200      Wt Readings from  Last 3 Encounters:  02/01/20 190 lb (86.2 kg)  12/30/19 198 lb 6.6 oz (90 kg)  11/21/18 199 lb 12.8 oz (90.6 kg)      Other studies Reviewed: Additional studies/ records that were reviewed today include:   Echocardiogram 01/03/2020:  1. Left ventricular ejection fraction, by estimation, is 65 to 70%. The  left ventricle has normal function. The left ventricle has no regional  wall motion abnormalities. Left ventricular diastolic parameters were  normal.  2. Right ventricular systolic function is normal. The right ventricular  size is normal. Tricuspid regurgitation signal is inadequate for assessing  PA pressure.  3. The mitral valve is grossly normal. No evidence of mitral valve  regurgitation. No evidence of mitral stenosis.  4. The aortic valve is tricuspid. Aortic valve regurgitation is not  visualized. Mild aortic valve sclerosis is present, with no evidence of  aortic valve stenosis.  5. The inferior vena cava is normal in size with greater than 50%  respiratory variability, suggesting right atrial pressure of 3 mmHg.    2-D echo 10/02/12 Study Conclusions  - Left ventricle: Diffuse hypokinesis worse in the inferior base The cavity size was mildly dilated. Wall thickness was  increased in a pattern of mild LVH. Systolic function was mildly reduced. The estimated ejection fraction was in the range of 45% to 50%. - Left atrium: The atrium was mildly dilated. - Right atrium: The atrium was mildly dilated. - Tricuspid valve: Mild-moderate regurgitation. - Pulmonary arteries: PA peak pressure: 37mm Hg (S). Transthoracic echocardiography.   ASSESSMENT AND PLAN:  1. SVT: -Per monitor review, patient had 2 episodes of ventricular tachycardia and occasional episode of SVT therefore plan was to stop diltiazem and start Toprol-XL 100 mg p.o. daily -Plan for follow-up in 4 to 6 weeks -Obtain BMET, CBC, magnesium  2. Cardiomyopathy: -LVEF at 50% per echo with repeat study during hospitalization at 65-70%, performed 01/03/20 -Continue hydralazine -No ACEI due to renal dysfunction  -Stop diltiazem, start Toprol XL -Appears euvolemic on exam -Change Lasix to as needed>> follows with nephrology with same recommendation  3. Essential HTN: -Stable, 140/70 -Continue hydralazine  -Mild LVH on prior echo -Add Toprol XL 100 mg p.o. daily  4. AF with RVR/VT: -Occurring during her hospitalization with conversion to NSR -Monitor showed sinus rhythm with average heart rate of 77 bpm. Occasional premature atrial contractions, 1.5%. Rare premature ventricular contractions, less than 1%. There were 2 episodes of ventricular tachycardia with 1 run lasting 21 seconds with an average heart rate of 131 bpm, slow VT. No symptoms reported. Another episode lasted 4 beats with a maximum rate of 164 bpm. Occasional episodes of SVT, paroxysmal atrial tachycardia were noted with the longest episode lasting 13 beats duration. No evidence of atrial fibrillation. -Recommendation was to change diltiazem to Toprol-XL 100 mg a day to see if this helps suppress both VT as well as SVT. No high risk features such as syncope. -CHA2DS2VASc 4 -We will continue Eliquis for now and discuss need with  primary cardiologist>>tolerating well  -Denies palpitations -BMET, Mg, CBC  5.  CKD stage IV: -Creatinine, 2.63 -Follows with nephrology  Current medicines are reviewed at length with the patient today.  The patient does not have concerns regarding medicines.  The following changes have been made: Add Toprol XL 100 mg p.o. daily, discontinue diltiazem  Labs/ tests ordered today include: BMET, CBC, Mg  Orders Placed This Encounter  Procedures  . Comprehensive metabolic panel  . Magnesium  . Basic  metabolic panel    Disposition:   FU with myself in 4 weeks  Signed, Kathyrn Drown, NP  02/01/2020 8:37 AM    [facility] [place], [place], [place]  [address] Phone: [phone]; Fax: [phone]

## 2020-02-01 ENCOUNTER — Other Ambulatory Visit: Payer: Self-pay

## 2020-02-01 ENCOUNTER — Encounter: Payer: Self-pay | Admitting: Cardiology

## 2020-02-01 ENCOUNTER — Ambulatory Visit: Payer: Medicare HMO | Admitting: Cardiology

## 2020-02-01 VITALS — BP 140/70 | HR 94 | Ht 65.0 in | Wt 190.0 lb

## 2020-02-01 DIAGNOSIS — I10 Essential (primary) hypertension: Secondary | ICD-10-CM | POA: Diagnosis not present

## 2020-02-01 DIAGNOSIS — I420 Dilated cardiomyopathy: Secondary | ICD-10-CM | POA: Diagnosis not present

## 2020-02-01 DIAGNOSIS — I480 Paroxysmal atrial fibrillation: Secondary | ICD-10-CM | POA: Diagnosis not present

## 2020-02-01 DIAGNOSIS — N184 Chronic kidney disease, stage 4 (severe): Secondary | ICD-10-CM | POA: Diagnosis not present

## 2020-02-01 DIAGNOSIS — I471 Supraventricular tachycardia: Secondary | ICD-10-CM

## 2020-02-01 LAB — COMPREHENSIVE METABOLIC PANEL
ALT: 8 IU/L (ref 0–32)
AST: 14 IU/L (ref 0–40)
Albumin/Globulin Ratio: 1.3 (ref 1.2–2.2)
Albumin: 3.9 g/dL (ref 3.8–4.8)
Alkaline Phosphatase: 164 IU/L — ABNORMAL HIGH (ref 44–121)
BUN/Creatinine Ratio: 7 — ABNORMAL LOW (ref 12–28)
BUN: 17 mg/dL (ref 8–27)
Bilirubin Total: <0.2 mg/dL (ref 0.0–1.2)
CO2: 15 mmol/L — ABNORMAL LOW (ref 20–29)
Calcium: 9.9 mg/dL (ref 8.7–10.3)
Chloride: 108 mmol/L — ABNORMAL HIGH (ref 96–106)
Creatinine, Ser: 2.49 mg/dL — ABNORMAL HIGH (ref 0.57–1.00)
GFR calc Af Amer: 22 mL/min/{1.73_m2} — ABNORMAL LOW (ref >59)
GFR calc non Af Amer: 19 mL/min/{1.73_m2} — ABNORMAL LOW (ref >59)
Globulin, Total: 3 g/dL (ref 1.5–4.5)
Glucose: 217 mg/dL — ABNORMAL HIGH (ref 65–99)
Potassium: 4.9 mmol/L (ref 3.5–5.2)
Sodium: 140 mmol/L (ref 134–144)
Total Protein: 6.9 g/dL (ref 6.0–8.5)

## 2020-02-01 LAB — MAGNESIUM: Magnesium: 1.9 mg/dL (ref 1.6–2.3)

## 2020-02-01 MED ORDER — METOPROLOL SUCCINATE ER 100 MG PO TB24: 100.0000 mg | ORAL_TABLET | Freq: Every day | ORAL | 3 refills | Status: DC

## 2020-02-01 NOTE — Patient Instructions (Addendum)
Medication Instructions:   1.Stop diltiazem 2.Start metoprolol (Toprol) 100 mg take one tablet by mouth daily.  *If you need a refill on your cardiac medications before your next appointment, please call your pharmacy*   Lab Work: CMET, BMET and Magnesium level today.  If you have labs (blood work) drawn today and your tests are completely normal, you will receive your results only by: Marland Kitchen MyChart Message (if you have MyChart) OR . A paper copy in the mail If you have any lab test that is abnormal or we need to change your treatment, we will call you to review the results.   Testing/Procedures: None   Follow-Up: At [facility], you and your health needs are our priority.  As part of our continuing mission to provide you with exceptional heart care, we have created designated Provider Care Teams.  These Care Teams include your primary Cardiologist (physician) and Advanced Practice Providers (APPs -  Physician Assistants and Nurse Practitioners) who all work together to provide you with the care you need, when you need it.  We recommend signing up for the patient portal called "MyChart".  Sign up information is provided on this After Visit Summary.  MyChart is used to connect with patients for Virtual Visits (Telemedicine).  Patients are able to view lab/test results, encounter notes, upcoming appointments, etc.  Non-urgent messages can be sent to your provider as well.   To learn more about what you can do with MyChart, go to [email or web address].    Your next appointment:   4-6 week(s)  The format for your next appointment:   In Person with an EKG  Provider:   Kathyrn Drown, NP

## 2020-02-05 ENCOUNTER — Other Ambulatory Visit: Payer: Self-pay

## 2020-03-10 NOTE — Progress Notes (Signed)
Cardiology Office Note   Date:  03/12/2020   ID:  Brittany Charles, DOB 1950/08/17, MRN [id number]  PCP:  Wendie Agreste, MD  Cardiologist: Dr. Marlou Porch, MD    Chief Complaint  Patient presents with   Follow-up    History of Present Illness: Brittany Charles is a 69 y.o. female who presents for follow up, seen for Dr. Marlou Porch.   Brittany Charles has a hx of non-obstructive CAD per [facility] 2012 with mild to moderate LAD stenosis with no significant RCA or circumflexdisease, also with a hx of SVT (2015), CKD Stage IV, HTN, and lupus.   She was seen in 2015 with rapid HR with question of atrial flutter with variable block felt to be SVT. She was started on IV Diltiazem with conversion to NSR and prescribed Diltiazem CD 120mg  PO QD. EF was found to be 45-50%.   She was seen 09/23/2018 in follow up and was doing well from a cardiology perspective. She is followed by Dr. Florene Glen for her renal care. On 01/02/20 she was seen in hospital cardiology consultation for tachy/brady syndrome with AKI and COVID. She was admitted initally for diarrhea and cough found to be COVID positive. She had been off Lasix for 3 days prior to admission. She began having episodes of tachycardia/AF with RVR and was treated with metoprolol and IV diltiazem    She converted to NSR and at cardiology sign off and was continued on Diltiazem CD 180mg  PO QD, Eliquis 5mg  PO BID with ZIO patch monitoring. Monitor showed sinus rhythm with average heart rate of 77 bpm. Occasional premature atrial contractions, 1.5%. Rare premature ventricular contractions, less than 1%. There were 2 episodes of ventricular tachycardia with 1 run lasting 21 seconds with an average heart rate of 131 bpm, slow VT. No symptoms reported. Another episode lasted 4 beats with a maximum rate of 164 bpm. Occasional episodes of SVT, paroxysmal atrial tachycardia were noted with the longest episode lasting 13 beats duration. No evidence of atrial  fibrillation.  Recommendations per Dr. Marlou Porch was to transition diltiazem to Toprol 100 mg p.o. daily to help suppress VT and SVT.   I then saw her in follow up 02/01/20 at which time she had stopped diltiazem as above and was transitioning to Toprol 100 mg p.o. daily. She was tolerating Eliquis without concern.  Today she presents for follow-up after medication changes and reports that she has been fairly fatigued since transitioning to Toprol-XL 100 mg.  We discussed adding diltiazem CD back to her regimen and a reduced dose of Toprol at 25 mg p.o. daily.  Otherwise, she has no specific complaints including chest pain, LE edema, shortness of breath, PND, dizziness, palpitations or syncope.  Past Medical History:  Diagnosis Date   Arthritis    CHF (congestive heart failure) (HCC)    Fibroids    Hypertension    Lupus (systemic lupus erythematosus) ([place])    Myocardial infarction [facility])    Renal insufficiency     Past Surgical History:  Procedure Laterality Date   ABDOMINAL HYSTERECTOMY  10/27/2000   TAH, BSO   LEFT AND RIGHT HEART CATHETERIZATION WITH CORONARY ANGIOGRAM Right 04/21/2011   Procedure: LEFT AND RIGHT HEART CATHETERIZATION WITH CORONARY ANGIOGRAM;  Surgeon: [name], MD;  Location: [facility] CATH LAB;  Service: Cardiovascular;  Laterality: Right;   TONSILLECTOMY       Current Outpatient Medications  Medication Sig Dispense Refill   acetaminophen (TYLENOL) 325 MG tablet Take 650 mg  by mouth as needed for mild pain or headache.      albuterol (VENTOLIN HFA) 108 (90 Base) MCG/ACT inhaler Inhale 2 puffs into the lungs every 6 (six) hours as needed for wheezing or shortness of breath. 8 g 2   allopurinol (ZYLOPRIM) 100 MG tablet TAKE 1 TABLET EVERY DAY 90 tablet 0   apixaban (ELIQUIS) 5 MG TABS tablet Take 1 tablet (5 mg total) by mouth 2 (two) times daily. 60 tablet 2   ascorbic acid (VITAMIN C) 500 MG tablet Take 1 tablet (500 mg total) by mouth daily. 30  tablet 0   calcitRIOL (ROCALTROL) 0.25 MCG capsule Take 0.125 mcg by mouth daily.     COLCRYS 0.6 MG tablet Take 0.6 mg by mouth as needed (gout flareups).      guaiFENesin-dextromethorphan (ROBITUSSIN DM) 100-10 MG/5ML syrup Take 10 mLs by mouth every 4 (four) hours as needed for cough. 118 mL 0   hydrALAZINE (APRESOLINE) 50 MG tablet TAKE 1 TABLET THREE TIMES DAILY 270 tablet 0   metoprolol succinate (TOPROL-XL) 100 MG 24 hr tablet Take 1 tablet (100 mg total) by mouth daily. Take with or immediately following a meal. 90 tablet 3   predniSONE (DELTASONE) 50 MG tablet as needed.     zinc sulfate 220 (50 Zn) MG capsule Take 1 capsule (220 mg total) by mouth daily. 30 capsule 0   No current facility-administered medications for this visit.    Allergies:   Zithromax [azithromycin]    Social History:  The patient  reports that she has never smoked. She has never used smokeless tobacco. She reports that she does not drink alcohol and does not use drugs.   Family History:  The patient's family history includes Breast cancer in her unknown relative; Colon cancer in her unknown relative; Heart disease in her mother; Hyperlipidemia in her father; Hypertension in her father and mother; Stroke in her father, maternal grandmother, and mother.    ROS:  Please see the history of present illness. Otherwise, review of systems are positive for none.   All other systems are reviewed and negative.    PHYSICAL EXAM: VS:  BP 138/68    Pulse 74    Ht 5\' 5"  (1.651 m)    Wt 193 lb (87.5 kg)    SpO2 99%    BMI 32.12 kg/m  , BMI Body mass index is 32.12 kg/m.   General: Well developed, well nourished, NAD Neck: Negative for carotid bruits. No JVD Lungs:Clear to ausculation bilaterally. No wheezes, rales, or rhonchi. Breathing is unlabored. Cardiovascular: RRR with S1 S2. No murmurs Extremities: No edema. Radial pulses 2+ bilaterally Neuro: Alert and oriented. No focal deficits. No facial asymmetry.  MAE spontaneously. Psych: Responds to questions appropriately with normal affect.     EKG:  EKG is ordered today. The ekg ordered today demonstrates NSR, HR 74 bpm   Recent Labs: 12/30/2019: B Natriuretic Peptide 31.6 01/04/2020: Hemoglobin 11.5; Platelets 406 02/01/2020: ALT 8; BUN 17; Creatinine, Ser 2.49; Magnesium 1.9; Potassium 4.9; Sodium 140    Lipid Panel    Component Value Date/Time   CHOL 212 (H) 11/21/2018 1200   TRIG 189 (H) 11/21/2018 1200   HDL 42 11/21/2018 1200   CHOLHDL 5.0 (H) 11/21/2018 1200   CHOLHDL 6.2 04/17/2011 0700   VLDL 23 04/17/2011 0700   LDLCALC 132 (H) 11/21/2018 1200      Wt Readings from Last 3 Encounters:  03/12/20 193 lb (87.5 kg)  02/01/20 190 lb (86.2  kg)  12/30/19 198 lb 6.6 oz (90 kg)    Other studies Reviewed: Additional studies/ records that were reviewed today include:  Review of the above records demonstrates:   Echocardiogram 01/03/2020:  1. Left ventricular ejection fraction, by estimation, is 65 to 70%. The  left ventricle has normal function. The left ventricle has no regional  wall motion abnormalities. Left ventricular diastolic parameters were  normal.  2. Right ventricular systolic function is normal. The right ventricular  size is normal. Tricuspid regurgitation signal is inadequate for assessing  PA pressure.  3. The mitral valve is grossly normal. No evidence of mitral valve  regurgitation. No evidence of mitral stenosis.  4. The aortic valve is tricuspid. Aortic valve regurgitation is not  visualized. Mild aortic valve sclerosis is present, with no evidence of  aortic valve stenosis.  5. The inferior vena cava is normal in size with greater than 50%  respiratory variability, suggesting right atrial pressure of 3 mmHg.    2-D echo 10/02/12 Study Conclusions  - Left ventricle: Diffuse hypokinesis worse in the inferior base The cavity size was mildly dilated. Wall thickness was increased in a pattern  of mild LVH. Systolic function was mildly reduced. The estimated ejection fraction was in the range of 45% to 50%. - Left atrium: The atrium was mildly dilated. - Right atrium: The atrium was mildly dilated. - Tricuspid valve: Mild-moderate regurgitation. - Pulmonary arteries: PA peak pressure: 35mm Hg (S). Transthoracic echocardiography.  ASSESSMENT AND PLAN:  1. SVT: -Per monitor review, patient had 2 episodes of ventricular tachycardia and occasional episode of SVT therefore plan was to stop diltiazem and start Toprol-XL 100 mg p.o. daily>> unfortunately, she had fairly significant fatigue with Toprol only therefore will add back diltiazem and reduce Toprol dosing. -Denies palpitations -Recent lab work with nephrology stable  2. Mild cardiomyopathy: -LVEF at 50% per echo with repeat study during hospitalization at 65-70%, performed 01/03/20 -Continue hydralazine -No ACEI due to renal dysfunction  -Appears euvolemic on exam -Lasix as needed>> follows with nephrology with same recommendation  3. Essential HTN: -Stable,  138/68 -Continue hydralazine  -Mild LVH on prior echo -Add diltiazem 125 to regimen and reduce dose of Toprol to 25 mg daily due to significant fatigue -Heart rate stable, 74 bpm   4. AF with RVR/VT: -Occurring during her hospitalization with conversion to NSR -Monitor showed sinus rhythm with average heart rate of 77 bpm. Occasional premature atrial contractions, 1.5%. Rare premature ventricular contractions, less than 1%. There were 2 episodes of ventricular tachycardia with 1 run lasting 21 seconds with an average heart rate of 131 bpm, slow VT. No symptoms reported. Another episode lasted 4 beats with a maximum rate of 164 bpm. Occasional episodes of SVT, paroxysmal atrial tachycardia were noted with the longest episode lasting 13 beats duration. No evidence of atrial fibrillation. -Initial plan was to transition diltiazem to Toprol XL 100 mg however  patient reports significant fatigue with this transition therefore will place her back on diltiazem 120 and reduced dose of Toprol to 25 mg daily given stable BP and HR  -[id number] 4 -Lab work stable for nephrology 9021  5.  CKD stage IV: -Creatinine, 2.63 -Follows with nephrology   Current medicines are reviewed at length with the patient today.  The patient does not have concerns regarding medicines.  The following changes have been made: Add diltiazem 120 daily, reduce Toprol to 25 mg daily  Labs/ tests ordered today include: None  No orders of the  defined types were placed in this encounter.   Disposition:   FU with Dr. Marlou Porch in 6 months  Signed, Kathyrn Drown, NP  03/12/2020 10:06 AM    [address], [place], [place]  [address] Phone: [phone]; Fax: [phone]

## 2020-03-12 ENCOUNTER — Ambulatory Visit: Payer: Medicare HMO | Admitting: Cardiology

## 2020-03-12 ENCOUNTER — Other Ambulatory Visit: Payer: Self-pay

## 2020-03-12 ENCOUNTER — Encounter: Payer: Self-pay | Admitting: Cardiology

## 2020-03-12 VITALS — BP 138/68 | HR 74 | Ht 65.0 in | Wt 193.0 lb

## 2020-03-12 DIAGNOSIS — I480 Paroxysmal atrial fibrillation: Secondary | ICD-10-CM

## 2020-03-12 DIAGNOSIS — I420 Dilated cardiomyopathy: Secondary | ICD-10-CM

## 2020-03-12 DIAGNOSIS — I471 Supraventricular tachycardia: Secondary | ICD-10-CM | POA: Diagnosis not present

## 2020-03-12 DIAGNOSIS — I10 Essential (primary) hypertension: Secondary | ICD-10-CM

## 2020-03-12 DIAGNOSIS — N184 Chronic kidney disease, stage 4 (severe): Secondary | ICD-10-CM

## 2020-03-12 MED ORDER — METOPROLOL SUCCINATE ER 25 MG PO TB24: 25.0000 mg | ORAL_TABLET | Freq: Every day | ORAL | 3 refills | Status: DC

## 2020-03-12 MED ORDER — DILTIAZEM HCL ER COATED BEADS 120 MG PO CP24: 120.0000 mg | ORAL_CAPSULE | Freq: Every day | ORAL | 3 refills | Status: DC

## 2020-03-12 NOTE — Patient Instructions (Addendum)
Medication Instructions:  1. Restart the diltiazem (cardizem cd) 120 mg, take one capsule by mouth daily 2. Decrease the metoprolol succinate (Toprol XL) to 25 mg, take one tablet by mouth daily. *If you need a refill on your cardiac medications before your next appointment, please call your pharmacy*   Lab Work: None If you have labs (blood work) drawn today and your tests are completely normal, you will receive your results only by: Marland Kitchen MyChart Message (if you have MyChart) OR . A paper copy in the mail If you have any lab test that is abnormal or we need to change your treatment, we will call you to review the results.   Testing/Procedures: None   Follow-Up: At [facility], you and your health needs are our priority.  As part of our continuing mission to provide you with exceptional heart care, we have created designated Provider Care Teams.  These Care Teams include your primary Cardiologist (physician) and Advanced Practice Providers (APPs -  Physician Assistants and Nurse Practitioners) who all work together to provide you with the care you need, when you need it.  We recommend signing up for the patient portal called "MyChart".  Sign up information is provided on this After Visit Summary.  MyChart is used to connect with patients for Virtual Visits (Telemedicine).  Patients are able to view lab/test results, encounter notes, upcoming appointments, etc.  Non-urgent messages can be sent to your provider as well.   To learn more about what you can do with MyChart, go to [email or web address].    Your next appointment:   6 month(s)  The format for your next appointment:   In Person  Provider:   Candee Furbish, MD

## 2020-03-14 ENCOUNTER — Telehealth: Payer: Self-pay | Admitting: *Deleted

## 2020-03-14 NOTE — Addendum Note (Signed)
Addended by: Georgiann Cocker on: 03/14/2020 12:54 PM   Modules accepted: Orders

## 2020-03-14 NOTE — Telephone Encounter (Signed)
Unable to reach by phone

## 2020-04-12 ENCOUNTER — Other Ambulatory Visit: Payer: Self-pay | Admitting: Cardiology

## 2020-04-12 NOTE — Telephone Encounter (Signed)
Prescription refill request for Eliquis received.  Last office visit: Mcdaniel, 03/12/2020 Scr: 2.49, 02/01/2020 Age: 69 Weight: 87.5 kg  Prescription refill sent.

## 2020-04-15 DIAGNOSIS — N184 Chronic kidney disease, stage 4 (severe): Secondary | ICD-10-CM | POA: Diagnosis not present

## 2020-04-25 DIAGNOSIS — M329 Systemic lupus erythematosus, unspecified: Secondary | ICD-10-CM | POA: Diagnosis not present

## 2020-04-25 DIAGNOSIS — I129 Hypertensive chronic kidney disease with stage 1 through stage 4 chronic kidney disease, or unspecified chronic kidney disease: Secondary | ICD-10-CM | POA: Diagnosis not present

## 2020-04-25 DIAGNOSIS — N184 Chronic kidney disease, stage 4 (severe): Secondary | ICD-10-CM | POA: Diagnosis not present

## 2020-04-25 DIAGNOSIS — N2581 Secondary hyperparathyroidism of renal origin: Secondary | ICD-10-CM | POA: Diagnosis not present

## 2020-04-25 DIAGNOSIS — M109 Gout, unspecified: Secondary | ICD-10-CM | POA: Diagnosis not present

## 2020-08-13 DIAGNOSIS — M109 Gout, unspecified: Secondary | ICD-10-CM | POA: Diagnosis not present

## 2020-08-13 DIAGNOSIS — M329 Systemic lupus erythematosus, unspecified: Secondary | ICD-10-CM | POA: Diagnosis not present

## 2020-08-13 DIAGNOSIS — N184 Chronic kidney disease, stage 4 (severe): Secondary | ICD-10-CM | POA: Diagnosis not present

## 2020-08-13 DIAGNOSIS — N2581 Secondary hyperparathyroidism of renal origin: Secondary | ICD-10-CM | POA: Diagnosis not present

## 2020-08-13 DIAGNOSIS — I129 Hypertensive chronic kidney disease with stage 1 through stage 4 chronic kidney disease, or unspecified chronic kidney disease: Secondary | ICD-10-CM | POA: Diagnosis not present

## 2020-09-18 ENCOUNTER — Telehealth: Payer: Self-pay | Admitting: Cardiology

## 2020-09-18 NOTE — Telephone Encounter (Signed)
Brittany Charles was calling to say that patient should be on statin because she has cardiovascular disease. Please advise

## 2020-09-19 NOTE — Telephone Encounter (Signed)
Agree with statin use.  Lets start Crestor 20 mg once a day.  Repeat lipid panel in 3 months with ALT. Candee Furbish, MD

## 2020-09-19 NOTE — Telephone Encounter (Signed)
  Ms. Trego with above hx cardiac cath 04/2011 with mild pulmonary HTN, mild to moderate LAD stenosis (30-40%, mLAD 50%), non obstructive CAD.  EF 55-65%. Last Lipid panel in chart from 11/21/2018 by Dr Merri Ray Total 212, Trig 189, HDL 42, calculated LDL 132.  Will forward to Dr Marlou Porch for review and any orders.

## 2020-09-19 NOTE — Telephone Encounter (Signed)
Attempted to contact pt by phone to discuss these new orders.  NA and no VM.  Will continue to attempt to contact pt.

## 2020-09-23 DIAGNOSIS — N184 Chronic kidney disease, stage 4 (severe): Secondary | ICD-10-CM | POA: Diagnosis not present

## 2020-09-25 NOTE — Telephone Encounter (Signed)
NA - no VM.  Will continue to attempt to contact pt regarding these orders.

## 2020-09-27 ENCOUNTER — Encounter: Payer: Self-pay | Admitting: *Deleted

## 2020-09-27 NOTE — Telephone Encounter (Signed)
Unable to reach by phone.  Letter of instructions/recommendations from Dr Marlou Porch mailed to pt's home address.

## 2020-10-03 DIAGNOSIS — M329 Systemic lupus erythematosus, unspecified: Secondary | ICD-10-CM | POA: Diagnosis not present

## 2020-10-03 DIAGNOSIS — M109 Gout, unspecified: Secondary | ICD-10-CM | POA: Diagnosis not present

## 2020-10-03 DIAGNOSIS — R7302 Impaired glucose tolerance (oral): Secondary | ICD-10-CM | POA: Diagnosis not present

## 2020-10-03 DIAGNOSIS — N2581 Secondary hyperparathyroidism of renal origin: Secondary | ICD-10-CM | POA: Diagnosis not present

## 2020-10-03 DIAGNOSIS — I129 Hypertensive chronic kidney disease with stage 1 through stage 4 chronic kidney disease, or unspecified chronic kidney disease: Secondary | ICD-10-CM | POA: Diagnosis not present

## 2020-10-03 DIAGNOSIS — E872 Acidosis: Secondary | ICD-10-CM | POA: Diagnosis not present

## 2020-10-03 DIAGNOSIS — N184 Chronic kidney disease, stage 4 (severe): Secondary | ICD-10-CM | POA: Diagnosis not present

## 2020-11-01 DIAGNOSIS — N184 Chronic kidney disease, stage 4 (severe): Secondary | ICD-10-CM | POA: Diagnosis not present

## 2020-11-05 DIAGNOSIS — R7302 Impaired glucose tolerance (oral): Secondary | ICD-10-CM | POA: Diagnosis not present

## 2020-11-05 DIAGNOSIS — I129 Hypertensive chronic kidney disease with stage 1 through stage 4 chronic kidney disease, or unspecified chronic kidney disease: Secondary | ICD-10-CM | POA: Diagnosis not present

## 2020-11-05 DIAGNOSIS — N2581 Secondary hyperparathyroidism of renal origin: Secondary | ICD-10-CM | POA: Diagnosis not present

## 2020-11-05 DIAGNOSIS — E872 Acidosis: Secondary | ICD-10-CM | POA: Diagnosis not present

## 2020-11-05 DIAGNOSIS — N184 Chronic kidney disease, stage 4 (severe): Secondary | ICD-10-CM | POA: Diagnosis not present

## 2020-11-05 DIAGNOSIS — M329 Systemic lupus erythematosus, unspecified: Secondary | ICD-10-CM | POA: Diagnosis not present

## 2020-11-05 DIAGNOSIS — M109 Gout, unspecified: Secondary | ICD-10-CM | POA: Diagnosis not present

## 2020-11-26 NOTE — Progress Notes (Signed)
Cardiology Office Note    Date:  12/10/2020   ID:  MERILYNN HAYDU, DOB [birth year], MRN [id number]   PCP:  Wendie Agreste, MD   [place]  Cardiologist:  Candee Furbish, MD   Advanced Practice Provider:  No care team member to display Electrophysiologist:  None   [id number]}   No chief complaint on file.   History of Present Illness:  Brittany Charles is a 70 y.o. female a hx of non-obstructive CAD per LHC 2012 with mild to moderate LAD stenosis with no significant RCA or circumflex disease, also with a hx of SVT (2015), CKD Stage IV, HTN, and lupus.  She had A. fib with RVR when hospitalized 12/2019 with AKI and COVID-positive.  She converted to normal sinus rhythm and follow-up ZIO monitor showed 2 episodes of ventricular tachycardia 1 run lasting 21 seconds average heart rate of 131, occasional SVT and PAT longest 13 beats.  Dr. Marlou Porch recommended changing her from diltiazem to Toprol but at follow-up she did not tolerate it well.  Toprol was reduced and she was transitioned back to low-dose diltiazem.  She was started on Crestor 20 mg daily for elevated lipids 09/2020.  Patient comes in for f/u. Doing well. Denies chest pain, dyspnea, palpitations, dizziness or presyncope. Never started crestor. Takes care of 4 grandsons. Walks with a cane. Crt 3.43 in June but patient says it came down on recheck when she started drinking more water.      Past Medical History:  Diagnosis Date   Arthritis    CHF (congestive heart failure) (HCC)    Fibroids    Hypertension    Lupus (systemic lupus erythematosus) ([place])    Myocardial infarction [facility])    Renal insufficiency     Past Surgical History:  Procedure Laterality Date   ABDOMINAL HYSTERECTOMY  10/27/2000   TAH, BSO   LEFT AND RIGHT HEART CATHETERIZATION WITH CORONARY ANGIOGRAM Right 04/21/2011   Procedure: LEFT AND RIGHT HEART CATHETERIZATION WITH CORONARY ANGIOGRAM;  Surgeon: [name], MD;   Location: [facility] CATH LAB;  Service: Cardiovascular;  Laterality: Right;   TONSILLECTOMY      Current Medications: Current Meds  Medication Sig   acetaminophen (TYLENOL) 325 MG tablet Take 650 mg by mouth as needed for mild pain or headache.    albuterol (VENTOLIN HFA) 108 (90 Base) MCG/ACT inhaler Inhale 2 puffs into the lungs every 6 (six) hours as needed for wheezing or shortness of breath.   allopurinol (ZYLOPRIM) 100 MG tablet TAKE 1 TABLET EVERY DAY   ascorbic acid (VITAMIN C) 500 MG tablet Take 1 tablet (500 mg total) by mouth daily.   calcitRIOL (ROCALTROL) 0.25 MCG capsule Take 0.125 mcg by mouth daily.   DILT-XR 180 MG 24 hr capsule    ELIQUIS 5 MG TABS tablet TAKE 1 TABLET(5 MG) BY MOUTH TWICE DAILY   hydrALAZINE (APRESOLINE) 50 MG tablet TAKE 1 TABLET THREE TIMES DAILY   metoprolol succinate (TOPROL-XL) 25 MG 24 hr tablet Take 1 tablet (25 mg total) by mouth daily. Take with or immediately following a meal.   predniSONE (DELTASONE) 50 MG tablet as needed.   rosuvastatin (CRESTOR) 20 MG tablet Take 1 tablet (20 mg total) by mouth daily.   zinc sulfate 220 (50 Zn) MG capsule Take 1 capsule (220 mg total) by mouth daily.     Allergies:   Zithromax [azithromycin]   Social History   Socioeconomic History   Marital status: Single  Spouse name: Not on file   Number of children: Not on file   Years of education: Not on file   Highest education level: Not on file  Occupational History   Not on file  Tobacco Use   Smoking status: Never   Smokeless tobacco: Never  Vaping Use   Vaping Use: Never used  Substance and Sexual Activity   Alcohol use: No   Drug use: No   Sexual activity: Never  Other Topics Concern   Not on file  Social History Narrative   Not on file   Social Determinants of Health   Financial Resource Strain: Not on file  Food Insecurity: Not on file  Transportation Needs: Not on file  Physical Activity: Not on file  Stress: Not on file  Social  Connections: Not on file     Family History:  The patient's  family history includes Breast cancer in her unknown relative; Colon cancer in her unknown relative; Heart disease in her mother; Hyperlipidemia in her father; Hypertension in her father and mother; Stroke in her father, maternal grandmother, and mother.   ROS:   Please see the history of present illness.    ROS All other systems reviewed and are negative.   PHYSICAL EXAM:   VS:  BP 140/74   Pulse 77   Ht 5\' 5"  (1.651 m)   Wt 195 lb (88.5 kg)   SpO2 99%   BMI 32.45 kg/m   Physical Exam  GEN: Well nourished, well developed, in no acute distress  Neck: no JVD, carotid bruits, or masses Cardiac:RRR; no murmurs, rubs, or gallops  Respiratory:  clear to auscultation bilaterally, normal work of breathing GI: soft, nontender, nondistended, + BS Ext: without cyanosis, clubbing, or edema, Good distal pulses bilaterally Neuro:  Alert and Oriented x 3 Psych: euthymic mood, full affect  Wt Readings from Last 3 Encounters:  12/10/20 195 lb (88.5 kg)  03/12/20 193 lb (87.5 kg)  02/01/20 190 lb (86.2 kg)      Studies/Labs Reviewed:   EKG:  EKG is not ordered today.     Recent Labs: 12/30/2019: B Natriuretic Peptide 31.6 01/04/2020: Hemoglobin 11.5; Platelets 406 02/01/2020: ALT 8; BUN 17; Creatinine, Ser 2.49; Magnesium 1.9; Potassium 4.9; Sodium 140   Lipid Panel    Component Value Date/Time   CHOL 212 (H) 11/21/2018 1200   TRIG 189 (H) 11/21/2018 1200   HDL 42 11/21/2018 1200   CHOLHDL 5.0 (H) 11/21/2018 1200   CHOLHDL 6.2 04/17/2011 0700   VLDL 23 04/17/2011 0700   LDLCALC 132 (H) 11/21/2018 1200    Additional studies/ records that were reviewed today include:   ZIO monitor 01/26/20 Study Highlights    Sinus rhythm with average heart rate of 77 bpm Occasional premature atrial contractions, 1.5% Rare premature ventricular contractions, less than 1% There were 2 episodes of ventricular tachycardia with 1 run  lasting 21 seconds with an average heart rate of 131 bpm, slow VT. No symptoms reported. Another episode lasted 4 beats with a maximum rate of 164 bpm. Occasional episodes of SVT, paroxysmal atrial tachycardia were noted with the longest episode lasting 13 beats duration. No evidence of atrial fibrillation.   Slow ventricular tachycardia noted.  Ejection fraction normal on echocardiogram.  On diltiazem CD 180 mg once a day. Recommendation would be to change diltiazem to Toprol-XL 100 mg a day to see if this helps suppress both VT as well as SVT. No high risk features such as syncope. Candee Furbish,  MD     Echocardiogram 01/03/2020:   1. Left ventricular ejection fraction, by estimation, is 65 to 70%. The  left ventricle has normal function. The left ventricle has no regional  wall motion abnormalities. Left ventricular diastolic parameters were  normal.   2. Right ventricular systolic function is normal. The right ventricular  size is normal. Tricuspid regurgitation signal is inadequate for assessing  PA pressure.   3. The mitral valve is grossly normal. No evidence of mitral valve  regurgitation. No evidence of mitral stenosis.   4. The aortic valve is tricuspid. Aortic valve regurgitation is not  visualized. Mild aortic valve sclerosis is present, with no evidence of  aortic valve stenosis.   5. The inferior vena cava is normal in size with greater than 50%  respiratory variability, suggesting right atrial pressure of 3 mmHg.     2-D echo 10/02/12 Study Conclusions  - Left ventricle: Diffuse hypokinesis worse in the inferior   base The cavity size was mildly dilated. Wall thickness   was increased in a pattern of mild LVH. Systolic function   was mildly reduced. The estimated ejection fraction was in   the range of 45% to 50%. - Left atrium: The atrium was mildly dilated. - Right atrium: The atrium was mildly dilated. - Tricuspid valve: Mild-moderate regurgitation. - Pulmonary  arteries: PA peak pressure: 43mm Hg (S). Transthoracic echocardiography.    Risk Assessment/Calculations:    CHA2DS2-VASc Score = 4  This indicates a 4.8% annual risk of stroke. The patient's score is based upon: CHF History: No HTN History: Yes Diabetes History: No Stroke History: No Vascular Disease History: Yes Age Score: 1 Gender Score: 1       ASSESSMENT:    1. Coronary artery disease involving native coronary artery of native heart without angina pectoris   2. PSVT (paroxysmal supraventricular tachycardia) ([place])   3. NSVT (nonsustained ventricular tachycardia) (HCC)   4. Paroxysmal atrial fibrillation ([place])   5. CKD (chronic kidney disease) stage 4, GFR 15-29 ml/min (HCC)      PLAN:  In order of problems listed above:  Nonobstructive CAD on cardiac cath 2012-no chest pain  SVT/NSVT controlled on Toprol and diltiazem  PAF with RVR during hospitalization 02/2020 in the setting of COVID and AKI.  CHA2DS2-VASc equals 4 on Eliquis-check CBC with bmet next week.  CKD stage IV followed by nephrology-up to 3.43 in June but patient says it's come down.  HLD -never started crestor-will order and check lipids in 3 months.  Shared Decision Making/Informed Consent        Medication Adjustments/Labs and Tests Ordered: Current medicines are reviewed at length with the patient today.  Concerns regarding medicines are outlined above.  Medication changes, Labs and Tests ordered today are listed in the Patient Instructions below. Patient Instructions  Medication Instructions:  Start Rosuvastatin (Crestor) 20 mg daily   *If you need a refill on your cardiac medications before your next appointment, please call your pharmacy*   Lab Work: Your physician recommends that you return for a FASTING lipid profile and hepatic function test on 03/12/2021, Lab is open from 7:30 am to 4:45 pm   If you have labs (blood work) drawn today and your tests are completely normal, you will  receive your results only by: [place] (if you have MyChart) OR A paper copy in the mail If you have any lab test that is abnormal or we need to change your treatment, we will call you to review  the results.   Testing/Procedures: None ordered    Follow-Up: At [facility], you and your health needs are our priority.  As part of our continuing mission to provide you with exceptional heart care, we have created designated Provider Care Teams.  These Care Teams include your primary Cardiologist (physician) and Advanced Practice Providers (APPs -  Physician Assistants and Nurse Practitioners) who all work together to provide you with the care you need, when you need it.  We recommend signing up for the patient portal called "MyChart".  Sign up information is provided on this After Visit Summary.  MyChart is used to connect with patients for Virtual Visits (Telemedicine).  Patients are able to view lab/test results, encounter notes, upcoming appointments, etc.  Non-urgent messages can be sent to your provider as well.   To learn more about what you can do with MyChart, go to [email or web address].    Your next appointment:   6 month(s)  The format for your next appointment:   In Person  Provider:   You may see Candee Furbish, MD or one of the following Advanced Practice Providers on your designated Care Team:   Cecilie Kicks, NP   Other Instructions None     Signed, Ermalinda Barrios, PA-C  12/10/2020 8:25 AM    [address], [place], [place]  [address] Phone: [phone]; Fax: [phone]

## 2020-12-10 ENCOUNTER — Encounter: Payer: Self-pay | Admitting: Physician Assistant

## 2020-12-10 ENCOUNTER — Other Ambulatory Visit: Payer: Self-pay

## 2020-12-10 ENCOUNTER — Ambulatory Visit: Payer: Medicare HMO | Admitting: Physician Assistant

## 2020-12-10 VITALS — BP 140/74 | HR 77 | Ht 65.0 in | Wt 195.0 lb

## 2020-12-10 DIAGNOSIS — I480 Paroxysmal atrial fibrillation: Secondary | ICD-10-CM

## 2020-12-10 DIAGNOSIS — N184 Chronic kidney disease, stage 4 (severe): Secondary | ICD-10-CM | POA: Diagnosis not present

## 2020-12-10 DIAGNOSIS — I472 Ventricular tachycardia: Secondary | ICD-10-CM | POA: Diagnosis not present

## 2020-12-10 DIAGNOSIS — I4729 Other ventricular tachycardia: Secondary | ICD-10-CM

## 2020-12-10 DIAGNOSIS — I471 Supraventricular tachycardia: Secondary | ICD-10-CM | POA: Diagnosis not present

## 2020-12-10 DIAGNOSIS — I2510 Atherosclerotic heart disease of native coronary artery without angina pectoris: Secondary | ICD-10-CM | POA: Diagnosis not present

## 2020-12-10 MED ORDER — ROSUVASTATIN CALCIUM 20 MG PO TABS: 20.0000 mg | ORAL_TABLET | Freq: Every day | ORAL | 3 refills | Status: DC

## 2020-12-10 MED ORDER — APIXABAN 5 MG PO TABS: 5.0000 mg | ORAL_TABLET | Freq: Two times a day (BID) | ORAL | 5 refills | Status: DC

## 2020-12-10 NOTE — Patient Instructions (Addendum)
Medication Instructions:  Start Rosuvastatin (Crestor) 20 mg daily   *If you need a refill on your cardiac medications before your next appointment, please call your pharmacy*   Lab Work: Your physician recommends that you return for a FASTING lipid profile and hepatic function test on 03/12/2021, Lab is open from 7:30 am to 4:45 pm   If you have labs (blood work) drawn today and your tests are completely normal, you will receive your results only by: [place] (if you have MyChart) OR A paper copy in the mail If you have any lab test that is abnormal or we need to change your treatment, we will call you to review the results.   Testing/Procedures: None ordered    Follow-Up: At [facility], you and your health needs are our priority.  As part of our continuing mission to provide you with exceptional heart care, we have created designated Provider Care Teams.  These Care Teams include your primary Cardiologist (physician) and Advanced Practice Providers (APPs -  Physician Assistants and Nurse Practitioners) who all work together to provide you with the care you need, when you need it.  We recommend signing up for the patient portal called "MyChart".  Sign up information is provided on this After Visit Summary.  MyChart is used to connect with patients for Virtual Visits (Telemedicine).  Patients are able to view lab/test results, encounter notes, upcoming appointments, etc.  Non-urgent messages can be sent to your provider as well.   To learn more about what you can do with MyChart, go to [email or web address].    Your next appointment:   6 month(s)  The format for your next appointment:   In Person  Provider:   You may see Candee Furbish, MD or one of the following Advanced Practice Providers on your designated Care Team:   Cecilie Kicks, NP   Other Instructions None

## 2020-12-10 NOTE — Telephone Encounter (Signed)
Prescription refill request for Eliquis received. Indication: afib  Last office visit: 8/2/2022[place]: 2.49, 02/01/2020 Age:  70 yo  Weight: 88.5 kg   Refill sent.

## 2021-01-07 DIAGNOSIS — M329 Systemic lupus erythematosus, unspecified: Secondary | ICD-10-CM | POA: Diagnosis not present

## 2021-01-07 DIAGNOSIS — N2581 Secondary hyperparathyroidism of renal origin: Secondary | ICD-10-CM | POA: Diagnosis not present

## 2021-01-07 DIAGNOSIS — M109 Gout, unspecified: Secondary | ICD-10-CM | POA: Diagnosis not present

## 2021-01-07 DIAGNOSIS — R7302 Impaired glucose tolerance (oral): Secondary | ICD-10-CM | POA: Diagnosis not present

## 2021-01-07 DIAGNOSIS — E872 Acidosis: Secondary | ICD-10-CM | POA: Diagnosis not present

## 2021-01-07 DIAGNOSIS — N184 Chronic kidney disease, stage 4 (severe): Secondary | ICD-10-CM | POA: Diagnosis not present

## 2021-01-07 DIAGNOSIS — I129 Hypertensive chronic kidney disease with stage 1 through stage 4 chronic kidney disease, or unspecified chronic kidney disease: Secondary | ICD-10-CM | POA: Diagnosis not present

## 2021-01-16 ENCOUNTER — Encounter: Payer: Self-pay | Admitting: Registered Nurse

## 2021-01-16 ENCOUNTER — Other Ambulatory Visit: Payer: Self-pay

## 2021-01-16 ENCOUNTER — Ambulatory Visit (INDEPENDENT_AMBULATORY_CARE_PROVIDER_SITE_OTHER): Payer: Medicare HMO | Admitting: Registered Nurse

## 2021-01-16 VITALS — BP 160/70 | HR 77 | Temp 98.1°F | Resp 18 | Ht 65.0 in | Wt 198.2 lb

## 2021-01-16 DIAGNOSIS — Z0000 Encounter for general adult medical examination without abnormal findings: Secondary | ICD-10-CM | POA: Diagnosis not present

## 2021-01-16 DIAGNOSIS — Z1211 Encounter for screening for malignant neoplasm of colon: Secondary | ICD-10-CM | POA: Diagnosis not present

## 2021-01-16 DIAGNOSIS — N184 Chronic kidney disease, stage 4 (severe): Secondary | ICD-10-CM | POA: Diagnosis not present

## 2021-01-16 DIAGNOSIS — Z90710 Acquired absence of both cervix and uterus: Secondary | ICD-10-CM

## 2021-01-16 DIAGNOSIS — Z23 Encounter for immunization: Secondary | ICD-10-CM

## 2021-01-16 NOTE — Patient Instructions (Signed)
Ms. Cassar -   Today: Flu shot and shingles shot  Referrals: GI and Gyn  On you: Dentist Covid shot  Come back: 2 mo for second shingles shot   Thank you for your patience today! It's greatly appreciated.   Rich

## 2021-01-16 NOTE — Progress Notes (Addendum)
Established Patient Office Visit  Subjective:  Patient ID: Brittany Charles, female    DOB: 1950/10/04  Age: 70 y.o. MRN: [id number]  CC:  Chief Complaint  Patient presents with   health screening discussion    HPI Brittany Charles presents for health screening and CPE  Unfortunately has CKD stage 4 secondary to HTN.   She has been managed through Dr. Johnney Ou and is now in touch with [facility] transplant team to discuss renal transplant. Needs a number of health maintenance pieces to be eligible:  Colonoscopy: Last in 2018 with recommended 1 year follow up per Dr. Silverio Decamp. Will place referral today. Dentist: follows with community dentist. Will schedule with this provider for clearance. Mammogram: None on record. Will order today. Pap Smear / Pelvic Exam (women age 65+ per MD rec):  No pap on record. Will refer to Gyn today. Likely she will not need pap or pelvic as she is s/p abd hysterectomy (2002) and has aged out of routine pap screening. CDC recommended vaccines: COVID-19 (2 shots with booster): 07/02/19, 07/25/19, plans to get booster soon 2 dose Shingles: has not received, plan to admin first shot today Annual Flu: has not received, plan to admin today. Pneumococcal Vaccine: up to date, PCV 13 on 11/21/18, PCV 23 on 01/12/2017 Hepatitis B series: none on record, will draw titers  No further concerns at this time.   Past Medical History:  Diagnosis Date   Arthritis    CHF (congestive heart failure) (HCC)    Fibroids    Hypertension    Lupus (systemic lupus erythematosus) ([place])    Myocardial infarction [facility])    Renal insufficiency     Past Surgical History:  Procedure Laterality Date   ABDOMINAL HYSTERECTOMY  10/27/2000   TAH, BSO   LEFT AND RIGHT HEART CATHETERIZATION WITH CORONARY ANGIOGRAM Right 04/21/2011   Procedure: LEFT AND RIGHT HEART CATHETERIZATION WITH CORONARY ANGIOGRAM;  Surgeon: [name], MD;  Location: [facility] CATH LAB;  Service: Cardiovascular;   Laterality: Right;   TONSILLECTOMY      Family History  Problem Relation Age of Onset   Hypertension Mother    Heart disease Mother        CHF   Stroke Mother    Hyperlipidemia Father    Stroke Father    Hypertension Father    Stroke Maternal Grandmother    Colon cancer Unknown    Breast cancer Unknown     Social History   Socioeconomic History   Marital status: Single    Spouse name: Not on file   Number of children: Not on file   Years of education: Not on file   Highest education level: Not on file  Occupational History   Not on file  Tobacco Use   Smoking status: Never   Smokeless tobacco: Never  Vaping Use   Vaping Use: Never used  Substance and Sexual Activity   Alcohol use: No   Drug use: No   Sexual activity: Never  Other Topics Concern   Not on file  Social History Narrative   Not on file   Social Determinants of Health   Financial Resource Strain: Not on file  Food Insecurity: Not on file  Transportation Needs: Not on file  Physical Activity: Not on file  Stress: Not on file  Social Connections: Not on file  Intimate Partner Violence: Not on file    Outpatient Medications Prior to Visit  Medication Sig Dispense Refill   acetaminophen (TYLENOL) 325  MG tablet Take 650 mg by mouth as needed for mild pain or headache.      albuterol (VENTOLIN HFA) 108 (90 Base) MCG/ACT inhaler Inhale 2 puffs into the lungs every 6 (six) hours as needed for wheezing or shortness of breath. 8 g 2   allopurinol (ZYLOPRIM) 100 MG tablet TAKE 1 TABLET EVERY DAY 90 tablet 0   apixaban (ELIQUIS) 5 MG TABS tablet Take 1 tablet (5 mg total) by mouth 2 (two) times daily. 60 tablet 5   ascorbic acid (VITAMIN C) 500 MG tablet Take 1 tablet (500 mg total) by mouth daily. 30 tablet 0   calcitRIOL (ROCALTROL) 0.25 MCG capsule Take 0.125 mcg by mouth daily.     DILT-XR 180 MG 24 hr capsule      doxazosin (CARDURA) 1 MG tablet      hydrALAZINE (APRESOLINE) 50 MG tablet TAKE 1  TABLET THREE TIMES DAILY 270 tablet 0   metoprolol succinate (TOPROL-XL) 25 MG 24 hr tablet Take 1 tablet (25 mg total) by mouth daily. Take with or immediately following a meal. 90 tablet 3   predniSONE (DELTASONE) 50 MG tablet as needed.     rosuvastatin (CRESTOR) 20 MG tablet Take 1 tablet (20 mg total) by mouth daily. 90 tablet 3   zinc sulfate 220 (50 Zn) MG capsule Take 1 capsule (220 mg total) by mouth daily. 30 capsule 0   No facility-administered medications prior to visit.    Allergies  Allergen Reactions   Zithromax [Azithromycin] Nausea And Vomiting    ROS Review of Systems Per hpi    Objective:    Physical Exam Vitals and nursing note reviewed.  Constitutional:      General: She is not in acute distress.    Appearance: Normal appearance. She is normal weight. She is not ill-appearing, toxic-appearing or diaphoretic.  HENT:     Head: Normocephalic and atraumatic.     Right Ear: Tympanic membrane, ear canal and external ear normal. There is no impacted cerumen.     Left Ear: Tympanic membrane, ear canal and external ear normal. There is no impacted cerumen.     Nose: Nose normal. No congestion or rhinorrhea.     Mouth/Throat:     Mouth: Mucous membranes are moist.     Pharynx: Oropharynx is clear. No oropharyngeal exudate or posterior oropharyngeal erythema.  Eyes:     General: No scleral icterus.       Right eye: No discharge.        Left eye: No discharge.     Extraocular Movements: Extraocular movements intact.     Conjunctiva/sclera: Conjunctivae normal.     Pupils: Pupils are equal, round, and reactive to light.  Cardiovascular:     Rate and Rhythm: Normal rate and regular rhythm.     Pulses: Normal pulses.     Heart sounds: Normal heart sounds. No murmur heard.   No friction rub. No gallop.  Pulmonary:     Effort: Pulmonary effort is normal. No respiratory distress.     Breath sounds: Normal breath sounds. No stridor. No wheezing, rhonchi or rales.   Chest:     Chest wall: No tenderness.  Abdominal:     General: Abdomen is flat. Bowel sounds are normal. There is no distension.     Palpations: Abdomen is soft. There is no mass.     Tenderness: There is no abdominal tenderness. There is no right CVA tenderness, left CVA tenderness, guarding or rebound.  Hernia: No hernia is present.  Musculoskeletal:        General: No swelling, tenderness, deformity or signs of injury. Normal range of motion.     Right lower leg: No edema.     Left lower leg: No edema.  Skin:    General: Skin is warm and dry.     Capillary Refill: Capillary refill takes less than 2 seconds.     Coloration: Skin is not jaundiced or pale.     Findings: No bruising, erythema, lesion or rash.  Neurological:     General: No focal deficit present.     Mental Status: She is alert and oriented to person, place, and time. Mental status is at baseline.     Cranial Nerves: No cranial nerve deficit.     Sensory: No sensory deficit.     Motor: No weakness.     Coordination: Coordination normal.     Gait: Gait normal.     Deep Tendon Reflexes: Reflexes normal.  Psychiatric:        Mood and Affect: Mood normal.        Behavior: Behavior normal.        Thought Content: Thought content normal.        Judgment: Judgment normal.    BP (!) 160/70   Pulse 77   Temp 98.1 F (36.7 C) (Temporal)   Resp 18   Ht 5\' 5"  (1.651 m)   Wt 198 lb 3.2 oz (89.9 kg)   SpO2 98%   BMI 32.98 kg/m  Wt Readings from Last 3 Encounters:  01/16/21 198 lb 3.2 oz (89.9 kg)  12/10/20 195 lb (88.5 kg)  03/12/20 193 lb (87.5 kg)     Health Maintenance Due  Topic Date Due   Hepatitis C Screening  Never done   TETANUS/TDAP  Never done   MAMMOGRAM  Never done   Zoster Vaccines- Shingrix (1 of 2) Never done   DEXA SCAN  Never done   COLONOSCOPY (Pts 45-45yrs Insurance coverage will need to be confirmed)  03/09/2018   COVID-19 Vaccine (3 - Booster for Pfizer series) 12/25/2019    INFLUENZA VACCINE  12/09/2020    There are no preventive care reminders to display for this patient.  Lab Results  Component Value Date   TSH 1.297 10/17/2012   Lab Results  Component Value Date   WBC 8.8 01/04/2020   HGB 11.5 (L) 01/04/2020   HCT 37.1 01/04/2020   MCV 87.9 01/04/2020   PLT 406 (H) 01/04/2020   Lab Results  Component Value Date   NA 140 02/01/2020   K 4.9 02/01/2020   CO2 15 (L) 02/01/2020   GLUCOSE 217 (H) 02/01/2020   BUN 17 02/01/2020   CREATININE 2.49 (H) 02/01/2020   BILITOT <0.2 02/01/2020   ALKPHOS 164 (H) 02/01/2020   AST 14 02/01/2020   ALT 8 02/01/2020   PROT 6.9 02/01/2020   ALBUMIN 3.9 02/01/2020   CALCIUM 9.9 02/01/2020   ANIONGAP 11 01/04/2020   Lab Results  Component Value Date   CHOL 212 (H) 11/21/2018   Lab Results  Component Value Date   HDL 42 11/21/2018   Lab Results  Component Value Date   LDLCALC 132 (H) 11/21/2018   Lab Results  Component Value Date   TRIG 189 (H) 11/21/2018   Lab Results  Component Value Date   CHOLHDL 5.0 (H) 11/21/2018   Lab Results  Component Value Date   HGBA1C 6.5 (H) 12/31/2019  Assessment & Plan:   Problem List Items Addressed This Visit   None Visit Diagnoses     CKD (chronic kidney disease) stage 4, GFR 15-29 ml/min (HCC)    -  Primary   S/P hysterectomy       Relevant Orders   Ambulatory referral to Gynecology   Screen for colon cancer       Relevant Orders   Ambulatory referral to Gastroenterology       No orders of the defined types were placed in this encounter.   Follow-up: Return in about 9 weeks (around 03/20/2021) for 2 mo or so for shingles shot #2 - nurse visit.   PLAN Shingles and flu vaccine given today Return in 2 mo for second shingles shot Refer to GI for colonoscopy Refer to Gyn for clearance Mammogram ordered Patient encouraged to call clinic with any questions, comments, or concerns.  Maximiano Coss, NP

## 2021-01-22 DIAGNOSIS — N186 End stage renal disease: Secondary | ICD-10-CM | POA: Diagnosis not present

## 2021-01-31 NOTE — Addendum Note (Signed)
Addended by: Maximiano Coss on: 01/31/2021 07:21 AM   Modules accepted: Level of Service

## 2021-02-04 DIAGNOSIS — M3214 Glomerular disease in systemic lupus erythematosus: Secondary | ICD-10-CM | POA: Diagnosis not present

## 2021-02-04 DIAGNOSIS — M329 Systemic lupus erythematosus, unspecified: Secondary | ICD-10-CM | POA: Diagnosis not present

## 2021-02-04 DIAGNOSIS — N185 Chronic kidney disease, stage 5: Secondary | ICD-10-CM | POA: Diagnosis not present

## 2021-02-19 DIAGNOSIS — E872 Acidosis, unspecified: Secondary | ICD-10-CM | POA: Diagnosis not present

## 2021-02-19 DIAGNOSIS — D649 Anemia, unspecified: Secondary | ICD-10-CM | POA: Diagnosis not present

## 2021-02-19 DIAGNOSIS — M329 Systemic lupus erythematosus, unspecified: Secondary | ICD-10-CM | POA: Diagnosis not present

## 2021-02-19 DIAGNOSIS — I120 Hypertensive chronic kidney disease with stage 5 chronic kidney disease or end stage renal disease: Secondary | ICD-10-CM | POA: Diagnosis not present

## 2021-02-19 DIAGNOSIS — M109 Gout, unspecified: Secondary | ICD-10-CM | POA: Diagnosis not present

## 2021-02-19 DIAGNOSIS — N2581 Secondary hyperparathyroidism of renal origin: Secondary | ICD-10-CM | POA: Diagnosis not present

## 2021-02-19 DIAGNOSIS — N185 Chronic kidney disease, stage 5: Secondary | ICD-10-CM | POA: Diagnosis not present

## 2021-02-19 DIAGNOSIS — R7302 Impaired glucose tolerance (oral): Secondary | ICD-10-CM | POA: Diagnosis not present

## 2021-03-03 ENCOUNTER — Inpatient Hospital Stay (HOSPITAL_COMMUNITY)
Admission: EM | Admit: 2021-03-03 | Discharge: 2021-03-07 | DRG: 194 | Disposition: A | Discharge status: Home / Self Care | Payer: Medicare HMO | Attending: Internal Medicine | Admitting: Internal Medicine

## 2021-03-03 ENCOUNTER — Emergency Department (HOSPITAL_COMMUNITY): Payer: Medicare HMO

## 2021-03-03 DIAGNOSIS — M3214 Glomerular disease in systemic lupus erythematosus: Secondary | ICD-10-CM | POA: Diagnosis not present

## 2021-03-03 DIAGNOSIS — D649 Anemia, unspecified: Secondary | ICD-10-CM | POA: Diagnosis not present

## 2021-03-03 DIAGNOSIS — R7989 Other specified abnormal findings of blood chemistry: Secondary | ICD-10-CM | POA: Diagnosis present

## 2021-03-03 DIAGNOSIS — E8809 Other disorders of plasma-protein metabolism, not elsewhere classified: Secondary | ICD-10-CM | POA: Diagnosis not present

## 2021-03-03 DIAGNOSIS — Z881 Allergy status to other antibiotic agents status: Secondary | ICD-10-CM | POA: Diagnosis not present

## 2021-03-03 DIAGNOSIS — I10 Essential (primary) hypertension: Secondary | ICD-10-CM | POA: Diagnosis not present

## 2021-03-03 DIAGNOSIS — D509 Iron deficiency anemia, unspecified: Secondary | ICD-10-CM | POA: Diagnosis not present

## 2021-03-03 DIAGNOSIS — M1990 Unspecified osteoarthritis, unspecified site: Secondary | ICD-10-CM | POA: Diagnosis present

## 2021-03-03 DIAGNOSIS — R6510 Systemic inflammatory response syndrome (SIRS) of non-infectious origin without acute organ dysfunction: Secondary | ICD-10-CM | POA: Diagnosis present

## 2021-03-03 DIAGNOSIS — Z8249 Family history of ischemic heart disease and other diseases of the circulatory system: Secondary | ICD-10-CM

## 2021-03-03 DIAGNOSIS — Z83438 Family history of other disorder of lipoprotein metabolism and other lipidemia: Secondary | ICD-10-CM

## 2021-03-03 DIAGNOSIS — R778 Other specified abnormalities of plasma proteins: Secondary | ICD-10-CM | POA: Diagnosis not present

## 2021-03-03 DIAGNOSIS — R079 Chest pain, unspecified: Secondary | ICD-10-CM | POA: Diagnosis not present

## 2021-03-03 DIAGNOSIS — R000 Tachycardia, unspecified: Secondary | ICD-10-CM | POA: Diagnosis not present

## 2021-03-03 DIAGNOSIS — Z7901 Long term (current) use of anticoagulants: Secondary | ICD-10-CM | POA: Diagnosis not present

## 2021-03-03 DIAGNOSIS — M329 Systemic lupus erythematosus, unspecified: Secondary | ICD-10-CM | POA: Diagnosis present

## 2021-03-03 DIAGNOSIS — R509 Fever, unspecified: Secondary | ICD-10-CM | POA: Diagnosis not present

## 2021-03-03 DIAGNOSIS — I5030 Unspecified diastolic (congestive) heart failure: Secondary | ICD-10-CM | POA: Diagnosis not present

## 2021-03-03 DIAGNOSIS — J189 Pneumonia, unspecified organism: Principal | ICD-10-CM | POA: Diagnosis present

## 2021-03-03 DIAGNOSIS — Z20822 Contact with and (suspected) exposure to covid-19: Secondary | ICD-10-CM | POA: Diagnosis not present

## 2021-03-03 DIAGNOSIS — I252 Old myocardial infarction: Secondary | ICD-10-CM

## 2021-03-03 DIAGNOSIS — E782 Mixed hyperlipidemia: Secondary | ICD-10-CM | POA: Diagnosis present

## 2021-03-03 DIAGNOSIS — N185 Chronic kidney disease, stage 5: Secondary | ICD-10-CM | POA: Diagnosis present

## 2021-03-03 DIAGNOSIS — R6520 Severe sepsis without septic shock: Secondary | ICD-10-CM | POA: Diagnosis not present

## 2021-03-03 DIAGNOSIS — J168 Pneumonia due to other specified infectious organisms: Secondary | ICD-10-CM | POA: Diagnosis not present

## 2021-03-03 DIAGNOSIS — I5032 Chronic diastolic (congestive) heart failure: Secondary | ICD-10-CM | POA: Diagnosis present

## 2021-03-03 DIAGNOSIS — R109 Unspecified abdominal pain: Secondary | ICD-10-CM | POA: Diagnosis not present

## 2021-03-03 DIAGNOSIS — K219 Gastro-esophageal reflux disease without esophagitis: Secondary | ICD-10-CM | POA: Diagnosis present

## 2021-03-03 DIAGNOSIS — M109 Gout, unspecified: Secondary | ICD-10-CM | POA: Diagnosis present

## 2021-03-03 DIAGNOSIS — A419 Sepsis, unspecified organism: Secondary | ICD-10-CM

## 2021-03-03 DIAGNOSIS — I132 Hypertensive heart and chronic kidney disease with heart failure and with stage 5 chronic kidney disease, or end stage renal disease: Secondary | ICD-10-CM | POA: Diagnosis not present

## 2021-03-03 DIAGNOSIS — I480 Paroxysmal atrial fibrillation: Secondary | ICD-10-CM | POA: Diagnosis present

## 2021-03-03 DIAGNOSIS — I2510 Atherosclerotic heart disease of native coronary artery without angina pectoris: Secondary | ICD-10-CM | POA: Diagnosis not present

## 2021-03-03 DIAGNOSIS — E872 Acidosis, unspecified: Secondary | ICD-10-CM | POA: Diagnosis not present

## 2021-03-03 DIAGNOSIS — R0602 Shortness of breath: Secondary | ICD-10-CM | POA: Diagnosis not present

## 2021-03-03 DIAGNOSIS — Z79899 Other long term (current) drug therapy: Secondary | ICD-10-CM | POA: Diagnosis not present

## 2021-03-03 DIAGNOSIS — Z90710 Acquired absence of both cervix and uterus: Secondary | ICD-10-CM | POA: Diagnosis not present

## 2021-03-03 DIAGNOSIS — I700 Atherosclerosis of aorta: Secondary | ICD-10-CM | POA: Diagnosis not present

## 2021-03-03 DIAGNOSIS — N179 Acute kidney failure, unspecified: Secondary | ICD-10-CM | POA: Diagnosis present

## 2021-03-03 DIAGNOSIS — I517 Cardiomegaly: Secondary | ICD-10-CM | POA: Diagnosis not present

## 2021-03-03 DIAGNOSIS — R112 Nausea with vomiting, unspecified: Secondary | ICD-10-CM

## 2021-03-03 LAB — COMPREHENSIVE METABOLIC PANEL
ALT: 14 U/L (ref 0–44)
AST: 17 U/L (ref 15–41)
Albumin: 3 g/dL — ABNORMAL LOW (ref 3.5–5.0)
Alkaline Phosphatase: 98 U/L (ref 38–126)
Anion gap: 11 (ref 5–15)
BUN: 38 mg/dL — ABNORMAL HIGH (ref 8–23)
CO2: 20 mmol/L — ABNORMAL LOW (ref 22–32)
Calcium: 9.1 mg/dL (ref 8.9–10.3)
Chloride: 105 mmol/L (ref 98–111)
Creatinine, Ser: 4.55 mg/dL — ABNORMAL HIGH (ref 0.44–1.00)
GFR, Estimated: 10 mL/min — ABNORMAL LOW (ref >60)
Glucose, Bld: 120 mg/dL — ABNORMAL HIGH (ref 70–99)
Potassium: 4.3 mmol/L (ref 3.5–5.1)
Sodium: 136 mmol/L (ref 135–145)
Total Bilirubin: 0.6 mg/dL (ref 0.3–1.2)
Total Protein: 7.6 g/dL (ref 6.5–8.1)

## 2021-03-03 LAB — CBC WITH DIFFERENTIAL/PLATELET
Abs Immature Granulocytes: 0.16 10*3/uL — ABNORMAL HIGH (ref 0.00–0.07)
Basophils Absolute: 0.1 10*3/uL (ref 0.0–0.1)
Basophils Relative: 0 %
Eosinophils Absolute: 0 10*3/uL (ref 0.0–0.5)
Eosinophils Relative: 0 %
HCT: 31.6 % — ABNORMAL LOW (ref 36.0–46.0)
Hemoglobin: 9.8 g/dL — ABNORMAL LOW (ref 12.0–15.0)
Immature Granulocytes: 1 %
Lymphocytes Relative: 7 %
Lymphs Abs: 1.5 10*3/uL (ref 0.7–4.0)
MCH: 27.2 pg (ref 26.0–34.0)
MCHC: 31 g/dL (ref 30.0–36.0)
MCV: 87.8 fL (ref 80.0–100.0)
Monocytes Absolute: 1.6 10*3/uL — ABNORMAL HIGH (ref 0.1–1.0)
Monocytes Relative: 8 %
Neutro Abs: 18 10*3/uL — ABNORMAL HIGH (ref 1.7–7.7)
Neutrophils Relative %: 84 %
Platelets: 304 10*3/uL (ref 150–400)
RBC: 3.6 MIL/uL — ABNORMAL LOW (ref 3.87–5.11)
RDW: 15.1 % (ref 11.5–15.5)
WBC: 21.3 10*3/uL — ABNORMAL HIGH (ref 4.0–10.5)
nRBC: 0 % (ref 0.0–0.2)

## 2021-03-03 LAB — URINALYSIS, ROUTINE W REFLEX MICROSCOPIC
Bacteria, UA: NONE SEEN
Bilirubin Urine: NEGATIVE
Glucose, UA: NEGATIVE mg/dL
Hgb urine dipstick: NEGATIVE
Ketones, ur: NEGATIVE mg/dL
Leukocytes,Ua: NEGATIVE
Nitrite: NEGATIVE
Protein, ur: >=300 mg/dL — AB
Specific Gravity, Urine: 1.012 (ref 1.005–1.030)
pH: 6 (ref 5.0–8.0)

## 2021-03-03 LAB — LIPASE, BLOOD: Lipase: 24 U/L (ref 11–51)

## 2021-03-03 LAB — RESP PANEL BY RT-PCR (FLU A&B, COVID) ARPGX2
Influenza A by PCR: NEGATIVE
Influenza B by PCR: NEGATIVE
SARS Coronavirus 2 by RT PCR: NEGATIVE

## 2021-03-03 LAB — BRAIN NATRIURETIC PEPTIDE: B Natriuretic Peptide: 157.5 pg/mL — ABNORMAL HIGH (ref 0.0–100.0)

## 2021-03-03 LAB — LACTIC ACID, PLASMA: Lactic Acid, Venous: 1.4 mmol/L (ref 0.5–1.9)

## 2021-03-03 LAB — TROPONIN I (HIGH SENSITIVITY): Troponin I (High Sensitivity): 69 ng/L — ABNORMAL HIGH (ref <18)

## 2021-03-03 MED ORDER — SODIUM CHLORIDE 0.9 % IV SOLN: INTRAVENOUS | Status: DC

## 2021-03-03 MED ORDER — LACTATED RINGERS IV SOLN: INTRAVENOUS | Status: DC

## 2021-03-03 MED ORDER — METRONIDAZOLE 500 MG/100ML IV SOLN
500.0000 mg | Freq: Once | INTRAVENOUS | Status: AC
  Administered 2021-03-04: 500 mg via INTRAVENOUS
  Filled 2021-03-03: qty 100

## 2021-03-03 MED ORDER — LACTATED RINGERS IV BOLUS (SEPSIS)
1000.0000 mL | Freq: Once | INTRAVENOUS | Status: AC
  Administered 2021-03-04: 1000 mL via INTRAVENOUS

## 2021-03-03 MED ORDER — SODIUM CHLORIDE 0.9 % IV SOLN
2.0000 g | Freq: Once | INTRAVENOUS | Status: AC
  Administered 2021-03-04: 2 g via INTRAVENOUS
  Filled 2021-03-03: qty 2

## 2021-03-03 MED ORDER — ONDANSETRON HCL 4 MG/2ML IJ SOLN
4.0000 mg | Freq: Once | INTRAMUSCULAR | Status: AC
  Administered 2021-03-04: 4 mg via INTRAVENOUS
  Filled 2021-03-03: qty 2

## 2021-03-03 MED ORDER — ACETAMINOPHEN 325 MG PO TABS
650.0000 mg | ORAL_TABLET | Freq: Once | ORAL | Status: AC
Start: 2021-03-03 — End: 2021-03-03
  Administered 2021-03-03: 650 mg via ORAL
  Filled 2021-03-03: qty 2

## 2021-03-03 MED ORDER — VANCOMYCIN HCL 1500 MG/300ML IV SOLN
1500.0000 mg | Freq: Once | INTRAVENOUS | Status: AC
  Administered 2021-03-04: 1500 mg via INTRAVENOUS
  Filled 2021-03-03: qty 300

## 2021-03-03 MED ORDER — ONDANSETRON 4 MG PO TBDP
4.0000 mg | ORAL_TABLET | Freq: Once | ORAL | Status: AC | PRN
  Administered 2021-03-03: 4 mg via ORAL
  Filled 2021-03-03: qty 1

## 2021-03-03 NOTE — ED Triage Notes (Signed)
Pt c/o emesis, CP, cough & SHOB x 3 days. Pt reports vomiting "down to bile," denies diarrhea, unable to tolerate PO "except pepsi." Hx CHF, CKD, hematochezia, HTN  Pt febrile (102.4) in triage

## 2021-03-03 NOTE — ED Provider Notes (Signed)
Emergency Medicine Provider Triage Evaluation Note  Brittany Charles , a 70 y.o. female  was evaluated in triage.  Pt complains of chest pain/SOB for 3 days.  Has been vomited, now dry heaving.  No diarrhea.  She has only been drinking pepsi, no solids will stay down.  Febrile in triage to 102F, denies fever at home but has not checked.  Review of Systems  Positive: Cough, chest pain, SOB, vomiting Negative: Diarrhea, abodminal   Physical Exam  BP (!) 163/76   Pulse (!) 105   Temp (!) 102.4 F (39.1 C)   Resp 16   SpO2 99%  Gen:   Awake, no distress, appears unwell Resp:  Normal effort  MSK:   Moves extremities without difficulty  Other:   Medical Decision Making  Medically screening exam initiated at 10:06 PM.  Appropriate orders placed.  [name] was informed that the remainder of the evaluation will be completed by another provider, this initial triage assessment does not replace that evaluation, and the importance of remaining in the ED until their evaluation is complete.  Chest pain, cough, SOB.  Febrile, tachycardic.  Sepsis work-up initiated-- labs, cultures, CXR.  Given tylenol and zofran.    Made acuity 2, will prioritize room assignment.   Larene Pickett, PA-C 03/03/21 2219    Elnora Morrison, MD 03/04/21 Benancio Deeds

## 2021-03-04 ENCOUNTER — Emergency Department (HOSPITAL_COMMUNITY): Payer: Medicare HMO

## 2021-03-04 ENCOUNTER — Other Ambulatory Visit: Payer: Self-pay

## 2021-03-04 ENCOUNTER — Encounter (HOSPITAL_COMMUNITY): Payer: Self-pay | Admitting: Internal Medicine

## 2021-03-04 DIAGNOSIS — Z83438 Family history of other disorder of lipoprotein metabolism and other lipidemia: Secondary | ICD-10-CM | POA: Diagnosis not present

## 2021-03-04 DIAGNOSIS — R0602 Shortness of breath: Secondary | ICD-10-CM | POA: Diagnosis present

## 2021-03-04 DIAGNOSIS — J189 Pneumonia, unspecified organism: Principal | ICD-10-CM

## 2021-03-04 DIAGNOSIS — Z20822 Contact with and (suspected) exposure to covid-19: Secondary | ICD-10-CM | POA: Diagnosis present

## 2021-03-04 DIAGNOSIS — N179 Acute kidney failure, unspecified: Secondary | ICD-10-CM | POA: Diagnosis present

## 2021-03-04 DIAGNOSIS — I5032 Chronic diastolic (congestive) heart failure: Secondary | ICD-10-CM | POA: Diagnosis present

## 2021-03-04 DIAGNOSIS — I2510 Atherosclerotic heart disease of native coronary artery without angina pectoris: Secondary | ICD-10-CM | POA: Diagnosis present

## 2021-03-04 DIAGNOSIS — E782 Mixed hyperlipidemia: Secondary | ICD-10-CM

## 2021-03-04 DIAGNOSIS — N185 Chronic kidney disease, stage 5: Secondary | ICD-10-CM | POA: Diagnosis present

## 2021-03-04 DIAGNOSIS — Z881 Allergy status to other antibiotic agents status: Secondary | ICD-10-CM | POA: Diagnosis not present

## 2021-03-04 DIAGNOSIS — R778 Other specified abnormalities of plasma proteins: Secondary | ICD-10-CM

## 2021-03-04 DIAGNOSIS — M109 Gout, unspecified: Secondary | ICD-10-CM | POA: Diagnosis present

## 2021-03-04 DIAGNOSIS — M3214 Glomerular disease in systemic lupus erythematosus: Secondary | ICD-10-CM | POA: Diagnosis present

## 2021-03-04 DIAGNOSIS — K219 Gastro-esophageal reflux disease without esophagitis: Secondary | ICD-10-CM | POA: Diagnosis present

## 2021-03-04 DIAGNOSIS — I10 Essential (primary) hypertension: Secondary | ICD-10-CM | POA: Diagnosis not present

## 2021-03-04 DIAGNOSIS — D509 Iron deficiency anemia, unspecified: Secondary | ICD-10-CM | POA: Diagnosis present

## 2021-03-04 DIAGNOSIS — R6510 Systemic inflammatory response syndrome (SIRS) of non-infectious origin without acute organ dysfunction: Secondary | ICD-10-CM

## 2021-03-04 DIAGNOSIS — Z8249 Family history of ischemic heart disease and other diseases of the circulatory system: Secondary | ICD-10-CM | POA: Diagnosis not present

## 2021-03-04 DIAGNOSIS — Z79899 Other long term (current) drug therapy: Secondary | ICD-10-CM | POA: Diagnosis not present

## 2021-03-04 DIAGNOSIS — Z90710 Acquired absence of both cervix and uterus: Secondary | ICD-10-CM | POA: Diagnosis not present

## 2021-03-04 DIAGNOSIS — Z7901 Long term (current) use of anticoagulants: Secondary | ICD-10-CM | POA: Diagnosis not present

## 2021-03-04 DIAGNOSIS — M1990 Unspecified osteoarthritis, unspecified site: Secondary | ICD-10-CM | POA: Diagnosis present

## 2021-03-04 DIAGNOSIS — I252 Old myocardial infarction: Secondary | ICD-10-CM | POA: Diagnosis not present

## 2021-03-04 DIAGNOSIS — I132 Hypertensive heart and chronic kidney disease with heart failure and with stage 5 chronic kidney disease, or end stage renal disease: Secondary | ICD-10-CM | POA: Diagnosis present

## 2021-03-04 DIAGNOSIS — I480 Paroxysmal atrial fibrillation: Secondary | ICD-10-CM | POA: Diagnosis present

## 2021-03-04 DIAGNOSIS — R7989 Other specified abnormal findings of blood chemistry: Secondary | ICD-10-CM | POA: Diagnosis present

## 2021-03-04 LAB — HIV ANTIBODY (ROUTINE TESTING W REFLEX): HIV Screen 4th Generation wRfx: NONREACTIVE

## 2021-03-04 LAB — CBC WITH DIFFERENTIAL/PLATELET
Abs Immature Granulocytes: 0.16 10*3/uL — ABNORMAL HIGH (ref 0.00–0.07)
Basophils Absolute: 0.1 10*3/uL (ref 0.0–0.1)
Basophils Relative: 0 %
Eosinophils Absolute: 0 10*3/uL (ref 0.0–0.5)
Eosinophils Relative: 0 %
HCT: 31.1 % — ABNORMAL LOW (ref 36.0–46.0)
Hemoglobin: 9.3 g/dL — ABNORMAL LOW (ref 12.0–15.0)
Immature Granulocytes: 1 %
Lymphocytes Relative: 7 %
Lymphs Abs: 1.5 10*3/uL (ref 0.7–4.0)
MCH: 27 pg (ref 26.0–34.0)
MCHC: 29.9 g/dL — ABNORMAL LOW (ref 30.0–36.0)
MCV: 90.1 fL (ref 80.0–100.0)
Monocytes Absolute: 1.8 10*3/uL — ABNORMAL HIGH (ref 0.1–1.0)
Monocytes Relative: 8 %
Neutro Abs: 17.3 10*3/uL — ABNORMAL HIGH (ref 1.7–7.7)
Neutrophils Relative %: 84 %
Platelets: 294 10*3/uL (ref 150–400)
RBC: 3.45 MIL/uL — ABNORMAL LOW (ref 3.87–5.11)
RDW: 15.3 % (ref 11.5–15.5)
WBC: 20.7 10*3/uL — ABNORMAL HIGH (ref 4.0–10.5)
nRBC: 0 % (ref 0.0–0.2)

## 2021-03-04 LAB — COMPREHENSIVE METABOLIC PANEL
ALT: 12 U/L (ref 0–44)
AST: 17 U/L (ref 15–41)
Albumin: 2.8 g/dL — ABNORMAL LOW (ref 3.5–5.0)
Alkaline Phosphatase: 95 U/L (ref 38–126)
Anion gap: 13 (ref 5–15)
BUN: 39 mg/dL — ABNORMAL HIGH (ref 8–23)
CO2: 16 mmol/L — ABNORMAL LOW (ref 22–32)
Calcium: 8.9 mg/dL (ref 8.9–10.3)
Chloride: 106 mmol/L (ref 98–111)
Creatinine, Ser: 4.82 mg/dL — ABNORMAL HIGH (ref 0.44–1.00)
GFR, Estimated: 9 mL/min — ABNORMAL LOW (ref >60)
Glucose, Bld: 117 mg/dL — ABNORMAL HIGH (ref 70–99)
Potassium: 4.2 mmol/L (ref 3.5–5.1)
Sodium: 135 mmol/L (ref 135–145)
Total Bilirubin: 0.6 mg/dL (ref 0.3–1.2)
Total Protein: 7.1 g/dL (ref 6.5–8.1)

## 2021-03-04 LAB — VITAMIN B12: Vitamin B-12: 262 pg/mL (ref 180–914)

## 2021-03-04 LAB — LIPID PANEL
Cholesterol: 93 mg/dL (ref 0–200)
HDL: 45 mg/dL (ref >40)
LDL Cholesterol: 37 mg/dL (ref 0–99)
Total CHOL/HDL Ratio: 2.1 RATIO
Triglycerides: 54 mg/dL (ref <150)
VLDL: 11 mg/dL (ref 0–40)

## 2021-03-04 LAB — TROPONIN I (HIGH SENSITIVITY)
Troponin I (High Sensitivity): 70 ng/L — ABNORMAL HIGH (ref <18)
Troponin I (High Sensitivity): 68 ng/L — ABNORMAL HIGH (ref <18)

## 2021-03-04 LAB — IRON AND TIBC
Iron: 7 ug/dL — ABNORMAL LOW (ref 28–170)
Saturation Ratios: 4 % — ABNORMAL LOW (ref 10.4–31.8)
TIBC: 158 ug/dL — ABNORMAL LOW (ref 250–450)
UIBC: 151 ug/dL

## 2021-03-04 LAB — FOLATE: Folate: 15.4 ng/mL (ref >5.9)

## 2021-03-04 LAB — MAGNESIUM: Magnesium: 2 mg/dL (ref 1.7–2.4)

## 2021-03-04 MED ORDER — ONDANSETRON HCL 4 MG/2ML IJ SOLN: 4.0000 mg | Freq: Four times a day (QID) | INTRAMUSCULAR | Status: DC | PRN

## 2021-03-04 MED ORDER — METOPROLOL SUCCINATE ER 25 MG PO TB24
25.0000 mg | ORAL_TABLET | Freq: Every day | ORAL | Status: DC
  Administered 2021-03-04 – 2021-03-07 (×4): 25 mg via ORAL
  Filled 2021-03-04 (×4): qty 1

## 2021-03-04 MED ORDER — PROCHLORPERAZINE EDISYLATE 10 MG/2ML IJ SOLN
5.0000 mg | Freq: Four times a day (QID) | INTRAMUSCULAR | Status: DC | PRN
  Filled 2021-03-04: qty 1

## 2021-03-04 MED ORDER — ALLOPURINOL 100 MG PO TABS
100.0000 mg | ORAL_TABLET | Freq: Every day | ORAL | Status: DC
  Administered 2021-03-04 – 2021-03-07 (×4): 100 mg via ORAL
  Filled 2021-03-04 (×4): qty 1

## 2021-03-04 MED ORDER — ACETAMINOPHEN 325 MG PO TABS
650.0000 mg | ORAL_TABLET | Freq: Four times a day (QID) | ORAL | Status: DC | PRN
  Administered 2021-03-04 – 2021-03-05 (×4): 650 mg via ORAL
  Filled 2021-03-04 (×3): qty 2

## 2021-03-04 MED ORDER — POLYETHYLENE GLYCOL 3350 17 G PO PACK: 17.0000 g | PACK | Freq: Every day | ORAL | Status: DC | PRN

## 2021-03-04 MED ORDER — ROSUVASTATIN CALCIUM 20 MG PO TABS
20.0000 mg | ORAL_TABLET | Freq: Every day | ORAL | Status: DC
  Administered 2021-03-04 – 2021-03-07 (×4): 20 mg via ORAL
  Filled 2021-03-04 (×4): qty 1

## 2021-03-04 MED ORDER — HYDRALAZINE HCL 20 MG/ML IJ SOLN: 10.0000 mg | Freq: Four times a day (QID) | INTRAMUSCULAR | Status: DC | PRN

## 2021-03-04 MED ORDER — CEFTRIAXONE SODIUM 2 G IJ SOLR
2.0000 g | INTRAMUSCULAR | Status: DC
  Administered 2021-03-04 – 2021-03-06 (×3): 2 g via INTRAVENOUS
  Filled 2021-03-04 (×3): qty 20

## 2021-03-04 MED ORDER — DILTIAZEM HCL ER COATED BEADS 180 MG PO CP24
180.0000 mg | ORAL_CAPSULE | Freq: Every day | ORAL | Status: DC
  Administered 2021-03-04 – 2021-03-07 (×4): 180 mg via ORAL
  Filled 2021-03-04 (×5): qty 1

## 2021-03-04 MED ORDER — ONDANSETRON HCL 4 MG PO TABS: 4.0000 mg | ORAL_TABLET | Freq: Four times a day (QID) | ORAL | Status: DC | PRN

## 2021-03-04 MED ORDER — ACETAMINOPHEN 650 MG RE SUPP: 650.0000 mg | Freq: Four times a day (QID) | RECTAL | Status: DC | PRN

## 2021-03-04 MED ORDER — APIXABAN 5 MG PO TABS
5.0000 mg | ORAL_TABLET | Freq: Two times a day (BID) | ORAL | Status: DC
  Administered 2021-03-04 – 2021-03-07 (×8): 5 mg via ORAL
  Filled 2021-03-04 (×8): qty 1

## 2021-03-04 MED ORDER — HYDRALAZINE HCL 50 MG PO TABS
50.0000 mg | ORAL_TABLET | Freq: Three times a day (TID) | ORAL | Status: DC
  Administered 2021-03-04 – 2021-03-07 (×9): 50 mg via ORAL
  Filled 2021-03-04 (×3): qty 1
  Filled 2021-03-04: qty 2
  Filled 2021-03-04 (×5): qty 1

## 2021-03-04 MED ORDER — ALBUTEROL SULFATE (2.5 MG/3ML) 0.083% IN NEBU: 2.5000 mg | INHALATION_SOLUTION | Freq: Four times a day (QID) | RESPIRATORY_TRACT | Status: DC | PRN

## 2021-03-04 MED ORDER — SODIUM CHLORIDE 0.9 % IV SOLN: INTRAVENOUS | Status: AC

## 2021-03-04 MED ORDER — SODIUM CHLORIDE 0.9 % IV SOLN
100.0000 mg | Freq: Two times a day (BID) | INTRAVENOUS | Status: DC
  Administered 2021-03-04 – 2021-03-06 (×4): 100 mg via INTRAVENOUS
  Filled 2021-03-04 (×6): qty 100

## 2021-03-04 NOTE — Progress Notes (Signed)
  INTERVAL PROGRESS NOTE    Brittany Charles- 70 y.o. female  LOS: 0 __________________________________________________________________  SUBJECTIVE: Admitted 03/03/2021 with cc of  Chief Complaint  Patient presents with   Shortness of Breath   Nausea   Emesis   Chest Pain  Endorses improvement in nausea and vomiting.  Respiratory status is improved as well.  Still endorsing some shortness of breath. Since admission, has remained on room air with O2 sats greater than 95%.  OBJECTIVE: Blood pressure (!) 149/64, pulse 99, temperature 99.7 F (37.6 C), temperature source Oral, resp. rate (!) 24, height 5\' 5"  (1.651 m), weight 89.8 kg, SpO2 97 %.  General: NAD, pleasant, able to participate in exam Cardiac: Quick cap refill Respiratory: CTAB, normal effort, No wheezes, rales or rhonchi Extremities: trace LE edema. Skin: warm and dry, no rashes noted Neuro: alert and oriented, no focal deficits Psych: Normal affect and mood  ASSESSMENT/PLAN:  I have reviewed the full H&P by Dr. Cyd Silence, and I agree with the assessment and plan as outlined therein. In addition: - Continue ceftriaxone and doxycycline -Continue maintenance IV fluids as patient has nausea and vomiting -Chronic conditions are stable on home medical regimen -Antiemetics as needed   Principal Problem:   Pneumonia of left lower lobe due to infectious organism Active Problems:   Essential hypertension   Lupus (systemic lupus erythematosus) (HCC)   CKD (chronic kidney disease) stage 5, GFR less than 15 ml/min (HCC)   Elevated troponin level not due myocardial infarction   SIRS (systemic inflammatory response syndrome) ([place])   Mixed hyperlipidemia   Coronary artery disease involving native coronary artery of native heart   Richarda Osmond, DO [facility] 03/04/2021, 7:23 AM    [email or web address] Available by Epic secure chat 7AM-7PM. If 7PM-7AM, please contact night-coverage   No Charge

## 2021-03-04 NOTE — Assessment & Plan Note (Signed)
.   Patient is currently chest pain free . Monitoring patient on telemetry . Continue home regimen of anticoagulation, lipid lowering therapy and AV nodal blocking therapy

## 2021-03-04 NOTE — Assessment & Plan Note (Signed)
.   Continuing home regimen of lipid lowering therapy.  

## 2021-03-04 NOTE — Assessment & Plan Note (Signed)
·   Slightly elevated serial troponins with flat trajectory of elevation °· Patient is chest pain-free °· Likely secondary to underlying illness, plaque rupture is unlikely °· Monitoring patient on telemetry ° ° ° ° ° ° °

## 2021-03-04 NOTE — Assessment & Plan Note (Addendum)
.   Known history of advanced renal disease secondary to systemic lupus erythematosus and lupus nephritis. . Strict intake and output monitoring . Creatinine near baseline.  Reviewing records from [facility] transplant clinic reveal that baseline creatinine is approximately 4.2 as of 09/2020. . Minimizing nephrotoxic agents as much as possible . Serial chemistries to monitor renal function and electrolytes

## 2021-03-04 NOTE — ED Provider Notes (Signed)
[facility] EMERGENCY DEPARTMENT Provider Note   CSN: [id number] Arrival date & time: 03/03/21  2026     History Chief Complaint  Patient presents with   Shortness of Breath   Nausea   Emesis   Chest Pain    Brittany Charles is a 70 y.o. female.  HPI     This is a 70 year old female with a history of CHF, hypertension, lupus, renal insufficiency who presents with chest pain, shortness of breath, nausea and vomiting.  Patient reports 3-day history of this.  She has reported multiple episodes of nonbilious, nonbloody emesis.  She also reports nonproductive cough with shortness of breath.  She has occasional chest discomfort when she coughs.  She has not had any diarrhea.  She reports that she is unable to keep anything down.  She is noted to be febrile in triage but states that she did not note fevers at home.  No known sick contacts or COVID exposures.  Not currently having any pain.  She has not taken anything for her symptoms at home.  Past Medical History:  Diagnosis Date   Arthritis    CHF (congestive heart failure) (HCC)    Fibroids    Hypertension    Lupus (systemic lupus erythematosus) ([place])    Myocardial infarction [facility])    Renal insufficiency     Patient Active Problem List   Diagnosis Date Noted   AKI (acute kidney injury) ([place]) 12/30/2019   Colitis    Hematochezia 01/11/2017   Fatigue 87/68/1157   Systolic CHF ([place]) 26/20/3559   CKD (chronic kidney disease), stage III ([place]) 04/22/2011   Hypertension 04/17/2011   Gout 04/17/2011   Lupus (systemic lupus erythematosus) ([place]) 04/17/2011    Past Surgical History:  Procedure Laterality Date   ABDOMINAL HYSTERECTOMY  10/27/2000   TAH, BSO   LEFT AND RIGHT HEART CATHETERIZATION WITH CORONARY ANGIOGRAM Right 04/21/2011   Procedure: LEFT AND RIGHT HEART CATHETERIZATION WITH CORONARY ANGIOGRAM;  Surgeon: [name], MD;  Location: [facility] CATH LAB;  Service: Cardiovascular;  Laterality: Right;    TONSILLECTOMY       OB History   No obstetric history on file.     Family History  Problem Relation Age of Onset   Hypertension Mother    Heart disease Mother        CHF   Stroke Mother    Hyperlipidemia Father    Stroke Father    Hypertension Father    Stroke Maternal Grandmother    Colon cancer Unknown    Breast cancer Unknown     Social History   Tobacco Use   Smoking status: Never   Smokeless tobacco: Never  Vaping Use   Vaping Use: Never used  Substance Use Topics   Alcohol use: No   Drug use: No    Home Medications Prior to Admission medications   Medication Sig Start Date End Date Taking? Authorizing Provider  acetaminophen (TYLENOL) 325 MG tablet Take 650 mg by mouth as needed for mild pain or headache.     [provider]  albuterol (VENTOLIN HFA) 108 (90 Base) MCG/ACT inhaler Inhale 2 puffs into the lungs every 6 (six) hours as needed for wheezing or shortness of breath. 01/04/20   Rai, Ripudeep K, MD  allopurinol (ZYLOPRIM) 100 MG tablet TAKE 1 TABLET EVERY DAY 10/10/15   Posey Boyer, MD  apixaban (ELIQUIS) 5 MG TABS tablet Take 1 tablet (5 mg total) by mouth 2 (two) times daily. 12/10/20  Jerline Pain, MD  ascorbic acid (VITAMIN C) 500 MG tablet Take 1 tablet (500 mg total) by mouth daily. 01/05/20   Rai, Vernelle Emerald, MD  calcitRIOL (ROCALTROL) 0.25 MCG capsule Take 0.125 mcg by mouth daily. 12/15/19   [provider]  DILT-XR 180 MG 24 hr capsule  09/27/20   [provider]  doxazosin (CARDURA) 1 MG tablet  11/06/20   [provider]  hydrALAZINE (APRESOLINE) 50 MG tablet TAKE 1 TABLET THREE TIMES DAILY 10/10/15   Posey Boyer, MD  metoprolol succinate (TOPROL-XL) 25 MG 24 hr tablet Take 1 tablet (25 mg total) by mouth daily. Take with or immediately following a meal. 03/12/20   Kathyrn Drown D, NP  predniSONE (DELTASONE) 50 MG tablet as needed. 01/21/20   [provider]  rosuvastatin (CRESTOR) 20 MG tablet Take  1 tablet (20 mg total) by mouth daily. 12/10/20   Imogene Burn, PA-C  zinc sulfate 220 (50 Zn) MG capsule Take 1 capsule (220 mg total) by mouth daily. 01/05/20   Mendel Corning, MD    Allergies    Zithromax [azithromycin]  Review of Systems   Review of Systems  Constitutional:  Positive for fever.  Respiratory:  Positive for cough and shortness of breath.   Cardiovascular:  Positive for chest pain.  Gastrointestinal:  Positive for abdominal pain, nausea and vomiting. Negative for diarrhea.  Genitourinary:  Negative for dysuria.  All other systems reviewed and are negative.  Physical Exam Updated Vital Signs BP (!) 163/74   Pulse 89   Temp 99 F (37.2 C)   Resp 18   SpO2 91%   Physical Exam Vitals and nursing note reviewed.  Constitutional:      Appearance: She is well-developed. She is obese. She is ill-appearing. She is not toxic-appearing.  HENT:     Head: Normocephalic and atraumatic.     Mouth/Throat:     Pharynx: Oropharynx is clear.     Comments: Mucous membranes dry Eyes:     Pupils: Pupils are equal, round, and reactive to light.  Cardiovascular:     Rate and Rhythm: Normal rate and regular rhythm.     Heart sounds: Normal heart sounds.  Pulmonary:     Effort: Pulmonary effort is normal. No respiratory distress.     Breath sounds: No wheezing.  Abdominal:     General: Bowel sounds are normal.     Palpations: Abdomen is soft.     Tenderness: There is abdominal tenderness.     Comments: Diffuse tenderness to palpation, no rebound or guarding  Musculoskeletal:     Cervical back: Neck supple.  Skin:    General: Skin is warm and dry.  Neurological:     Mental Status: She is alert and oriented to person, place, and time.  Psychiatric:        Mood and Affect: Mood normal.    ED Results / Procedures / Treatments   Labs (all labs ordered are listed, but only abnormal results are displayed) Labs Reviewed  COMPREHENSIVE METABOLIC PANEL - Abnormal; Notable  for the following components:      Result Value   CO2 20 (*)    Glucose, Bld 120 (*)    BUN 38 (*)    Creatinine, Ser 4.55 (*)    Albumin 3.0 (*)    GFR, Estimated 10 (*)    All other components within normal limits  URINALYSIS, ROUTINE W REFLEX MICROSCOPIC - Abnormal; Notable for the following components:  APPearance HAZY (*)    Protein, ur >=300 (*)    All other components within normal limits  BRAIN NATRIURETIC PEPTIDE - Abnormal; Notable for the following components:   B Natriuretic Peptide 157.5 (*)    All other components within normal limits  CBC WITH DIFFERENTIAL/PLATELET - Abnormal; Notable for the following components:   WBC 21.3 (*)    RBC 3.60 (*)    Hemoglobin 9.8 (*)    HCT 31.6 (*)    Neutro Abs 18.0 (*)    Monocytes Absolute 1.6 (*)    Abs Immature Granulocytes 0.16 (*)    All other components within normal limits  TROPONIN I (HIGH SENSITIVITY) - Abnormal; Notable for the following components:   Troponin I (High Sensitivity) 69 (*)    All other components within normal limits  RESP PANEL BY RT-PCR (FLU A&B, COVID) ARPGX2  CULTURE, BLOOD (ROUTINE X 2)  CULTURE, BLOOD (ROUTINE X 2)  LIPASE, BLOOD  LACTIC ACID, PLASMA  TROPONIN I (HIGH SENSITIVITY)    EKG EKG Interpretation  Date/Time:  Monday March 03 2021 21:48:43 EDT Ventricular Rate:  104 PR Interval:  140 QRS Duration: 90 QT Interval:  344 QTC Calculation: 452 R Axis:   -29 Text Interpretation: Sinus tachycardia with Premature atrial complexes Anterior infarct , age undetermined Abnormal ECG Confirmed by Thayer Jew [phone]) on 03/03/2021 11:37:57 PM  Radiology CT ABDOMEN PELVIS WO CONTRAST  Result Date: 03/04/2021 CLINICAL DATA:  Abdominal pain and fever. EXAM: CT ABDOMEN AND PELVIS WITHOUT CONTRAST TECHNIQUE: Multidetector CT imaging of the abdomen and pelvis was performed following the standard protocol without IV contrast. COMPARISON:  CT abdomen pelvis dated 01/11/2017. FINDINGS:  Evaluation of this exam is limited in the absence of intravenous contrast. Lower chest: Partially visualized left lung base consolidation most consistent with pneumonia. Aspiration is not excluded. Clinical correlation recommended. No intra-abdominal free air or free fluid. Hepatobiliary: No focal liver abnormality is seen. No gallstones, gallbladder wall thickening, or biliary dilatation. Pancreas: Unremarkable. No pancreatic ductal dilatation or surrounding inflammatory changes. Spleen: Normal in size without focal abnormality. Adrenals/Urinary Tract: The adrenal glands unremarkable. There is a 2.5 cm hypodense lesion in the upper pole of the left kidney which is not characterized on this noncontrast CT but likely a cyst. There is no hydronephrosis or nephrolithiasis on either side. The visualized ureters and urinary bladder appear unremarkable. Stomach/Bowel: There is no bowel obstruction or active inflammation. The appendix is normal. Vascular/Lymphatic: Advanced aortoiliac atherosclerotic disease. The IVC is unremarkable. No portal venous gas. There is no adenopathy. Reproductive: Hysterectomy.  No adnexal masses Other: The none Musculoskeletal: Degenerative changes of the spine. No acute osseous pathology. IMPRESSION: 1. No acute intra-abdominal or pelvic pathology. No hydronephrosis or nephrolithiasis. 2. Partially visualized left lung base consolidation most consistent with pneumonia. Aspiration is not excluded. 3. Aortic Atherosclerosis (ICD10-I70.0). Electronically Signed   By: Anner Crete M.D.   On: 03/04/2021 00:49   DG Chest Port 1 View  Result Date: 03/03/2021 CLINICAL DATA:  Fever and chest pain. EXAM: PORTABLE CHEST 1 VIEW COMPARISON:  Chest x-ray 12/30/2019. FINDINGS: Heart is enlarged, unchanged. There is some strandy and patchy opacities in the retrocardiac region. There is no pleural effusion or pneumothorax. No acute fractures are seen. IMPRESSION: Minimal retrocardiac opacities may  represent atelectasis or infection. Stable cardiomegaly. Electronically Signed   By: Ronney Asters M.D.   On: 03/03/2021 22:13    Procedures .Critical Care Performed by: Merryl Hacker, MD Authorized by: Merryl Hacker,  MD   Critical care provider statement:    Critical care time (minutes):  65   Critical care was necessary to treat or prevent imminent or life-threatening deterioration of the following conditions:  Sepsis and metabolic crisis   Critical care was time spent personally by me on the following activities:  Discussions with consultants, evaluation of patient's response to treatment, development of treatment plan with patient or surrogate, ordering and performing treatments and interventions, ordering and review of laboratory studies, ordering and review of radiographic studies and re-evaluation of patient's condition   Medications Ordered in ED Medications  0.9 %  sodium chloride infusion (has no administration in time range)  lactated ringers infusion (has no administration in time range)  lactated ringers bolus 1,000 mL (1,000 mLs Intravenous New Bag/Given 03/04/21 0101)  ceFEPIme (MAXIPIME) 2 g in sodium chloride 0.9 % 100 mL IVPB (2 g Intravenous New Bag/Given 03/04/21 0058)  metroNIDAZOLE (FLAGYL) IVPB 500 mg (has no administration in time range)  vancomycin (VANCOREADY) IVPB 1500 mg/300 mL (has no administration in time range)  ondansetron (ZOFRAN-ODT) disintegrating tablet 4 mg (4 mg Oral Given 03/03/21 2153)  acetaminophen (TYLENOL) tablet 650 mg (650 mg Oral Given 03/03/21 2153)  ondansetron (ZOFRAN) injection 4 mg (4 mg Intravenous Given 03/04/21 0057)    ED Course  I have reviewed the triage vital signs and the nursing notes.  Pertinent labs & imaging results that were available during my care of the patient were reviewed by me and considered in my medical decision making (see chart for details).    MDM Rules/Calculators/A&P                            Patient presents with shortness of breath, chest discomfort, nausea, vomiting.  She is nontoxic.  Vital signs notable for temperature of 1-2.4.  She is mildly tachycardic.  Not hypotensive.  Sepsis work-up was initiated from triage.  EKG without acute ischemic changes.  Work-up notable for likely left lower lobe consolidation.  She has a significant leukocytosis to 21.  Renal failure with a creatinine of 4.5.  Baseline is 3.0.  She reports that she is still having urine output.  Patient was given broad-spectrum antibiotics.  Suspect her fever and septic picture is likely related to pneumonia; however, with the abdominal discomfort and nausea and vomiting, will obtain CT scan to rule out other etiology.  For this reason, patient was given broad-spectrum antibiotics including vancomycin, cefepime, and Flagyl.  CT scan does not show any intra-abdominal pathology.  Suspect at least part of her renal failure is prerenal.  She is given a liter of fluids.  She has not been septic shock and has not been hypotensive.  Lactate is normal.  We will give judicious maintenance fluid following this.  We will plan for admission.  Of note, troponin is slightly elevated in the mid 60s.  Given her renal failure, suspect that this is likely related to this and not a true NSTEMI or ACS picture.  May also be demand related.  She will need serial troponins.   Final Clinical Impression(s) / ED Diagnoses Final diagnoses:  Severe sepsis ([place])  Acute renal failure, unspecified acute renal failure type ([place])  Community acquired pneumonia, unspecified laterality  Nausea and vomiting, unspecified vomiting type    Rx / DC Orders ED Discharge Orders     None        Dadrian Ballantine, Barbette Hair, MD 03/04/21 0105

## 2021-03-04 NOTE — ED Notes (Signed)
Pt just came back to room from CT. IVF and Abx were unable to be started until now. Obtaining IV access now

## 2021-03-04 NOTE — H&P (Signed)
History and Physical    Brittany Charles PYP:[id number] DOB: 05/26/1950 DOA: 03/03/2021  PCP: Wendie Agreste, MD  Patient coming from: Home   Chief Complaint:  Chief Complaint  Patient presents with   Shortness of Breath   Nausea   Emesis   Chest Pain     HPI:    70 year old female with past medical history of paroxysmal atrial fibrillation, diastolic congestive heart failure, hypertension, gastroesophageal reflux disease, lupus systemic erythematosus with lupus nephritis and chronic kidney disease stage V (baseline Cr 4.2, follows at [facility]), hyperlipidemia, coronary artery disease (nonobstructive per cath 2012 ) who presents to [facility] emergency department with complaints of shortness of breath, cough, chest discomfort nausea and vomiting.  Patient describes 3 to 4 days ago she began to develop cough with associated shortness of breath.  As patient's symptoms over the next several days shortness of breath became progressively more severe, worse with exertion and improved with rest.  Patient also began to develop bouts of intense nausea with frequent bouts of bilious yet nonbloody vomiting.  Patient's oral intake has been extremely poor as a result.  As patient's symptoms continue to worsen patient developed generalized weakness and bouts of chest discomfort that the patient describes as midsternal in location, sharp in quality and occurring with cough.    Due to progressively worsening symptoms patient eventually presented to [facility] emergency department for evaluation.  Upon evaluation in the emergency department patient was found to have multiple SIRS criteria with a substantial leukocytosis of 21.3, tachycardia and fever of 102.4 F.  Patient was additionally found to have a creatinine of 4.55, up from her baseline of approximately 4.2 (obtained from [facility] records).  Troponin was noted to be 69 no evidence of dynamic ST segment changes on EKG.  Chest x-ray  revealed left lower lobe infiltrate and CT imaging of the abdomen pelvis revealed no intra-abdominal pathology but did confirm a left lower lobe pneumonia.  Patient was initiated on broad-spectrum intravenous antibiotic therapy due to initial concerns for developing sepsis with vancomycin cefepime and Flagyl.  The hospitalist group was then called to assess the patient for admission to the hospital.    Review of Systems:   Review of Systems  Respiratory:  Positive for cough and shortness of breath.   Cardiovascular:  Positive for chest pain.  Gastrointestinal:  Positive for nausea and vomiting.  All other systems reviewed and are negative.  Past Medical History:  Diagnosis Date   Arthritis    CHF (congestive heart failure) (HCC)    Fibroids    Hypertension    Lupus (systemic lupus erythematosus) ([place])    Myocardial infarction [facility])    Renal insufficiency     Past Surgical History:  Procedure Laterality Date   ABDOMINAL HYSTERECTOMY  10/27/2000   TAH, BSO   LEFT AND RIGHT HEART CATHETERIZATION WITH CORONARY ANGIOGRAM Right 04/21/2011   Procedure: LEFT AND RIGHT HEART CATHETERIZATION WITH CORONARY ANGIOGRAM;  Surgeon: [name], MD;  Location: [facility] CATH LAB;  Service: Cardiovascular;  Laterality: Right;   TONSILLECTOMY       reports that she has never smoked. She has never used smokeless tobacco. She reports that she does not drink alcohol and does not use drugs.  Allergies  Allergen Reactions   Zithromax [Azithromycin] Nausea And Vomiting    Family History  Problem Relation Age of Onset   Hypertension Mother    Heart disease Mother        CHF  Stroke Mother    Hyperlipidemia Father    Stroke Father    Hypertension Father    Stroke Maternal Grandmother    Colon cancer Unknown    Breast cancer Unknown      Prior to Admission medications   Medication Sig Start Date End Date Taking? Authorizing Provider  acetaminophen (TYLENOL) 325 MG tablet Take 650 mg by mouth  as needed for mild pain or headache.     [provider]  albuterol (VENTOLIN HFA) 108 (90 Base) MCG/ACT inhaler Inhale 2 puffs into the lungs every 6 (six) hours as needed for wheezing or shortness of breath. 01/04/20   Rai, Ripudeep K, MD  allopurinol (ZYLOPRIM) 100 MG tablet TAKE 1 TABLET EVERY DAY 10/10/15   Posey Boyer, MD  apixaban (ELIQUIS) 5 MG TABS tablet Take 1 tablet (5 mg total) by mouth 2 (two) times daily. 12/10/20   Jerline Pain, MD  ascorbic acid (VITAMIN C) 500 MG tablet Take 1 tablet (500 mg total) by mouth daily. 01/05/20   Rai, Vernelle Emerald, MD  calcitRIOL (ROCALTROL) 0.25 MCG capsule Take 0.125 mcg by mouth daily. 12/15/19   [provider]  DILT-XR 180 MG 24 hr capsule  09/27/20   [provider]  doxazosin (CARDURA) 1 MG tablet  11/06/20   [provider]  hydrALAZINE (APRESOLINE) 50 MG tablet TAKE 1 TABLET THREE TIMES DAILY 10/10/15   Posey Boyer, MD  metoprolol succinate (TOPROL-XL) 25 MG 24 hr tablet Take 1 tablet (25 mg total) by mouth daily. Take with or immediately following a meal. 03/12/20   Kathyrn Drown D, NP  predniSONE (DELTASONE) 50 MG tablet as needed. 01/21/20   [provider]  rosuvastatin (CRESTOR) 20 MG tablet Take 1 tablet (20 mg total) by mouth daily. 12/10/20   Imogene Burn, PA-C  zinc sulfate 220 (50 Zn) MG capsule Take 1 capsule (220 mg total) by mouth daily. 01/05/20   Mendel Corning, MD    Physical Exam: Vitals:   03/04/21 0255 03/04/21 0300 03/04/21 0400 03/04/21 0544  BP: (!) 164/73 (!) 164/68 (!) 139/52   Pulse:  85 92   Resp:  18 17   Temp:    (!) 100.9 F (38.3 C)  TempSrc:    Oral  SpO2:  99% 97%   Weight:      Height:        Constitutional: Awake alert and oriented x3, no associated distress.   Skin: no rashes, no lesions, good skin turgor noted. Eyes: Pupils are equally reactive to light.  No evidence of scleral icterus or conjunctival pallor.  ENMT: Moist mucous membranes noted.   Posterior pharynx clear of any exudate or lesions.   Neck: normal, supple, no masses, no thyromegaly.  No evidence of jugular venous distension.   Respiratory: Scattered rhonchi bilaterally with left basilar rales noted.  no significant wheezing noted normal respiratory effort. No accessory muscle use.  Cardiovascular: Regular rate and rhythm, no murmurs / rubs / gallops. No extremity edema. 2+ pedal pulses. No carotid bruits.  Chest:   Nontender without crepitus or deformity.   Back:   Nontender without crepitus or deformity. Abdomen: Abdomen is soft and nontender.  No evidence of intra-abdominal masses.  Positive bowel sounds noted in all quadrants.   Musculoskeletal: No joint deformity upper and lower extremities. Good ROM, no contractures. Normal muscle tone.  Neurologic: CN 2-12 grossly intact. Sensation intact.  Patient moving all 4 extremities spontaneously.  Patient is following  all commands.  Patient is responsive to verbal stimuli.   Psychiatric: Patient exhibits normal mood with appropriate affect.  Patient seems to possess insight as to their current situation.     Labs on Admission: I have personally reviewed following labs and imaging studies -   CBC: Recent Labs  Lab 03/03/21 2219 03/04/21 0013  WBC 21.3* 20.7*  NEUTROABS 18.0* 17.3*  HGB 9.8* 9.3*  HCT 31.6* 31.1*  MCV 87.8 90.1  PLT 304 734   Basic Metabolic Panel: Recent Labs  Lab 03/03/21 2158 03/04/21 0013  NA 136 135  K 4.3 4.2  CL 105 106  CO2 20* 16*  GLUCOSE 120* 117*  BUN 38* 39*  CREATININE 4.55* 4.82*  CALCIUM 9.1 8.9  MG  --  2.0   GFR: Estimated Creatinine Clearance: 12.2 mL/min (A) (by C-G formula based on SCr of 4.82 mg/dL (H)). Liver Function Tests: Recent Labs  Lab 03/03/21 2158 03/04/21 0013  AST 17 17  ALT 14 12  ALKPHOS 98 95  BILITOT 0.6 0.6  PROT 7.6 7.1  ALBUMIN 3.0* 2.8*   Recent Labs  Lab 03/03/21 2158  LIPASE 24   No results for input(s): AMMONIA in the last 168  hours. Coagulation Profile: No results for input(s): INR, PROTIME in the last 168 hours. Cardiac Enzymes: No results for input(s): CKTOTAL, CKMB, CKMBINDEX, TROPONINI in the last 168 hours. BNP (last 3 results) No results for input(s): PROBNP in the last 8760 hours. HbA1C: No results for input(s): HGBA1C in the last 72 hours. CBG: No results for input(s): GLUCAP in the last 168 hours. Lipid Profile: Recent Labs    03/04/21 0013  CHOL 93  HDL 45  LDLCALC 37  TRIG 54  CHOLHDL 2.1   Thyroid Function Tests: No results for input(s): TSH, T4TOTAL, FREET4, T3FREE, THYROIDAB in the last 72 hours. Anemia Panel: No results for input(s): VITAMINB12, FOLATE, FERRITIN, TIBC, IRON, RETICCTPCT in the last 72 hours. Urine analysis:    Component Value Date/Time   COLORURINE YELLOW 03/03/2021 2149   APPEARANCEUR HAZY (A) 03/03/2021 2149   LABSPEC 1.012 03/03/2021 2149   PHURINE 6.0 03/03/2021 2149   GLUCOSEU NEGATIVE 03/03/2021 2149   HGBUR NEGATIVE 03/03/2021 2149   BILIRUBINUR NEGATIVE 03/03/2021 2149   BILIRUBINUR neg 09/12/2014 [place] 03/03/2021 2149   PROTEINUR >=300 (A) 03/03/2021 2149   UROBILINOGEN 0.2 09/12/2014 1612   UROBILINOGEN 0.2 10/01/2012 1845   NITRITE NEGATIVE 03/03/2021 2149   LEUKOCYTESUR NEGATIVE 03/03/2021 2149    Radiological Exams on Admission - Personally Reviewed: CT ABDOMEN PELVIS WO CONTRAST  Result Date: 03/04/2021 CLINICAL DATA:  Abdominal pain and fever. EXAM: CT ABDOMEN AND PELVIS WITHOUT CONTRAST TECHNIQUE: Multidetector CT imaging of the abdomen and pelvis was performed following the standard protocol without IV contrast. COMPARISON:  CT abdomen pelvis dated 01/11/2017. FINDINGS: Evaluation of this exam is limited in the absence of intravenous contrast. Lower chest: Partially visualized left lung base consolidation most consistent with pneumonia. Aspiration is not excluded. Clinical correlation recommended. No intra-abdominal free  air or free fluid. Hepatobiliary: No focal liver abnormality is seen. No gallstones, gallbladder wall thickening, or biliary dilatation. Pancreas: Unremarkable. No pancreatic ductal dilatation or surrounding inflammatory changes. Spleen: Normal in size without focal abnormality. Adrenals/Urinary Tract: The adrenal glands unremarkable. There is a 2.5 cm hypodense lesion in the upper pole of the left kidney which is not characterized on this noncontrast CT but likely a cyst. There is no hydronephrosis or nephrolithiasis on either  side. The visualized ureters and urinary bladder appear unremarkable. Stomach/Bowel: There is no bowel obstruction or active inflammation. The appendix is normal. Vascular/Lymphatic: Advanced aortoiliac atherosclerotic disease. The IVC is unremarkable. No portal venous gas. There is no adenopathy. Reproductive: Hysterectomy.  No adnexal masses Other: The none Musculoskeletal: Degenerative changes of the spine. No acute osseous pathology. IMPRESSION: 1. No acute intra-abdominal or pelvic pathology. No hydronephrosis or nephrolithiasis. 2. Partially visualized left lung base consolidation most consistent with pneumonia. Aspiration is not excluded. 3. Aortic Atherosclerosis (ICD10-I70.0). Electronically Signed   By: Anner Crete M.D.   On: 03/04/2021 00:49   DG Chest Port 1 View  Result Date: 03/03/2021 CLINICAL DATA:  Fever and chest pain. EXAM: PORTABLE CHEST 1 VIEW COMPARISON:  Chest x-ray 12/30/2019. FINDINGS: Heart is enlarged, unchanged. There is some strandy and patchy opacities in the retrocardiac region. There is no pleural effusion or pneumothorax. No acute fractures are seen. IMPRESSION: Minimal retrocardiac opacities may represent atelectasis or infection. Stable cardiomegaly. Electronically Signed   By: Ronney Asters M.D.   On: 03/03/2021 22:13    EKG: Personally reviewed.  Rhythm is sinus tachycardia with heart rate of 104 bpm.  Multiple PACs noted.  No dynamic ST  segment changes appreciated.  Assessment/Plan  * Pneumonia of left lower lobe due to infectious organism With a history of shortness of breath, nonproductive cough, nausea vomiting and weakness Chest imaging revealing evidence of left lower lobe pneumonia Patient additionally exhibiting multiple SIRS criteria including fever, tachycardia and leukocytosis without organ dysfunction so sepsis not present Will transition patient from intravenous cefepime Flagyl and vancomycin to ceftriaxone and doxycycline Gentle intravenous hydration throughout the evening Blood cultures have been obtained Supplemental oxygen as necessary to keep oxygen saturations between 92 to 96%   SIRS (systemic inflammatory response syndrome) (HCC) Patient exhibiting multiple SIRS criteria including leukocytosis, fever and tachycardia No evidence of organ dysfunction to diagnose concurrent sepsis Treating associated pneumonia as noted above   CKD (chronic kidney disease) stage 5, GFR less than 15 ml/min (HCC) Known history of advanced renal disease secondary to systemic lupus erythematosus and lupus nephritis. Strict intake and output monitoring Creatinine near baseline.  Reviewing records from [facility] clinic reveal that baseline creatinine is approximately 4.2 as of 09/2020. Minimizing nephrotoxic agents as much as possible Serial chemistries to monitor renal function and electrolytes   Elevated troponin level not due myocardial infarction Slightly elevated serial troponins with flat trajectory of elevation Patient is chest pain-free Likely secondary to underlying illness, plaque rupture is unlikely Monitoring patient on telemetry   Essential hypertension Resume patients home regimen of oral antihypertensives Titrate antihypertensive regimen as necessary to achieve adequate BP control PRN intravenous antihypertensives for excessively elevated blood pressure    Lupus (systemic lupus erythematosus)  ([place]) Complicated by presence of lupus nephritis. Outpatient follow-up  Coronary artery disease involving native coronary artery of native heart Patient is currently chest pain free Monitoring patient on telemetry Continue home regimen of anticoagulation, lipid lowering therapy and AV nodal blocking therapy   Mixed hyperlipidemia Continuing home regimen of lipid lowering therapy.      Code Status:  Full code  code status decision has been confirmed with: patient Family Communication: deferred   Status is: Inpatient  Remains inpatient appropriate because:   Treating patient for left lower lobe lung pneumonia requiring intravenous antibiotic therapy, frequent blood testing and close monitoring.        Vernelle Emerald MD [facility] Pager [phone]  If 7PM-7AM,  please contact night-coverage [email or web address] Use universal [place] password for that web site. If you do not have the password, please call the hospital operator.  03/04/2021, 6:03 AM

## 2021-03-04 NOTE — Assessment & Plan Note (Signed)
   Complicated by presence of lupus nephritis.  Outpatient follow-up

## 2021-03-04 NOTE — Assessment & Plan Note (Signed)
.   Resume patients home regimen of oral antihypertensives . Titrate antihypertensive regimen as necessary to achieve adequate BP control . PRN intravenous antihypertensives for excessively elevated blood pressure   

## 2021-03-04 NOTE — Progress Notes (Signed)
Pt. Arrived to floor via stretcher at 1840, A&O X4 , ambulated to bed, family bedside

## 2021-03-04 NOTE — ED Notes (Signed)
Called pharmacy to check on status of Doxy- was told they would look for it

## 2021-03-04 NOTE — Assessment & Plan Note (Addendum)
   Patient exhibiting multiple SIRS criteria including leukocytosis, fever and tachycardia  No evidence of organ dysfunction to diagnose concurrent sepsis  Treating associated pneumonia as noted above

## 2021-03-04 NOTE — ED Notes (Signed)
Patient resting on stretcher. No distress noted. Denies needs

## 2021-03-04 NOTE — Assessment & Plan Note (Signed)
   With a history of shortness of breath, nonproductive cough, nausea vomiting and weakness  Chest imaging revealing evidence of left lower lobe pneumonia  Patient additionally exhibiting multiple SIRS criteria including fever, tachycardia and leukocytosis without organ dysfunction so sepsis not present  Will transition patient from intravenous cefepime Flagyl and vancomycin to ceftriaxone and doxycycline  Gentle intravenous hydration throughout the evening  Blood cultures have been obtained  Supplemental oxygen as necessary to keep oxygen saturations between 92 to 96%

## 2021-03-05 DIAGNOSIS — I10 Essential (primary) hypertension: Secondary | ICD-10-CM

## 2021-03-05 DIAGNOSIS — R778 Other specified abnormalities of plasma proteins: Secondary | ICD-10-CM | POA: Diagnosis not present

## 2021-03-05 DIAGNOSIS — N179 Acute kidney failure, unspecified: Secondary | ICD-10-CM | POA: Diagnosis not present

## 2021-03-05 DIAGNOSIS — J189 Pneumonia, unspecified organism: Secondary | ICD-10-CM | POA: Diagnosis not present

## 2021-03-05 LAB — BASIC METABOLIC PANEL
Anion gap: 10 (ref 5–15)
BUN: 39 mg/dL — ABNORMAL HIGH (ref 8–23)
CO2: 17 mmol/L — ABNORMAL LOW (ref 22–32)
Calcium: 8.7 mg/dL — ABNORMAL LOW (ref 8.9–10.3)
Chloride: 112 mmol/L — ABNORMAL HIGH (ref 98–111)
Creatinine, Ser: 4.43 mg/dL — ABNORMAL HIGH (ref 0.44–1.00)
GFR, Estimated: 10 mL/min — ABNORMAL LOW (ref >60)
Glucose, Bld: 92 mg/dL (ref 70–99)
Potassium: 4.4 mmol/L (ref 3.5–5.1)
Sodium: 139 mmol/L (ref 135–145)

## 2021-03-05 LAB — CBC
HCT: 30.8 % — ABNORMAL LOW (ref 36.0–46.0)
Hemoglobin: 8.8 g/dL — ABNORMAL LOW (ref 12.0–15.0)
MCH: 26.8 pg (ref 26.0–34.0)
MCHC: 28.6 g/dL — ABNORMAL LOW (ref 30.0–36.0)
MCV: 93.9 fL (ref 80.0–100.0)
Platelets: 286 K/uL (ref 150–400)
RBC: 3.28 MIL/uL — ABNORMAL LOW (ref 3.87–5.11)
RDW: 15.6 % — ABNORMAL HIGH (ref 11.5–15.5)
WBC: 20 K/uL — ABNORMAL HIGH (ref 4.0–10.5)
nRBC: 0 % (ref 0.0–0.2)

## 2021-03-05 NOTE — Progress Notes (Signed)
[facility]  PROGRESS NOTE  Brittany Charles XBD:[id number] DOB: 1950-05-18 DOA: 03/03/2021 PCP: Wendie Agreste, MD   Brief HPI:   70 year old female with past medical history of paroxysmal atrial fibrillation, diastolic CHF, hypertension, GERD, lupus, lupus nephritis, CKD stage V, baseline creatinine 4.2, hyperlipidemia, CAD came to [facility], ED with complaints of shortness of breath, cough chest discomfort, nausea and vomiting. In the ED chest x-ray showed left lower lobe infiltrate and CT imaging of abdomen/pelvis revealed no intra-abdominal pathology but confirmed left lower lobe pneumonia.  Patient started on IV antibiotics.    Subjective   Patient seen and examined, she still spiking fever with T-max one 1.3 this morning.   Assessment/Plan:    Left lower lobe pneumonia -Confirmed on CT abdomen/pelvis -Initially started on vancomycin, cefepime and Flagyl -Antibiotics transitioned to ceftriaxone and doxycycline -Follow blood culture results -O2 sats 99% on room air, not requiring oxygen  CKD stage V -Known history of advanced renal disease due to SLE/lupus nephritis -Creatinine at baseline -Avoid nephrotoxic agents  Elevated troponin -Likely from underlying CKD, no chest pain -Troponin down trending -69, 70, 68   Hypertension -Continue home medications including metoprolol, hydralazine, Cardizem  Hyperlipidemia -Continue rosuvastatin  CAD -Patient is chest pain-free -Continue metoprolol, rosuvastatin  Paroxysmal atrial fibrillation -Normal sinus rhythm -Continue metoprolol, Cardizem -Continue anticoagulation with apixaban   Scheduled medications:    allopurinol  100 mg Oral Daily   apixaban  5 mg Oral BID   diltiazem  180 mg Oral Daily   hydrALAZINE  50 mg Oral Q8H   metoprolol succinate  25 mg Oral Daily   rosuvastatin  20 mg Oral Daily     Data Reviewed:   CBG:  No results for input(s): GLUCAP in the last 168 hours.  SpO2: 99 %     Vitals:   03/04/21 1905 03/04/21 2115 03/05/21 0503 03/05/21 1236  BP:  117/79 (!) 164/62 (!) 132/57  Pulse:  79 86 71  Resp:  18  18  Temp:  99.8 F (37.7 C) (!) 101.3 F (38.5 C) 98.3 F (36.8 C)  TempSrc:  Oral  Oral  SpO2: 94% 93% 97% 99%  Weight:      Height:         Intake/Output Summary (Last 24 hours) at 03/05/2021 1808 Last data filed at 03/05/2021 0529 Gross per 24 hour  Intake 598.71 ml  Output --  Net 598.71 ml    10/24 1901 - 10/26 0700 In: 2523.2 [I.V.:424.5] Out: -   Filed Weights   03/04/21 0106  Weight: 89.8 kg    Data Reviewed: Basic Metabolic Panel: Recent Labs  Lab 03/03/21 2158 03/04/21 0013 03/05/21 0254  NA 136 135 139  K 4.3 4.2 4.4  CL 105 106 112*  CO2 20* 16* 17*  GLUCOSE 120* 117* 92  BUN 38* 39* 39*  CREATININE 4.55* 4.82* 4.43*  CALCIUM 9.1 8.9 8.7*  MG  --  2.0  --    Liver Function Tests: Recent Labs  Lab 03/03/21 2158 03/04/21 0013  AST 17 17  ALT 14 12  ALKPHOS 98 95  BILITOT 0.6 0.6  PROT 7.6 7.1  ALBUMIN 3.0* 2.8*   Recent Labs  Lab 03/03/21 2158  LIPASE 24   No results for input(s): AMMONIA in the last 168 hours. CBC: Recent Labs  Lab 03/03/21 2219 03/04/21 0013 03/05/21 0254  WBC 21.3* 20.7* 20.0*  NEUTROABS 18.0* 17.3*  --   HGB 9.8* 9.3* 8.8*  HCT 31.6*  31.1* 30.8*  MCV 87.8 90.1 93.9  PLT 304 294 286   Cardiac Enzymes: No results for input(s): CKTOTAL, CKMB, CKMBINDEX, TROPONINI in the last 168 hours. BNP (last 3 results) Recent Labs    03/03/21 2219  BNP 157.5*    ProBNP (last 3 results) No results for input(s): PROBNP in the last 8760 hours.  CBG: No results for input(s): GLUCAP in the last 168 hours.     Radiology Reports  CT ABDOMEN PELVIS WO CONTRAST  Result Date: 03/04/2021 CLINICAL DATA:  Abdominal pain and fever. EXAM: CT ABDOMEN AND PELVIS WITHOUT CONTRAST TECHNIQUE: Multidetector CT imaging of the abdomen and pelvis was performed following the standard  protocol without IV contrast. COMPARISON:  CT abdomen pelvis dated 01/11/2017. FINDINGS: Evaluation of this exam is limited in the absence of intravenous contrast. Lower chest: Partially visualized left lung base consolidation most consistent with pneumonia. Aspiration is not excluded. Clinical correlation recommended. No intra-abdominal free air or free fluid. Hepatobiliary: No focal liver abnormality is seen. No gallstones, gallbladder wall thickening, or biliary dilatation. Pancreas: Unremarkable. No pancreatic ductal dilatation or surrounding inflammatory changes. Spleen: Normal in size without focal abnormality. Adrenals/Urinary Tract: The adrenal glands unremarkable. There is a 2.5 cm hypodense lesion in the upper pole of the left kidney which is not characterized on this noncontrast CT but likely a cyst. There is no hydronephrosis or nephrolithiasis on either side. The visualized ureters and urinary bladder appear unremarkable. Stomach/Bowel: There is no bowel obstruction or active inflammation. The appendix is normal. Vascular/Lymphatic: Advanced aortoiliac atherosclerotic disease. The IVC is unremarkable. No portal venous gas. There is no adenopathy. Reproductive: Hysterectomy.  No adnexal masses Other: The none Musculoskeletal: Degenerative changes of the spine. No acute osseous pathology. IMPRESSION: 1. No acute intra-abdominal or pelvic pathology. No hydronephrosis or nephrolithiasis. 2. Partially visualized left lung base consolidation most consistent with pneumonia. Aspiration is not excluded. 3. Aortic Atherosclerosis (ICD10-I70.0). Electronically Signed   By: Anner Crete M.D.   On: 03/04/2021 00:49   DG Chest Port 1 View  Result Date: 03/03/2021 CLINICAL DATA:  Fever and chest pain. EXAM: PORTABLE CHEST 1 VIEW COMPARISON:  Chest x-ray 12/30/2019. FINDINGS: Heart is enlarged, unchanged. There is some strandy and patchy opacities in the retrocardiac region. There is no pleural effusion or  pneumothorax. No acute fractures are seen. IMPRESSION: Minimal retrocardiac opacities may represent atelectasis or infection. Stable cardiomegaly. Electronically Signed   By: Ronney Asters M.D.   On: 03/03/2021 22:13       Antibiotics: Anti-infectives (From admission, onward)    Start     Dose/Rate Route Frequency Ordered Stop   03/04/21 2200  cefTRIAXone (ROCEPHIN) 2 g in sodium chloride 0.9 % 100 mL IVPB        2 g 200 mL/hr over 30 Minutes Intravenous Every 24 hours 03/04/21 0550     03/04/21 0800  doxycycline (VIBRAMYCIN) 100 mg in sodium chloride 0.9 % 250 mL IVPB        100 mg 125 mL/hr over 120 Minutes Intravenous Every 12 hours 03/04/21 0550     03/04/21 0000  ceFEPIme (MAXIPIME) 2 g in sodium chloride 0.9 % 100 mL IVPB        2 g 200 mL/hr over 30 Minutes Intravenous  Once 03/03/21 2356 03/04/21 0128   03/04/21 0000  metroNIDAZOLE (FLAGYL) IVPB 500 mg        500 mg 100 mL/hr over 60 Minutes Intravenous  Once 03/03/21 2356 03/04/21 0245   03/04/21 0000  vancomycin (VANCOREADY) IVPB 1500 mg/300 mL        1,500 mg 150 mL/hr over 120 Minutes Intravenous  Once 03/03/21 2356 03/04/21 0400         DVT prophylaxis: Apixaban  Code Status: Full code  Family Communication: No family at bedside   Consultants:   Procedures:     Objective    Physical Examination:   General-appears in no acute distress Heart-S1-S2, regular, no murmur auscultated Lungs-clear to auscultation bilaterally, no wheezing or crackles auscultated Abdomen-soft, nontender, no organomegaly Extremities-no edema in the lower extremities Neuro-alert, oriented x3, no focal deficit noted Status is: Inpatient  Dispo: The patient is from: Home              Anticipated d/c is to: Home              Anticipated d/c date is: 03/07/2021              Patient currently not stable for discharge  Barrier to discharge-ongoing management for pneumonia  COVID-19 Labs  No results for input(s): DDIMER,  FERRITIN, LDH, CRP in the last 72 hours.  Lab Results  Component Value Date   SARSCOV2NAA NEGATIVE 03/03/2021   SARSCOV2NAA POSITIVE (A) 12/30/2019            Recent Results (from the past 240 hour(s))  Blood culture (routine x 2)     Status: None (Preliminary result)   Collection Time: 03/03/21 10:00 PM   Specimen: BLOOD LEFT ARM  Result Value Ref Range Status   Specimen Description BLOOD LEFT ARM  Final   Special Requests   Final    BOTTLES DRAWN AEROBIC AND ANAEROBIC Blood Culture adequate volume   Culture   Final    NO GROWTH 2 DAYS Performed at [facility], [address], [place], [place] [address]    Report Status PENDING  Incomplete  Resp Panel by RT-PCR (Flu A&B, Covid) Nasopharyngeal Swab     Status: None   Collection Time: 03/03/21 10:05 PM   Specimen: Nasopharyngeal Swab; Nasopharyngeal(NP) swabs in vial transport medium  Result Value Ref Range Status   SARS Coronavirus 2 by RT PCR NEGATIVE NEGATIVE Final    Comment: (NOTE) SARS-CoV-2 target nucleic acids are NOT DETECTED.  The SARS-CoV-2 RNA is generally detectable in upper respiratory specimens during the acute phase of infection. The lowest concentration of SARS-CoV-2 viral copies this assay can detect is 138 copies/mL. A negative result does not preclude SARS-Cov-2 infection and should not be used as the sole basis for treatment or other patient management decisions. A negative result may occur with  improper specimen collection/handling, submission of specimen other than nasopharyngeal swab, presence of viral mutation(s) within the areas targeted by this assay, and inadequate number of viral copies(<138 copies/mL). A negative result must be combined with clinical observations, patient history, and epidemiological information. The expected result is Negative.  Fact Sheet for Patients:  EntrepreneurPulse.com.au  Fact Sheet for Healthcare Providers:   IncredibleEmployment.be  This test is no t yet approved or cleared by the [place] FDA and  has been authorized for detection and/or diagnosis of SARS-CoV-2 by FDA under an Emergency Use Authorization (EUA). This EUA will remain  in effect (meaning this test can be used) for the duration of the COVID-19 declaration under Section 564(b)(1) of the Act, 21 U.S.C.section 360bbb-3(b)(1), unless the authorization is terminated  or revoked sooner.       Influenza A by PCR NEGATIVE NEGATIVE Final  Influenza B by PCR NEGATIVE NEGATIVE Final    Comment: (NOTE) The Xpert Xpress SARS-CoV-2/FLU/RSV plus assay is intended as an aid in the diagnosis of influenza from Nasopharyngeal swab specimens and should not be used as a sole basis for treatment. Nasal washings and aspirates are unacceptable for Xpert Xpress SARS-CoV-2/FLU/RSV testing.  Fact Sheet for Patients: EntrepreneurPulse.com.au  Fact Sheet for Healthcare Providers: IncredibleEmployment.be  This test is not yet approved or cleared by the [place] FDA and has been authorized for detection and/or diagnosis of SARS-CoV-2 by FDA under an Emergency Use Authorization (EUA). This EUA will remain in effect (meaning this test can be used) for the duration of the COVID-19 declaration under Section 564(b)(1) of the Act, 21 U.S.C. section 360bbb-3(b)(1), unless the authorization is terminated or revoked.  Performed at [facility], [place] [address] [place], [place] [address]   Blood culture (routine x 2)     Status: None (Preliminary result)   Collection Time: 03/03/21 10:21 PM   Specimen: BLOOD RIGHT ARM  Result Value Ref Range Status   Specimen Description BLOOD RIGHT ARM  Final   Special Requests   Final    BOTTLES DRAWN AEROBIC ONLY Blood Culture results may not be optimal due to an excessive volume of blood received in culture bottles   Culture   Final    NO  GROWTH 2 DAYS Performed at [facility], [place] [address] [place],  [address]    Report Status PENDING  Incomplete    Oswald Hillock   [facility] If 7PM-7AM, please contact night-coverage at [email or web address], Office  [phone]   03/05/2021, 6:08 PM  LOS: 1 day

## 2021-03-06 DIAGNOSIS — J189 Pneumonia, unspecified organism: Secondary | ICD-10-CM | POA: Diagnosis not present

## 2021-03-06 DIAGNOSIS — I10 Essential (primary) hypertension: Secondary | ICD-10-CM | POA: Diagnosis not present

## 2021-03-06 DIAGNOSIS — N179 Acute kidney failure, unspecified: Secondary | ICD-10-CM | POA: Diagnosis not present

## 2021-03-06 DIAGNOSIS — R778 Other specified abnormalities of plasma proteins: Secondary | ICD-10-CM | POA: Diagnosis not present

## 2021-03-06 LAB — CBC
HCT: 27.1 % — ABNORMAL LOW (ref 36.0–46.0)
Hemoglobin: 8.2 g/dL — ABNORMAL LOW (ref 12.0–15.0)
MCH: 26.5 pg (ref 26.0–34.0)
MCHC: 30.3 g/dL (ref 30.0–36.0)
MCV: 87.7 fL (ref 80.0–100.0)
Platelets: 334 10*3/uL (ref 150–400)
RBC: 3.09 MIL/uL — ABNORMAL LOW (ref 3.87–5.11)
RDW: 15.6 % — ABNORMAL HIGH (ref 11.5–15.5)
WBC: 14.1 10*3/uL — ABNORMAL HIGH (ref 4.0–10.5)
nRBC: 0 % (ref 0.0–0.2)

## 2021-03-06 LAB — BASIC METABOLIC PANEL
Anion gap: 7 (ref 5–15)
BUN: 40 mg/dL — ABNORMAL HIGH (ref 8–23)
CO2: 17 mmol/L — ABNORMAL LOW (ref 22–32)
Calcium: 8.6 mg/dL — ABNORMAL LOW (ref 8.9–10.3)
Chloride: 114 mmol/L — ABNORMAL HIGH (ref 98–111)
Creatinine, Ser: 4.59 mg/dL — ABNORMAL HIGH (ref 0.44–1.00)
GFR, Estimated: 10 mL/min — ABNORMAL LOW (ref >60)
Glucose, Bld: 136 mg/dL — ABNORMAL HIGH (ref 70–99)
Potassium: 3.7 mmol/L (ref 3.5–5.1)
Sodium: 138 mmol/L (ref 135–145)

## 2021-03-06 MED ORDER — DARBEPOETIN ALFA 200 MCG/0.4ML IJ SOSY
200.0000 ug | PREFILLED_SYRINGE | INTRAMUSCULAR | Status: DC
  Administered 2021-03-06: 200 ug via SUBCUTANEOUS
  Filled 2021-03-06: qty 0.4

## 2021-03-06 MED ORDER — DOXYCYCLINE HYCLATE 100 MG PO TABS
100.0000 mg | ORAL_TABLET | Freq: Two times a day (BID) | ORAL | Status: DC
  Administered 2021-03-06 – 2021-03-07 (×3): 100 mg via ORAL
  Filled 2021-03-06 (×3): qty 1

## 2021-03-06 MED ORDER — SODIUM CHLORIDE 0.9 % IV SOLN
510.0000 mg | Freq: Once | INTRAVENOUS | Status: AC
  Administered 2021-03-06: 510 mg via INTRAVENOUS
  Filled 2021-03-06: qty 17

## 2021-03-06 NOTE — Consult Note (Signed)
[facility] Renal Consultation Note  Requesting MD: [place] Indication for Consultation: progressive CKD  HPI:  Brittany Charles is a 70 y.o. female with past medical history significant for lupus, and remote lupus nephritis requiring treatment-fairly advanced CKD stage IV/V followed by Dr. Johnney Ou at [facility] with creatinine on 01/07/21 of 3.95.  Patient has initiated the transplant evaluation and has also been educated regarding dialysis planning.  She also has diastolic heart failure, hypertension, paroxysmal atrial fibrillation, hyperlipidemia and CAD.  She presented to the emergency department on 10/24 with complaints of shortness of breath, cough with CT imaging showing left lower lobe pneumonia.  She was admitted and started on antibiotics.  Presenting creatinine was 4.5 and it is also 4.5 today.  I am asked to see her because kidney function seem different from baseline but in fact it is not much.  Other labs of note, albumin 2.8, hemoglobin 8.2 with iron saturation of 4.  She tells me that overall she feels much better than when she came in.  She had appetite disturbance initially but that is gone now, no nausea and ate well today.  Feels like her strength is improving as well.  Does admit to feeling cold  Creat  Date/Time Value Ref Range Status  09/13/2014 11:08 AM 2.41 (H) 0.50 - 1.10 mg/dL Final  09/12/2014 03:47 PM 2.06 (H) 0.50 - 1.10 mg/dL Final  10/17/2012 04:58 PM 2.90 (H) 0.50 - 1.10 mg/dL Final   Creatinine, Ser  Date/Time Value Ref Range Status  03/06/2021 02:10 AM 4.59 (H) 0.44 - 1.00 mg/dL Final  03/05/2021 02:54 AM 4.43 (H) 0.44 - 1.00 mg/dL Final  03/04/2021 12:13 AM 4.82 (H) 0.44 - 1.00 mg/dL Final  03/03/2021 09:58 PM 4.55 (H) 0.44 - 1.00 mg/dL Final  02/01/2020 08:52 AM 2.49 (H) 0.57 - 1.00 mg/dL Final  01/04/2020 12:27 AM 2.92 (H) 0.44 - 1.00 mg/dL Final  01/03/2020 01:20 AM 3.03 (H) 0.44 - 1.00 mg/dL Final  01/02/2020 04:25 AM 3.05  (H) 0.44 - 1.00 mg/dL Final  01/01/2020 07:30 PM 3.09 (H) 0.44 - 1.00 mg/dL Final  12/31/2019 05:23 AM 4.17 (H) 0.44 - 1.00 mg/dL Final  12/30/2019 12:17 PM 5.77 (H) 0.44 - 1.00 mg/dL Final  11/21/2018 12:00 PM 2.89 (H) 0.57 - 1.00 mg/dL Final  01/14/2017 04:59 AM 1.99 (H) 0.44 - 1.00 mg/dL Final  01/13/2017 05:35 AM 2.19 (H) 0.44 - 1.00 mg/dL Final  01/12/2017 04:45 AM 2.24 (H) 0.44 - 1.00 mg/dL Final  01/11/2017 09:02 AM 2.78 (H) 0.44 - 1.00 mg/dL Final  06/13/2013 12:31 AM 2.70 (H) 0.50 - 1.10 mg/dL Final  10/04/2012 05:22 AM 2.34 (H) 0.50 - 1.10 mg/dL Final  10/03/2012 04:30 AM 2.23 (H) 0.50 - 1.10 mg/dL Final  10/02/2012 05:58 AM 2.18 (H) 0.50 - 1.10 mg/dL Final  10/01/2012 05:50 PM 2.35 (H) 0.50 - 1.10 mg/dL Final  04/22/2011 06:25 AM 1.84 (H) 0.50 - 1.10 mg/dL Final  04/21/2011 05:30 AM 2.08 (H) 0.50 - 1.10 mg/dL Final  04/20/2011 05:00 AM 2.21 (H) 0.50 - 1.10 mg/dL Final  04/19/2011 05:30 AM 2.37 (H) 0.50 - 1.10 mg/dL Final  04/18/2011 05:00 AM 2.06 (H) 0.50 - 1.10 mg/dL Final  04/17/2011 07:00 AM 1.85 (H) 0.50 - 1.10 mg/dL Final  04/17/2011 03:35 AM 1.74 (H) 0.50 - 1.10 mg/dL Final  04/17/2011 01:03 AM 1.90 (H) 0.50 - 1.10 mg/dL Final  01/29/2011 07:00 AM 1.90 (H) 0.50 - 1.10 mg/dL Final  08/08/2010 04:50 AM 1.90 (H) 0.4 -  1.2 mg/dL Final  08/07/2010 04:55 AM 2.04 (H) 0.4 - 1.2 mg/dL Final  08/06/2010 05:12 AM 2.37 (H) 0.4 - 1.2 mg/dL Final  08/05/2010 06:43 AM 2.53 (H) 0.4 - 1.2 mg/dL Final  08/04/2010 03:44 PM 2.53 (H) 0.4 - 1.2 mg/dL Final  08/03/2010 03:45 PM 2.37 (H) 0.4 - 1.2 mg/dL Final     PMHx:   Past Medical History:  Diagnosis Date   Arthritis    CHF (congestive heart failure) (HCC)    Fibroids    Hypertension    Lupus (systemic lupus erythematosus) ([place])    Myocardial infarction [facility])    Renal insufficiency     Past Surgical History:  Procedure Laterality Date   ABDOMINAL HYSTERECTOMY  10/27/2000   TAH, BSO   LEFT AND RIGHT HEART CATHETERIZATION  WITH CORONARY ANGIOGRAM Right 04/21/2011   Procedure: LEFT AND RIGHT HEART CATHETERIZATION WITH CORONARY ANGIOGRAM;  Surgeon: [name], MD;  Location: [facility] CATH LAB;  Service: Cardiovascular;  Laterality: Right;   TONSILLECTOMY      Family Hx:  Family History  Problem Relation Age of Onset   Hypertension Mother    Heart disease Mother        CHF   Stroke Mother    Hyperlipidemia Father    Stroke Father    Hypertension Father    Stroke Maternal Grandmother    Colon cancer Unknown    Breast cancer Unknown     Social History:  reports that she has never smoked. She has never used smokeless tobacco. She reports that she does not drink alcohol and does not use drugs.  Allergies:  Allergies  Allergen Reactions   Zithromax [Azithromycin] Nausea And Vomiting    Medications: Prior to Admission medications   Medication Sig Start Date End Date Taking? Authorizing Provider  albuterol (VENTOLIN HFA) 108 (90 Base) MCG/ACT inhaler Inhale 2 puffs into the lungs every 6 (six) hours as needed for wheezing or shortness of breath. 01/04/20  Yes Rai, Ripudeep K, MD  allopurinol (ZYLOPRIM) 100 MG tablet TAKE 1 TABLET EVERY DAY Patient taking differently: Take 100 mg by mouth daily. 10/10/15  Yes Posey Boyer, MD  apixaban (ELIQUIS) 5 MG TABS tablet Take 1 tablet (5 mg total) by mouth 2 (two) times daily. 12/10/20  Yes Jerline Pain, MD  ascorbic acid (VITAMIN C) 500 MG tablet Take 1 tablet (500 mg total) by mouth daily. 01/05/20  Yes Rai, Ripudeep K, MD  calcitRIOL (ROCALTROL) 0.25 MCG capsule Take 0.25 mcg by mouth daily. 12/15/19  Yes [provider]  DILT-XR 180 MG 24 hr capsule Take 180 mg by mouth daily. 09/27/20  Yes [provider]  doxazosin (CARDURA) 1 MG tablet Take 1 mg by mouth daily. 11/06/20  Yes [provider]  hydrALAZINE (APRESOLINE) 50 MG tablet TAKE 1 TABLET THREE TIMES DAILY Patient taking differently: Take 50 mg by mouth 3 (three) times daily. 10/10/15   Yes Posey Boyer, MD  predniSONE (DELTASONE) 50 MG tablet Take 25 mg by mouth daily as needed (gout flare). 01/21/20  Yes [provider]  rosuvastatin (CRESTOR) 20 MG tablet Take 1 tablet (20 mg total) by mouth daily. 12/10/20  Yes Imogene Burn, PA-C  sodium bicarbonate 650 MG tablet Take 650 mg by mouth 2 (two) times daily. 02/18/21  Yes [provider]  zinc sulfate 220 (50 Zn) MG capsule Take 1 capsule (220 mg total) by mouth daily. 01/05/20  Yes Rai, Vernelle Emerald, MD    I  have reviewed the patient's current medications.  Labs:  Results for orders placed or performed during the hospital encounter of 03/03/21 (from the past 48 hour(s))  Folate     Status: None   Collection Time: 03/04/21  8:03 PM  Result Value Ref Range   Folate 15.4 >5.9 ng/mL    Comment: Performed at [facility], [place] [address], [place], [place] [address]  Iron and TIBC     Status: Abnormal   Collection Time: 03/04/21  8:03 PM  Result Value Ref Range   Iron 7 (L) 28 - 170 ug/dL   TIBC 158 (L) 250 - 450 ug/dL   Saturation Ratios 4 (L) 10.4 - 31.8 %   UIBC 151 ug/dL    Comment: Performed at [facility], [place] [address] [place], [place] [address]  Vitamin B12     Status: None   Collection Time: 03/04/21  8:03 PM  Result Value Ref Range   Vitamin B-12 262 180 - 914 pg/mL    Comment: Performed at [facility], [place] [address] [place], [place] [address]  CBC     Status: Abnormal   Collection Time: 03/05/21  2:54 AM  Result Value Ref Range   WBC 20.0 (H) 4.0 - 10.5 K/uL   RBC 3.28 (L) 3.87 - 5.11 MIL/uL   Hemoglobin 8.8 (L) 12.0 - 15.0 g/dL   HCT 30.8 (L) 36.0 - 46.0 %   MCV 93.9 80.0 - 100.0 fL   MCH 26.8 26.0 - 34.0 pg   MCHC 28.6 (L) 30.0 - 36.0 g/dL   RDW 15.6 (H) 11.5 - 15.5 %   Platelets 286 150 - 400 K/uL   nRBC 0.0 0.0 - 0.2 %    Comment: Performed at [place] [address], [place], [place] [address]  Basic metabolic panel     Status: Abnormal    Collection Time: 03/05/21  2:54 AM  Result Value Ref Range   Sodium 139 135 - 145 mmol/L   Potassium 4.4 3.5 - 5.1 mmol/L    Comment: SLIGHT HEMOLYSIS   Chloride 112 (H) 98 - 111 mmol/L   CO2 17 (L) 22 - 32 mmol/L   Glucose, Bld 92 70 - 99 mg/dL    Comment: Glucose reference range applies only to samples taken after fasting for at least 8 hours.   BUN 39 (H) 8 - 23 mg/dL   Creatinine, Ser 4.43 (H) 0.44 - 1.00 mg/dL   Calcium 8.7 (L) 8.9 - 10.3 mg/dL   GFR, Estimated 10 (L) >60 mL/min    Comment: (NOTE) Calculated using the CKD-EPI Creatinine Equation (2021)    Anion gap 10 5 - 15    Comment: Performed at [facility] [address], [place], [place] [address]  CBC     Status: Abnormal   Collection Time: 03/06/21  2:10 AM  Result Value Ref Range   WBC 14.1 (H) 4.0 - 10.5 K/uL   RBC 3.09 (L) 3.87 - 5.11 MIL/uL   Hemoglobin 8.2 (L) 12.0 - 15.0 g/dL   HCT 27.1 (L) 36.0 - 46.0 %   MCV 87.7 80.0 - 100.0 fL    Comment: REPEATED TO VERIFY   MCH 26.5 26.0 - 34.0 pg   MCHC 30.3 30.0 - 36.0 g/dL   RDW 15.6 (H) 11.5 - 15.5 %   Platelets 334 150 - 400 K/uL   nRBC 0.0 0.0 - 0.2 %    Comment: Performed at  [facility], [place] [address] [place], [place] [address]  Basic metabolic panel     Status: Abnormal   Collection Time: 03/06/21  2:10 AM  Result Value Ref Range   Sodium 138 135 - 145 mmol/L   Potassium 3.7 3.5 - 5.1 mmol/L   Chloride 114 (H) 98 - 111 mmol/L   CO2 17 (L) 22 - 32 mmol/L   Glucose, Bld 136 (H) 70 - 99 mg/dL    Comment: Glucose reference range applies only to samples taken after fasting for at least 8 hours.   BUN 40 (H) 8 - 23 mg/dL   Creatinine, Ser 4.59 (H) 0.44 - 1.00 mg/dL   Calcium 8.6 (L) 8.9 - 10.3 mg/dL   GFR, Estimated 10 (L) >60 mL/min    Comment: (NOTE) Calculated using the CKD-EPI Creatinine Equation (2021)    Anion gap 7 5 - 15    Comment: Performed at [facility] [address], [place], [place] [address]     ROS:  A  comprehensive review of systems was negative.  Physical Exam: Vitals:   03/06/21 0529 03/06/21 1357  BP: (!) 142/55 (!) 143/56  Pulse: 73 74  Resp: 15 16  Temp:  99 F (37.2 C)  SpO2: 99% 100%     General: Alert, oriented and in no acute distress HEENT: Pupils are equal round reactive to light, extract motions are intact, mucous membranes are moist  Neck: No JVD Heart: Regular rate and rhythm Lungs: Clear to auscultation bilaterally Abdomen: Obese, soft, nontender Extremities: Very minimal peripheral edema, obesity Skin: Warm and dry Neuro: Alert and nonfocal  Assessment/Plan: 70 year old black female with medical problems to include stage IV/V CKD secondary to lupus nephritis followed by [facility] as outpatient.  She has been admitted for symptomatic pneumonia but overall improved and feels ready for discharge 1.Renal-has had known progressive CKD thought secondary to lupus nephritis, being managed at [facility] by Dr. Johnney Ou.  Has initiated transplant evaluation and dialysis planning.  Creatinine and GFR is not significantly different from where it has been recently at [facility].  Because patient has improved clinically from her pneumonia.  She does not to my evaluation seem to have overt uremic symptomatology at this time.  Therefore, no indications to initiate dialysis in the short-term.  I feel like she is appropriate for discharge and to follow-up with her outpatient nephrologist 2. Hypertension/volume  -reasonable control at this time on Cardizem and Toprol as well as hydralazine.  Volume status is fine.  No changes recommended 3.  Hypoalbuminemia- albumin is low and not concerning but I think it is just in the setting of her acute illness.  Now that her appetite has improved I anticipate it will improve 4. Anemia  -seems more significant than what it has been in the outpatient setting.  Iron stores are low.  I would like to dose her with IV  iron and an ESA today 5. Dispo- from my standpoint would be okay for discharge on 10/28   Louis Meckel 03/06/2021, 2:18 PM

## 2021-03-06 NOTE — Progress Notes (Addendum)
[facility]  PROGRESS NOTE  Brittany Charles WPY:[id number] DOB: 01/10/1951 DOA: 03/03/2021 PCP: Wendie Agreste, MD   Brief HPI:   70 year old female with past medical history of paroxysmal atrial fibrillation, diastolic CHF, hypertension, GERD, lupus, lupus nephritis, CKD stage V, baseline creatinine 4.2, hyperlipidemia, CAD came to [facility], ED with complaints of shortness of breath, cough chest discomfort, nausea and vomiting. In the ED chest x-ray showed left lower lobe infiltrate and CT imaging of abdomen/pelvis revealed no intra-abdominal pathology but confirmed left lower lobe pneumonia.  Patient started on IV antibiotics.    Subjective   Patient seen and examined, denies shortness of breath.  Clinically much improved.  Afebrile overnight.   Assessment/Plan:    Left lower lobe pneumonia -Confirmed on CT abdomen/pelvis -Initially started on vancomycin, cefepime and Flagyl -Antibiotics transitioned to ceftriaxone and doxycycline -Follow blood culture results -WBC down to 14,000 -O2 sats 99% on room air, not requiring oxygen  CKD stage V -Known history of advanced renal disease due to SLE/lupus nephritis -Creatinine is elevated more than baseline -Avoid nephrotoxic agents -We will consult nephrology to see if patient needs any intervention  Elevated troponin -Likely from underlying CKD, no chest pain -Troponin down trending -69, 70, 68   Hypertension -Continue home medications including metoprolol, hydralazine, Cardizem  Hyperlipidemia -Continue rosuvastatin  CAD -Patient is chest pain-free -Continue metoprolol, rosuvastatin  Paroxysmal atrial fibrillation -Normal sinus rhythm -Continue metoprolol, Cardizem -Continue anticoagulation with apixaban  Iron-deficiency anemia -Serum iron 7, saturation 4% -IV Feraheme 510 mg x 1 given -Also started on Aranesp per nephrology   Scheduled medications:    allopurinol  100 mg Oral Daily   apixaban   5 mg Oral BID   darbepoetin (ARANESP) injection - NON-DIALYSIS  200 mcg Subcutaneous Q Thu-1800   diltiazem  180 mg Oral Daily   doxycycline  100 mg Oral Q12H   hydrALAZINE  50 mg Oral Q8H   metoprolol succinate  25 mg Oral Daily   rosuvastatin  20 mg Oral Daily     Data Reviewed:   CBG:  No results for input(s): GLUCAP in the last 168 hours.  SpO2: 100 %    Vitals:   03/05/21 1905 03/05/21 2035 03/06/21 0529 03/06/21 1357  BP:  (!) 155/58 (!) 142/55 (!) 143/56  Pulse:  79 73 74  Resp:  17 15 16   Temp:  99.7 F (37.6 C)  99 F (37.2 C)  TempSrc:  Oral    SpO2: 100% 99% 99% 100%  Weight:      Height:        No intake or output data in the 24 hours ending 03/06/21 1657   10/25 1901 - 10/27 0700 In: 598.7  Out: -   Filed Weights   03/04/21 0106  Weight: 89.8 kg    Data Reviewed: Basic Metabolic Panel: Recent Labs  Lab 03/03/21 2158 03/04/21 0013 03/05/21 0254 03/06/21 0210  NA 136 135 139 138  K 4.3 4.2 4.4 3.7  CL 105 106 112* 114*  CO2 20* 16* 17* 17*  GLUCOSE 120* 117* 92 136*  BUN 38* 39* 39* 40*  CREATININE 4.55* 4.82* 4.43* 4.59*  CALCIUM 9.1 8.9 8.7* 8.6*  MG  --  2.0  --   --    Liver Function Tests: Recent Labs  Lab 03/03/21 2158 03/04/21 0013  AST 17 17  ALT 14 12  ALKPHOS 98 95  BILITOT 0.6 0.6  PROT 7.6 7.1  ALBUMIN 3.0* 2.8*  Recent Labs  Lab 03/03/21 2158  LIPASE 24   No results for input(s): AMMONIA in the last 168 hours. CBC: Recent Labs  Lab 03/03/21 2219 03/04/21 0013 03/05/21 0254 03/06/21 0210  WBC 21.3* 20.7* 20.0* 14.1*  NEUTROABS 18.0* 17.3*  --   --   HGB 9.8* 9.3* 8.8* 8.2*  HCT 31.6* 31.1* 30.8* 27.1*  MCV 87.8 90.1 93.9 87.7  PLT 304 294 286 334   Cardiac Enzymes: No results for input(s): CKTOTAL, CKMB, CKMBINDEX, TROPONINI in the last 168 hours. BNP (last 3 results) Recent Labs    03/03/21 2219  BNP 157.5*    ProBNP (last 3 results) No results for input(s): PROBNP in the last 8760  hours.  CBG: No results for input(s): GLUCAP in the last 168 hours.     Radiology Reports  No results found.     Antibiotics: Anti-infectives (From admission, onward)    Start     Dose/Rate Route Frequency Ordered Stop   03/06/21 1000  doxycycline (VIBRA-TABS) tablet 100 mg        100 mg Oral Every 12 hours 03/06/21 0826     03/04/21 2200  cefTRIAXone (ROCEPHIN) 2 g in sodium chloride 0.9 % 100 mL IVPB        2 g 200 mL/hr over 30 Minutes Intravenous Every 24 hours 03/04/21 0550     03/04/21 0800  doxycycline (VIBRAMYCIN) 100 mg in sodium chloride 0.9 % 250 mL IVPB  Status:  Discontinued        100 mg 125 mL/hr over 120 Minutes Intravenous Every 12 hours 03/04/21 0550 03/06/21 0826   03/04/21 0000  ceFEPIme (MAXIPIME) 2 g in sodium chloride 0.9 % 100 mL IVPB        2 g 200 mL/hr over 30 Minutes Intravenous  Once 03/03/21 2356 03/04/21 0128   03/04/21 0000  metroNIDAZOLE (FLAGYL) IVPB 500 mg        500 mg 100 mL/hr over 60 Minutes Intravenous  Once 03/03/21 2356 03/04/21 0245   03/04/21 0000  vancomycin (VANCOREADY) IVPB 1500 mg/300 mL        1,500 mg 150 mL/hr over 120 Minutes Intravenous  Once 03/03/21 2356 03/04/21 0400         DVT prophylaxis: Apixaban  Code Status: Full code  Family Communication: No family at bedside   Consultants:   Procedures:     Objective    Physical Examination:  General-appears in no acute distress Heart-S1-S2, regular, no murmur auscultated Lungs-clear to auscultation bilaterally, no wheezing or crackles auscultated Abdomen-soft, nontender, no organomegaly Extremities-no edema in the lower extremities Neuro-alert, oriented x3, no focal deficit noted   Status is: Inpatient  Dispo: The patient is from: Home              Anticipated d/c is to: Home              Anticipated d/c date is: 03/07/2021              Patient currently not stable for discharge  Barrier to discharge-ongoing management for  pneumonia  COVID-19 Labs  No results for input(s): DDIMER, FERRITIN, LDH, CRP in the last 72 hours.  Lab Results  Component Value Date   SARSCOV2NAA NEGATIVE 03/03/2021   SARSCOV2NAA POSITIVE (A) 12/30/2019            Recent Results (from the past 240 hour(s))  Blood culture (routine x 2)     Status: None (Preliminary result)   Collection Time: 03/03/21  10:00 PM   Specimen: BLOOD LEFT ARM  Result Value Ref Range Status   Specimen Description BLOOD LEFT ARM  Final   Special Requests   Final    BOTTLES DRAWN AEROBIC AND ANAEROBIC Blood Culture adequate volume   Culture   Final    NO GROWTH 3 DAYS Performed at [facility], [address] [place], [place] [address]    Report Status PENDING  Incomplete  Resp Panel by RT-PCR (Flu A&B, Covid) Nasopharyngeal Swab     Status: None   Collection Time: 03/03/21 10:05 PM   Specimen: Nasopharyngeal Swab; Nasopharyngeal(NP) swabs in vial transport medium  Result Value Ref Range Status   SARS Coronavirus 2 by RT PCR NEGATIVE NEGATIVE Final    Comment: (NOTE) SARS-CoV-2 target nucleic acids are NOT DETECTED.  The SARS-CoV-2 RNA is generally detectable in upper respiratory specimens during the acute phase of infection. The lowest concentration of SARS-CoV-2 viral copies this assay can detect is 138 copies/mL. A negative result does not preclude SARS-Cov-2 infection and should not be used as the sole basis for treatment or other patient management decisions. A negative result may occur with  improper specimen collection/handling, submission of specimen other than nasopharyngeal swab, presence of viral mutation(s) within the areas targeted by this assay, and inadequate number of viral copies(<138 copies/mL). A negative result must be combined with clinical observations, patient history, and epidemiological information. The expected result is Negative.  Fact Sheet for Patients:   EntrepreneurPulse.com.au  Fact Sheet for Healthcare Providers:  IncredibleEmployment.be  This test is no t yet approved or cleared by the [place] FDA and  has been authorized for detection and/or diagnosis of SARS-CoV-2 by FDA under an Emergency Use Authorization (EUA). This EUA will remain  in effect (meaning this test can be used) for the duration of the COVID-19 declaration under Section 564(b)(1) of the Act, 21 U.S.C.section 360bbb-3(b)(1), unless the authorization is terminated  or revoked sooner.       Influenza A by PCR NEGATIVE NEGATIVE Final   Influenza B by PCR NEGATIVE NEGATIVE Final    Comment: (NOTE) The Xpert Xpress SARS-CoV-2/FLU/RSV plus assay is intended as an aid in the diagnosis of influenza from Nasopharyngeal swab specimens and should not be used as a sole basis for treatment. Nasal washings and aspirates are unacceptable for Xpert Xpress SARS-CoV-2/FLU/RSV testing.  Fact Sheet for Patients: EntrepreneurPulse.com.au  Fact Sheet for Healthcare Providers: IncredibleEmployment.be  This test is not yet approved or cleared by the [place] FDA and has been authorized for detection and/or diagnosis of SARS-CoV-2 by FDA under an Emergency Use Authorization (EUA). This EUA will remain in effect (meaning this test can be used) for the duration of the COVID-19 declaration under Section 564(b)(1) of the Act, 21 U.S.C. section 360bbb-3(b)(1), unless the authorization is terminated or revoked.  Performed at [facility], [place] [address] [place],  [address]   Blood culture (routine x 2)     Status: None (Preliminary result)   Collection Time: 03/03/21 10:21 PM   Specimen: BLOOD RIGHT ARM  Result Value Ref Range Status   Specimen Description BLOOD RIGHT ARM  Final   Special Requests   Final    BOTTLES DRAWN AEROBIC ONLY Blood Culture results may not be optimal due to an  excessive volume of blood received in culture bottles   Culture   Final    NO GROWTH 3 DAYS Performed at [facility], [place],  [place] [address]    Report Status PENDING  Incomplete    [facility] If 7PM-7AM, please contact night-coverage at [email or web address], Office  [phone]   03/06/2021, 4:57 PM  LOS: 2 days

## 2021-03-07 DIAGNOSIS — J189 Pneumonia, unspecified organism: Secondary | ICD-10-CM | POA: Diagnosis not present

## 2021-03-07 LAB — BASIC METABOLIC PANEL
Anion gap: 8 (ref 5–15)
BUN: 37 mg/dL — ABNORMAL HIGH (ref 8–23)
CO2: 16 mmol/L — ABNORMAL LOW (ref 22–32)
Calcium: 8.4 mg/dL — ABNORMAL LOW (ref 8.9–10.3)
Chloride: 114 mmol/L — ABNORMAL HIGH (ref 98–111)
Creatinine, Ser: 4.28 mg/dL — ABNORMAL HIGH (ref 0.44–1.00)
GFR, Estimated: 11 mL/min — ABNORMAL LOW (ref >60)
Glucose, Bld: 96 mg/dL (ref 70–99)
Potassium: 3.9 mmol/L (ref 3.5–5.1)
Sodium: 138 mmol/L (ref 135–145)

## 2021-03-07 LAB — CBC
HCT: 26.9 % — ABNORMAL LOW (ref 36.0–46.0)
Hemoglobin: 8.3 g/dL — ABNORMAL LOW (ref 12.0–15.0)
MCH: 26.9 pg (ref 26.0–34.0)
MCHC: 30.9 g/dL (ref 30.0–36.0)
MCV: 87.1 fL (ref 80.0–100.0)
Platelets: 362 10*3/uL (ref 150–400)
RBC: 3.09 MIL/uL — ABNORMAL LOW (ref 3.87–5.11)
RDW: 15.4 % (ref 11.5–15.5)
WBC: 11.7 10*3/uL — ABNORMAL HIGH (ref 4.0–10.5)
nRBC: 0 % (ref 0.0–0.2)

## 2021-03-07 MED ORDER — SODIUM BICARBONATE 650 MG PO TABS
650.0000 mg | ORAL_TABLET | Freq: Two times a day (BID) | ORAL | Status: DC
  Administered 2021-03-07: 650 mg via ORAL
  Filled 2021-03-07: qty 1

## 2021-03-07 MED ORDER — CEPHALEXIN 500 MG PO CAPS: 500.0000 mg | ORAL_CAPSULE | Freq: Two times a day (BID) | ORAL | Status: DC

## 2021-03-07 MED ORDER — METOPROLOL SUCCINATE ER 25 MG PO TB24: 25.0000 mg | ORAL_TABLET | Freq: Every day | ORAL | 3 refills | Status: DC

## 2021-03-07 MED ORDER — CEPHALEXIN 500 MG PO CAPS: 500.0000 mg | ORAL_CAPSULE | Freq: Two times a day (BID) | ORAL | 0 refills | Status: AC

## 2021-03-07 MED ORDER — SODIUM CHLORIDE 0.9 % IV SOLN
2.0000 g | Freq: Once | INTRAVENOUS | Status: AC
  Administered 2021-03-07: 2 g via INTRAVENOUS
  Filled 2021-03-07: qty 20

## 2021-03-07 MED ORDER — DOXYCYCLINE HYCLATE 100 MG PO TABS: 100.0000 mg | ORAL_TABLET | Freq: Two times a day (BID) | ORAL | 0 refills | Status: AC

## 2021-03-07 NOTE — Care Management Important Message (Signed)
Important Message  Patient Details  Name: Brittany Charles MRN: [id number] Date of Birth: 1950/10/11   Medicare Important Message Given:  Yes Patient left prior to IM delivery will mail to the patient home address.    [name] 03/07/2021, 3:18 PM

## 2021-03-07 NOTE — Discharge Summary (Signed)
Physician Discharge Summary  Brittany Charles PRF:[id number] DOB: 1950/06/24 DOA: 03/03/2021  PCP: Wendie Agreste, MD  Admit date: 03/03/2021 Discharge date: 03/07/2021  Admitted From: HOME Disposition:  HOME  Recommendations for Outpatient Follow-up:  Follow up with PCP, NEPHROLOGY  in 1-2 weeks Please obtain BMP/CBC in one week Please follow up on the following pending TEST- NEEDS CXR in 3-4 WKS  Home Health:NO  Equipment/Devices: NONE  Discharge Condition: Stable Code Status:   Code Status: Full Code Diet recommendation:  Diet Order             Diet - low sodium heart healthy           Diet Heart Room service appropriate? Yes; Fluid consistency: Thin  Diet effective now                    Brief/Interim Summary: 70 year old female with past medical history of paroxysmal atrial fibrillation, diastolic CHF, hypertension, GERD, lupus, lupus nephritis, CKD stage V, baseline creatinine 4.2, hyperlipidemia, CAD came to [facility], ED with complaints of shortness of breath, cough chest discomfort, nausea and vomiting. In the ED chest x-ray showed left lower lobe infiltrate and CT imaging of abdomen/pelvis revealed no intra-abdominal pathology but confirmed left lower lobe pneumonia.  Patient started on IV antibiotics. And admitted and treated.  No cough no chest pain, blood culture no growth so far.  Not needing oxygen pneumonia is a stable and improved we will continue oral antibiotics to complete the course with instruction for repeat chest x-ray 3-4 wks. Patient was seen by her nephrologist-at this time her CKD stage V progressive, no uremic signs, blood pressure is controlled, plan is for outpatient follow-up in 2 to 4 weeks.  Discharge Diagnoses:  Pneumonia of left lower lobe due to infectious organism: Clinically improved,No cough no chest pain, blood culture no growth so far.  Not needing oxygen , - we will continue oral antibiotics to complete the course with  instruction for repeat chest x-ray 3-4 wks.  CKD stage V progressive, no uremic signs. Patient was seen by her nephrologist-at this time her . blood pressure is controlled, plan is for outpatient follow-up in 2 to 4 weeks.  Elevated troponin 69, 70, 68 in the setting of CKD pneumonia no chest pain, suspect demand ischemia.   EKG with no acute ST or T wave changes, sinus tachycardia.  Patient is on Eliquis, beta-blocker.  Continue same. History of CAD, continue her anticoagulation, statin and beta-blocker. Essential hypertension: Blood pressure is controlled, continue home medication HLD on rosuvastatin  PAF in normal sinus rhythm continue metoprolol Cardizem and anticoagulation with Eliquis Iron deficiency anemia s/p IV Feraheme and Aranesp, outpatient follow-up with nephrology History of Lupus Gout on allopurinol Consults: nephrology  Subjective: Alert awake oriented, ambulatory, no chest pain no cough fever chills.  Wants to go home today daughter at the bedside and agreeable  Discharge Exam: Vitals:   03/06/21 2133 03/07/21 0607  BP: (!) 164/63 (!) 163/70  Pulse:  80  Resp:  16  Temp:  99.6 F (37.6 C)  SpO2:  95%   General: Pt is alert, awake, not in acute distress Cardiovascular: RRR, S1/S2 +, no rubs, no gallops Respiratory: CTA bilaterally, no wheezing, no rhonchi Abdominal: Soft, NT, ND, bowel sounds + Extremities: no edema, no cyanosis  Discharge Instructions  Discharge Instructions     Diet - low sodium heart healthy   Complete by: As directed    Discharge instructions   Complete  by: As directed    Please call call MD or return to ER for similar or worsening recurring problem that brought you to hospital or if any fever,nausea/vomiting,abdominal pain, uncontrolled pain, chest pain,  shortness of breath or any other alarming symptoms.  Please follow-up your doctor as instructed in a week time and call the office for appointment.  CXR in 3-4 wks fROM  PCP Please avoid alcohol, smoking, or any other illicit substance and maintain healthy habits including taking your regular medications as prescribed.  You were cared for by a hospitalist during your hospital stay. If you have any questions about your discharge medications or the care you received while you were in the hospital after you are discharged, you can call the unit and ask to speak with the hospitalist on call if the hospitalist that took care of you is not available.  Once you are discharged, your primary care physician will handle any further medical issues. Please note that NO REFILLS for any discharge medications will be authorized once you are discharged, as it is imperative that you return to your primary care physician (or establish a relationship with a primary care physician if you do not have one) for your aftercare needs so that they can reassess your need for medications and monitor your lab values   Increase activity slowly   Complete by: As directed       Allergies as of 03/07/2021       Reactions   Zithromax [azithromycin] Nausea And Vomiting        Medication List     TAKE these medications    albuterol 108 (90 Base) MCG/ACT inhaler Commonly known as: VENTOLIN HFA Inhale 2 puffs into the lungs every 6 (six) hours as needed for wheezing or shortness of breath.   allopurinol 100 MG tablet Commonly known as: ZYLOPRIM TAKE 1 TABLET EVERY DAY   apixaban 5 MG Tabs tablet Commonly known as: Eliquis Take 1 tablet (5 mg total) by mouth 2 (two) times daily.   ascorbic acid 500 MG tablet Commonly known as: VITAMIN C Take 1 tablet (500 mg total) by mouth daily.   calcitRIOL 0.25 MCG capsule Commonly known as: ROCALTROL Take 0.25 mcg by mouth daily.   cephALEXin 500 MG capsule Commonly known as: KEFLEX Take 1 capsule (500 mg total) by mouth 2 (two) times daily for 4 days.   Dilt-XR 180 MG 24 hr capsule Generic drug: diltiazem Take 180 mg by mouth  daily.   doxazosin 1 MG tablet Commonly known as: CARDURA Take 1 mg by mouth daily.   doxycycline 100 MG tablet Commonly known as: VIBRA-TABS Take 1 tablet (100 mg total) by mouth every 12 (twelve) hours for 4 days.   hydrALAZINE 50 MG tablet Commonly known as: APRESOLINE TAKE 1 TABLET THREE TIMES DAILY   metoprolol succinate 25 MG 24 hr tablet Commonly known as: TOPROL-XL Take 1 tablet (25 mg total) by mouth daily. Take with or immediately following a meal.   predniSONE 50 MG tablet Commonly known as: DELTASONE Take 25 mg by mouth daily as needed (gout flare).   rosuvastatin 20 MG tablet Commonly known as: CRESTOR Take 1 tablet (20 mg total) by mouth daily.   sodium bicarbonate 650 MG tablet Take 650 mg by mouth 2 (two) times daily.   zinc sulfate 220 (50 Zn) MG capsule Take 1 capsule (220 mg total) by mouth daily.        Follow-up Information     Merri Ray  R, MD Follow up in 1 week(s).   Specialties: Family Medicine, Sports Medicine Contact information: [place] [address] [phone]         Jerline Pain, MD .   Specialty: Cardiology Contact information: [address] [address] [phone]         Justin Mend, MD Follow up in 1 week(s).   Specialty: Internal Medicine Contact information: [place] [address] [phone]                Allergies  Allergen Reactions   Zithromax [Azithromycin] Nausea And Vomiting    The results of significant diagnostics from this hospitalization (including imaging, microbiology, ancillary and laboratory) are listed below for reference.    Microbiology: Recent Results (from the past 240 hour(s))  Blood culture (routine x 2)     Status: None (Preliminary result)   Collection Time: 03/03/21 10:00 PM   Specimen: BLOOD LEFT ARM  Result Value Ref Range Status   Specimen Description BLOOD LEFT ARM  Final   Special Requests    Final    BOTTLES DRAWN AEROBIC AND ANAEROBIC Blood Culture adequate volume   Culture   Final    NO GROWTH 3 DAYS Performed at [facility], [address], [place], [place] [address]    Report Status PENDING  Incomplete  Resp Panel by RT-PCR (Flu A&B, Covid) Nasopharyngeal Swab     Status: None   Collection Time: 03/03/21 10:05 PM   Specimen: Nasopharyngeal Swab; Nasopharyngeal(NP) swabs in vial transport medium  Result Value Ref Range Status   SARS Coronavirus 2 by RT PCR NEGATIVE NEGATIVE Final    Comment: (NOTE) SARS-CoV-2 target nucleic acids are NOT DETECTED.  The SARS-CoV-2 RNA is generally detectable in upper respiratory specimens during the acute phase of infection. The lowest concentration of SARS-CoV-2 viral copies this assay can detect is 138 copies/mL. A negative result does not preclude SARS-Cov-2 infection and should not be used as the sole basis for treatment or other patient management decisions. A negative result may occur with  improper specimen collection/handling, submission of specimen other than nasopharyngeal swab, presence of viral mutation(s) within the areas targeted by this assay, and inadequate number of viral copies(<138 copies/mL). A negative result must be combined with clinical observations, patient history, and epidemiological information. The expected result is Negative.  Fact Sheet for Patients:  EntrepreneurPulse.com.au  Fact Sheet for Healthcare Providers:  IncredibleEmployment.be  This test is no t yet approved or cleared by the [place] FDA and  has been authorized for detection and/or diagnosis of SARS-CoV-2 by FDA under an Emergency Use Authorization (EUA). This EUA will remain  in effect (meaning this test can be used) for the duration of the COVID-19 declaration under Section 564(b)(1) of the Act, 21 U.S.C.section 360bbb-3(b)(1), unless the authorization is terminated  or revoked sooner.        Influenza A by PCR NEGATIVE NEGATIVE Final   Influenza B by PCR NEGATIVE NEGATIVE Final    Comment: (NOTE) The Xpert Xpress SARS-CoV-2/FLU/RSV plus assay is intended as an aid in the diagnosis of influenza from Nasopharyngeal swab specimens and should not be used as a sole basis for treatment. Nasal washings and aspirates are unacceptable for Xpert Xpress SARS-CoV-2/FLU/RSV testing.  Fact Sheet for Patients: EntrepreneurPulse.com.au  Fact Sheet for Healthcare Providers: IncredibleEmployment.be  This test is not yet approved or cleared by the [place] FDA and has been authorized for detection  and/or diagnosis of SARS-CoV-2 by FDA under an Emergency Use Authorization (EUA). This EUA will remain in effect (meaning this test can be used) for the duration of the COVID-19 declaration under Section 564(b)(1) of the Act, 21 U.S.C. section 360bbb-3(b)(1), unless the authorization is terminated or revoked.  Performed at [facility], [place] [address] [place], [place] [address]   Blood culture (routine x 2)     Status: None (Preliminary result)   Collection Time: 03/03/21 10:21 PM   Specimen: BLOOD RIGHT ARM  Result Value Ref Range Status   Specimen Description BLOOD RIGHT ARM  Final   Special Requests   Final    BOTTLES DRAWN AEROBIC ONLY Blood Culture results may not be optimal due to an excessive volume of blood received in culture bottles   Culture   Final    NO GROWTH 3 DAYS Performed at [facility], [place] [address] [place], [place] [address]    Report Status PENDING  Incomplete    Procedures/Studies: CT ABDOMEN PELVIS WO CONTRAST  Result Date: 03/04/2021 CLINICAL DATA:  Abdominal pain and fever. EXAM: CT ABDOMEN AND PELVIS WITHOUT CONTRAST TECHNIQUE: Multidetector CT imaging of the abdomen and pelvis was performed following the standard protocol without IV contrast. COMPARISON:  CT abdomen pelvis dated 01/11/2017.  FINDINGS: Evaluation of this exam is limited in the absence of intravenous contrast. Lower chest: Partially visualized left lung base consolidation most consistent with pneumonia. Aspiration is not excluded. Clinical correlation recommended. No intra-abdominal free air or free fluid. Hepatobiliary: No focal liver abnormality is seen. No gallstones, gallbladder wall thickening, or biliary dilatation. Pancreas: Unremarkable. No pancreatic ductal dilatation or surrounding inflammatory changes. Spleen: Normal in size without focal abnormality. Adrenals/Urinary Tract: The adrenal glands unremarkable. There is a 2.5 cm hypodense lesion in the upper pole of the left kidney which is not characterized on this noncontrast CT but likely a cyst. There is no hydronephrosis or nephrolithiasis on either side. The visualized ureters and urinary bladder appear unremarkable. Stomach/Bowel: There is no bowel obstruction or active inflammation. The appendix is normal. Vascular/Lymphatic: Advanced aortoiliac atherosclerotic disease. The IVC is unremarkable. No portal venous gas. There is no adenopathy. Reproductive: Hysterectomy.  No adnexal masses Other: The none Musculoskeletal: Degenerative changes of the spine. No acute osseous pathology. IMPRESSION: 1. No acute intra-abdominal or pelvic pathology. No hydronephrosis or nephrolithiasis. 2. Partially visualized left lung base consolidation most consistent with pneumonia. Aspiration is not excluded. 3. Aortic Atherosclerosis (ICD10-I70.0). Electronically Signed   By: Anner Crete M.D.   On: 03/04/2021 00:49   DG Chest Port 1 View  Result Date: 03/03/2021 CLINICAL DATA:  Fever and chest pain. EXAM: PORTABLE CHEST 1 VIEW COMPARISON:  Chest x-ray 12/30/2019. FINDINGS: Heart is enlarged, unchanged. There is some strandy and patchy opacities in the retrocardiac region. There is no pleural effusion or pneumothorax. No acute fractures are seen. IMPRESSION: Minimal retrocardiac  opacities may represent atelectasis or infection. Stable cardiomegaly. Electronically Signed   By: Ronney Asters M.D.   On: 03/03/2021 22:13    Labs: BNP (last 3 results) Recent Labs    03/03/21 2219  BNP 371.6*   Basic Metabolic Panel: Recent Labs  Lab 03/03/21 2158 03/04/21 0013 03/05/21 0254 03/06/21 0210 03/07/21 0149  NA 136 135 139 138 138  K 4.3 4.2 4.4 3.7 3.9  CL 105 106 112* 114* 114*  CO2 20* 16* 17* 17* 16*  GLUCOSE 120* 117* 92 136* 96  BUN 38* 39* 39* 40* 37*  CREATININE 4.55* 4.82* 4.43* 4.59* 4.28*  CALCIUM 9.1 8.9 8.7* 8.6* 8.4*  MG  --  2.0  --   --   --    Liver Function Tests: Recent Labs  Lab 03/03/21 2158 03/04/21 0013  AST 17 17  ALT 14 12  ALKPHOS 98 95  BILITOT 0.6 0.6  PROT 7.6 7.1  ALBUMIN 3.0* 2.8*   Recent Labs  Lab 03/03/21 2158  LIPASE 24   No results for input(s): AMMONIA in the last 168 hours. CBC: Recent Labs  Lab 03/03/21 2219 03/04/21 0013 03/05/21 0254 03/06/21 0210 03/07/21 0149  WBC 21.3* 20.7* 20.0* 14.1* 11.7*  NEUTROABS 18.0* 17.3*  --   --   --   HGB 9.8* 9.3* 8.8* 8.2* 8.3*  HCT 31.6* 31.1* 30.8* 27.1* 26.9*  MCV 87.8 90.1 93.9 87.7 87.1  PLT 304 294 286 334 362   Cardiac Enzymes: No results for input(s): CKTOTAL, CKMB, CKMBINDEX, TROPONINI in the last 168 hours. BNP: Invalid input(s): POCBNP CBG: No results for input(s): GLUCAP in the last 168 hours. D-Dimer No results for input(s): DDIMER in the last 72 hours. Hgb A1c No results for input(s): HGBA1C in the last 72 hours. Lipid Profile No results for input(s): CHOL, HDL, LDLCALC, TRIG, CHOLHDL, LDLDIRECT in the last 72 hours. Thyroid function studies No results for input(s): TSH, T4TOTAL, T3FREE, THYROIDAB in the last 72 hours.  Invalid input(s): FREET3 Anemia work up Recent Labs    03/04/21 2003  VITAMINB12 262  FOLATE 15.4  TIBC 158*  IRON 7*   Urinalysis    Component Value Date/Time   COLORURINE YELLOW 03/03/2021 2149    APPEARANCEUR HAZY (A) 03/03/2021 2149   LABSPEC 1.012 03/03/2021 2149   PHURINE 6.0 03/03/2021 2149   GLUCOSEU NEGATIVE 03/03/2021 2149   HGBUR NEGATIVE 03/03/2021 2149   BILIRUBINUR NEGATIVE 03/03/2021 2149   BILIRUBINUR neg 09/12/2014 1612   KETONESUR NEGATIVE 03/03/2021 2149   PROTEINUR >=300 (A) 03/03/2021 2149   UROBILINOGEN 0.2 09/12/2014 1612   UROBILINOGEN 0.2 10/01/2012 1845   NITRITE NEGATIVE 03/03/2021 2149   LEUKOCYTESUR NEGATIVE 03/03/2021 2149   Sepsis Labs Invalid input(s): PROCALCITONIN,  WBC,  LACTICIDVEN Microbiology Recent Results (from the past 240 hour(s))  Blood culture (routine x 2)     Status: None (Preliminary result)   Collection Time: 03/03/21 10:00 PM   Specimen: BLOOD LEFT ARM  Result Value Ref Range Status   Specimen Description BLOOD LEFT ARM  Final   Special Requests   Final    BOTTLES DRAWN AEROBIC AND ANAEROBIC Blood Culture adequate volume   Culture   Final    NO GROWTH 3 DAYS Performed at [facility], [place] [address] [place], [place] [address]    Report Status PENDING  Incomplete  Resp Panel by RT-PCR (Flu A&B, Covid) Nasopharyngeal Swab     Status: None   Collection Time: 03/03/21 10:05 PM   Specimen: Nasopharyngeal Swab; Nasopharyngeal(NP) swabs in vial transport medium  Result Value Ref Range Status   SARS Coronavirus 2 by RT PCR NEGATIVE NEGATIVE Final    Comment: (NOTE) SARS-CoV-2 target nucleic acids are NOT DETECTED.  The SARS-CoV-2 RNA is generally detectable in upper respiratory specimens during the acute phase of infection. The lowest concentration of SARS-CoV-2 viral copies this assay can detect is 138 copies/mL. A negative result does not preclude SARS-Cov-2 infection and should not be used as the sole basis for treatment or other patient management decisions. A negative result may occur with  improper specimen collection/handling, submission of specimen other than nasopharyngeal swab, presence of viral mutation(s)  within the areas targeted by this assay, and inadequate number of viral copies(<138 copies/mL). A negative result must be combined with clinical observations, patient history, and epidemiological information. The expected result is Negative.  Fact Sheet for Patients:  EntrepreneurPulse.com.au  Fact Sheet for Healthcare Providers:  IncredibleEmployment.be  This test is no t yet approved or cleared by the [place] FDA and  has been authorized for detection and/or diagnosis of SARS-CoV-2 by FDA under an Emergency Use Authorization (EUA). This EUA will remain  in effect (meaning this test can be used) for the duration of the COVID-19 declaration under Section 564(b)(1) of the Act, 21 U.S.C.section 360bbb-3(b)(1), unless the authorization is terminated  or revoked sooner.       Influenza A by PCR NEGATIVE NEGATIVE Final   Influenza B by PCR NEGATIVE NEGATIVE Final    Comment: (NOTE) The Xpert Xpress SARS-CoV-2/FLU/RSV plus assay is intended as an aid in the diagnosis of influenza from Nasopharyngeal swab specimens and should not be used as a sole basis for treatment. Nasal washings and aspirates are unacceptable for Xpert Xpress SARS-CoV-2/FLU/RSV testing.  Fact Sheet for Patients: EntrepreneurPulse.com.au  Fact Sheet for Healthcare Providers: IncredibleEmployment.be  This test is not yet approved or cleared by the [place] FDA and has been authorized for detection and/or diagnosis of SARS-CoV-2 by FDA under an Emergency Use Authorization (EUA). This EUA will remain in effect (meaning this test can be used) for the duration of the COVID-19 declaration under Section 564(b)(1) of the Act, 21 U.S.C. section 360bbb-3(b)(1), unless the authorization is terminated or revoked.  Performed at [facility], [place] [address] [place], [place] [address]   Blood culture (routine x 2)     Status: None  (Preliminary result)   Collection Time: 03/03/21 10:21 PM   Specimen: BLOOD RIGHT ARM  Result Value Ref Range Status   Specimen Description BLOOD RIGHT ARM  Final   Special Requests   Final    BOTTLES DRAWN AEROBIC ONLY Blood Culture results may not be optimal due to an excessive volume of blood received in culture bottles   Culture   Final    NO GROWTH 3 DAYS Performed at [facility], [place] [address] [place], [place] [address]    Report Status PENDING  Incomplete     Time coordinating discharge: 25 minutes  SIGNED: Antonieta Pert, MD  [facility] 03/07/2021, 10:04 AM  If 7PM-7AM, please contact night-coverage [email or web address]

## 2021-03-07 NOTE — Progress Notes (Signed)
[facility] Progress Note   Subjective:   Feeling improved, hopeful for d/c today.  No difficulty urinating. No uremic symtpoms  Objective Vitals:   03/06/21 0529 03/06/21 1357 03/06/21 2133 03/07/21 0607  BP: (!) 142/55 (!) 143/56 (!) 164/63 (!) 163/70  Pulse: 73 74  80  Resp: 15 16  16   Temp:  99 F (37.2 C)  99.6 F (37.6 C)  TempSrc:      SpO2: 99% 100%  95%  Weight:      Height:       Physical Exam General: Alert, oriented and in no acute distress HEENT: Pupils are equal round reactive to light, extract motions are intact, mucous membranes are moist   Neck: No JVD Heart: Regular rate and rhythm Lungs: Clear to auscultation bilaterally Abdomen: Obese, soft, nontender Extremities: Very minimal peripheral edema, obesity Skin: Warm and dry Neuro: Alert and nonfocal  Additional Objective Labs: Basic Metabolic Panel: Recent Labs  Lab 03/05/21 0254 03/06/21 0210 03/07/21 0149  NA 139 138 138  K 4.4 3.7 3.9  CL 112* 114* 114*  CO2 17* 17* 16*  GLUCOSE 92 136* 96  BUN 39* 40* 37*  CREATININE 4.43* 4.59* 4.28*  CALCIUM 8.7* 8.6* 8.4*   Liver Function Tests: Recent Labs  Lab 03/03/21 2158 03/04/21 0013  AST 17 17  ALT 14 12  ALKPHOS 98 95  BILITOT 0.6 0.6  PROT 7.6 7.1  ALBUMIN 3.0* 2.8*   Recent Labs  Lab 03/03/21 2158  LIPASE 24   CBC: Recent Labs  Lab 03/03/21 2219 03/04/21 0013 03/05/21 0254 03/06/21 0210 03/07/21 0149  WBC 21.3* 20.7* 20.0* 14.1* 11.7*  NEUTROABS 18.0* 17.3*  --   --   --   HGB 9.8* 9.3* 8.8* 8.2* 8.3*  HCT 31.6* 31.1* 30.8* 27.1* 26.9*  MCV 87.8 90.1 93.9 87.7 87.1  PLT 304 294 286 334 362   Blood Culture    Component Value Date/Time   SDES BLOOD RIGHT ARM 03/03/2021 2221   SPECREQUEST  03/03/2021 2221    BOTTLES DRAWN AEROBIC ONLY Blood Culture results may not be optimal due to an excessive volume of blood received in culture bottles   CULT  03/03/2021 2221    NO GROWTH 3 DAYS Performed at [facility], [place] [address], [place], [place] [address]    REPTSTATUS PENDING 03/03/2021 2221    Cardiac Enzymes: No results for input(s): CKTOTAL, CKMB, CKMBINDEX, TROPONINI in the last 168 hours. CBG: No results for input(s): GLUCAP in the last 168 hours. Iron Studies:  Recent Labs    03/04/21 2003  IRON 7*  TIBC 158*   @lablastinr3 @ Studies/Results: No results found. Medications:  cefTRIAXone (ROCEPHIN)  IV 2 g (03/06/21 2141)    allopurinol  100 mg Oral Daily   apixaban  5 mg Oral BID   darbepoetin (ARANESP) injection - NON-DIALYSIS  200 mcg Subcutaneous Q Thu-1800   diltiazem  180 mg Oral Daily   doxycycline  100 mg Oral Q12H   hydrALAZINE  50 mg Oral Q8H   metoprolol succinate  25 mg Oral Daily   rosuvastatin  20 mg Oral Daily   sodium bicarbonate  650 mg Oral BID    Assessment/Plan: 70 year old black female with medical problems to include stage IV/V CKD secondary to lupus nephritis followed by [facility] as outpatient.  She has been admitted for symptomatic pneumonia but overall improved and feels ready for discharge 1.CKD 5-has had known progressive CKD thought secondary to  lupus nephritis, being managed at [facility] by myself.  Has initiated transplant evaluation and dialysis planning.  Creatinine and GFR is not significantly different from baseline no uremic signs to warrant dialysis.  Ok to discharge and I'll f/u with her in clinic - already have appt in the next 2-4 weeks.  2. Hypertension/volume  -reasonable control at this time on Cardizem and Toprol as well as hydralazine.  Volume status is fine.  No changes recommended 3.  Hypoalbuminemia- albumin is low and not concerning but I think it is just in the setting of her acute illness.  Now that her appetite has improved I anticipate it will improve 4. Anemia  -seems more significant than what it has been in the outpatient setting.  Rec'd feraheme 510mg  and aranesp 200 on 10/27.  Will f/u outpt  for additional dosing PRN.  5. Dispo- from my standpoint would be okay for discharge today, per above close f/u in clinic already arranged.     Jannifer Hick MD 03/07/2021, 7:07 AM  [facility] Pager: [phone]

## 2021-03-08 LAB — CULTURE, BLOOD (ROUTINE X 2)
Culture: NO GROWTH
Culture: NO GROWTH
Special Requests: ADEQUATE

## 2021-03-10 ENCOUNTER — Encounter: Payer: Self-pay | Admitting: Family Medicine

## 2021-03-12 ENCOUNTER — Ambulatory Visit: Payer: Medicare HMO | Admitting: Obstetrics & Gynecology

## 2021-03-12 ENCOUNTER — Other Ambulatory Visit: Payer: Medicare HMO

## 2021-03-17 ENCOUNTER — Other Ambulatory Visit: Payer: Medicare HMO

## 2021-03-20 ENCOUNTER — Ambulatory Visit (INDEPENDENT_AMBULATORY_CARE_PROVIDER_SITE_OTHER): Payer: Medicare HMO | Admitting: Family Medicine

## 2021-03-20 DIAGNOSIS — Z23 Encounter for immunization: Secondary | ICD-10-CM | POA: Diagnosis not present

## 2021-03-20 NOTE — Progress Notes (Signed)
Brittany Charles is a 70 y.o. female presents to the office today for Shingles Shot per physician's orders.   [name]

## 2021-04-10 DIAGNOSIS — N185 Chronic kidney disease, stage 5: Secondary | ICD-10-CM | POA: Diagnosis not present

## 2021-04-10 DIAGNOSIS — M329 Systemic lupus erythematosus, unspecified: Secondary | ICD-10-CM | POA: Diagnosis not present

## 2021-04-10 DIAGNOSIS — D649 Anemia, unspecified: Secondary | ICD-10-CM | POA: Diagnosis not present

## 2021-04-10 DIAGNOSIS — R7302 Impaired glucose tolerance (oral): Secondary | ICD-10-CM | POA: Diagnosis not present

## 2021-04-10 DIAGNOSIS — N2581 Secondary hyperparathyroidism of renal origin: Secondary | ICD-10-CM | POA: Diagnosis not present

## 2021-04-10 DIAGNOSIS — E872 Acidosis, unspecified: Secondary | ICD-10-CM | POA: Diagnosis not present

## 2021-04-10 DIAGNOSIS — I120 Hypertensive chronic kidney disease with stage 5 chronic kidney disease or end stage renal disease: Secondary | ICD-10-CM | POA: Diagnosis not present

## 2021-04-10 DIAGNOSIS — M109 Gout, unspecified: Secondary | ICD-10-CM | POA: Diagnosis not present

## 2021-04-25 ENCOUNTER — Encounter (HOSPITAL_COMMUNITY)
Admission: RE | Admit: 2021-04-25 | Discharge: 2021-04-25 | Disposition: A | Discharge status: Home / Self Care | Payer: Medicare HMO | Source: Ambulatory Visit | Attending: Internal Medicine | Admitting: Internal Medicine

## 2021-04-25 ENCOUNTER — Other Ambulatory Visit: Payer: Self-pay

## 2021-04-25 VITALS — BP 159/61 | HR 85 | Temp 97.4°F

## 2021-04-25 DIAGNOSIS — N185 Chronic kidney disease, stage 5: Secondary | ICD-10-CM | POA: Diagnosis not present

## 2021-04-25 LAB — POCT HEMOGLOBIN-HEMACUE: Hemoglobin: 10.5 g/dL — ABNORMAL LOW (ref 12.0–15.0)

## 2021-04-25 MED ORDER — EPOETIN ALFA-EPBX 10000 UNIT/ML IJ SOLN
20000.0000 [IU] | INTRAMUSCULAR | Status: DC
  Administered 2021-04-25: 20000 [IU] via SUBCUTANEOUS

## 2021-04-25 MED ORDER — EPOETIN ALFA-EPBX 10000 UNIT/ML IJ SOLN
INTRAMUSCULAR | Status: AC
  Filled 2021-04-25: qty 2

## 2021-05-08 ENCOUNTER — Other Ambulatory Visit: Payer: Self-pay | Admitting: *Deleted

## 2021-05-08 DIAGNOSIS — N185 Chronic kidney disease, stage 5: Secondary | ICD-10-CM

## 2021-05-13 ENCOUNTER — Encounter: Payer: Self-pay | Admitting: Gastroenterology

## 2021-05-20 DIAGNOSIS — M329 Systemic lupus erythematosus, unspecified: Secondary | ICD-10-CM | POA: Diagnosis not present

## 2021-05-20 DIAGNOSIS — D649 Anemia, unspecified: Secondary | ICD-10-CM | POA: Diagnosis not present

## 2021-05-20 DIAGNOSIS — E872 Acidosis, unspecified: Secondary | ICD-10-CM | POA: Diagnosis not present

## 2021-05-20 DIAGNOSIS — N185 Chronic kidney disease, stage 5: Secondary | ICD-10-CM | POA: Diagnosis not present

## 2021-05-20 DIAGNOSIS — I120 Hypertensive chronic kidney disease with stage 5 chronic kidney disease or end stage renal disease: Secondary | ICD-10-CM | POA: Diagnosis not present

## 2021-05-20 DIAGNOSIS — R7302 Impaired glucose tolerance (oral): Secondary | ICD-10-CM | POA: Diagnosis not present

## 2021-05-20 DIAGNOSIS — N2581 Secondary hyperparathyroidism of renal origin: Secondary | ICD-10-CM | POA: Diagnosis not present

## 2021-05-20 DIAGNOSIS — E785 Hyperlipidemia, unspecified: Secondary | ICD-10-CM | POA: Diagnosis not present

## 2021-05-22 ENCOUNTER — Other Ambulatory Visit (HOSPITAL_COMMUNITY): Payer: Self-pay | Admitting: *Deleted

## 2021-05-22 NOTE — Progress Notes (Signed)
Office Note     CC:  ESRD Requesting Provider:  Wendie Agreste, MD  HPI: Brittany Charles is a Right handed 71 y.o. (11-13-1950) female with kidney disease who presents at the request of Wendie Agreste, MD for permanent HD access. Past medical history is significant for lupus, and remote lupus nephritis requiring treatment-fairly advanced CKD stage IV/V followed by Dr. Johnney Ou at [facility]. Patient has initiated the transplant evaluation and has also been educated regarding dialysis planning.  The patient has had no prior access procedures, no lines, not currently on dialysis. She worked at the [employer] for 30 years prior to retiring.  She has 1 son, several grandchildren and 4 great-grandchildren whom she spends time with on a regular basis.  On exam today, Brittany Charles was doing well.  He had no complaints.  He is aware that she will need hemodialysis soon, but was not educated on the process or fistula creation.  The pt is  on a statin for cholesterol management.  The pt is - on a daily aspirin.   Other AC:  eliquis - afib The pt is  on medications for hypertension.   The pt is not diabetic. Tobacco hx:  -  Past Medical History:  Diagnosis Date   Arthritis    CHF (congestive heart failure) (HCC)    Fibroids    Hypertension    Lupus (systemic lupus erythematosus) ([place])    Myocardial infarction [facility])    Renal insufficiency     Past Surgical History:  Procedure Laterality Date   ABDOMINAL HYSTERECTOMY  10/27/2000   TAH, BSO   LEFT AND RIGHT HEART CATHETERIZATION WITH CORONARY ANGIOGRAM Right 04/21/2011   Procedure: LEFT AND RIGHT HEART CATHETERIZATION WITH CORONARY ANGIOGRAM;  Surgeon: [name], MD;  Location: [facility] CATH LAB;  Service: Cardiovascular;  Laterality: Right;   TONSILLECTOMY      Social History   Socioeconomic History   Marital status: Single    Spouse name: Not on file   Number of children: Not on file   Years of education: Not on file    Highest education level: Not on file  Occupational History   Not on file  Tobacco Use   Smoking status: Never   Smokeless tobacco: Never  Vaping Use   Vaping Use: Never used  Substance and Sexual Activity   Alcohol use: No   Drug use: No   Sexual activity: Never  Other Topics Concern   Not on file  Social History Narrative   Not on file   Social Determinants of Health   Financial Resource Strain: Not on file  Food Insecurity: Not on file  Transportation Needs: Not on file  Physical Activity: Not on file  Stress: Not on file  Social Connections: Not on file  Intimate Partner Violence: Not on file    Family History  Problem Relation Age of Onset   Hypertension Mother    Heart disease Mother        CHF   Stroke Mother    Hyperlipidemia Father    Stroke Father    Hypertension Father    Stroke Maternal Grandmother    Colon cancer Unknown    Breast cancer Unknown     Current Outpatient Medications  Medication Sig Dispense Refill   albuterol (VENTOLIN HFA) 108 (90 Base) MCG/ACT inhaler Inhale 2 puffs into the lungs every 6 (six) hours as needed for wheezing or shortness of breath. 8 g 2   allopurinol (ZYLOPRIM) 100 MG  tablet TAKE 1 TABLET EVERY DAY (Patient taking differently: Take 100 mg by mouth daily.) 90 tablet 0   apixaban (ELIQUIS) 5 MG TABS tablet Take 1 tablet (5 mg total) by mouth 2 (two) times daily. 60 tablet 5   ascorbic acid (VITAMIN C) 500 MG tablet Take 1 tablet (500 mg total) by mouth daily. 30 tablet 0   calcitRIOL (ROCALTROL) 0.25 MCG capsule Take 0.25 mcg by mouth daily.     DILT-XR 180 MG 24 hr capsule Take 180 mg by mouth daily.     doxazosin (CARDURA) 1 MG tablet Take 1 mg by mouth daily.     hydrALAZINE (APRESOLINE) 50 MG tablet TAKE 1 TABLET THREE TIMES DAILY (Patient taking differently: Take 50 mg by mouth 3 (three) times daily.) 270 tablet 0   metoprolol succinate (TOPROL-XL) 25 MG 24 hr tablet Take 1 tablet (25 mg total) by mouth daily. Take  with or immediately following a meal. 90 tablet 3   predniSONE (DELTASONE) 50 MG tablet Take 25 mg by mouth daily as needed (gout flare).     rosuvastatin (CRESTOR) 20 MG tablet Take 1 tablet (20 mg total) by mouth daily. 90 tablet 3   sodium bicarbonate 650 MG tablet Take 650 mg by mouth 2 (two) times daily.     zinc sulfate 220 (50 Zn) MG capsule Take 1 capsule (220 mg total) by mouth daily. 30 capsule 0   No current facility-administered medications for this visit.    Allergies  Allergen Reactions   Zithromax [Azithromycin] Nausea And Vomiting     REVIEW OF SYSTEMS:   [X]  denotes positive finding, [ ]  denotes negative finding Cardiac  Comments:  Chest pain or chest pressure:    Shortness of breath upon exertion:    Short of breath when lying flat:    Irregular heart rhythm:        Vascular    Pain in calf, thigh, or hip brought on by ambulation:    Pain in feet at night that wakes you up from your sleep:     Blood clot in your veins:    Leg swelling:         Pulmonary    Oxygen at home:    Productive cough:     Wheezing:         Neurologic    Sudden weakness in arms or legs:     Sudden numbness in arms or legs:     Sudden onset of difficulty speaking or slurred speech:    Temporary loss of vision in one eye:     Problems with dizziness:         Gastrointestinal    Blood in stool:     Vomited blood:         Genitourinary    Burning when urinating:     Blood in urine:        Psychiatric    Major depression:         Hematologic    Bleeding problems:    Problems with blood clotting too easily:        Skin    Rashes or ulcers:        Constitutional    Fever or chills:      PHYSICAL EXAMINATION:  There were no vitals filed for this visit.  General:  WDWN in NAD; vital signs documented above Gait: Not observed HENT: WNL, normocephalic Pulmonary: normal non-labored breathing , without Rales, rhonchi,  wheezing Cardiac: regular HR,  Abdomen: soft,  NT, no masses Skin: without rashes Vascular Exam/Pulses:  Right Left  Radial 2+ (normal) 2+ (normal)  Ulnar 2+ (normal) 2+ (normal)                   Extremities: without ischemic changes, without Gangrene , without cellulitis; without open wounds;  Musculoskeletal: no muscle wasting or atrophy  Neurologic: A&O X 3;  No focal weakness or paresthesias are detected Psychiatric:  The pt has Normal affect.   Non-Invasive Vascular Imaging:    +-----------------+-------------+----------+-----------------+   Right Cephalic    Diameter (cm) Depth (cm)     Findings        +-----------------+-------------+----------+-----------------+   Shoulder              0.45                                     +-----------------+-------------+----------+-----------------+   Prox upper arm        0.51                                     +-----------------+-------------+----------+-----------------+   Mid upper arm         0.44                                     +-----------------+-------------+----------+-----------------+   Dist upper arm        0.49                                     +-----------------+-------------+----------+-----------------+   Antecubital fossa     0.53                                     +-----------------+-------------+----------+-----------------+   Prox forearm          0.13                 non compressible    +-----------------+-------------+----------+-----------------+   Mid forearm           0.09                 chronic thrombus?   +-----------------+-------------+----------+-----------------+   Dist forearm          0.14                                     +-----------------+-------------+----------+-----------------+   +-----------------+-------------+----------+--------+   Right Basilic     Diameter (cm) Depth (cm) Findings   +-----------------+-------------+----------+--------+   Prox upper arm        0.45                             +-----------------+-------------+----------+--------+   Mid upper arm         0.46                            +-----------------+-------------+----------+--------+   Dist upper arm  0.42                            +-----------------+-------------+----------+--------+   Antecubital fossa     0.38                            +-----------------+-------------+----------+--------+   Prox forearm          0.42                            +-----------------+-------------+----------+--------+   +-----------------+-------------+----------+--------+   Left Cephalic     Diameter (cm) Depth (cm) Findings   +-----------------+-------------+----------+--------+   Shoulder              0.31                            +-----------------+-------------+----------+--------+   Prox upper arm        0.35                            +-----------------+-------------+----------+--------+   Mid upper arm         0.38                            +-----------------+-------------+----------+--------+   Dist upper arm        0.43                            +-----------------+-------------+----------+--------+   Antecubital fossa     0.40                            +-----------------+-------------+----------+--------+   Prox forearm          0.23                            +-----------------+-------------+----------+--------+   Mid forearm           0.27                            +-----------------+-------------+----------+--------+   Dist forearm          0.22                            +-----------------+-------------+----------+--------+   +-----------------+-------------+----------+--------+   Left Basilic      Diameter (cm) Depth (cm) Findings   +-----------------+-------------+----------+--------+   Prox upper arm        0.45                            +-----------------+-------------+----------+--------+   Mid upper arm         0.38                             +-----------------+-------------+----------+--------+   Dist upper arm        0.34                            +-----------------+-------------+----------+--------+  Antecubital fossa     0.29                            +-----------------+-------------+----------+--------+   Prox forearm          0.21                            +-----------------+-------------+----------+--------+     ASSESSMENT/PLAN:  Brittany Charles is a 70 y.o. female who presents with chronic kidney disease stage 5  Based on vein mapping and examination, there is suitable cephalic and basilic veins bilaterally for fistula creation.  Being that she is right-handed, she would be best suited for left brachiocephalic fistula creation. I had an extensive discussion with this patient in regards to the nature of access surgery, including risk, benefits, and alternatives.   The patient is aware that the risks of access surgery include but are not limited to: bleeding, infection, steal syndrome, nerve damage, ischemic monomelic neuropathy, failure of access to mature, complications related to venous hypertension, and possible need for additional access procedures in the future. I discussed with the patient the nature of the staged access procedure, specifically the need for a second operation to transpose the first stage fistula if it matures adequately.   The patient has  agreed to proceed with the above procedure which will be scheduled in the coming weeks.  Broadus John, MD [facility] [phone]

## 2021-05-23 ENCOUNTER — Ambulatory Visit (HOSPITAL_COMMUNITY)
Admission: RE | Admit: 2021-05-23 | Discharge: 2021-05-23 | Disposition: A | Discharge status: Home / Self Care | Payer: Medicare HMO | Source: Ambulatory Visit | Attending: Vascular Surgery | Admitting: Vascular Surgery

## 2021-05-23 ENCOUNTER — Other Ambulatory Visit: Payer: Self-pay

## 2021-05-23 ENCOUNTER — Encounter: Payer: Self-pay | Admitting: Vascular Surgery

## 2021-05-23 ENCOUNTER — Ambulatory Visit: Payer: Medicare HMO | Admitting: Vascular Surgery

## 2021-05-23 ENCOUNTER — Ambulatory Visit (INDEPENDENT_AMBULATORY_CARE_PROVIDER_SITE_OTHER)
Admission: RE | Admit: 2021-05-23 | Discharge: 2021-05-23 | Disposition: A | Discharge status: Home / Self Care | Payer: Medicare HMO | Source: Ambulatory Visit | Attending: Vascular Surgery | Admitting: Vascular Surgery

## 2021-05-23 ENCOUNTER — Ambulatory Visit (HOSPITAL_COMMUNITY)
Admission: RE | Admit: 2021-05-23 | Discharge: 2021-05-23 | Disposition: A | Discharge status: Home / Self Care | Payer: Medicare HMO | Source: Ambulatory Visit | Attending: Internal Medicine | Admitting: Internal Medicine

## 2021-05-23 VITALS — BP 151/63 | HR 71 | Temp 97.3°F | Resp 18

## 2021-05-23 VITALS — BP 159/79 | HR 61 | Temp 97.3°F | Resp 14 | Ht 65.0 in | Wt 193.0 lb

## 2021-05-23 DIAGNOSIS — N185 Chronic kidney disease, stage 5: Secondary | ICD-10-CM | POA: Diagnosis not present

## 2021-05-23 LAB — RENAL FUNCTION PANEL
Albumin: 3.8 g/dL (ref 3.5–5.0)
Anion gap: 12 (ref 5–15)
BUN: 51 mg/dL — ABNORMAL HIGH (ref 8–23)
CO2: 15 mmol/L — ABNORMAL LOW (ref 22–32)
Calcium: 9.2 mg/dL (ref 8.9–10.3)
Chloride: 112 mmol/L — ABNORMAL HIGH (ref 98–111)
Creatinine, Ser: 4.27 mg/dL — ABNORMAL HIGH (ref 0.44–1.00)
GFR, Estimated: 11 mL/min — ABNORMAL LOW (ref >60)
Glucose, Bld: 121 mg/dL — ABNORMAL HIGH (ref 70–99)
Phosphorus: 3.9 mg/dL (ref 2.5–4.6)
Potassium: 4.3 mmol/L (ref 3.5–5.1)
Sodium: 139 mmol/L (ref 135–145)

## 2021-05-23 LAB — IRON AND TIBC
Iron: 42 ug/dL (ref 28–170)
Saturation Ratios: 17 % (ref 10.4–31.8)
TIBC: 246 ug/dL — ABNORMAL LOW (ref 250–450)
UIBC: 204 ug/dL

## 2021-05-23 LAB — POCT HEMOGLOBIN-HEMACUE: Hemoglobin: 11.5 g/dL — ABNORMAL LOW (ref 12.0–15.0)

## 2021-05-23 LAB — FERRITIN: Ferritin: 52 ng/mL (ref 11–307)

## 2021-05-23 MED ORDER — EPOETIN ALFA-EPBX 10000 UNIT/ML IJ SOLN
20000.0000 [IU] | INTRAMUSCULAR | Status: DC
  Administered 2021-05-23: 20000 [IU] via SUBCUTANEOUS

## 2021-05-23 MED ORDER — EPOETIN ALFA-EPBX 10000 UNIT/ML IJ SOLN
INTRAMUSCULAR | Status: AC
  Filled 2021-05-23: qty 2

## 2021-05-24 LAB — PTH, INTACT AND CALCIUM
Calcium, Total (PTH): 9.2 mg/dL (ref 8.7–10.3)
PTH: 131 pg/mL — ABNORMAL HIGH (ref 15–65)

## 2021-05-26 ENCOUNTER — Encounter (HOSPITAL_COMMUNITY): Payer: Self-pay | Admitting: Vascular Surgery

## 2021-05-26 NOTE — Anesthesia Preprocedure Evaluation (Addendum)
Anesthesia Evaluation  Patient identified by MRN, date of birth, ID band Patient awake    Reviewed: Allergy & Precautions, NPO status , Patient's Chart, lab work & pertinent test results, reviewed documented beta blocker date and time   History of Anesthesia Complications Negative for: history of anesthetic complications  Airway Mallampati: II  TM Distance: >3 FB Neck ROM: Full    Dental  (+) Dental Advisory Given   Pulmonary asthma ,    Pulmonary exam normal        Cardiovascular hypertension, Pt. on medications and Pt. on home beta blockers + CAD and + Past MI  Normal cardiovascular exam   '22 Myoperfusion - No inducible transmural ischemic defect or transmural scar. EF 55% on stress.   '21 TTE - EF 65 to 70%. Mild aortic valve sclerosis is present, with no evidence of aortic valve stenosis.     Neuro/Psych negative neurological ROS  negative psych ROS   GI/Hepatic Neg liver ROS, GERD  Controlled and Medicated,  Endo/Other   Obesity   Renal/GU ESRFRenal disease     Musculoskeletal  (+) Arthritis ,  Gout    Abdominal   Peds  Hematology  On eliquis    Anesthesia Other Findings SLE   Reproductive/Obstetrics  Fibroid uterus                            Anesthesia Physical Anesthesia Plan  ASA: 4  Anesthesia Plan: Regional   Post-op Pain Management: Regional block and Tylenol PO (pre-op)   Induction:   PONV Risk Score and Plan: 2 and Propofol infusion and Treatment may vary due to age or medical condition  Airway Management Planned: Natural Airway and Simple Face Mask  Additional Equipment: None  Intra-op Plan:   Post-operative Plan:   Informed Consent: I have reviewed the patients History and Physical, chart, labs and discussed the procedure including the risks, benefits and alternatives for the proposed anesthesia with the patient or authorized representative who has  indicated his/her understanding and acceptance.       Plan Discussed with: CRNA and Anesthesiologist  Anesthesia Plan Comments:       Anesthesia Quick Evaluation

## 2021-05-26 NOTE — Progress Notes (Signed)
Anesthesia Chart Review: Same day workup  Follows with cardiology for history of non-obstructive CAD per [facility] 2012, SVT, paroxysmal A. fib on Eliquis, HTN.  Last seen by Ermalinda Barrios, PA-C 12/10/2020.  Per note, stable at that time, no chest pain, SVT/NSVT controlled on Toprol and diltiazem.  She was recommended follow-up in 6 months.  CKD 5 not yet on HD followed by Dr. Johnney Ou at [facility].  Per patient last dose Eliquis 05/24/2021.  BMP from 05/23/2021 reviewed, creatinine 4.27c/w CKD 5, otherwise unremarkable.  Patient will need day of surgery evaluation.  EKG 03/03/21: Sinus tachycardia with Premature atrial complexes. Rate 104. Anterior infarct , age undetermined  Event monitor 01/26/2020: Sinus rhythm with average heart rate of 77 bpm Occasional premature atrial contractions, 1.5% Rare premature ventricular contractions, less than 1% There were 2 episodes of ventricular tachycardia with 1 run lasting 21 seconds with an average heart rate of 131 bpm, slow VT. No symptoms reported. Another episode lasted 4 beats with a maximum rate of 164 bpm. Occasional episodes of SVT, paroxysmal atrial tachycardia were noted with the longest episode lasting 13 beats duration. No evidence of atrial fibrillation.  TTE 01/03/2020:  1. Left ventricular ejection fraction, by estimation, is 65 to 70%. The  left ventricle has normal function. The left ventricle has no regional  wall motion abnormalities. Left ventricular diastolic parameters were  normal.   2. Right ventricular systolic function is normal. The right ventricular  size is normal. Tricuspid regurgitation signal is inadequate for assessing  PA pressure.   3. The mitral valve is grossly normal. No evidence of mitral valve  regurgitation. No evidence of mitral stenosis.   4. The aortic valve is tricuspid. Aortic valve regurgitation is not  visualized. Mild aortic valve sclerosis is present, with no evidence   Cath 04/21/2011: Final  Conclusions:   1. Mild pulmonary hypertension 2. Mild to moderate LAD stenosis 3. No significant right coronary artery or left circumflex stenoses 4. Normal cardiac output   Brittany Charles [facility] Short Stay Center/Anesthesiology Phone [phone] 05/26/2021 4:25 PM

## 2021-05-26 NOTE — Progress Notes (Signed)
PCP - Merri Ray Cardiologist - [place] EKG - 03/04/21 Chest x-ray - n/a ECHO - 01/03/20 Cardiac Cath - 04/21/2011 Blood Thinner Instructions: Hold Eliquis for 2 days.  Last dose was on 05/24/21, per patient ERAS Protcol - n/a COVID TEST- n/a (ambulatory)  Anesthesia review: yes, heart history & abnormal EKG  -------------  SDW INSTRUCTIONS:  Your procedure is scheduled on 05/27/21. Please report to [facility] Main Entrance "A" at 7:00 A.M., and check in at the Admitting office. Call this number if you have problems the morning of surgery: [phone]   Remember: Do not eat or drink after midnight the night before your surgery  Medications to take morning of surgery with a sip of water include: Albuterol inhaler, Allopurinol, Dilt-XR, Doxazosin (Cardura), Hydralazine (Apresoline), Prednisone  Hold Eliquis 2 days prior (last dose on 1/14)  As of today, STOP taking any Aspirin (unless otherwise instructed by your surgeon), Aleve, Naproxen, Ibuprofen, Motrin, Advil, Goody's, BC's, all herbal medications, fish oil, and all vitamins.    The Morning of Surgery Do not wear jewelry, make-up or nail polish. Do not wear lotions, powders, or perfumes, or deodorant Do not bring valuables to the hospital. [facility] is not responsible for any belongings or valuables.  If you are a smoker, DO NOT Smoke 24 hours prior to surgery  If you wear a CPAP at night please bring your mask the morning of surgery   Remember that you must have someone to transport you home after your surgery, and remain with you for 24 hours if you are discharged the same day.  Please bring cases for contacts, glasses, hearing aids, dentures or bridgework because it cannot be worn into surgery.   Patients discharged the day of surgery will not be allowed to drive home.   Please shower the NIGHT BEFORE/MORNING OF SURGERY (use antibacterial soap like DIAL soap if possible). Wear comfortable clothes the morning of  surgery. Oral Hygiene is also important to reduce your risk of infection.  Remember - BRUSH YOUR TEETH THE MORNING OF SURGERY WITH YOUR REGULAR TOOTHPASTE  Patient denies shortness of breath, fever, cough and chest pain.

## 2021-05-27 ENCOUNTER — Encounter (HOSPITAL_COMMUNITY)
Admission: RE | Disposition: A | Discharge status: Home / Self Care | Payer: Self-pay | Source: Home / Self Care | Attending: Vascular Surgery

## 2021-05-27 ENCOUNTER — Ambulatory Visit (HOSPITAL_COMMUNITY): Payer: Medicare HMO | Admitting: Physician Assistant

## 2021-05-27 ENCOUNTER — Encounter (HOSPITAL_COMMUNITY): Payer: Self-pay | Admitting: Vascular Surgery

## 2021-05-27 ENCOUNTER — Ambulatory Visit (HOSPITAL_COMMUNITY)
Admission: RE | Admit: 2021-05-27 | Discharge: 2021-05-27 | Disposition: A | Discharge status: Home / Self Care | Payer: Medicare HMO | Attending: Vascular Surgery | Admitting: Vascular Surgery

## 2021-05-27 ENCOUNTER — Other Ambulatory Visit: Payer: Self-pay

## 2021-05-27 DIAGNOSIS — I252 Old myocardial infarction: Secondary | ICD-10-CM | POA: Diagnosis not present

## 2021-05-27 DIAGNOSIS — I4891 Unspecified atrial fibrillation: Secondary | ICD-10-CM | POA: Insufficient documentation

## 2021-05-27 DIAGNOSIS — Z7901 Long term (current) use of anticoagulants: Secondary | ICD-10-CM | POA: Insufficient documentation

## 2021-05-27 DIAGNOSIS — M3214 Glomerular disease in systemic lupus erythematosus: Secondary | ICD-10-CM | POA: Insufficient documentation

## 2021-05-27 DIAGNOSIS — I132 Hypertensive heart and chronic kidney disease with heart failure and with stage 5 chronic kidney disease, or end stage renal disease: Secondary | ICD-10-CM | POA: Diagnosis not present

## 2021-05-27 DIAGNOSIS — I120 Hypertensive chronic kidney disease with stage 5 chronic kidney disease or end stage renal disease: Secondary | ICD-10-CM | POA: Diagnosis not present

## 2021-05-27 DIAGNOSIS — N186 End stage renal disease: Secondary | ICD-10-CM | POA: Diagnosis not present

## 2021-05-27 DIAGNOSIS — N185 Chronic kidney disease, stage 5: Secondary | ICD-10-CM | POA: Diagnosis not present

## 2021-05-27 DIAGNOSIS — Z7982 Long term (current) use of aspirin: Secondary | ICD-10-CM | POA: Insufficient documentation

## 2021-05-27 DIAGNOSIS — I2510 Atherosclerotic heart disease of native coronary artery without angina pectoris: Secondary | ICD-10-CM | POA: Diagnosis not present

## 2021-05-27 DIAGNOSIS — I509 Heart failure, unspecified: Secondary | ICD-10-CM | POA: Diagnosis not present

## 2021-05-27 HISTORY — DX: Pneumonia, unspecified organism: J18.9

## 2021-05-27 HISTORY — DX: Gastro-esophageal reflux disease without esophagitis: K21.9

## 2021-05-27 HISTORY — DX: Anemia, unspecified: D64.9

## 2021-05-27 HISTORY — PX: AV FISTULA PLACEMENT: SHX1204

## 2021-05-27 LAB — POCT I-STAT, CHEM 8
BUN: 57 mg/dL — ABNORMAL HIGH (ref 8–23)
Calcium, Ion: 1.23 mmol/L (ref 1.15–1.40)
Chloride: 117 mmol/L — ABNORMAL HIGH (ref 98–111)
Creatinine, Ser: 5.4 mg/dL — ABNORMAL HIGH (ref 0.44–1.00)
Glucose, Bld: 114 mg/dL — ABNORMAL HIGH (ref 70–99)
HCT: 35 % — ABNORMAL LOW (ref 36.0–46.0)
Hemoglobin: 11.9 g/dL — ABNORMAL LOW (ref 12.0–15.0)
Potassium: 4.6 mmol/L (ref 3.5–5.1)
Sodium: 144 mmol/L (ref 135–145)
TCO2: 20 mmol/L — ABNORMAL LOW (ref 22–32)

## 2021-05-27 SURGERY — ARTERIOVENOUS (AV) FISTULA CREATION
Anesthesia: Monitor Anesthesia Care | Site: Arm Upper | Laterality: Left

## 2021-05-27 MED ORDER — OXYCODONE HCL 5 MG/5ML PO SOLN: 5.0000 mg | Freq: Once | ORAL | Status: DC | PRN

## 2021-05-27 MED ORDER — OXYCODONE HCL 5 MG PO TABS: 5.0000 mg | ORAL_TABLET | Freq: Once | ORAL | Status: DC | PRN

## 2021-05-27 MED ORDER — EPHEDRINE SULFATE-NACL 50-0.9 MG/10ML-% IV SOSY
PREFILLED_SYRINGE | INTRAVENOUS | Status: DC | PRN
  Administered 2021-05-27 (×2): 5 mg via INTRAVENOUS
  Administered 2021-05-27: 10 mg via INTRAVENOUS

## 2021-05-27 MED ORDER — FENTANYL CITRATE (PF) 100 MCG/2ML IJ SOLN: 25.0000 ug | INTRAMUSCULAR | Status: DC | PRN

## 2021-05-27 MED ORDER — OXYCODONE-ACETAMINOPHEN 5-325 MG PO TABS: 1.0000 | ORAL_TABLET | Freq: Four times a day (QID) | ORAL | 0 refills | Status: DC | PRN

## 2021-05-27 MED ORDER — FENTANYL CITRATE (PF) 100 MCG/2ML IJ SOLN: 50.0000 ug | Freq: Once | INTRAMUSCULAR | Status: AC

## 2021-05-27 MED ORDER — LIDOCAINE 2% (20 MG/ML) 5 ML SYRINGE
INTRAMUSCULAR | Status: DC | PRN
Start: 2021-05-27 — End: 2021-05-27
  Administered 2021-05-27: 60 mg via INTRAVENOUS

## 2021-05-27 MED ORDER — PROPOFOL 10 MG/ML IV BOLUS
INTRAVENOUS | Status: AC
  Filled 2021-05-27: qty 20

## 2021-05-27 MED ORDER — CHLORHEXIDINE GLUCONATE CLOTH 2 % EX PADS: 6.0000 | MEDICATED_PAD | Freq: Once | CUTANEOUS | Status: DC

## 2021-05-27 MED ORDER — PROPOFOL 10 MG/ML IV BOLUS
INTRAVENOUS | Status: DC | PRN
Start: 2021-05-27 — End: 2021-05-27
  Administered 2021-05-27: 30 mg via INTRAVENOUS

## 2021-05-27 MED ORDER — PROPOFOL 500 MG/50ML IV EMUL
INTRAVENOUS | Status: DC | PRN
  Administered 2021-05-27: 50 ug/kg/min via INTRAVENOUS

## 2021-05-27 MED ORDER — LIDOCAINE HCL (PF) 1 % IJ SOLN
INTRAMUSCULAR | Status: AC
  Filled 2021-05-27: qty 30

## 2021-05-27 MED ORDER — FENTANYL CITRATE (PF) 250 MCG/5ML IJ SOLN
INTRAMUSCULAR | Status: AC
  Filled 2021-05-27: qty 5

## 2021-05-27 MED ORDER — 0.9 % SODIUM CHLORIDE (POUR BTL) OPTIME
TOPICAL | Status: DC | PRN
  Administered 2021-05-27: 1000 mL

## 2021-05-27 MED ORDER — ACETAMINOPHEN 500 MG PO TABS
1000.0000 mg | ORAL_TABLET | Freq: Once | ORAL | Status: AC
  Administered 2021-05-27: 1000 mg via ORAL
  Filled 2021-05-27: qty 2

## 2021-05-27 MED ORDER — FENTANYL CITRATE (PF) 100 MCG/2ML IJ SOLN
INTRAMUSCULAR | Status: AC
  Administered 2021-05-27: 50 ug via INTRAVENOUS
  Filled 2021-05-27: qty 2

## 2021-05-27 MED ORDER — HEPARIN 6000 UNIT IRRIGATION SOLUTION
Status: AC
  Filled 2021-05-27: qty 500

## 2021-05-27 MED ORDER — CHLORHEXIDINE GLUCONATE 0.12 % MT SOLN
15.0000 mL | Freq: Once | OROMUCOSAL | Status: AC
  Administered 2021-05-27: 15 mL via OROMUCOSAL
  Filled 2021-05-27: qty 15

## 2021-05-27 MED ORDER — MEPIVACAINE HCL (PF) 2 % IJ SOLN
INTRAMUSCULAR | Status: DC | PRN
  Administered 2021-05-27: 20 mL

## 2021-05-27 MED ORDER — EPHEDRINE 5 MG/ML INJ
INTRAVENOUS | Status: AC
  Filled 2021-05-27: qty 5

## 2021-05-27 MED ORDER — CEFAZOLIN SODIUM-DEXTROSE 2-4 GM/100ML-% IV SOLN
2.0000 g | INTRAVENOUS | Status: AC
  Administered 2021-05-27: 2 g via INTRAVENOUS
  Filled 2021-05-27: qty 100

## 2021-05-27 MED ORDER — HEPARIN 6000 UNIT IRRIGATION SOLUTION
Status: DC | PRN
  Administered 2021-05-27: 1

## 2021-05-27 MED ORDER — PHENYLEPHRINE HCL-NACL 20-0.9 MG/250ML-% IV SOLN
INTRAVENOUS | Status: DC | PRN
  Administered 2021-05-27: 20 ug/min via INTRAVENOUS

## 2021-05-27 MED ORDER — PHENYLEPHRINE 40 MCG/ML (10ML) SYRINGE FOR IV PUSH (FOR BLOOD PRESSURE SUPPORT)
PREFILLED_SYRINGE | INTRAVENOUS | Status: DC | PRN
Start: 2021-05-27 — End: 2021-05-27
  Administered 2021-05-27: 80 ug via INTRAVENOUS

## 2021-05-27 MED ORDER — MIDAZOLAM HCL 2 MG/2ML IJ SOLN
INTRAMUSCULAR | Status: AC
  Filled 2021-05-27: qty 2

## 2021-05-27 MED ORDER — PHENYLEPHRINE 40 MCG/ML (10ML) SYRINGE FOR IV PUSH (FOR BLOOD PRESSURE SUPPORT)
PREFILLED_SYRINGE | INTRAVENOUS | Status: AC
  Filled 2021-05-27: qty 10

## 2021-05-27 MED ORDER — SODIUM CHLORIDE 0.9 % IV SOLN: INTRAVENOUS | Status: DC

## 2021-05-27 MED ORDER — HEPARIN SODIUM (PORCINE) 1000 UNIT/ML IJ SOLN
INTRAMUSCULAR | Status: DC | PRN
  Administered 2021-05-27: 2000 [IU] via INTRAVENOUS

## 2021-05-27 MED ORDER — ONDANSETRON HCL 4 MG/2ML IJ SOLN
INTRAMUSCULAR | Status: AC
  Filled 2021-05-27: qty 2

## 2021-05-27 MED ORDER — LIDOCAINE 2% (20 MG/ML) 5 ML SYRINGE
INTRAMUSCULAR | Status: AC
  Filled 2021-05-27: qty 5

## 2021-05-27 MED ORDER — ORAL CARE MOUTH RINSE: 15.0000 mL | Freq: Once | OROMUCOSAL | Status: AC

## 2021-05-27 MED ORDER — ONDANSETRON HCL 4 MG/2ML IJ SOLN
INTRAMUSCULAR | Status: DC | PRN
Start: 2021-05-27 — End: 2021-05-27
  Administered 2021-05-27: 4 mg via INTRAVENOUS

## 2021-05-27 MED ORDER — ONDANSETRON HCL 4 MG/2ML IJ SOLN: 4.0000 mg | Freq: Once | INTRAMUSCULAR | Status: DC | PRN

## 2021-05-27 SURGICAL SUPPLY — 38 items
ARMBAND PINK RESTRICT EXTREMIT (MISCELLANEOUS) ×2 IMPLANT
BAG COUNTER SPONGE SURGICOUNT (BAG) ×2 IMPLANT
BLADE CLIPPER SURG (BLADE) ×2 IMPLANT
BNDG ELASTIC 4X5.8 VLCR STR LF (GAUZE/BANDAGES/DRESSINGS) ×1 IMPLANT
BRUSH SCRUB EZ PLAIN DRY (MISCELLANEOUS) ×1 IMPLANT
CANISTER SUCT 3000ML PPV (MISCELLANEOUS) ×2 IMPLANT
CLIP LIGATING EXTRA MED SLVR (CLIP) ×2 IMPLANT
CLIP LIGATING EXTRA SM BLUE (MISCELLANEOUS) ×2 IMPLANT
CLIP VESOCCLUDE MED 6/CT (CLIP) ×1 IMPLANT
COVER PROBE W GEL 5X96 (DRAPES) ×2 IMPLANT
DECANTER SPIKE VIAL GLASS SM (MISCELLANEOUS) ×2 IMPLANT
DERMABOND ADVANCED (GAUZE/BANDAGES/DRESSINGS) ×1
DERMABOND ADVANCED .7 DNX12 (GAUZE/BANDAGES/DRESSINGS) ×1 IMPLANT
ELECT REM PT RETURN 9FT ADLT (ELECTROSURGICAL) ×2
ELECTRODE REM PT RTRN 9FT ADLT (ELECTROSURGICAL) ×1 IMPLANT
GAUZE 4X4 16PLY ~~LOC~~+RFID DBL (SPONGE) ×1 IMPLANT
GLOVE SRG 8 PF TXTR STRL LF DI (GLOVE) ×2 IMPLANT
GLOVE SURG POLYISO LF SZ8 (GLOVE) IMPLANT
GLOVE SURG UNDER POLY LF SZ8 (GLOVE) ×4
GOWN STRL REUS W/ TWL LRG LVL3 (GOWN DISPOSABLE) ×2 IMPLANT
GOWN STRL REUS W/TWL 2XL LVL3 (GOWN DISPOSABLE) ×2 IMPLANT
GOWN STRL REUS W/TWL LRG LVL3 (GOWN DISPOSABLE) ×4
KIT BASIN OR (CUSTOM PROCEDURE TRAY) ×2 IMPLANT
KIT TURNOVER KIT B (KITS) ×2 IMPLANT
NS IRRIG 1000ML POUR BTL (IV SOLUTION) ×2 IMPLANT
PACK CV ACCESS (CUSTOM PROCEDURE TRAY) ×2 IMPLANT
PAD ARMBOARD 7.5X6 YLW CONV (MISCELLANEOUS) ×4 IMPLANT
SPONGE T-LAP 18X18 ~~LOC~~+RFID (SPONGE) ×1 IMPLANT
SUT MNCRL AB 4-0 PS2 18 (SUTURE) ×2 IMPLANT
SUT PROLENE 6 0 BV (SUTURE) ×2 IMPLANT
SUT PROLENE 7 0 BV 1 (SUTURE) IMPLANT
SUT SILK 2 0 SH (SUTURE) ×2 IMPLANT
SUT SILK 3 0 SH CR/8 (SUTURE) ×1 IMPLANT
SUT VIC AB 3-0 SH 27 (SUTURE) ×2
SUT VIC AB 3-0 SH 27X BRD (SUTURE) ×1 IMPLANT
TOWEL GREEN STERILE (TOWEL DISPOSABLE) ×2 IMPLANT
UNDERPAD 30X36 HEAVY ABSORB (UNDERPADS AND DIAPERS) ×2 IMPLANT
WATER STERILE IRR 1000ML POUR (IV SOLUTION) ×2 IMPLANT

## 2021-05-27 NOTE — Op Note (Signed)
° ° °  NAME: Brittany Charles    MRN: [id number] DOB: [birth year]    DATE OF OPERATION: 05/27/2021  PREOP DIAGNOSIS:    Stage V chronic kidney disease  POSTOP DIAGNOSIS:    Same  PROCEDURE:    Left-sided brachiocephalic fistula creation  SURGEON: Broadus John  ASSIST: None  ANESTHESIA: Moderate, block  EBL: 5 mL  INDICATIONS:    ARAIYAH [name] is a 71 year old right-handed female with stage V chronic kidney disease.  She was recently seen in our office with need for long-term HD access.  Vein mapping demonstrated adequate vein for brachiocephalic or brachiobasilic fistula creation in either extremity.  Being that she is right-handed, we discussed left-sided brachiocephalic fistula.  The patient's arm was large, therefore she may require superficialization at a later date.  After discussing the risks and benefits, as well as possible need for later superficialization, she elected to proceed.  FINDINGS:   5 mm median antecubital vein 5 mm brachial artery  TECHNIQUE:   A left upper extremity block was performed by the anesthesia team. The patient was brought to the operating room and placed in supine position. The left arm was prepped and draped in standard fashion. IV antibiotics were administered. A timeout was performed.   The case began with ultrasound insonation of the brachial artery and cephalic vein, which demonstrated sufficient size at the antecubital fossa for arteriovenous fistula.   A transverse incision was made below the elbow creese in the antecubital fossa. The cephalic vein and median antecubital vein was isolated for 3 cm in length. Next the aponeurosis was partially released and the brachial artery secured with a vessel loop. The patient was heparinized. The median antecubital vein was transected and ligated distally with a 2-0 silk stick-tie. The vein was dilated with coronary dilators and flushed with heparin saline. Vascular clamps were placed proximally  and distally on the brachial artery and a 5 mm arteriotomy was created on the brachial artery. This was flushed with heparin saline.  An anastomosis was created in end to side fashion on the brachial artery using running 6-0 Prolene suture.  Prior to completing the anastomosis, the vessels were flushed and the suture line was tied down.  Being that the median antecubital vein was used for the anastomosis, the distal portion of the cephalic vein was ligated to stop competitive flow.  There was significant sclerosis from multiple IV attempts, however fibrosis was removed from the artery and excellent dilation of the cephalic vein was appreciated.  There was an excellent thrill in the cephalic vein from the anastomosis to the mid upper bicipital region. The patient had a multiphasic radial and ulnar signal. He had an excellent doppler signal in the fistula. The incision was irrigated and hemostasis achieved with cautery and suture. The deeper tissue was closed with 3-0 Vicryl and the skin closed with 4-0 Monocryl.  Dermabond was applied to the incisions. The patient was transferred to PACU in stable condition.  Macie Burows, MD [facility]  DATE OF DICTATION:   05/27/2021

## 2021-05-27 NOTE — Discharge Instructions (Signed)
[facility]  Discharge Instructions  AV Fistula or Graft Surgery for Dialysis Access  Please refer to the following instructions for your post-procedure care. Your surgeon or physician assistant will discuss any changes with you.  Activity  You may drive the day following your surgery, if you are comfortable and no longer taking prescription pain medication. Resume full activity as the soreness in your incision resolves.  Bathing/Showering  You may shower after you go home. Keep your incision dry for 48 hours. Do not soak in a bathtub, hot tub, or swim until the incision heals completely. You may not shower if you have a hemodialysis catheter.  Incision Care  Clean your incision with mild soap and water after 48 hours. Pat the area dry with a clean towel. You do not need a bandage unless otherwise instructed. Do not apply any ointments or creams to your incision. You may have skin glue on your incision. Do not peel it off. It will come off on its own in about one week. Your arm may swell a bit after surgery. To reduce swelling use pillows to elevate your arm so it is above your heart. Your doctor will tell you if you need to lightly wrap your arm with an ACE bandage.  Diet  Resume your normal diet. There are not special food restrictions following this procedure. In order to heal from your surgery, it is CRITICAL to get adequate nutrition. Your body requires vitamins, minerals, and protein. Vegetables are the best source of vitamins and minerals. Vegetables also provide the perfect balance of protein. Processed food has little nutritional value, so try to avoid this.  Medications  Resume taking all of your medications. If your incision is causing pain, you may take over-the counter pain relievers such as acetaminophen (Tylenol). If you were prescribed a stronger pain medication, please be aware these medications can cause nausea and constipation. Prevent  nausea by taking the medication with a snack or meal. Avoid constipation by drinking plenty of fluids and eating foods with high amount of fiber, such as fruits, vegetables, and grains.  Do not take Tylenol if you are taking prescription pain medications.  Follow up Your surgeon may want to see you in the office following your access surgery. If so, this will be arranged at the time of your surgery.  Please call us immediately for any of the following conditions:  Increased pain, redness, drainage (pus) from your incision site Fever of 101 degrees or higher Severe or worsening pain at your incision site Hand pain or numbness.  Reduce your risk of vascular disease:  Stop smoking. If you would like help, call [facility] at 1-800-QUIT-NOW 629-719-5688) or [place] at [place] your cholesterol Maintain a desired weight Control your diabetes Keep your blood pressure down  Dialysis  It will take several weeks to several months for your new dialysis access to be ready for use. Your surgeon will determine when it is okay to use it. Your nephrologist will continue to direct your dialysis. You can continue to use your Permcath until your new access is ready for use.   05/27/2021 Brittany Charles [id number] November 09, 1950  Surgeon(s): Broadus John, MD  Procedure(s): LEFT BRACHIOCEPHALIC ARTERIOVENOUS (AV) FISTULA CREATION   May stick graft immediately   May stick graft on designated area only:   X Do not stick left AV fistula for 12 weeks    If you have any questions, please call the office  at [phone].

## 2021-05-27 NOTE — Transfer of Care (Signed)
Immediate Anesthesia Transfer of Care Note  Patient: Brittany Charles  Procedure(s) Performed: LEFT BRACHIOCEPHALIC ARTERIOVENOUS (AV) FISTULA CREATION (Left: Arm Upper)  Patient Location: PACU  Anesthesia Type:MAC and Regional  Level of Consciousness: awake and alert   Airway & Oxygen Therapy: Patient Spontanous Breathing  Post-op Assessment: Report given to RN and Post -op Vital signs reviewed and stable  Post vital signs: Reviewed and unstable  Last Vitals:  Vitals Value Taken Time  BP 119/50 05/27/21 1035  Temp    Pulse 70 05/27/21 1038  Resp 20 05/27/21 1038  SpO2 99 % 05/27/21 1038  Vitals shown include unvalidated device data.  Last Pain:  Vitals:   05/27/21 0741  TempSrc:   PainSc: 0-No pain         Complications: No notable events documented.

## 2021-05-27 NOTE — Anesthesia Postprocedure Evaluation (Signed)
Anesthesia Post Note  Patient: Brittany Charles  Procedure(s) Performed: LEFT BRACHIOCEPHALIC ARTERIOVENOUS (AV) FISTULA CREATION (Left: Arm Upper)     Patient location during evaluation: PACU Anesthesia Type: Regional Level of consciousness: awake and alert Pain management: pain level controlled Vital Signs Assessment: post-procedure vital signs reviewed and stable Respiratory status: spontaneous breathing, nonlabored ventilation and respiratory function stable Cardiovascular status: stable and blood pressure returned to baseline Anesthetic complications: no   No notable events documented.  Last Vitals:  Vitals:   05/27/21 1050 05/27/21 1105  BP: (!) 132/52 (!) 131/54  Pulse: 67 70  Resp: 17 20  Temp:  36.7 C  SpO2: 100% 99%    Last Pain:  Vitals:   05/27/21 1105  TempSrc:   PainSc: 0-No pain                 Audry Pili

## 2021-05-27 NOTE — H&P (Signed)
Office Note   Patient seen and examined in preop holding.  No complaints. No changes to medication history or physical exam since last seen in clinic. After discussing the risks and benefits of left brachiocephalic fistula, Brittany Charles elected to proceed.   Brittany John MD   CC:  ESRD Requesting Provider:  No ref. provider found  HPI: Brittany Charles is a Right handed 71 y.o. (08-31-1950) female with kidney disease who presents at the request of No ref. provider found for permanent HD access. Past medical history is significant for lupus, and remote lupus nephritis requiring treatment-fairly advanced CKD stage IV/V followed by Dr. Johnney Ou at [facility]. Patient has initiated the transplant evaluation and has also been educated regarding dialysis planning.  The patient has had no prior access procedures, no lines, not currently on dialysis. She worked at the [employer] office for 30 years prior to retiring.  She has 1 son, several grandchildren and 4 great-grandchildren whom she spends time with on a regular basis.  On exam today, Brittany Charles was doing well.  He had no complaints.  He is aware that she will need hemodialysis soon, but was not educated on the process or fistula creation.  The pt is  on a statin for cholesterol management.  The pt is - on a daily aspirin.   Other AC:  eliquis - afib The pt is  on medications for hypertension.   The pt is not diabetic. Tobacco hx:  -  Past Medical History:  Diagnosis Date   Anemia    Arthritis    CHF (congestive heart failure) (HCC)    Fibroids    GERD (gastroesophageal reflux disease)    Hypertension    Lupus (systemic lupus erythematosus) ([place])    Myocardial infarction ([place])    Pneumonia    Renal insufficiency     Past Surgical History:  Procedure Laterality Date   ABDOMINAL HYSTERECTOMY  10/27/2000   TAH, BSO   LEFT AND RIGHT HEART CATHETERIZATION WITH CORONARY ANGIOGRAM Right 04/21/2011   Procedure: LEFT AND RIGHT  HEART CATHETERIZATION WITH CORONARY ANGIOGRAM;  Surgeon: [name], MD;  Location[facility] C[facility] LAB;  Service: Cardiovascular;  Laterality: Right;   TONSILLECTOMY      Social History   Socioeconomic History   Marital status: Single    Spouse name: Not on file   Number of children: Not on file   Years of education: Not on file   Highest education level: Not on file  Occupational History   Not on file  Tobacco Use   Smoking status: Never   Smokeless tobacco: Never  Vaping Use   Vaping Use: Never used  Substance and Sexual Activity   Alcohol use: No   Drug use: No   Sexual activity: Never  Other Topics Concern   Not on file  Social History Narrative   Not on file   Social Determinants of Health   Financial Resource Strain: Not on file  Food Insecurity: Not on file  Transportation Needs: Not on file  Physical Activity: Not on file  Stress: Not on file  Social Connections: Not on file  Intimate Partner Violence: Not on file    Family History  Problem Relation Age of Onset   Hypertension Mother    Heart disease Mother        CHF   Stroke Mother    Hyperlipidemia Father    Stroke Father    Hypertension Father    Stroke Maternal Grandmother  Colon cancer Unknown    Breast cancer Unknown     Current Facility-Administered Medications  Medication Dose Route Frequency Provider Last Rate Last Admin   0.9 %  sodium chloride infusion   Intravenous Continuous Brittany John, MD       0.9 %  sodium chloride infusion   Intravenous Continuous Audry Pili, MD 10 mL/hr at 05/27/21 0751 New Bag at 05/27/21 0751   ceFAZolin (ANCEF) IVPB 2g/100 mL premix  2 g Intravenous 30 min Pre-Op Brittany John, MD       Chlorhexidine Gluconate Cloth 2 % PADS 6 each  6 each Topical Once Brittany John, MD       And   Chlorhexidine Gluconate Cloth 2 % PADS 6 each  6 each Topical Once Brittany John, MD        Allergies  Allergen Reactions   Zithromax [Azithromycin] Nausea  And Vomiting     REVIEW OF SYSTEMS:   [X]  denotes positive finding, [ ]  denotes negative finding Cardiac  Comments:  Chest pain or chest pressure:    Shortness of breath upon exertion:    Short of breath when lying flat:    Irregular heart rhythm:        Vascular    Pain in calf, thigh, or hip brought on by ambulation:    Pain in feet at night that wakes you up from your sleep:     Blood clot in your veins:    Leg swelling:         Pulmonary    Oxygen at home:    Productive cough:     Wheezing:         Neurologic    Sudden weakness in arms or legs:     Sudden numbness in arms or legs:     Sudden onset of difficulty speaking or slurred speech:    Temporary loss of vision in one eye:     Problems with dizziness:         Gastrointestinal    Blood in stool:     Vomited blood:         Genitourinary    Burning when urinating:     Blood in urine:        Psychiatric    Major depression:         Hematologic    Bleeding problems:    Problems with blood clotting too easily:        Skin    Rashes or ulcers:        Constitutional    Fever or chills:      PHYSICAL EXAMINATION:  Vitals:   05/27/21 0731  BP: (!) 169/46  Pulse: 68  Resp: 18  Temp: 98.2 F (36.8 C)  TempSrc: Oral  SpO2: 99%  Weight: 87.1 kg  Height: 5\' 5"  (1.651 m)    General:  WDWN in NAD; vital signs documented above Gait: Not observed HENT: WNL, normocephalic Pulmonary: normal non-labored breathing , without Rales, rhonchi,  wheezing Cardiac: regular HR,  Abdomen: soft, NT, no masses Skin: without rashes Vascular Exam/Pulses:  Right Left  Radial 2+ (normal) 2+ (normal)  Ulnar 2+ (normal) 2+ (normal)                   Extremities: without ischemic changes, without Gangrene , without cellulitis; without open wounds;  Musculoskeletal: no muscle wasting or atrophy  Neurologic: A&O X 3;  No focal weakness or paresthesias are detected  Psychiatric:  The pt has Normal  affect.   Non-Invasive Vascular Imaging:    +-----------------+-------------+----------+-----------------+   Right Cephalic    Diameter (cm) Depth (cm)     Findings        +-----------------+-------------+----------+-----------------+   Shoulder              0.45                                     +-----------------+-------------+----------+-----------------+   Prox upper arm        0.51                                     +-----------------+-------------+----------+-----------------+   Mid upper arm         0.44                                     +-----------------+-------------+----------+-----------------+   Dist upper arm        0.49                                     +-----------------+-------------+----------+-----------------+   Antecubital fossa     0.53                                     +-----------------+-------------+----------+-----------------+   Prox forearm          0.13                 non compressible    +-----------------+-------------+----------+-----------------+   Mid forearm           0.09                 chronic thrombus?   +-----------------+-------------+----------+-----------------+   Dist forearm          0.14                                     +-----------------+-------------+----------+-----------------+   +-----------------+-------------+----------+--------+   Right Basilic     Diameter (cm) Depth (cm) Findings   +-----------------+-------------+----------+--------+   Prox upper arm        0.45                            +-----------------+-------------+----------+--------+   Mid upper arm         0.46                            +-----------------+-------------+----------+--------+   Dist upper arm        0.42                            +-----------------+-------------+----------+--------+   Antecubital fossa     0.38                            +-----------------+-------------+----------+--------+   Prox forearm  0.42                             +-----------------+-------------+----------+--------+   +-----------------+-------------+----------+--------+   Left Cephalic     Diameter (cm) Depth (cm) Findings   +-----------------+-------------+----------+--------+   Shoulder              0.31                            +-----------------+-------------+----------+--------+   Prox upper arm        0.35                            +-----------------+-------------+----------+--------+   Mid upper arm         0.38                            +-----------------+-------------+----------+--------+   Dist upper arm        0.43                            +-----------------+-------------+----------+--------+   Antecubital fossa     0.40                            +-----------------+-------------+----------+--------+   Prox forearm          0.23                            +-----------------+-------------+----------+--------+   Mid forearm           0.27                            +-----------------+-------------+----------+--------+   Dist forearm          0.22                            +-----------------+-------------+----------+--------+   +-----------------+-------------+----------+--------+   Left Basilic      Diameter (cm) Depth (cm) Findings   +-----------------+-------------+----------+--------+   Prox upper arm        0.45                            +-----------------+-------------+----------+--------+   Mid upper arm         0.38                            +-----------------+-------------+----------+--------+   Dist upper arm        0.34                            +-----------------+-------------+----------+--------+   Antecubital fossa     0.29                            +-----------------+-------------+----------+--------+   Prox forearm          0.21                            +-----------------+-------------+----------+--------+  ASSESSMENT/PLAN:  SAFIYA GIRDLER is a 71 y.o. female who presents with  chronic kidney disease stage 5  Based on vein mapping and examination, there is suitable cephalic and basilic veins bilaterally for fistula creation.  Being that she is right-handed, she would be best suited for left brachiocephalic fistula creation. I had an extensive discussion with this patient in regards to the nature of access surgery, including risk, benefits, and alternatives.   The patient is aware that the risks of access surgery include but are not limited to: bleeding, infection, steal syndrome, nerve damage, ischemic monomelic neuropathy, failure of access to mature, complications related to venous hypertension, and possible need for additional access procedures in the future. I discussed with the patient the nature of the staged access procedure, specifically the need for a second operation to transpose the first stage fistula if it matures adequately.   The patient has  agreed to proceed with the above procedure which will be scheduled in the coming weeks.  Brittany John, MD [facility] [phone]

## 2021-05-27 NOTE — Anesthesia Procedure Notes (Signed)
Procedure Name: MAC Date/Time: 05/27/2021 9:26 AM Performed by: Michele Rockers, CRNA Pre-anesthesia Checklist: Patient identified, Emergency Drugs available, Suction available, Timeout performed and Patient being monitored Patient Re-evaluated:Patient Re-evaluated prior to induction Oxygen Delivery Method: Simple face mask

## 2021-05-27 NOTE — Anesthesia Procedure Notes (Signed)
Anesthesia Regional Block: Supraclavicular block   Pre-Anesthetic Checklist: , timeout performed,  Correct Patient, Correct Site, Correct Laterality,  Correct Procedure, Correct Position, site marked,  Risks and benefits discussed,  Surgical consent,  Pre-op evaluation,  At surgeon's request and post-op pain management  Laterality: Left  Prep: chloraprep       Needles:  Injection technique: Single-shot  Needle Type: Echogenic Needle     Needle Length: 5cm  Needle Gauge: 21     Additional Needles:   Narrative:  Start time: 05/27/2021 8:45 AM End time: 05/27/2021 8:49 AM Injection made incrementally with aspirations every 5 mL.  Performed by: Personally  Anesthesiologist: Audry Pili, MD  Additional Notes: No pain on injection. No increased resistance to injection. Injection made in 5cc increments. Good needle visualization. Patient tolerated the procedure well.

## 2021-05-28 ENCOUNTER — Encounter (HOSPITAL_COMMUNITY): Payer: Self-pay | Admitting: Vascular Surgery

## 2021-06-20 ENCOUNTER — Other Ambulatory Visit: Payer: Self-pay

## 2021-06-20 ENCOUNTER — Other Ambulatory Visit (HOSPITAL_COMMUNITY): Payer: Self-pay | Admitting: *Deleted

## 2021-06-20 ENCOUNTER — Ambulatory Visit (HOSPITAL_COMMUNITY)
Admission: RE | Admit: 2021-06-20 | Discharge: 2021-06-20 | Disposition: A | Discharge status: Home / Self Care | Payer: Medicare HMO | Source: Ambulatory Visit | Attending: Internal Medicine | Admitting: Internal Medicine

## 2021-06-20 VITALS — BP 157/50 | HR 73 | Temp 97.2°F | Resp 18

## 2021-06-20 DIAGNOSIS — N185 Chronic kidney disease, stage 5: Secondary | ICD-10-CM | POA: Insufficient documentation

## 2021-06-20 LAB — FERRITIN: Ferritin: 33 ng/mL (ref 11–307)

## 2021-06-20 LAB — IRON AND TIBC
Iron: 36 ug/dL (ref 28–170)
Saturation Ratios: 16 % (ref 10.4–31.8)
TIBC: 231 ug/dL — ABNORMAL LOW (ref 250–450)
UIBC: 195 ug/dL

## 2021-06-20 LAB — POCT HEMOGLOBIN-HEMACUE: Hemoglobin: 9.7 g/dL — ABNORMAL LOW (ref 12.0–15.0)

## 2021-06-20 MED ORDER — EPOETIN ALFA-EPBX 10000 UNIT/ML IJ SOLN
10000.0000 [IU] | INTRAMUSCULAR | Status: DC
  Administered 2021-06-20: 10000 [IU] via SUBCUTANEOUS

## 2021-06-20 MED ORDER — EPOETIN ALFA-EPBX 10000 UNIT/ML IJ SOLN
INTRAMUSCULAR | Status: AC
  Filled 2021-06-20: qty 1

## 2021-06-26 ENCOUNTER — Ambulatory Visit: Payer: Medicare HMO | Admitting: Gastroenterology

## 2021-06-26 ENCOUNTER — Encounter: Payer: Self-pay | Admitting: Gastroenterology

## 2021-06-26 VITALS — BP 136/50 | HR 68 | Ht 64.5 in | Wt 198.2 lb

## 2021-06-26 DIAGNOSIS — N186 End stage renal disease: Secondary | ICD-10-CM | POA: Diagnosis not present

## 2021-06-26 DIAGNOSIS — Z7682 Awaiting organ transplant status: Secondary | ICD-10-CM | POA: Diagnosis not present

## 2021-06-26 DIAGNOSIS — Z992 Dependence on renal dialysis: Secondary | ICD-10-CM | POA: Diagnosis not present

## 2021-06-26 DIAGNOSIS — Z86010 Personal history of colonic polyps: Secondary | ICD-10-CM

## 2021-06-26 NOTE — Patient Instructions (Signed)
You have been scheduled for a colonoscopy at [facility], Separate instructions have been given    Due to recent changes in healthcare laws, you may see the results of your imaging and laboratory studies on MyChart before your provider has had a chance to review them.  We understand that in some cases there may be results that are confusing or concerning to you. Not all laboratory results come back in the same time frame and the provider may be waiting for multiple results in order to interpret others.  Please give [place] 48 hours in order for your provider to thoroughly review all the results before contacting the office for clarification of your results.    If you are age 64 or older, your body mass index should be between 23-30. Your Body mass index is 33.5 kg/m. If this is out of the aforementioned range listed, please consider follow up with your Primary Care Provider.  If you are age 54 or younger, your body mass index should be between 19-25. Your Body mass index is 33.5 kg/m. If this is out of the aformentioned range listed, please consider follow up with your Primary Care Provider.   ________________________________________________________  The [facility] providers would like to encourage you to use [facility] to communicate with providers for non-urgent requests or questions.  Due to long hold times on the telephone, sending your provider a message by [facility] may be a faster and more efficient way to get a response.  Please allow 48 business hours for a response.  Please remember that this is for non-urgent requests.  _______________________________________________________   I appreciate the  opportunity to care for you  Thank You   Harl Bowie , MD

## 2021-06-26 NOTE — Progress Notes (Signed)
Brittany Charles    [id number]    1950-06-27  Primary Care Physician:Greene, Ranell Patrick, MD  Referring Physician: Wendie Agreste, MD [address] [place],  [place] [address]   Chief complaint:  H/o colon polyps  HPI:  71 yr old very pleasant female here to discuss surveillance colonoscopy for history of adenomatous colon polyps  She has history of CAD, CHF, hypertension, lupus and end-stage renal disease.  She is on chronic anticoagulation with Eliquis.  Denies any nausea, vomiting, abdominal pain, melena or bright red blood per rectum   Colonoscopy 03/09/2017 - Three 2 to 3 mm polyps in the transverse colon, in the ascending colon and in the cecum, removed with a cold biopsy forceps. Resected and retrieved. - Two 4 to 7 mm polyps in the descending colon and in the ascending colon, removed with a cold snare. Resected and retrieved. - Seven 10 to 20 mm polyps in the descending colon, in the transverse colon and in the ascending colon, removed with a hot snare. Resected and retrieved. - Non-bleeding internal hemorrhoids.  All the polyps removed were tubular adenomas   Outpatient Encounter Medications as of 06/26/2021  Medication Sig   acetaminophen (TYLENOL) 325 MG tablet Take 650 mg by mouth every 6 (six) hours as needed for moderate pain.   albuterol (VENTOLIN HFA) 108 (90 Base) MCG/ACT inhaler Inhale 2 puffs into the lungs every 6 (six) hours as needed for wheezing or shortness of breath.   allopurinol (ZYLOPRIM) 100 MG tablet TAKE 1 TABLET EVERY DAY   apixaban (ELIQUIS) 5 MG TABS tablet Take 1 tablet (5 mg total) by mouth 2 (two) times daily.   ascorbic acid (VITAMIN C) 500 MG tablet Take 1 tablet (500 mg total) by mouth daily.   aspirin EC 81 MG tablet Take 81 mg by mouth daily. Swallow whole.   calcitRIOL (ROCALTROL) 0.25 MCG capsule Take 0.25 mcg by mouth daily.   DILT-XR 180 MG 24 hr capsule Take 180 mg by mouth daily.   doxazosin (CARDURA) 1 MG  tablet Take 2 mg by mouth at bedtime.   hydrALAZINE (APRESOLINE) 50 MG tablet TAKE 1 TABLET THREE TIMES DAILY   metoprolol succinate (TOPROL-XL) 25 MG 24 hr tablet Take 1 tablet (25 mg total) by mouth daily. Take with or immediately following a meal.   predniSONE (DELTASONE) 50 MG tablet Take 25 mg by mouth daily as needed (gout flare).   rosuvastatin (CRESTOR) 20 MG tablet Take 1 tablet (20 mg total) by mouth daily.   sodium bicarbonate 650 MG tablet Take 650 mg by mouth 2 (two) times daily.   zinc sulfate 220 (50 Zn) MG capsule Take 1 capsule (220 mg total) by mouth daily.   furosemide (LASIX) 80 MG tablet Take 80 mg by mouth daily as needed (swelling). (Patient not taking: Reported on 06/26/2021)   [DISCONTINUED] oxyCODONE-acetaminophen (PERCOCET) 5-325 MG tablet Take 1 tablet by mouth every 6 (six) hours as needed for severe pain.   No facility-administered encounter medications on file as of 06/26/2021.    Allergies as of 06/26/2021 - Review Complete 06/26/2021  Allergen Reaction Noted   Zithromax [azithromycin] Nausea And Vomiting 04/17/2011    Past Medical History:  Diagnosis Date   Anemia    Arthritis    CHF (congestive heart failure) (HCC)    Colon polyps    Fibroids    GERD (gastroesophageal reflux disease)    Hypertension  Lupus (systemic lupus erythematosus) ([place])    Myocardial infarction ([place])    Pneumonia    Renal insufficiency     Past Surgical History:  Procedure Laterality Date   ABDOMINAL HYSTERECTOMY  10/27/2000   TAH, BSO   AV FISTULA PLACEMENT Left 05/27/2021   Procedure: LEFT BRACHIOCEPHALIC ARTERIOVENOUS (AV) FISTULA CREATION;  Surgeon: Broadus John, MD;  Location: [facility] OR;  Service: Vascular;  Laterality: Left;  with peripheral nerve block   LEFT AND RIGHT HEART CATHETERIZATION WITH CORONARY ANGIOGRAM Right 04/21/2011   Procedure: LEFT AND RIGHT HEART CATHETERIZATION WITH CORONARY ANGIOGRAM;  Surgeon: [name], MD;  Location: [facility] CATH LAB;   Service: Cardiovascular;  Laterality: Right;   TONSILLECTOMY      Family History  Problem Relation Age of Onset   Hypertension Mother    Heart disease Mother        CHF   Stroke Mother    Diabetes Mother    Hyperlipidemia Father    Stroke Father    Hypertension Father    Asthma Brother    Kidney disease Brother    Stroke Maternal Grandmother    Colon cancer Other        several cousins   Breast cancer Other    Colon cancer Son    Hypertension Son     Social History   Socioeconomic History   Marital status: Single    Spouse name: Not on file   Number of children: 1   Years of education: Not on file   Highest education level: Not on file  Occupational History   Occupation: retired Therapist, sports  Tobacco Use   Smoking status: Never   Smokeless tobacco: Never  Vaping Use   Vaping Use: Never used  Substance and Sexual Activity   Alcohol use: No   Drug use: No   Sexual activity: Never  Other Topics Concern   Not on file  Social History Narrative   Not on file   Social Determinants of Health   Financial Resource Strain: Not on file  Food Insecurity: Not on file  Transportation Needs: Not on file  Physical Activity: Not on file  Stress: Not on file  Social Connections: Not on file  Intimate Partner Violence: Not on file      Review of systems: All other review of systems negative except as mentioned in the HPI.   Physical Exam: Vitals:   06/26/21 1016  BP: (!) 136/50  Pulse: 68   Body mass index is 33.5 kg/m. Gen:      No acute distress HEENT:  sclera anicteric Abd:      soft, non-tender; no palpable masses, no distension Ext:    No edema Neuro: alert and oriented x 3 Psych: normal mood and affect  Data Reviewed:  Reviewed labs, radiology imaging, old records and pertinent past GI work up   Assessment and Plan/Recommendations:  71 year old very pleasant female with multiple comorbidities, hypertension, CAD, CHF, end-stage renal disease on  chronic anticoagulation with Eliquis She is currently getting work-up for possible renal transplant  She had multiple tubular adenomas >10 removed during last colonoscopy in 2018, is past due for surveillance colonoscopy Schedule for colonoscopy at [facility] endoscopy center given her co morbidities The risks and benefits as well as alternatives of endoscopic procedure(s) have been discussed and reviewed. All questions answered. The patient agrees to proceed.  Will request clearance from prescribing provider to hold Eliquis for 2 days prior to the procedure  The patient was  provided an opportunity to ask questions and all were answered. The patient agreed with the plan and demonstrated an understanding of the instructions.  Damaris Hippo , MD    CC: Wendie Agreste, MD

## 2021-06-27 ENCOUNTER — Telehealth: Payer: Self-pay | Admitting: *Deleted

## 2021-06-27 NOTE — Telephone Encounter (Signed)
Request to hold Eliquis for colonoscopy.

## 2021-06-27 NOTE — Telephone Encounter (Signed)
° °  Name: Brittany Charles  DOB: [birth year]  MRN: [id number]   Primary Cardiologist: Candee Furbish, MD  Chart reviewed as part of pre-operative protocol coverage. Patient was contacted 06/27/2021 in reference to pre-operative risk assessment for pending surgery as outlined below.  Brittany Charles was last seen on 12/10/2020 by Estella Husk, PA-C.  Since that day, Brittany Charles has done very well and has been without symptoms of angina or decompensation.  She has also been evaluated by [facility], as part of her renal transplant work up, and underwent a nuclear stress test in 04/2021 that was low risk. Per pharmacy, she may hold Eliquis for 2 days prior to colonoscopy with resumption as soon as safely possible as deemed by her GI team.  Therefore, based on ACC/AHA guidelines, the patient would be at acceptable risk for the planned procedure without further cardiovascular testing.   The patient was advised that if she develops new symptoms prior to surgery to contact our office to arrange for a follow-up visit, and she verbalized understanding.  I will route this recommendation to the requesting party via Epic fax function and remove from pre-op pool. Please call with questions.  Christell Faith, PA-C 06/27/2021, 11:28 AM

## 2021-06-27 NOTE — Telephone Encounter (Signed)
 [facility] Pre-operative Risk Assessment     Request for surgical clearance:     Endoscopy Procedure  What type of surgery is being performed?     Colonoscopy  When is this surgery scheduled?     4/25  What type of clearance is required ?   Pharmacy  Are there any medications that need to be held prior to surgery and how long? 2 Days  Practice name and name of physician performing surgery?      [facility]  What is your office phone and fax number?      Phone- [phone]  [phone]  Anesthesia type (None, local, MAC, general) ?       MAC

## 2021-06-27 NOTE — Telephone Encounter (Signed)
Patient with diagnosis of A fib on Eliquis for anticoagulation.    Procedure: colonoscopy Date of procedure: 09/02/20   CHA2DS2-VASc Score = 6  This indicates a 9.7% annual risk of stroke. The patient's score is based upon: CHF History: 1 HTN History: 1 Diabetes History: 1 Stroke History: 0 Vascular Disease History: 1 Age Score: 1 Gender Score: 1    CrCl 14 mL/min Platelet count 362K   Per office protocol, patient can hold Eliquis for 2 days prior to procedure.

## 2021-07-01 ENCOUNTER — Encounter: Payer: Self-pay | Admitting: Gastroenterology

## 2021-07-01 LAB — HM DIABETES EYE EXAM

## 2021-07-02 ENCOUNTER — Other Ambulatory Visit: Payer: Self-pay

## 2021-07-02 DIAGNOSIS — N185 Chronic kidney disease, stage 5: Secondary | ICD-10-CM

## 2021-07-03 ENCOUNTER — Other Ambulatory Visit (HOSPITAL_COMMUNITY): Payer: Self-pay

## 2021-07-03 NOTE — Progress Notes (Signed)
Office Note    HPI: Brittany Charles is a 71 y.o. ([birth year]) female presenting in follow up s/p left-sided brachiocephalic fistula creation on 05/27/2021.  Since surgery, she has been doing well with no complaints.  She denies numbness or tingling in the hand.  Normal motor and sensation.  Though stage 5 CKD, Brittany Charles has not needed dialysis.  No issues with wound healing, no erythema, no induration.  She denies fevers, chills.   Past Medical History:  Diagnosis Date   Anemia    Arthritis    CHF (congestive heart failure) (HCC)    Colon polyps    Fibroids    GERD (gastroesophageal reflux disease)    Hypertension    Lupus (systemic lupus erythematosus) ([place])    Myocardial infarction ([place])    Pneumonia    Renal insufficiency     Past Surgical History:  Procedure Laterality Date   ABDOMINAL HYSTERECTOMY  10/27/2000   TAH, BSO   AV FISTULA PLACEMENT Left 05/27/2021   Procedure: LEFT BRACHIOCEPHALIC ARTERIOVENOUS (AV) FISTULA CREATION;  Surgeon: Broadus John, MD;  Location: [facility];  Service: Vascular;  Laterality: Left;  with peripheral nerve block   LEFT AND RIGHT HEART CATHETERIZATION WITH CORONARY ANGIOGRAM Right 04/21/2011   Procedure: LEFT AND RIGHT HEART CATHETERIZATION WITH CORONARY ANGIOGRAM;  Surgeon: [name], MD;  Location: [facility] CATH LAB;  Service: Cardiovascular;  Laterality: Right;   TONSILLECTOMY      Social History   Socioeconomic History   Marital status: Single    Spouse name: Not on file   Number of children: 1   Years of education: Not on file   Highest education level: Not on file  Occupational History   Occupation: retired Therapist, sports  Tobacco Use   Smoking status: Never   Smokeless tobacco: Never  Vaping Use   Vaping Use: Never used  Substance and Sexual Activity   Alcohol use: No   Drug use: No   Sexual activity: Never  Other Topics Concern   Not on file  Social History Narrative   Not on file   Social Determinants of Health   Financial  Resource Strain: Not on file  Food Insecurity: Not on file  Transportation Needs: Not on file  Physical Activity: Not on file  Stress: Not on file  Social Connections: Not on file  Intimate Partner Violence: Not on file    Family History  Problem Relation Age of Onset   Hypertension Mother    Heart disease Mother        CHF   Stroke Mother    Diabetes Mother    Hyperlipidemia Father    Stroke Father    Hypertension Father    Asthma Brother    Kidney disease Brother    Stroke Maternal Grandmother    Colon cancer Other        several cousins   Breast cancer Other    Colon cancer Son    Hypertension Son     Current Outpatient Medications  Medication Sig Dispense Refill   acetaminophen (TYLENOL) 325 MG tablet Take 650 mg by mouth every 6 (six) hours as needed for moderate pain.     albuterol (VENTOLIN HFA) 108 (90 Base) MCG/ACT inhaler Inhale 2 puffs into the lungs every 6 (six) hours as needed for wheezing or shortness of breath. 8 g 2   allopurinol (ZYLOPRIM) 100 MG tablet TAKE 1 TABLET EVERY DAY 90 tablet 0   apixaban (ELIQUIS) 5 MG TABS tablet Take 1 tablet (  5 mg total) by mouth 2 (two) times daily. 60 tablet 5   ascorbic acid (VITAMIN C) 500 MG tablet Take 1 tablet (500 mg total) by mouth daily. 30 tablet 0   aspirin EC 81 MG tablet Take 81 mg by mouth daily. Swallow whole.     calcitRIOL (ROCALTROL) 0.25 MCG capsule Take 0.25 mcg by mouth daily.     DILT-XR 180 MG 24 hr capsule Take 180 mg by mouth daily.     doxazosin (CARDURA) 1 MG tablet Take 2 mg by mouth at bedtime.     furosemide (LASIX) 80 MG tablet Take 80 mg by mouth daily as needed (swelling). (Patient not taking: Reported on 06/26/2021)     hydrALAZINE (APRESOLINE) 50 MG tablet TAKE 1 TABLET THREE TIMES DAILY 270 tablet 0   metoprolol succinate (TOPROL-XL) 25 MG 24 hr tablet Take 1 tablet (25 mg total) by mouth daily. Take with or immediately following a meal. 90 tablet 3   predniSONE (DELTASONE) 50 MG tablet  Take 25 mg by mouth daily as needed (gout flare).     rosuvastatin (CRESTOR) 20 MG tablet Take 1 tablet (20 mg total) by mouth daily. 90 tablet 3   sodium bicarbonate 650 MG tablet Take 650 mg by mouth 2 (two) times daily.     zinc sulfate 220 (50 Zn) MG capsule Take 1 capsule (220 mg total) by mouth daily. 30 capsule 0   No current facility-administered medications for this visit.    Allergies  Allergen Reactions   Zithromax [Azithromycin] Nausea And Vomiting     REVIEW OF SYSTEMS:   [X]  denotes positive finding, [ ]  denotes negative finding Cardiac  Comments:  Chest pain or chest pressure:    Shortness of breath upon exertion:    Short of breath when lying flat:    Irregular heart rhythm:        Vascular    Pain in calf, thigh, or hip brought on by ambulation:    Pain in feet at night that wakes you up from your sleep:     Blood clot in your veins:    Leg swelling:         Pulmonary    Oxygen at home:    Productive cough:     Wheezing:         Neurologic    Sudden weakness in arms or legs:     Sudden numbness in arms or legs:     Sudden onset of difficulty speaking or slurred speech:    Temporary loss of vision in one eye:     Problems with dizziness:         Gastrointestinal    Blood in stool:     Vomited blood:         Genitourinary    Burning when urinating:     Blood in urine:        Psychiatric    Major depression:         Hematologic    Bleeding problems:    Problems with blood clotting too easily:        Skin    Rashes or ulcers:        Constitutional    Fever or chills:      PHYSICAL EXAMINATION:  There were no vitals filed for this visit.  General:  WDWN in NAD; vital signs documented above Gait: Not observed HENT: WNL, normocephalic Pulmonary: normal non-labored breathing , without wheezing Cardiac: regular HR, Abdomen: soft,  NT, no masses Skin: without rashes Vascular Exam/Pulses:  Right Left  Radial 2+ (normal) 2+ (normal)   Ulnar 1+ (weak) 1+ (weak)                   Extremities: without ischemic changes, without Gangrene , without cellulitis; without open wounds;  Palpable thrill Musculoskeletal: no muscle wasting or atrophy  Neurologic: A&O X 3;  No focal weakness or paresthesias are detected Psychiatric:  The pt has Normal affect.   Non-Invasive Vascular Imaging:   Summary:  .   Patent left Brachio-cephalic AVF with increased velocity at the  antecubital fossa at area of tortuosity.     ASSESSMENT/PLAN: AZIA TOUTANT is a 71 y.o. female presenting status post left arm brachiocephalic fistula creation.  This site is healed nicely.  There is a excellent thrill in the antecubital fossa.  4 cm more proximal, there is a large competing branch that needs ligation for further maturation.  After discussing the risk and benefits, Brittany Charles elected to proceed.  At this time, I believe the elevated velocities immediately distal to the anastomosis are from tortuosity.  Should this, or depth become an issue, I will pursue fistulogram and possible superficialization respectively.   Broadus John, MD [facility] [phone]

## 2021-07-04 ENCOUNTER — Encounter: Payer: Self-pay | Admitting: Vascular Surgery

## 2021-07-04 ENCOUNTER — Ambulatory Visit (INDEPENDENT_AMBULATORY_CARE_PROVIDER_SITE_OTHER): Payer: Medicare HMO | Admitting: Vascular Surgery

## 2021-07-04 ENCOUNTER — Ambulatory Visit (HOSPITAL_COMMUNITY)
Admission: RE | Admit: 2021-07-04 | Discharge: 2021-07-04 | Disposition: A | Discharge status: Home / Self Care | Payer: Medicare HMO | Source: Ambulatory Visit | Attending: Vascular Surgery | Admitting: Vascular Surgery

## 2021-07-04 ENCOUNTER — Ambulatory Visit (HOSPITAL_COMMUNITY)
Admission: RE | Admit: 2021-07-04 | Discharge: 2021-07-04 | Disposition: A | Discharge status: Home / Self Care | Payer: Medicare HMO | Source: Ambulatory Visit | Attending: Internal Medicine | Admitting: Internal Medicine

## 2021-07-04 ENCOUNTER — Other Ambulatory Visit: Payer: Self-pay

## 2021-07-04 VITALS — BP 156/63 | HR 61 | Temp 98.0°F | Resp 20 | Ht 64.5 in | Wt 196.0 lb

## 2021-07-04 DIAGNOSIS — N185 Chronic kidney disease, stage 5: Secondary | ICD-10-CM

## 2021-07-04 MED ORDER — SODIUM CHLORIDE 0.9 % IV SOLN
510.0000 mg | INTRAVENOUS | Status: DC
  Administered 2021-07-04: 510 mg via INTRAVENOUS
  Filled 2021-07-04: qty 510

## 2021-07-07 DIAGNOSIS — N2581 Secondary hyperparathyroidism of renal origin: Secondary | ICD-10-CM | POA: Diagnosis not present

## 2021-07-07 DIAGNOSIS — R7302 Impaired glucose tolerance (oral): Secondary | ICD-10-CM | POA: Diagnosis not present

## 2021-07-07 DIAGNOSIS — M329 Systemic lupus erythematosus, unspecified: Secondary | ICD-10-CM | POA: Diagnosis not present

## 2021-07-07 DIAGNOSIS — E785 Hyperlipidemia, unspecified: Secondary | ICD-10-CM | POA: Diagnosis not present

## 2021-07-07 DIAGNOSIS — N185 Chronic kidney disease, stage 5: Secondary | ICD-10-CM | POA: Diagnosis not present

## 2021-07-07 DIAGNOSIS — E872 Acidosis, unspecified: Secondary | ICD-10-CM | POA: Diagnosis not present

## 2021-07-07 DIAGNOSIS — I120 Hypertensive chronic kidney disease with stage 5 chronic kidney disease or end stage renal disease: Secondary | ICD-10-CM | POA: Diagnosis not present

## 2021-07-07 DIAGNOSIS — D649 Anemia, unspecified: Secondary | ICD-10-CM | POA: Diagnosis not present

## 2021-07-07 NOTE — Telephone Encounter (Signed)
Dr Silverio Decamp  For your review, patients procedure at [facility] in April

## 2021-07-08 ENCOUNTER — Encounter (HOSPITAL_COMMUNITY): Payer: Self-pay | Admitting: Vascular Surgery

## 2021-07-08 ENCOUNTER — Other Ambulatory Visit: Payer: Self-pay

## 2021-07-08 NOTE — Progress Notes (Signed)
Spoke with pt for pre-op call. Pt denies cardiac history. Pt is treated for HTN, is not diabetic. Pt is stage 5 kidney disease, has not started dialysis yet. Pt is on Eliquis and she states she takes if for prevention of stroke. Pt states her last dose 07/07/21 PM dose.   Pt's surgery is scheduled as ambulatory so no Covid test is required prior to surgery.

## 2021-07-09 NOTE — Telephone Encounter (Signed)
Patient aware to hold Eliquis 2 days. Also had to mail patient her instructions she lost them . Will call back as needed ?

## 2021-07-09 NOTE — Telephone Encounter (Signed)
Got it, thanks ?Please advise patient to hold Eliquis for 2 days prior to procedure. ?

## 2021-07-10 ENCOUNTER — Other Ambulatory Visit: Payer: Self-pay

## 2021-07-10 ENCOUNTER — Ambulatory Visit (HOSPITAL_BASED_OUTPATIENT_CLINIC_OR_DEPARTMENT_OTHER): Payer: Medicare HMO | Admitting: Anesthesiology

## 2021-07-10 ENCOUNTER — Ambulatory Visit (HOSPITAL_COMMUNITY): Payer: Medicare HMO | Admitting: Anesthesiology

## 2021-07-10 ENCOUNTER — Encounter (HOSPITAL_COMMUNITY)
Admission: RE | Disposition: A | Discharge status: Home / Self Care | Payer: Self-pay | Source: Ambulatory Visit | Attending: Vascular Surgery

## 2021-07-10 ENCOUNTER — Encounter (HOSPITAL_COMMUNITY): Payer: Self-pay | Admitting: Vascular Surgery

## 2021-07-10 ENCOUNTER — Ambulatory Visit (HOSPITAL_COMMUNITY)
Admission: RE | Admit: 2021-07-10 | Discharge: 2021-07-10 | Disposition: A | Discharge status: Home / Self Care | Payer: Medicare HMO | Source: Ambulatory Visit | Attending: Vascular Surgery | Admitting: Vascular Surgery

## 2021-07-10 DIAGNOSIS — D631 Anemia in chronic kidney disease: Secondary | ICD-10-CM | POA: Insufficient documentation

## 2021-07-10 DIAGNOSIS — N185 Chronic kidney disease, stage 5: Secondary | ICD-10-CM | POA: Insufficient documentation

## 2021-07-10 DIAGNOSIS — I132 Hypertensive heart and chronic kidney disease with heart failure and with stage 5 chronic kidney disease, or end stage renal disease: Secondary | ICD-10-CM | POA: Insufficient documentation

## 2021-07-10 DIAGNOSIS — T82591A Other mechanical complication of surgically created arteriovenous shunt, initial encounter: Secondary | ICD-10-CM | POA: Insufficient documentation

## 2021-07-10 DIAGNOSIS — N189 Chronic kidney disease, unspecified: Secondary | ICD-10-CM | POA: Diagnosis not present

## 2021-07-10 DIAGNOSIS — I509 Heart failure, unspecified: Secondary | ICD-10-CM | POA: Diagnosis not present

## 2021-07-10 DIAGNOSIS — I2510 Atherosclerotic heart disease of native coronary artery without angina pectoris: Secondary | ICD-10-CM

## 2021-07-10 DIAGNOSIS — I130 Hypertensive heart and chronic kidney disease with heart failure and stage 1 through stage 4 chronic kidney disease, or unspecified chronic kidney disease: Secondary | ICD-10-CM | POA: Diagnosis not present

## 2021-07-10 DIAGNOSIS — Y832 Surgical operation with anastomosis, bypass or graft as the cause of abnormal reaction of the patient, or of later complication, without mention of misadventure at the time of the procedure: Secondary | ICD-10-CM | POA: Diagnosis not present

## 2021-07-10 DIAGNOSIS — N184 Chronic kidney disease, stage 4 (severe): Secondary | ICD-10-CM | POA: Diagnosis not present

## 2021-07-10 HISTORY — PX: LIGATION OF ARTERIOVENOUS  FISTULA: SHX5948

## 2021-07-10 LAB — POCT I-STAT, CHEM 8
BUN: 46 mg/dL — ABNORMAL HIGH (ref 8–23)
Calcium, Ion: 1.27 mmol/L (ref 1.15–1.40)
Chloride: 114 mmol/L — ABNORMAL HIGH (ref 98–111)
Creatinine, Ser: 5.7 mg/dL — ABNORMAL HIGH (ref 0.44–1.00)
Glucose, Bld: 101 mg/dL — ABNORMAL HIGH (ref 70–99)
HCT: 35 % — ABNORMAL LOW (ref 36.0–46.0)
Hemoglobin: 11.9 g/dL — ABNORMAL LOW (ref 12.0–15.0)
Potassium: 4.4 mmol/L (ref 3.5–5.1)
Sodium: 145 mmol/L (ref 135–145)
TCO2: 23 mmol/L (ref 22–32)

## 2021-07-10 SURGERY — LIGATION OF ARTERIOVENOUS  FISTULA
Anesthesia: Monitor Anesthesia Care | Laterality: Left

## 2021-07-10 MED ORDER — 0.9 % SODIUM CHLORIDE (POUR BTL) OPTIME
TOPICAL | Status: DC | PRN
  Administered 2021-07-10: 1000 mL

## 2021-07-10 MED ORDER — ACETAMINOPHEN 160 MG/5ML PO SOLN: 1000.0000 mg | Freq: Once | ORAL | Status: DC | PRN

## 2021-07-10 MED ORDER — PROPOFOL 500 MG/50ML IV EMUL
INTRAVENOUS | Status: DC | PRN
  Administered 2021-07-10: 100 ug/kg/min via INTRAVENOUS

## 2021-07-10 MED ORDER — CEFAZOLIN SODIUM-DEXTROSE 2-4 GM/100ML-% IV SOLN
2.0000 g | INTRAVENOUS | Status: AC
  Administered 2021-07-10: 2 g via INTRAVENOUS

## 2021-07-10 MED ORDER — PHENYLEPHRINE 40 MCG/ML (10ML) SYRINGE FOR IV PUSH (FOR BLOOD PRESSURE SUPPORT)
PREFILLED_SYRINGE | INTRAVENOUS | Status: DC | PRN
  Administered 2021-07-10: 80 ug via INTRAVENOUS

## 2021-07-10 MED ORDER — PHENYLEPHRINE 40 MCG/ML (10ML) SYRINGE FOR IV PUSH (FOR BLOOD PRESSURE SUPPORT)
PREFILLED_SYRINGE | INTRAVENOUS | Status: AC
  Filled 2021-07-10: qty 10

## 2021-07-10 MED ORDER — CHLORHEXIDINE GLUCONATE 4 % EX LIQD: 60.0000 mL | Freq: Once | CUTANEOUS | Status: DC

## 2021-07-10 MED ORDER — PROPOFOL 1000 MG/100ML IV EMUL
INTRAVENOUS | Status: AC
  Filled 2021-07-10: qty 100

## 2021-07-10 MED ORDER — FENTANYL CITRATE (PF) 100 MCG/2ML IJ SOLN: 25.0000 ug | INTRAMUSCULAR | Status: DC | PRN

## 2021-07-10 MED ORDER — ONDANSETRON HCL 4 MG/2ML IJ SOLN
INTRAMUSCULAR | Status: AC
  Filled 2021-07-10: qty 2

## 2021-07-10 MED ORDER — CEFAZOLIN SODIUM-DEXTROSE 2-4 GM/100ML-% IV SOLN
INTRAVENOUS | Status: AC
  Filled 2021-07-10: qty 100

## 2021-07-10 MED ORDER — LIDOCAINE HCL 1 % IJ SOLN
INTRAMUSCULAR | Status: AC
  Filled 2021-07-10: qty 20

## 2021-07-10 MED ORDER — ONDANSETRON HCL 4 MG/2ML IJ SOLN
INTRAMUSCULAR | Status: DC | PRN
  Administered 2021-07-10: 4 mg via INTRAVENOUS

## 2021-07-10 MED ORDER — ACETAMINOPHEN 500 MG PO TABS: 1000.0000 mg | ORAL_TABLET | Freq: Once | ORAL | Status: DC | PRN

## 2021-07-10 MED ORDER — LIDOCAINE HCL 1 % IJ SOLN
INTRAMUSCULAR | Status: DC | PRN
  Administered 2021-07-10: 3 mL via INTRADERMAL

## 2021-07-10 MED ORDER — FENTANYL CITRATE (PF) 250 MCG/5ML IJ SOLN
INTRAMUSCULAR | Status: DC | PRN
  Administered 2021-07-10: 25 ug via INTRAVENOUS
  Administered 2021-07-10: 50 ug via INTRAVENOUS

## 2021-07-10 MED ORDER — OXYCODONE-ACETAMINOPHEN 5-325 MG PO TABS: 1.0000 | ORAL_TABLET | Freq: Four times a day (QID) | ORAL | 0 refills | Status: DC | PRN

## 2021-07-10 MED ORDER — CHLORHEXIDINE GLUCONATE 0.12 % MT SOLN
OROMUCOSAL | Status: AC
  Administered 2021-07-10: 15 mL via OROMUCOSAL
  Filled 2021-07-10: qty 15

## 2021-07-10 MED ORDER — CHLORHEXIDINE GLUCONATE 0.12 % MT SOLN: 15.0000 mL | Freq: Once | OROMUCOSAL | Status: AC

## 2021-07-10 MED ORDER — ACETAMINOPHEN 10 MG/ML IV SOLN: 1000.0000 mg | Freq: Once | INTRAVENOUS | Status: DC | PRN

## 2021-07-10 MED ORDER — ORAL CARE MOUTH RINSE: 15.0000 mL | Freq: Once | OROMUCOSAL | Status: AC

## 2021-07-10 MED ORDER — FENTANYL CITRATE (PF) 250 MCG/5ML IJ SOLN
INTRAMUSCULAR | Status: AC
  Filled 2021-07-10: qty 5

## 2021-07-10 MED ORDER — SODIUM CHLORIDE 0.9 % IV SOLN: INTRAVENOUS | Status: DC

## 2021-07-10 SURGICAL SUPPLY — 28 items
ARMBAND PINK RESTRICT EXTREMIT (MISCELLANEOUS) ×2 IMPLANT
BAG COUNTER SPONGE SURGICOUNT (BAG) ×2 IMPLANT
COVER PROBE W GEL 5X96 (DRAPES) ×2 IMPLANT
DERMABOND ADHESIVE PROPEN (GAUZE/BANDAGES/DRESSINGS) ×1
DERMABOND ADVANCED (GAUZE/BANDAGES/DRESSINGS) ×1
DERMABOND ADVANCED .7 DNX12 (GAUZE/BANDAGES/DRESSINGS) ×1 IMPLANT
DERMABOND ADVANCED .7 DNX6 (GAUZE/BANDAGES/DRESSINGS) IMPLANT
ELECT REM PT RETURN 9FT ADLT (ELECTROSURGICAL) ×2
ELECTRODE REM PT RTRN 9FT ADLT (ELECTROSURGICAL) ×1 IMPLANT
GLOVE SRG 8 PF TXTR STRL LF DI (GLOVE) ×2 IMPLANT
GLOVE SURG UNDER POLY LF SZ8 (GLOVE) ×4
GOWN STRL REUS W/ TWL LRG LVL3 (GOWN DISPOSABLE) ×2 IMPLANT
GOWN STRL REUS W/TWL 2XL LVL3 (GOWN DISPOSABLE) ×2 IMPLANT
GOWN STRL REUS W/TWL LRG LVL3 (GOWN DISPOSABLE) ×4
KIT BASIN OR (CUSTOM PROCEDURE TRAY) ×2 IMPLANT
KIT TURNOVER KIT B (KITS) ×2 IMPLANT
NS IRRIG 1000ML POUR BTL (IV SOLUTION) ×1 IMPLANT
PACK CV ACCESS (CUSTOM PROCEDURE TRAY) ×2 IMPLANT
PAD ARMBOARD 7.5X6 YLW CONV (MISCELLANEOUS) ×4 IMPLANT
SUT ETHILON 3 0 PS 1 (SUTURE) IMPLANT
SUT MNCRL AB 4-0 PS2 18 (SUTURE) ×4 IMPLANT
SUT SILK 0 TIES 10X30 (SUTURE) ×2 IMPLANT
SUT SILK 3 0 SH CR/8 (SUTURE) ×2 IMPLANT
SUT VIC AB 3-0 SH 27 (SUTURE) ×2
SUT VIC AB 3-0 SH 27X BRD (SUTURE) ×1 IMPLANT
TOWEL GREEN STERILE (TOWEL DISPOSABLE) ×2 IMPLANT
UNDERPAD 30X36 HEAVY ABSORB (UNDERPADS AND DIAPERS) ×2 IMPLANT
WATER STERILE IRR 1000ML POUR (IV SOLUTION) ×2 IMPLANT

## 2021-07-10 NOTE — Op Note (Signed)
? ? ?  NAME: [name]    MRN: [id number] ?DOB: 1950-08-01    DATE OF OPERATION: 07/10/2021 ? ?PREOP DIAGNOSIS:   ? ?Chronic kidney disease stage IV ? ?POSTOP DIAGNOSIS:   ? ?Same ? ?PROCEDURE:  ?  ?Left AV fistula branch ligation ? ?SURGEON: Broadus John ? ?ASSIST: None ? ?ANESTHESIA: Moderate, local ? ?EBL: 1 mL ? ?INDICATIONS:  ? ? [name] is a 71 y.o. female presenting status post left arm brachiocephalic fistula creation.  This site is healed nicely.  There is a excellent thrill in the antecubital fossa.  4 cm more proximal, there is a large competing branch that needs ligation for further maturation.  After discussing the risk and benefits, Shakiya elected to proceed. ?  ?At this time, I believe the elevated velocities immediately distal to the anastomosis are from tortuosity.  Should this, or depth become an issue, I will pursue fistulogram and possible superficialization respectively. ? ?FINDINGS:  ? ?Large cephalic branch appreciated roughly 4 cm from the arterial anastomosis ? ?TECHNIQUE:  ? ?Patient was brought to the OR laid in the supine position.  Moderate anesthesia was induced and the patient was prepped and draped in standard fashion.  A timeout was performed.  Ultrasound insonation was used to mark the fistula branch.  Local anesthetic was administered at the planned incision site.  Next, a 10 blade was used to open the skin.  The fistula branch was exposed in standard fashion.  This was looped.  I ensured the fistula was still functioning appropriately prior to ligation.  Next, 2-0 silk suture was used both proximally and distally followed by 2 medium clips and ligation of the fistula branch. ? ?The wound was irrigated with saline, and closed using 3-0 Vicryl deep, with Monocryl and Dermabond at the skin. ? ? ?Macie Burows, MD ?[facility] ? ?DATE OF DICTATION:   07/10/2021 ? ?

## 2021-07-10 NOTE — Anesthesia Preprocedure Evaluation (Signed)
Anesthesia Evaluation  ?Patient identified by MRN, date of birth, ID band ?Patient awake ? ? ? ?Reviewed: ?Allergy & Precautions, NPO status , Patient's Chart, lab work & pertinent test results ? ?History of Anesthesia Complications ?Negative for: history of anesthetic complications ? ?Airway ?Mallampati: II ? ?TM Distance: >3 FB ?Neck ROM: Full ? ? ? Dental ? ?(+) Teeth Intact, Dental Advisory Given ?  ?Pulmonary ?neg shortness of breath, neg sleep apnea, neg COPD, neg recent URI,  ?  ?breath sounds clear to auscultation ? ? ? ? ? ? Cardiovascular ?hypertension, + CAD and +CHF  ? ?Rhythm:Regular  ??1. Left ventricular ejection fraction, by estimation, is 65 to 70%. The  ?left ventricle has normal function. The left ventricle has no regional  ?wall motion abnormalities. Left ventricular diastolic parameters were  ?normal.  ??2. Right ventricular systolic function is normal. The right ventricular  ?size is normal. Tricuspid regurgitation signal is inadequate for assessing  ?PA pressure.  ??3. The mitral valve is grossly normal. No evidence of mitral valve  ?regurgitation. No evidence of mitral stenosis.  ??4. The aortic valve is tricuspid. Aortic valve regurgitation is not  ?visualized. Mild aortic valve sclerosis is present, with no evidence of  ?aortic valve stenosis.  ??5. The inferior vena cava is normal in size with greater than 50%  ?respiratory variability, suggesting right atrial pressure of 3 mmHg.  ?  ?Neuro/Psych ?negative neurological ROS ? negative psych ROS  ? GI/Hepatic ?Neg liver ROS, GERD  Controlled,  ?Endo/Other  ?negative endocrine ROS ? Renal/GU ?CRFRenal diseaseLab Results ?     Component                Value               Date                 ?     CREATININE               5.70 (H)            07/10/2021           ?Lab Results ?     Component                Value               Date                 ?     K                        4.4                 07/10/2021            ?  ? ?  ?Musculoskeletal ? ? Abdominal ?  ?Peds ? Hematology ? ?(+) Blood dyscrasia, anemia , Lab Results ?     Component                Value               Date                 ?     WBC                      11.7 (H)            03/07/2021           ?  HGB                      11.9 (L)            07/10/2021           ?     HCT                      35.0 (L)            07/10/2021           ?     MCV                      87.1                03/07/2021           ?     PLT                      362                 03/07/2021           ? ?eliquis   ?Anesthesia Other Findings ? ? Reproductive/Obstetrics ? ?  ? ? ? ? ? ? ? ? ? ? ? ? ? ?  ?  ? ? ? ? ? ? ? ? ?Anesthesia Physical ?Anesthesia Plan ? ?ASA: 3 ? ?Anesthesia Plan: MAC  ? ?Post-op Pain Management: Minimal or no pain anticipated  ? ?Induction: Intravenous ? ?PONV Risk Score and Plan: 2 and Propofol infusion and Treatment may vary due to age or medical condition ? ?Airway Management Planned: Nasal Cannula ? ?Additional Equipment: None ? ?Intra-op Plan:  ? ?Post-operative Plan:  ? ?Informed Consent: I have reviewed the patients History and Physical, chart, labs and discussed the procedure including the risks, benefits and alternatives for the proposed anesthesia with the patient or authorized representative who has indicated his/her understanding and acceptance.  ? ? ? ?Dental advisory given ? ?Plan Discussed with: CRNA and Anesthesiologist ? ?Anesthesia Plan Comments:   ? ? ? ? ? ? ?Anesthesia Quick Evaluation ? ?

## 2021-07-10 NOTE — Transfer of Care (Signed)
Immediate Anesthesia Transfer of Care Note ? ?Patient: Brittany Charles ? ?Procedure(s) Performed: LEFT ARM BRANCH LIGATION OF ARTERIOVENOUS  FISTULA (Left) ? ?Patient Location: PACU ? ?Anesthesia Type:MAC ? ?Level of Consciousness: drowsy ? ?Airway & Oxygen Therapy: Patient Spontanous Breathing and Patient connected to face mask oxygen ? ?Post-op Assessment: Report given to RN and Post -op Vital signs reviewed and stable ? ?Post vital signs: Reviewed and stable ? ?Last Vitals:  ?Vitals Value Taken Time  ?BP 110/56   ?Temp    ?Pulse 57 07/10/21 0815  ?Resp 12   ?SpO2 97 % 07/10/21 0815  ?Vitals shown include unvalidated device data. ? ?Last Pain:  ?Vitals:  ? 07/10/21 0645  ?TempSrc:   ?PainSc: 0-No pain  ?   ? ?  ? ?Complications: No notable events documented. ?

## 2021-07-10 NOTE — Discharge Instructions (Signed)
° °  [facility] ° °Discharge Instructions ° °AV Fistula or Graft Surgery for Dialysis Access ° °Please refer to the following instructions for your post-procedure care. Your surgeon or physician assistant will discuss any changes with you. ° °Activity ° °You may drive the day following your surgery, if you are comfortable and no longer taking prescription pain medication. Resume full activity as the soreness in your incision resolves. ° °Bathing/Showering ° °You may shower after you go home. Keep your incision dry for 48 hours. Do not soak in a bathtub, hot tub, or swim until the incision heals completely. You may not shower if you have a hemodialysis catheter. ° °Incision Care ° °Clean your incision with mild soap and water after 48 hours. Pat the area dry with a clean towel. You do not need a bandage unless otherwise instructed. Do not apply any ointments or creams to your incision. You may have skin glue on your incision. Do not peel it off. It will come off on its own in about one week. Your arm may swell a bit after surgery. To reduce swelling use pillows to elevate your arm so it is above your heart. Your doctor will tell you if you need to lightly wrap your arm with an ACE bandage. ° °Diet ° °Resume your normal diet. There are not special food restrictions following this procedure. In order to heal from your surgery, it is CRITICAL to get adequate nutrition. Your body requires vitamins, minerals, and protein. Vegetables are the best source of vitamins and minerals. Vegetables also provide the perfect balance of protein. Processed food has little nutritional value, so try to avoid this. ° °Medications ° °Resume taking all of your medications. If your incision is causing pain, you may take over-the counter pain relievers such as acetaminophen (Tylenol). If you were prescribed a stronger pain medication, please be aware these medications can cause nausea and constipation. Prevent  nausea by taking the medication with a snack or meal. Avoid constipation by drinking plenty of fluids and eating foods with high amount of fiber, such as fruits, vegetables, and grains. Do not take Tylenol if you are taking prescription pain medications. ° ° ° ° °Follow up °Your surgeon may want to see you in the office following your access surgery. If so, this will be arranged at the time of your surgery. ° °Please call us immediately for any of the following conditions: ° °Increased pain, redness, drainage (pus) from your incision site °Fever of 101 degrees or higher °Severe or worsening pain at your incision site °Hand pain or numbness. ° °Reduce your risk of vascular disease: ° °Stop smoking. If you would like help, call [facility] at 1-800-QUIT-NOW (1-800-784-8669) or [place] at [phone] ° °Manage your cholesterol °Maintain a desired weight °Control your diabetes °Keep your blood pressure down ° °Dialysis ° °It will take several weeks to several months for your new dialysis access to be ready for use. Your surgeon will determine when it is OK to use it. Your nephrologist will continue to direct your dialysis. You can continue to use your Permcath until your new access is ready for use. ° °If you have any questions, please call the office at [phone]. ° °

## 2021-07-10 NOTE — Anesthesia Procedure Notes (Signed)
Procedure Name: [place] ?Date/Time: 07/10/2021 7:40 AM ?Performed by: Erick Colace, CRNA ?Pre-anesthesia Checklist: Patient identified, Emergency Drugs available, Suction available, Patient being monitored and Timeout performed ?Patient Re-evaluated:Patient Re-evaluated prior to induction ?Oxygen Delivery Method: Simple face mask ?Preoxygenation: Pre-oxygenation with 100% oxygen ?Induction Type: IV induction ? ? ? ? ?

## 2021-07-10 NOTE — H&P (Signed)
?Office Note  ?Patient seen and examined in preop holding.  No complaints. ?No changes to medication history or physical exam since last seen in clinic. ?After discussing the risks and benefits of fistula branch ligation, Brittany Charles elected to proceed.  ? ?Brittany John MD ? ? ?HPI: Brittany Charles is a 71 y.o. ([birth year]) female presenting in follow up s/p left-sided brachiocephalic fistula creation on 05/27/2021. ? ?Since surgery, she has been doing well with no complaints.  She denies numbness or tingling in the hand.  Normal motor and sensation.  Though stage 5 CKD, Brittany Charles has not needed dialysis.  No issues with wound healing, no erythema, no induration.  She denies fevers, chills. ? ? ?Past Medical History:  ?Diagnosis Date  ? Anemia   ? Arthritis   ? CHF (congestive heart failure) ([place])   ? Colon polyps   ? COVID 2020  ? hospitalized at [facility]  ? Fibroids   ? GERD (gastroesophageal reflux disease)   ? Hypertension   ? Lupus (systemic lupus erythematosus) ([place])   ? Myocardial infarction [facility])   ? Pneumonia   ? Renal insufficiency   ? CKD stage 5- not on dialysis (07/08/21)  ? ? ?Past Surgical History:  ?Procedure Laterality Date  ? ABDOMINAL HYSTERECTOMY  10/27/2000  ? TAH, BSO  ? AV FISTULA PLACEMENT Left 05/27/2021  ? Procedure: LEFT BRACHIOCEPHALIC ARTERIOVENOUS (AV) FISTULA CREATION;  Surgeon: Brittany John, MD;  Location: [place];  Service: Vascular;  Laterality: Left;  with peripheral nerve block  ? LEFT AND RIGHT HEART CATHETERIZATION WITH CORONARY ANGIOGRAM Right 04/21/2011  ? Procedure: LEFT AND RIGHT HEART CATHETERIZATION WITH CORONARY ANGIOGRAM;  Surgeon: [name], MD;  Location: [facility] CATH LAB;  Service: Cardiovascular;  Laterality: Right;  ? TONSILLECTOMY    ? ? ?Social History  ? ?Socioeconomic History  ? Marital status: Single  ?  Spouse name: Not on file  ? Number of children: 1  ? Years of education: Not on file  ? Highest education level: Not on file  ?Occupational History   ? Occupation: retired Therapist, sports  ?Tobacco Use  ? Smoking status: Never  ? Smokeless tobacco: Never  ?Vaping Use  ? Vaping Use: Never used  ?Substance and Sexual Activity  ? Alcohol use: No  ? Drug use: No  ? Sexual activity: Never  ?Other Topics Concern  ? Not on file  ?Social History Narrative  ? Not on file  ? ?Social Determinants of Health  ? ?Financial Resource Strain: Not on file  ?Food Insecurity: Not on file  ?Transportation Needs: Not on file  ?Physical Activity: Not on file  ?Stress: Not on file  ?Social Connections: Not on file  ?Intimate Partner Violence: Not on file  ? ? ?Family History  ?Problem Relation Age of Onset  ? Hypertension Mother   ? Heart disease Mother   ?     CHF  ? Stroke Mother   ? Diabetes Mother   ? Hyperlipidemia Father   ? Stroke Father   ? Hypertension Father   ? Asthma Brother   ? Kidney disease Brother   ? Stroke Maternal Grandmother   ? Colon cancer Other   ?     several cousins  ? Breast cancer Other   ? Colon cancer Son   ? Hypertension Son   ? ? ?Current Facility-Administered Medications  ?Medication Dose Route Frequency Provider Last Rate Last Admin  ? 0.9 %  sodium chloride infusion   Intravenous  Continuous Brittany John, MD   New Bag at 07/10/21 [phone]  ? ceFAZolin (ANCEF) 2-4 GM/100ML-% IVPB           ? ceFAZolin (ANCEF) IVPB 2g/100 mL premix  2 g Intravenous To SS-Surg Brittany John, MD      ? chlorhexidine (HIBICLENS) 4 % liquid 4 application  60 mL Topical Once Brittany John, MD      ? And  ? [START ON 07/11/2021] chlorhexidine (HIBICLENS) 4 % liquid 4 application  60 mL Topical Once Brittany John, MD      ? ? ?Allergies  ?Allergen Reactions  ? Zithromax [Azithromycin] Nausea And Vomiting  ? ? ? ?REVIEW OF SYSTEMS:  ? ?[X]  denotes positive finding, [ ]  denotes negative finding ?Cardiac  Comments:  ?Chest pain or chest pressure:    ?Shortness of breath upon exertion:    ?Short of breath when lying flat:    ?Irregular heart rhythm:    ?    ?Vascular    ?Pain in calf,  thigh, or hip brought on by ambulation:    ?Pain in feet at night that wakes you up from your sleep:     ?Blood clot in your veins:    ?Leg swelling:     ?    ?Pulmonary    ?Oxygen at home:    ?Productive cough:     ?Wheezing:     ?    ?Neurologic    ?Sudden weakness in arms or legs:     ?Sudden numbness in arms or legs:     ?Sudden onset of difficulty speaking or slurred speech:    ?Temporary loss of vision in one eye:     ?Problems with dizziness:     ?    ?Gastrointestinal    ?Blood in stool:     ?Vomited blood:     ?    ?Genitourinary    ?Burning when urinating:     ?Blood in urine:    ?    ?Psychiatric    ?Major depression:     ?    ?Hematologic    ?Bleeding problems:    ?Problems with blood clotting too easily:    ?    ?Skin    ?Rashes or ulcers:    ?    ?Constitutional    ?Fever or chills:    ? ? ?PHYSICAL EXAMINATION: ? ?Vitals:  ? 07/10/21 0614  ?BP: (!) 169/57  ?Pulse: 63  ?Resp: 18  ?Temp: 99.2 ?F (37.3 ?C)  ?TempSrc: Oral  ?SpO2: 99%  ?Weight: 87.5 kg  ?Height: 5\' 5"  (1.651 m)  ? ? ?General:  WDWN in NAD; vital signs documented above ?Gait: Not observed ?HENT: WNL, normocephalic ?Pulmonary: normal non-labored breathing , without wheezing ?Cardiac: regular HR, ?Abdomen: soft, NT, no masses ?Skin: without rashes ?Vascular Exam/Pulses: ? Right Left  ?Radial 2+ (normal) 2+ (normal)  ?Ulnar 1+ (weak) 1+ (weak)  ?    ?    ?    ?    ? ?Extremities: without ischemic changes, without Gangrene , without cellulitis; without open wounds;  ?Palpable thrill ?Musculoskeletal: no muscle wasting or atrophy  ?Neurologic: A&O X 3;  No focal weakness or paresthesias are detected ?Psychiatric:  The pt has Normal affect. ? ? ?Non-Invasive Vascular Imaging:   ?Summary:  ?.  ? Patent left Brachio-cephalic AVF with increased velocity at the  ?antecubital fossa at area of tortuosity.  ? ? ? ?ASSESSMENT/PLAN: Brittany Charles is a 71  y.o. female presenting status post left arm brachiocephalic fistula creation.  This site is  healed nicely.  There is a excellent thrill in the antecubital fossa.  4 cm more proximal, there is a large competing branch that needs ligation for further maturation.  After discussing the risk and benefits, Brittany Charles elected to proceed. ? ?At this time, I believe the elevated velocities immediately distal to the anastomosis are from tortuosity.  Should this, or depth become an issue, I will pursue fistulogram and possible superficialization respectively. ? ? ?Brittany John, MD ?[facility] ?[phone]  ?

## 2021-07-11 ENCOUNTER — Encounter (HOSPITAL_COMMUNITY): Payer: Self-pay | Admitting: Vascular Surgery

## 2021-07-12 NOTE — Anesthesia Postprocedure Evaluation (Signed)
Anesthesia Post Note ? ?Patient: Brittany Charles ? ?Procedure(s) Performed: LEFT ARM BRANCH LIGATION OF ARTERIOVENOUS  FISTULA (Left) ? ?  ? ?Patient location during evaluation: PACU ?Anesthesia Type: MAC ?Level of consciousness: awake and alert ?Pain management: pain level controlled ?Vital Signs Assessment: post-procedure vital signs reviewed and stable ?Respiratory status: spontaneous breathing, nonlabored ventilation, respiratory function stable and patient connected to nasal cannula oxygen ?Cardiovascular status: stable and blood pressure returned to baseline ?Postop Assessment: no apparent nausea or vomiting ?Anesthetic complications: no ? ? ?No notable events documented. ? ?Last Vitals:  ?Vitals:  ? 07/10/21 0845 07/10/21 0900  ?BP: (!) 149/62 135/64  ?Pulse: (!) 56 (!) 52  ?Resp: 16 13  ?Temp:  36.4 ?C  ?SpO2: 96% 95%  ?  ?Last Pain:  ?Vitals:  ? 07/10/21 0900  ?TempSrc:   ?PainSc: 0-No pain  ? ? ?  ?  ?  ?  ?  ?  ? ?Shaida Route ? ? ? ? ?

## 2021-07-18 ENCOUNTER — Encounter (HOSPITAL_COMMUNITY): Payer: Medicare HMO

## 2021-07-18 DIAGNOSIS — Z7901 Long term (current) use of anticoagulants: Secondary | ICD-10-CM | POA: Diagnosis not present

## 2021-07-18 DIAGNOSIS — U099 Post covid-19 condition, unspecified: Secondary | ICD-10-CM | POA: Diagnosis not present

## 2021-07-18 DIAGNOSIS — I480 Paroxysmal atrial fibrillation: Secondary | ICD-10-CM | POA: Diagnosis not present

## 2021-07-18 DIAGNOSIS — R002 Palpitations: Secondary | ICD-10-CM | POA: Diagnosis not present

## 2021-08-02 ENCOUNTER — Other Ambulatory Visit: Payer: Self-pay

## 2021-08-02 DIAGNOSIS — N185 Chronic kidney disease, stage 5: Secondary | ICD-10-CM

## 2021-08-05 ENCOUNTER — Encounter: Payer: Self-pay | Admitting: Family Medicine

## 2021-08-11 ENCOUNTER — Ambulatory Visit (INDEPENDENT_AMBULATORY_CARE_PROVIDER_SITE_OTHER): Payer: Medicare HMO | Admitting: Vascular Surgery

## 2021-08-11 ENCOUNTER — Ambulatory Visit (HOSPITAL_COMMUNITY)
Admission: RE | Admit: 2021-08-11 | Discharge: 2021-08-11 | Disposition: A | Discharge status: Home / Self Care | Payer: Medicare HMO | Source: Ambulatory Visit | Attending: Vascular Surgery | Admitting: Vascular Surgery

## 2021-08-11 VITALS — BP 144/66 | HR 63 | Temp 98.3°F | Resp 14 | Ht 65.0 in | Wt 191.0 lb

## 2021-08-11 DIAGNOSIS — N185 Chronic kidney disease, stage 5: Secondary | ICD-10-CM

## 2021-08-11 NOTE — Progress Notes (Signed)
?Office Note  ? ? ?HPI: Brittany Charles is a 71 y.o. ([birth year]) female presenting in follow up s/p left-sided brachiocephalic fistula creation on 05/27/2021, subsequent branch ligation on 07/10/2021. ? ?Since surgery, she has been doing well with no complaints.  She denies numbness or tingling in the hand.  Normal motor and sensation.  Though stage 5 CKD, Brittany Charles has not needed dialysis.  No issues with wound healing, no erythema, no induration.  She denies fevers, chills. ? ? ?Past Medical History:  ?Diagnosis Date  ? Anemia   ? Arthritis   ? CHF (congestive heart failure) ([place])   ? Colon polyps   ? COVID 2020  ? hospitalized at [facility]  ? Fibroids   ? GERD (gastroesophageal reflux disease)   ? Hypertension   ? Lupus (systemic lupus erythematosus) ([place])   ? Myocardial infarction [facility])   ? Pneumonia   ? Renal insufficiency   ? CKD stage 5- not on dialysis (07/08/21)  ? ? ?Past Surgical History:  ?Procedure Laterality Date  ? ABDOMINAL HYSTERECTOMY  10/27/2000  ? TAH, BSO  ? AV FISTULA PLACEMENT Left 05/27/2021  ? Procedure: LEFT BRACHIOCEPHALIC ARTERIOVENOUS (AV) FISTULA CREATION;  Surgeon: Broadus John, MD;  Location: [place];  Service: Vascular;  Laterality: Left;  with peripheral nerve block  ? LEFT AND RIGHT HEART CATHETERIZATION WITH CORONARY ANGIOGRAM Right 04/21/2011  ? Procedure: LEFT AND RIGHT HEART CATHETERIZATION WITH CORONARY ANGIOGRAM;  Surgeon: [name], MD;  Location: [facility] CATH LAB;  Service: Cardiovascular;  Laterality: Right;  ? LIGATION OF ARTERIOVENOUS  FISTULA Left 07/10/2021  ? Procedure: LEFT ARM BRANCH LIGATION OF ARTERIOVENOUS  FISTULA;  Surgeon: Broadus John, MD;  Location: [facility] OR;  Service: Vascular;  Laterality: Left;  PERIPHERAL NERVE BLOCK  ? TONSILLECTOMY    ? ? ?Social History  ? ?Socioeconomic History  ? Marital status: Single  ?  Spouse name: Not on file  ? Number of children: 1  ? Years of education: Not on file  ? Highest education level: Not on file  ?Occupational History   ? Occupation: retired Therapist, sports  ?Tobacco Use  ? Smoking status: Never  ? Smokeless tobacco: Never  ?Vaping Use  ? Vaping Use: Never used  ?Substance and Sexual Activity  ? Alcohol use: No  ? Drug use: No  ? Sexual activity: Never  ?Other Topics Concern  ? Not on file  ?Social History Narrative  ? Not on file  ? ?Social Determinants of Health  ? ?Financial Resource Strain: Not on file  ?Food Insecurity: Not on file  ?Transportation Needs: Not on file  ?Physical Activity: Not on file  ?Stress: Not on file  ?Social Connections: Not on file  ?Intimate Partner Violence: Not on file  ? ? ?Family History  ?Problem Relation Age of Onset  ? Hypertension Mother   ? Heart disease Mother   ?     CHF  ? Stroke Mother   ? Diabetes Mother   ? Hyperlipidemia Father   ? Stroke Father   ? Hypertension Father   ? Asthma Brother   ? Kidney disease Brother   ? Stroke Maternal Grandmother   ? Colon cancer Other   ?     several cousins  ? Breast cancer Other   ? Colon cancer Son   ? Hypertension Son   ? ? ?Current Outpatient Medications  ?Medication Sig Dispense Refill  ? acetaminophen (TYLENOL) 325 MG tablet Take 650 mg by mouth every 6 (six) hours  as needed for moderate pain.    ? albuterol (VENTOLIN HFA) 108 (90 Base) MCG/ACT inhaler Inhale 2 puffs into the lungs every 6 (six) hours as needed for wheezing or shortness of breath. 8 g 2  ? allopurinol (ZYLOPRIM) 100 MG tablet TAKE 1 TABLET EVERY DAY 90 tablet 0  ? apixaban (ELIQUIS) 5 MG TABS tablet Take 1 tablet (5 mg total) by mouth 2 (two) times daily. 60 tablet 5  ? ascorbic acid (VITAMIN C) 500 MG tablet Take 1 tablet (500 mg total) by mouth daily. 30 tablet 0  ? aspirin EC 81 MG tablet Take 81 mg by mouth daily. Swallow whole.    ? calcitRIOL (ROCALTROL) 0.25 MCG capsule Take 0.25 mcg by mouth daily.    ? DILT-XR 180 MG 24 hr capsule Take 180 mg by mouth daily.    ? doxazosin (CARDURA) 2 MG tablet Take 2 mg by mouth at bedtime.    ? furosemide (LASIX) 80 MG tablet Take 80 mg by  mouth daily as needed (swelling).    ? hydrALAZINE (APRESOLINE) 50 MG tablet TAKE 1 TABLET THREE TIMES DAILY 270 tablet 0  ? Menthol, Topical Analgesic, (BIOFREEZE) 4 % GEL Apply 1 application topically daily as needed (pain).    ? metoprolol succinate (TOPROL-XL) 25 MG 24 hr tablet Take 1 tablet (25 mg total) by mouth daily. Take with or immediately following a meal. 90 tablet 3  ? oxyCODONE-acetaminophen (PERCOCET/ROXICET) 5-325 MG tablet Take 1 tablet by mouth every 6 (six) hours as needed. (Patient not taking: Reported on 08/11/2021) 20 tablet 0  ? predniSONE (DELTASONE) 50 MG tablet Take 25 mg by mouth daily as needed (gout flare).    ? rosuvastatin (CRESTOR) 20 MG tablet Take 1 tablet (20 mg total) by mouth daily. 90 tablet 3  ? sodium bicarbonate 650 MG tablet Take 1,300 mg by mouth 2 (two) times daily.    ? zinc sulfate 220 (50 Zn) MG capsule Take 1 capsule (220 mg total) by mouth daily. 30 capsule 0  ? ?No current facility-administered medications for this visit.  ? ? ?Allergies  ?Allergen Reactions  ? Zithromax [Azithromycin] Nausea And Vomiting  ? ? ? ?REVIEW OF SYSTEMS:  ? ?'[X]'$  denotes positive finding, '[ ]'$  denotes negative finding ?Cardiac  Comments:  ?Chest pain or chest pressure:    ?Shortness of breath upon exertion:    ?Short of breath when lying flat:    ?Irregular heart rhythm:    ?    ?Vascular    ?Pain in calf, thigh, or hip brought on by ambulation:    ?Pain in feet at night that wakes you up from your sleep:     ?Blood clot in your veins:    ?Leg swelling:     ?    ?Pulmonary    ?Oxygen at home:    ?Productive cough:     ?Wheezing:     ?    ?Neurologic    ?Sudden weakness in arms or legs:     ?Sudden numbness in arms or legs:     ?Sudden onset of difficulty speaking or slurred speech:    ?Temporary loss of vision in one eye:     ?Problems with dizziness:     ?    ?Gastrointestinal    ?Blood in stool:     ?Vomited blood:     ?    ?Genitourinary    ?Burning when urinating:     ?Blood in urine:     ?    ?  Psychiatric    ?Major depression:     ?    ?Hematologic    ?Bleeding problems:    ?Problems with blood clotting too easily:    ?    ?Skin    ?Rashes or ulcers:    ?    ?Constitutional    ?Fever or chills:    ? ? ?PHYSICAL EXAMINATION: ? ?Vitals:  ? 08/11/21 1152  ?BP: (!) 144/66  ?Pulse: 63  ?Resp: 14  ?Temp: 98.3 ?F (36.8 ?C)  ?TempSrc: Temporal  ?SpO2: 100%  ?Weight: 191 lb (86.6 kg)  ?Height: '5\' 5"'$  (1.651 m)  ? ? ?General:  WDWN in NAD; vital signs documented above ?Gait: Not observed ?HENT: WNL, normocephalic ?Pulmonary: normal non-labored breathing , without wheezing ?Cardiac: regular HR, ?Abdomen: soft, NT, no masses ?Skin: without rashes ?Vascular Exam/Pulses: ? Right Left  ?Radial 2+ (normal) 2+ (normal)  ?Ulnar 1+ (weak) 1+ (weak)  ?    ?    ?    ?    ? ?Extremities: without ischemic changes, without Gangrene , without cellulitis; without open wounds;  ?Palpable thrill ?Musculoskeletal: no muscle wasting or atrophy  ?Neurologic: A&O X 3;  No focal weakness or paresthesias are detected ?Psychiatric:  The pt has Normal affect. ? ? ?Non-Invasive Vascular Imaging:   ?Summary:  ?.  ? Patent left Brachio-cephalic AVF with increased velocity at the  ?antecubital fossa at area of tortuosity.  ? ? ? ?ASSESSMENT/PLAN: Brittany Charles is a 71 y.o. female presenting status post left arm brachiocephalic fistula creation, follow up branch ligation.  This site is healed nicely.  There is a excellent thrill in the antecubital fossa.  Imaging was reviewed demonstrating no stenosis within the fistula.  It is of adequate size, flow, depth for access. ? ?Marlaina's fistula is ready should she need it.  I asked that she call my office with any questions or concerns. ? ? ?Broadus John, MD ?[facility] ?[phone]  ?

## 2021-08-14 DIAGNOSIS — N2581 Secondary hyperparathyroidism of renal origin: Secondary | ICD-10-CM | POA: Diagnosis not present

## 2021-08-14 DIAGNOSIS — E785 Hyperlipidemia, unspecified: Secondary | ICD-10-CM | POA: Diagnosis not present

## 2021-08-14 DIAGNOSIS — E872 Acidosis, unspecified: Secondary | ICD-10-CM | POA: Diagnosis not present

## 2021-08-14 DIAGNOSIS — M329 Systemic lupus erythematosus, unspecified: Secondary | ICD-10-CM | POA: Diagnosis not present

## 2021-08-14 DIAGNOSIS — R7302 Impaired glucose tolerance (oral): Secondary | ICD-10-CM | POA: Diagnosis not present

## 2021-08-14 DIAGNOSIS — D649 Anemia, unspecified: Secondary | ICD-10-CM | POA: Diagnosis not present

## 2021-08-14 DIAGNOSIS — I120 Hypertensive chronic kidney disease with stage 5 chronic kidney disease or end stage renal disease: Secondary | ICD-10-CM | POA: Diagnosis not present

## 2021-08-14 DIAGNOSIS — N185 Chronic kidney disease, stage 5: Secondary | ICD-10-CM | POA: Diagnosis not present

## 2021-08-15 ENCOUNTER — Encounter (HOSPITAL_COMMUNITY): Payer: Medicare HMO

## 2021-08-26 ENCOUNTER — Encounter: Payer: Self-pay | Admitting: Gastroenterology

## 2021-08-26 ENCOUNTER — Encounter (HOSPITAL_COMMUNITY): Payer: Medicare HMO

## 2021-08-26 ENCOUNTER — Encounter (HOSPITAL_COMMUNITY): Payer: Self-pay | Admitting: Gastroenterology

## 2021-08-26 ENCOUNTER — Other Ambulatory Visit: Payer: Self-pay | Admitting: *Deleted

## 2021-08-26 ENCOUNTER — Encounter (HOSPITAL_COMMUNITY)
Admission: RE | Admit: 2021-08-26 | Discharge: 2021-08-26 | Disposition: A | Discharge status: Home / Self Care | Payer: Medicare HMO | Source: Ambulatory Visit | Attending: Internal Medicine | Admitting: Internal Medicine

## 2021-08-26 VITALS — BP 163/56 | HR 64 | Resp 18

## 2021-08-26 DIAGNOSIS — N185 Chronic kidney disease, stage 5: Secondary | ICD-10-CM | POA: Diagnosis not present

## 2021-08-26 DIAGNOSIS — Z86010 Personal history of colonic polyps: Secondary | ICD-10-CM

## 2021-08-26 LAB — POCT HEMOGLOBIN-HEMACUE: Hemoglobin: 9.4 g/dL — ABNORMAL LOW (ref 12.0–15.0)

## 2021-08-26 LAB — IRON AND TIBC
Iron: 35 ug/dL (ref 28–170)
Saturation Ratios: 16 % (ref 10.4–31.8)
TIBC: 224 ug/dL — ABNORMAL LOW (ref 250–450)
UIBC: 189 ug/dL

## 2021-08-26 LAB — FERRITIN: Ferritin: 77 ng/mL (ref 11–307)

## 2021-08-26 MED ORDER — EPOETIN ALFA-EPBX 10000 UNIT/ML IJ SOLN
INTRAMUSCULAR | Status: AC
  Filled 2021-08-26: qty 1

## 2021-08-26 MED ORDER — EPOETIN ALFA-EPBX 10000 UNIT/ML IJ SOLN
10000.0000 [IU] | INTRAMUSCULAR | Status: DC
  Administered 2021-08-26: 10000 [IU] via SUBCUTANEOUS

## 2021-08-26 NOTE — Progress Notes (Signed)
Attempted to obtain medical history via telephone, unable to reach at this time. I left a voicemail to return pre surgical testing department's phone call.  

## 2021-08-27 ENCOUNTER — Other Ambulatory Visit: Payer: Self-pay | Admitting: Cardiology

## 2021-08-27 DIAGNOSIS — I480 Paroxysmal atrial fibrillation: Secondary | ICD-10-CM

## 2021-08-27 NOTE — Telephone Encounter (Signed)
Prescription refill request for Eliquis received. ?Indication: Afib  ?Last office visit:12/10/20 [facility]) ?Scr: 4.27 (05/23/21) ?Age: 71 ?Weight: 86.6kg ? ?Appropriate dose and refill sent to requested pharmacy.  ?

## 2021-09-01 NOTE — Anesthesia Preprocedure Evaluation (Addendum)
Anesthesia Evaluation  ?Patient identified by MRN, date of birth, ID band ?Patient awake ? ? ? ?Reviewed: ?Allergy & Precautions, NPO status , Patient's Chart, lab work & pertinent test results, reviewed documented beta blocker date and time  ? ?History of Anesthesia Complications ?Negative for: history of anesthetic complications ? ?Airway ?Mallampati: II ? ?TM Distance: >3 FB ?Neck ROM: Full ? ? ? Dental ?no notable dental hx. ? ?  ?Pulmonary ?neg pulmonary ROS,  ?  ?Pulmonary exam normal ? ? ? ? ? ? ? Cardiovascular ?hypertension, Pt. on medications and Pt. on home beta blockers ?+ CAD, + Past MI and +CHF  ?Normal cardiovascular exam ? ?TTE 2021: EF 65-70%, valves ok ?  ?Neuro/Psych ?negative neurological ROS ? negative psych ROS  ? GI/Hepatic ?Neg liver ROS, GERD  Controlled,  ?Endo/Other  ?negative endocrine ROS ? Renal/GU ?Renal Insufficiency and ESRFRenal disease  ?negative genitourinary ?  ?Musculoskeletal ? ?(+) Arthritis ,  ? Abdominal ?  ?Peds ? Hematology ? ?(+) Blood dyscrasia (Hgb 9.4), anemia ,   ?Anesthesia Other Findings ?SLE ? Reproductive/Obstetrics ?negative OB ROS ? ?  ? ? ? ? ? ? ? ? ? ? ? ? ? ?  ?  ? ? ? ? ? ? ? ?Anesthesia Physical ?Anesthesia Plan ? ?ASA: 4 ? ?Anesthesia Plan: MAC  ? ?Post-op Pain Management: Minimal or no pain anticipated  ? ?Induction:  ? ?PONV Risk Score and Plan: 2 and Treatment may vary due to age or medical condition and Propofol infusion ? ?Airway Management Planned: Natural Airway and Simple Face Mask ? ?Additional Equipment: None ? ?Intra-op Plan:  ? ?Post-operative Plan:  ? ?Informed Consent: I have reviewed the patients History and Physical, chart, labs and discussed the procedure including the risks, benefits and alternatives for the proposed anesthesia with the patient or authorized representative who has indicated his/her understanding and acceptance.  ? ? ? ? ? ?Plan Discussed with: CRNA ? ?Anesthesia Plan Comments:    ? ? ? ? ? ?Anesthesia Quick Evaluation ? ?

## 2021-09-02 ENCOUNTER — Ambulatory Visit (HOSPITAL_COMMUNITY): Payer: Medicare HMO | Admitting: Anesthesiology

## 2021-09-02 ENCOUNTER — Ambulatory Visit (HOSPITAL_COMMUNITY)
Admission: RE | Admit: 2021-09-02 | Discharge: 2021-09-02 | Disposition: A | Discharge status: Home / Self Care | Payer: Medicare HMO | Attending: Gastroenterology | Admitting: Gastroenterology

## 2021-09-02 ENCOUNTER — Encounter (HOSPITAL_COMMUNITY)
Admission: RE | Disposition: A | Discharge status: Home / Self Care | Payer: Self-pay | Source: Home / Self Care | Attending: Gastroenterology

## 2021-09-02 ENCOUNTER — Other Ambulatory Visit: Payer: Self-pay

## 2021-09-02 ENCOUNTER — Ambulatory Visit (HOSPITAL_BASED_OUTPATIENT_CLINIC_OR_DEPARTMENT_OTHER): Payer: Medicare HMO | Admitting: Anesthesiology

## 2021-09-02 ENCOUNTER — Encounter (HOSPITAL_COMMUNITY): Payer: Self-pay | Admitting: Gastroenterology

## 2021-09-02 DIAGNOSIS — I4891 Unspecified atrial fibrillation: Secondary | ICD-10-CM | POA: Diagnosis not present

## 2021-09-02 DIAGNOSIS — D123 Benign neoplasm of transverse colon: Secondary | ICD-10-CM | POA: Diagnosis not present

## 2021-09-02 DIAGNOSIS — Z7901 Long term (current) use of anticoagulants: Secondary | ICD-10-CM | POA: Insufficient documentation

## 2021-09-02 DIAGNOSIS — K635 Polyp of colon: Secondary | ICD-10-CM

## 2021-09-02 DIAGNOSIS — Z86010 Personal history of colonic polyps: Secondary | ICD-10-CM

## 2021-09-02 DIAGNOSIS — K219 Gastro-esophageal reflux disease without esophagitis: Secondary | ICD-10-CM | POA: Diagnosis not present

## 2021-09-02 DIAGNOSIS — K648 Other hemorrhoids: Secondary | ICD-10-CM | POA: Insufficient documentation

## 2021-09-02 DIAGNOSIS — Z1211 Encounter for screening for malignant neoplasm of colon: Secondary | ICD-10-CM | POA: Insufficient documentation

## 2021-09-02 DIAGNOSIS — I132 Hypertensive heart and chronic kidney disease with heart failure and with stage 5 chronic kidney disease, or end stage renal disease: Secondary | ICD-10-CM | POA: Insufficient documentation

## 2021-09-02 DIAGNOSIS — K5730 Diverticulosis of large intestine without perforation or abscess without bleeding: Secondary | ICD-10-CM

## 2021-09-02 DIAGNOSIS — I252 Old myocardial infarction: Secondary | ICD-10-CM | POA: Insufficient documentation

## 2021-09-02 DIAGNOSIS — I2510 Atherosclerotic heart disease of native coronary artery without angina pectoris: Secondary | ICD-10-CM | POA: Diagnosis not present

## 2021-09-02 DIAGNOSIS — M329 Systemic lupus erythematosus, unspecified: Secondary | ICD-10-CM | POA: Insufficient documentation

## 2021-09-02 DIAGNOSIS — N186 End stage renal disease: Secondary | ICD-10-CM

## 2021-09-02 DIAGNOSIS — Z940 Kidney transplant status: Secondary | ICD-10-CM | POA: Diagnosis not present

## 2021-09-02 DIAGNOSIS — I509 Heart failure, unspecified: Secondary | ICD-10-CM | POA: Insufficient documentation

## 2021-09-02 DIAGNOSIS — Z7682 Awaiting organ transplant status: Secondary | ICD-10-CM

## 2021-09-02 DIAGNOSIS — Z992 Dependence on renal dialysis: Secondary | ICD-10-CM

## 2021-09-02 DIAGNOSIS — D121 Benign neoplasm of appendix: Secondary | ICD-10-CM | POA: Diagnosis not present

## 2021-09-02 HISTORY — PX: COLONOSCOPY WITH PROPOFOL: SHX5780

## 2021-09-02 HISTORY — PX: BIOPSY: SHX5522

## 2021-09-02 HISTORY — PX: POLYPECTOMY: SHX5525

## 2021-09-02 LAB — POCT I-STAT, CHEM 8
BUN: 48 mg/dL — ABNORMAL HIGH (ref 8–23)
Calcium, Ion: 1.16 mmol/L (ref 1.15–1.40)
Chloride: 109 mmol/L (ref 98–111)
Creatinine, Ser: 4.5 mg/dL — ABNORMAL HIGH (ref 0.44–1.00)
Glucose, Bld: 99 mg/dL (ref 70–99)
HCT: 32 % — ABNORMAL LOW (ref 36.0–46.0)
Hemoglobin: 10.9 g/dL — ABNORMAL LOW (ref 12.0–15.0)
Potassium: 3.9 mmol/L (ref 3.5–5.1)
Sodium: 141 mmol/L (ref 135–145)
TCO2: 20 mmol/L — ABNORMAL LOW (ref 22–32)

## 2021-09-02 SURGERY — COLONOSCOPY WITH PROPOFOL
Anesthesia: Monitor Anesthesia Care

## 2021-09-02 MED ORDER — PROPOFOL 10 MG/ML IV BOLUS
INTRAVENOUS | Status: DC | PRN
  Administered 2021-09-02: 30 mg via INTRAVENOUS
  Administered 2021-09-02: 20 mg via INTRAVENOUS
  Administered 2021-09-02: 40 mg via INTRAVENOUS

## 2021-09-02 MED ORDER — SODIUM CHLORIDE 0.9 % IV SOLN: INTRAVENOUS | Status: DC

## 2021-09-02 MED ORDER — SODIUM CHLORIDE 0.9 % IV SOLN: 510.0000 mg | INTRAVENOUS | Status: DC

## 2021-09-02 MED ORDER — LIDOCAINE 2% (20 MG/ML) 5 ML SYRINGE
INTRAMUSCULAR | Status: DC | PRN
  Administered 2021-09-02: 40 mg via INTRAVENOUS

## 2021-09-02 MED ORDER — PROPOFOL 500 MG/50ML IV EMUL
INTRAVENOUS | Status: DC | PRN
  Administered 2021-09-02: 70 ug/kg/min via INTRAVENOUS

## 2021-09-02 SURGICAL SUPPLY — 22 items

## 2021-09-02 NOTE — Discharge Instructions (Signed)

## 2021-09-02 NOTE — H&P (Signed)
[facility] History and Physical ? ? ?Primary Care Physician:  Wendie Agreste, MD ? ? ?Reason for Procedure:   H/o adenomatous colon polyps ? ?Plan:    Colonoscopy with possible intervention ? ? ? ? ?HPI: Brittany Charles is a 71 y.o. female here for surveillance colonoscopy for h/o adenomatous colon polyps. ?ESRD , Afib on chronic anticoagulation, eliquis on hold for 1 day. ? ?Please refer to office note 06/26/21 for details ? ?The risks and benefits as well as alternatives of endoscopic procedure(s) have been discussed and reviewed. All questions answered. The patient agrees to proceed. ? ? ? ? ?Past Medical History:  ?Diagnosis Date  ? Anemia   ? Arthritis   ? CHF (congestive heart failure) ([place])   ? Colon polyps   ? COVID 2020  ? hospitalized at [facility]  ? Fibroids   ? GERD (gastroesophageal reflux disease)   ? Hypertension   ? Lupus (systemic lupus erythematosus) ([place])   ? Myocardial infarction [facility])   ? Pneumonia   ? Renal insufficiency   ? CKD stage 5- not on dialysis (07/08/21)  ? ? ?Past Surgical History:  ?Procedure Laterality Date  ? ABDOMINAL HYSTERECTOMY  10/27/2000  ? TAH, BSO  ? AV FISTULA PLACEMENT Left 05/27/2021  ? Procedure: LEFT BRACHIOCEPHALIC ARTERIOVENOUS (AV) FISTULA CREATION;  Surgeon: Broadus John, MD;  Location: [place];  Service: Vascular;  Laterality: Left;  with peripheral nerve block  ? LEFT AND RIGHT HEART CATHETERIZATION WITH CORONARY ANGIOGRAM Right 04/21/2011  ? Procedure: LEFT AND RIGHT HEART CATHETERIZATION WITH CORONARY ANGIOGRAM;  Surgeon: [name], MD;  Location: [facility] CATH LAB;  Service: Cardiovascular;  Laterality: Right;  ? LIGATION OF ARTERIOVENOUS  FISTULA Left 07/10/2021  ? Procedure: LEFT ARM BRANCH LIGATION OF ARTERIOVENOUS  FISTULA;  Surgeon: Broadus John, MD;  Location: [facility] OR;  Service: Vascular;  Laterality: Left;  PERIPHERAL NERVE BLOCK  ? TONSILLECTOMY    ? ? ?Prior to Admission medications   ?Medication Sig Start Date End Date Taking?  Authorizing Provider  ?albuterol (VENTOLIN HFA) 108 (90 Base) MCG/ACT inhaler Inhale 2 puffs into the lungs every 6 (six) hours as needed for wheezing or shortness of breath. 01/04/20  Yes Rai, Ripudeep K, MD  ?ascorbic acid (VITAMIN C) 500 MG tablet Take 1 tablet (500 mg total) by mouth daily. 01/05/20  Yes Rai, Ripudeep K, MD  ?aspirin EC 81 MG tablet Take 81 mg by mouth daily. Swallow whole.   Yes [provider]  ?calcitRIOL (ROCALTROL) 0.25 MCG capsule Take 0.25 mcg by mouth daily. 12/15/19  Yes [provider]  ?Cholecalciferol (VITAMIN D3 PO) Take 1 tablet by mouth daily.   Yes [provider]  ?DILT-XR 180 MG 24 hr capsule Take 180 mg by mouth daily. 09/27/20  Yes [provider]  ?doxazosin (CARDURA) 2 MG tablet Take 2 mg by mouth 2 (two) times daily. 11/06/20  Yes [provider]  ?ELIQUIS 5 MG TABS tablet TAKE 1 TABLET(5 MG) BY MOUTH TWICE DAILY 08/27/21   Jerline Pain, MD  ?furosemide (LASIX) 80 MG tablet Take 80 mg by mouth daily as needed (swelling).   Yes [provider]  ?hydrALAZINE (APRESOLINE) 50 MG tablet TAKE 1 TABLET THREE TIMES DAILY 10/10/15  Yes Posey Boyer, MD  ?Menthol, Topical Analgesic, (BIOFREEZE) 4 % GEL Apply 1 application. topically 3 (three) times daily as needed (pain).   Yes [provider]  ?metoprolol succinate (TOPROL-XL) 25 MG 24 hr tablet Take  1 tablet (25 mg total) by mouth daily. Take with or immediately following a meal. 03/07/21  Yes Kc, Ramesh, MD  ?rosuvastatin (CRESTOR) 20 MG tablet Take 1 tablet (20 mg total) by mouth daily. 12/10/20  Yes Imogene Burn, PA-C  ?sodium bicarbonate 650 MG tablet Take 1,300 mg by mouth 2 (two) times daily. 02/18/21  Yes [provider]  ?zinc sulfate 220 (50 Zn) MG capsule Take 1 capsule (220 mg total) by mouth daily. 01/05/20  Yes Rai, Ripudeep K, MD  ?acetaminophen (TYLENOL) 325 MG tablet Take 650 mg by mouth every 6 (six) hours as needed for moderate pain.     [provider]  ?allopurinol (ZYLOPRIM) 100 MG tablet TAKE 1 TABLET EVERY DAY 10/10/15   Posey Boyer, MD  ?oxyCODONE-acetaminophen (PERCOCET/ROXICET) 5-325 MG tablet Take 1 tablet by mouth every 6 (six) hours as needed. ?Patient not taking: Reported on 08/11/2021 07/10/21   Ulyses Amor, PA-C  ? ? ?Current Facility-Administered Medications  ?Medication Dose Route Frequency Provider Last Rate Last Admin  ? 0.9 %  sodium chloride infusion   Intravenous Continuous Mauri Pole, MD 20 mL/hr at 09/02/21 0717 New Bag at 09/02/21 0717  ? ferumoxytol (FERAHEME) 510 mg in sodium chloride 0.9 % 100 mL IVPB  510 mg Intravenous Weekly Justin Mend, MD      ? ? ?Allergies as of 06/26/2021 - Review Complete 06/26/2021  ?Allergen Reaction Noted  ? Zithromax [azithromycin] Nausea And Vomiting 04/17/2011  ? ? ?Family History  ?Problem Relation Age of Onset  ? Hypertension Mother   ? Heart disease Mother   ?     CHF  ? Stroke Mother   ? Diabetes Mother   ? Hyperlipidemia Father   ? Stroke Father   ? Hypertension Father   ? Asthma Brother   ? Kidney disease Brother   ? Stroke Maternal Grandmother   ? Colon cancer Other   ?     several cousins  ? Breast cancer Other   ? Colon cancer Son   ? Hypertension Son   ? ? ?Social History  ? ?Socioeconomic History  ? Marital status: Single  ?  Spouse name: Not on file  ? Number of children: 1  ? Years of education: Not on file  ? Highest education level: Not on file  ?Occupational History  ? Occupation: retired Therapist, sports  ?Tobacco Use  ? Smoking status: Never  ? Smokeless tobacco: Never  ?Vaping Use  ? Vaping Use: Never used  ?Substance and Sexual Activity  ? Alcohol use: No  ? Drug use: No  ? Sexual activity: Never  ?Other Topics Concern  ? Not on file  ?Social History Narrative  ? Not on file  ? ?Social Determinants of Health  ? ?Financial Resource Strain: Not on file  ?Food Insecurity: Not on file  ?Transportation Needs: Not on file  ?Physical Activity: Not on file   ?Stress: Not on file  ?Social Connections: Not on file  ?Intimate Partner Violence: Not on file  ? ? ?Review of Systems: ? ?All other review of systems negative except as mentioned in the HPI. ? ?Physical Exam: ?Vital signs in last 24 hours: ?Temp:  [98.2 ?F (36.8 ?C)] 98.2 ?F (36.8 ?C) (04/25 8916) ?Pulse Rate:  [68] 68 (04/25 0658) ?Resp:  [20] 20 (04/25 9450) ?SpO2:  [98 %] 98 % (04/25 0658) ?Weight:  [87.1 kg] 87.1 kg (04/25 0658) ?  ?General:   Alert, NAD ?Lungs:  Clear .   ?  Heart:  Regular rate and rhythm ?Abdomen:  Soft, nontender and nondistended. ?Neuro/Psych:  Alert and cooperative. Normal mood and affect. A and O x 3 ? ? ?K. Denzil Magnuson , MD ?[phone]  ? ? ?  ?

## 2021-09-02 NOTE — Op Note (Signed)
[facility] ?Patient Name: Brittany Charles ?Procedure Date: 09/02/2021 ?MRN: [id number] ?Attending MD: Mauri Pole , MD ?Date of Birth: [birth year] ?CSN: [id number] ?Age: 71 ?Admit Type: Outpatient ?Procedure:                Colonoscopy ?Indications:              High risk colon cancer surveillance: Personal  ?                          history of colonic polyps, High risk colon cancer  ?                          surveillance: Personal history of adenoma (10 mm or  ?                          greater in size), High risk colon cancer  ?                          surveillance: Personal history of multiple (10 or  ?                          more) adenomas ?Providers:                Mauri Pole, MD, Ladoris Gene, RN,  ?                          Despina Pole, Technician, Cleda Daub, CRNA ?Referring MD:              ?Medicines:                Monitored Anesthesia Care ?Complications:            No immediate complications. ?Estimated Blood Loss:     Estimated blood loss was minimal. ?Procedure:                Pre-Anesthesia Assessment: ?                          - Prior to the procedure, a History and Physical  ?                          was performed, and patient medications and  ?                          allergies were reviewed. The patient's tolerance of  ?                          previous anesthesia was also reviewed. The risks  ?                          and benefits of the procedure and the sedation  ?                          options and risks were discussed with the patient.  ?  All questions were answered, and informed consent  ?                          was obtained. Prior Anticoagulants: The patient  ?                          last took Eliquis (apixaban) 2 days prior to the  ?                          procedure. ASA Grade Assessment: IV - A patient  ?                          with severe systemic disease that is a constant  ?                           threat to life. After reviewing the risks and  ?                          benefits, the patient was deemed in satisfactory  ?                          condition to undergo the procedure. ?                          After obtaining informed consent, the colonoscope  ?                          was passed under direct vision. Throughout the  ?                          procedure, the patient's blood pressure, pulse, and  ?                          oxygen saturations were monitored continuously. The  ?                          [id number]-HQ190L ([id number]) Olympus colonoscope was  ?                          introduced through the anus and advanced to the the  ?                          cecum, identified by appendiceal orifice and  ?                          ileocecal valve. The colonoscopy was performed  ?                          without difficulty. The patient tolerated the  ?                          procedure well. The quality of the bowel  ?  preparation was good. The ileocecal valve,  ?                          appendiceal orifice, and rectum were photographed. ?Scope In: 7:59:02 AM ?Scope Out: 8:28:36 AM ?Scope Withdrawal Time: 0 hours 24 minutes 43 seconds  ?Total Procedure Duration: 0 hours 29 minutes 34 seconds  ?Findings: ?     The perianal and digital rectal examinations were normal. ?     A less than 1 mm polyp was found in the appendiceal orifice. The polyp  ?     was sessile. The polyp was removed with a cold biopsy forceps. Resection  ?     and retrieval were complete. ?     Five semi-pedunculated polyps were found in the transverse colon and  ?     hepatic flexure. The polyps were 10 to 18 mm in size. These polyps were  ?     removed with a hot snare. Resection and retrieval were complete. ?     Multiple small-mouthed diverticula were found in the colon. There was no  ?     evidence of diverticular bleeding. ?     Non-bleeding external and internal hemorrhoids were found during  ?      retroflexion. The hemorrhoids were medium-sized. ?Impression:               - One less than 1 mm polyp at the appendiceal  ?                          orifice, removed with a cold biopsy forceps.  ?                          Resected and retrieved. ?                          - Five 10 to 18 mm polyps in the transverse colon  ?                          and at the hepatic flexure, removed with a hot  ?                          snare. Resected and retrieved. ?                          - Moderate diverticulosis. There was no evidence of  ?                          diverticular bleeding. ?                          - Non-bleeding external and internal hemorrhoids. ?Moderate Sedation: ?     Not Applicable - Patient had care per Anesthesia. ?Recommendation:           - Patient has a contact number available for  ?                          emergencies. The signs and symptoms of potential  ?  delayed complications were discussed with the  ?                          patient. Return to normal activities tomorrow.  ?                          Written discharge instructions were provided to the  ?                          patient. ?                          - Resume previous diet. ?                          - Continue present medications. ?                          - Resume Eliquis (apixaban) at prior dose in 2  ?                          days. Refer to managing physician for further  ?                          adjustment of therapy. ?                          - Await pathology results. ?                          - Repeat colonoscopy in 3 years for surveillance  ?                          based on pathology results. ?                          - No aspirin, ibuprofen, naproxen, or other  ?                          non-steroidal anti-inflammatory drugs. ?Procedure Code(s):        --- Professional --- ?                          [phone], Colonoscopy, flexible; with removal of  ?                          tumor(s),  polyp(s), or other lesion(s) by snare  ?                          technique ?                          45380, 59, Colonoscopy, flexible; with biopsy,  ?                          single or multiple ?Diagnosis Code(s):        --- Professional --- ?  K63.5, Polyp of colon ?                          K64.8, Other hemorrhoids ?                          Z86.010, Personal history of colonic polyps ?                          K57.30, Diverticulosis of large intestine without  ?                          perforation or abscess without bleeding ?CPT copyright 2019 American Medical Association. All rights reserved. ?The codes documented in this report are preliminary and upon coder review may  ?be revised to meet current compliance requirements. ?Mauri Pole, MD ?09/02/2021 8:43:15 AM ?This report has been signed electronically. ?Number of Addenda: 0 ?

## 2021-09-02 NOTE — Transfer of Care (Signed)
Immediate Anesthesia Transfer of Care Note ? ?Patient: Brittany Charles ? ?Procedure(s) Performed: COLONOSCOPY WITH PROPOFOL ?BIOPSY ?POLYPECTOMY ? ?Patient Location: PACU ? ?Anesthesia Type:MAC ? ?Level of Consciousness: awake, alert , oriented and patient cooperative ? ?Airway & Oxygen Therapy: Patient Spontanous Breathing and Patient connected to face mask oxygen ? ?Post-op Assessment: Report given to RN and Post -op Vital signs reviewed and stable ? ?Post vital signs: Reviewed and stable ? ?Last Vitals:  ?Vitals Value Taken Time  ?BP 149/37 09/02/21 0839  ?Temp    ?Pulse 64 09/02/21 0840  ?Resp 23 09/02/21 0840  ?SpO2 99 % 09/02/21 0840  ?Vitals shown include unvalidated device data. ? ?Last Pain:  ?Vitals:  ? 09/02/21 0839  ?TempSrc:   ?PainSc: 0-No pain  ?   ? ?  ? ?Complications: No notable events documented. ?

## 2021-09-02 NOTE — Anesthesia Postprocedure Evaluation (Signed)
Anesthesia Post Note ? ?Patient: Brittany Charles ? ?Procedure(s) Performed: COLONOSCOPY WITH PROPOFOL ?BIOPSY ?POLYPECTOMY ? ?  ? ?Patient location during evaluation: PACU ?Anesthesia Type: MAC ?Level of consciousness: awake and alert ?Pain management: pain level controlled ?Vital Signs Assessment: post-procedure vital signs reviewed and stable ?Respiratory status: spontaneous breathing, nonlabored ventilation and respiratory function stable ?Cardiovascular status: blood pressure returned to baseline ?Postop Assessment: no apparent nausea or vomiting ?Anesthetic complications: no ? ? ?No notable events documented. ? ?Last Vitals:  ?Vitals:  ? 09/02/21 0850 09/02/21 0900  ?BP: (!) 173/46 (!) 184/53  ?Pulse: 66 70  ?Resp: (!) 22 (!) 24  ?Temp:    ?SpO2: 97% 95%  ?  ?Last Pain:  ?Vitals:  ? 09/02/21 0900  ?TempSrc:   ?PainSc: 0-No pain  ? ? ?  ?  ?  ?  ?  ?  ? ?Marthenia Rolling ? ? ? ? ?

## 2021-09-03 ENCOUNTER — Encounter (HOSPITAL_COMMUNITY): Payer: Self-pay | Admitting: Gastroenterology

## 2021-09-03 LAB — SURGICAL PATHOLOGY

## 2021-09-05 ENCOUNTER — Encounter: Payer: Self-pay | Admitting: Gastroenterology

## 2021-09-23 ENCOUNTER — Ambulatory Visit (HOSPITAL_COMMUNITY)
Admission: RE | Admit: 2021-09-23 | Discharge: 2021-09-23 | Disposition: A | Discharge status: Home / Self Care | Payer: Medicare HMO | Source: Ambulatory Visit | Attending: Internal Medicine | Admitting: Internal Medicine

## 2021-09-23 VITALS — BP 141/46 | Temp 98.1°F | Resp 20

## 2021-09-23 DIAGNOSIS — N185 Chronic kidney disease, stage 5: Secondary | ICD-10-CM | POA: Insufficient documentation

## 2021-09-23 LAB — IRON AND TIBC
Iron: 31 ug/dL (ref 28–170)
Saturation Ratios: 15 % (ref 10.4–31.8)
TIBC: 207 ug/dL — ABNORMAL LOW (ref 250–450)
UIBC: 176 ug/dL

## 2021-09-23 LAB — FERRITIN: Ferritin: 68 ng/mL (ref 11–307)

## 2021-09-23 LAB — POCT HEMOGLOBIN-HEMACUE: Hemoglobin: 9.9 g/dL — ABNORMAL LOW (ref 12.0–15.0)

## 2021-09-23 MED ORDER — EPOETIN ALFA-EPBX 10000 UNIT/ML IJ SOLN
INTRAMUSCULAR | Status: AC
  Filled 2021-09-23: qty 1

## 2021-09-23 MED ORDER — EPOETIN ALFA-EPBX 10000 UNIT/ML IJ SOLN
10000.0000 [IU] | INTRAMUSCULAR | Status: DC
  Administered 2021-09-23: 10000 [IU] via SUBCUTANEOUS

## 2021-10-09 DIAGNOSIS — N185 Chronic kidney disease, stage 5: Secondary | ICD-10-CM | POA: Diagnosis not present

## 2021-10-09 DIAGNOSIS — N2581 Secondary hyperparathyroidism of renal origin: Secondary | ICD-10-CM | POA: Diagnosis not present

## 2021-10-09 DIAGNOSIS — D649 Anemia, unspecified: Secondary | ICD-10-CM | POA: Diagnosis not present

## 2021-10-09 DIAGNOSIS — M329 Systemic lupus erythematosus, unspecified: Secondary | ICD-10-CM | POA: Diagnosis not present

## 2021-10-09 DIAGNOSIS — I120 Hypertensive chronic kidney disease with stage 5 chronic kidney disease or end stage renal disease: Secondary | ICD-10-CM | POA: Diagnosis not present

## 2021-10-09 DIAGNOSIS — R7302 Impaired glucose tolerance (oral): Secondary | ICD-10-CM | POA: Diagnosis not present

## 2021-10-09 DIAGNOSIS — E872 Acidosis, unspecified: Secondary | ICD-10-CM | POA: Diagnosis not present

## 2021-10-09 DIAGNOSIS — Z01818 Encounter for other preprocedural examination: Secondary | ICD-10-CM | POA: Diagnosis not present

## 2021-10-09 DIAGNOSIS — E785 Hyperlipidemia, unspecified: Secondary | ICD-10-CM | POA: Diagnosis not present

## 2021-10-20 ENCOUNTER — Other Ambulatory Visit (HOSPITAL_COMMUNITY): Payer: Self-pay | Admitting: *Deleted

## 2021-10-21 ENCOUNTER — Inpatient Hospital Stay (HOSPITAL_COMMUNITY): Admission: RE | Admit: 2021-10-21 | Payer: Medicare HMO | Source: Ambulatory Visit

## 2021-11-18 ENCOUNTER — Encounter (HOSPITAL_COMMUNITY): Payer: Medicare HMO

## 2021-11-20 DIAGNOSIS — Z8616 Personal history of COVID-19: Secondary | ICD-10-CM | POA: Diagnosis not present

## 2021-11-20 DIAGNOSIS — Z4822 Encounter for aftercare following kidney transplant: Secondary | ICD-10-CM | POA: Diagnosis not present

## 2021-11-20 DIAGNOSIS — N269 Renal sclerosis, unspecified: Secondary | ICD-10-CM | POA: Diagnosis not present

## 2021-11-20 DIAGNOSIS — I110 Hypertensive heart disease with heart failure: Secondary | ICD-10-CM | POA: Diagnosis not present

## 2021-11-20 DIAGNOSIS — N261 Atrophy of kidney (terminal): Secondary | ICD-10-CM | POA: Diagnosis not present

## 2021-11-20 DIAGNOSIS — D84821 Immunodeficiency due to drugs: Secondary | ICD-10-CM | POA: Diagnosis not present

## 2021-11-20 DIAGNOSIS — Z20822 Contact with and (suspected) exposure to covid-19: Secondary | ICD-10-CM | POA: Diagnosis not present

## 2021-11-20 DIAGNOSIS — Z7952 Long term (current) use of systemic steroids: Secondary | ICD-10-CM | POA: Diagnosis not present

## 2021-11-20 DIAGNOSIS — N185 Chronic kidney disease, stage 5: Secondary | ICD-10-CM | POA: Diagnosis not present

## 2021-11-20 DIAGNOSIS — Z79899 Other long term (current) drug therapy: Secondary | ICD-10-CM | POA: Diagnosis not present

## 2021-11-20 DIAGNOSIS — I5022 Chronic systolic (congestive) heart failure: Secondary | ICD-10-CM | POA: Diagnosis not present

## 2021-11-20 DIAGNOSIS — Z7901 Long term (current) use of anticoagulants: Secondary | ICD-10-CM | POA: Diagnosis not present

## 2021-11-20 DIAGNOSIS — I5032 Chronic diastolic (congestive) heart failure: Secondary | ICD-10-CM | POA: Diagnosis not present

## 2021-11-20 DIAGNOSIS — R918 Other nonspecific abnormal finding of lung field: Secondary | ICD-10-CM | POA: Diagnosis not present

## 2021-11-20 DIAGNOSIS — I480 Paroxysmal atrial fibrillation: Secondary | ICD-10-CM | POA: Diagnosis not present

## 2021-11-20 DIAGNOSIS — I444 Left anterior fascicular block: Secondary | ICD-10-CM | POA: Diagnosis not present

## 2021-11-20 DIAGNOSIS — Z01818 Encounter for other preprocedural examination: Secondary | ICD-10-CM | POA: Diagnosis not present

## 2021-11-20 DIAGNOSIS — D631 Anemia in chronic kidney disease: Secondary | ICD-10-CM | POA: Diagnosis not present

## 2021-11-20 DIAGNOSIS — Z940 Kidney transplant status: Secondary | ICD-10-CM | POA: Diagnosis not present

## 2021-11-20 DIAGNOSIS — M3214 Glomerular disease in systemic lupus erythematosus: Secondary | ICD-10-CM | POA: Diagnosis not present

## 2021-11-20 DIAGNOSIS — I120 Hypertensive chronic kidney disease with stage 5 chronic kidney disease or end stage renal disease: Secondary | ICD-10-CM | POA: Diagnosis not present

## 2021-11-20 DIAGNOSIS — Z79621 Long term (current) use of calcineurin inhibitor: Secondary | ICD-10-CM | POA: Diagnosis not present

## 2021-11-20 DIAGNOSIS — Z9489 Other transplanted organ and tissue status: Secondary | ICD-10-CM | POA: Diagnosis not present

## 2021-11-20 DIAGNOSIS — I10 Essential (primary) hypertension: Secondary | ICD-10-CM | POA: Diagnosis not present

## 2021-11-20 DIAGNOSIS — R760 Raised antibody titer: Secondary | ICD-10-CM | POA: Diagnosis not present

## 2021-11-20 DIAGNOSIS — E872 Acidosis, unspecified: Secondary | ICD-10-CM | POA: Diagnosis not present

## 2021-11-20 DIAGNOSIS — N179 Acute kidney failure, unspecified: Secondary | ICD-10-CM | POA: Diagnosis not present

## 2021-11-20 DIAGNOSIS — N186 End stage renal disease: Secondary | ICD-10-CM | POA: Diagnosis not present

## 2021-11-20 DIAGNOSIS — M109 Gout, unspecified: Secondary | ICD-10-CM | POA: Diagnosis not present

## 2021-11-21 DIAGNOSIS — N185 Chronic kidney disease, stage 5: Secondary | ICD-10-CM | POA: Diagnosis not present

## 2021-11-21 DIAGNOSIS — R760 Raised antibody titer: Secondary | ICD-10-CM | POA: Diagnosis not present

## 2021-11-21 DIAGNOSIS — Z4822 Encounter for aftercare following kidney transplant: Secondary | ICD-10-CM | POA: Diagnosis not present

## 2021-11-21 DIAGNOSIS — I120 Hypertensive chronic kidney disease with stage 5 chronic kidney disease or end stage renal disease: Secondary | ICD-10-CM | POA: Diagnosis not present

## 2021-11-21 DIAGNOSIS — Z7901 Long term (current) use of anticoagulants: Secondary | ICD-10-CM | POA: Diagnosis not present

## 2021-11-21 DIAGNOSIS — M109 Gout, unspecified: Secondary | ICD-10-CM | POA: Diagnosis not present

## 2021-11-21 DIAGNOSIS — Z940 Kidney transplant status: Secondary | ICD-10-CM | POA: Diagnosis not present

## 2021-11-21 DIAGNOSIS — M3214 Glomerular disease in systemic lupus erythematosus: Secondary | ICD-10-CM | POA: Diagnosis not present

## 2021-11-21 DIAGNOSIS — E872 Acidosis, unspecified: Secondary | ICD-10-CM | POA: Diagnosis not present

## 2021-11-21 DIAGNOSIS — I480 Paroxysmal atrial fibrillation: Secondary | ICD-10-CM | POA: Diagnosis not present

## 2021-11-21 DIAGNOSIS — I10 Essential (primary) hypertension: Secondary | ICD-10-CM | POA: Diagnosis not present

## 2021-11-21 DIAGNOSIS — I5022 Chronic systolic (congestive) heart failure: Secondary | ICD-10-CM | POA: Diagnosis not present

## 2021-11-21 DIAGNOSIS — D631 Anemia in chronic kidney disease: Secondary | ICD-10-CM | POA: Diagnosis not present

## 2021-11-22 DIAGNOSIS — Z79621 Long term (current) use of calcineurin inhibitor: Secondary | ICD-10-CM | POA: Diagnosis not present

## 2021-11-22 DIAGNOSIS — E872 Acidosis, unspecified: Secondary | ICD-10-CM | POA: Diagnosis not present

## 2021-11-22 DIAGNOSIS — Z7952 Long term (current) use of systemic steroids: Secondary | ICD-10-CM | POA: Diagnosis not present

## 2021-11-22 DIAGNOSIS — Z79899 Other long term (current) drug therapy: Secondary | ICD-10-CM | POA: Diagnosis not present

## 2021-11-22 DIAGNOSIS — I10 Essential (primary) hypertension: Secondary | ICD-10-CM | POA: Diagnosis not present

## 2021-11-22 DIAGNOSIS — Z940 Kidney transplant status: Secondary | ICD-10-CM | POA: Diagnosis not present

## 2021-11-22 DIAGNOSIS — D84821 Immunodeficiency due to drugs: Secondary | ICD-10-CM | POA: Diagnosis not present

## 2021-11-23 DIAGNOSIS — E872 Acidosis, unspecified: Secondary | ICD-10-CM | POA: Diagnosis not present

## 2021-11-23 DIAGNOSIS — Z79621 Long term (current) use of calcineurin inhibitor: Secondary | ICD-10-CM | POA: Diagnosis not present

## 2021-11-23 DIAGNOSIS — Z79899 Other long term (current) drug therapy: Secondary | ICD-10-CM | POA: Diagnosis not present

## 2021-11-23 DIAGNOSIS — D84821 Immunodeficiency due to drugs: Secondary | ICD-10-CM | POA: Diagnosis not present

## 2021-11-23 DIAGNOSIS — Z7952 Long term (current) use of systemic steroids: Secondary | ICD-10-CM | POA: Diagnosis not present

## 2021-11-23 DIAGNOSIS — Z940 Kidney transplant status: Secondary | ICD-10-CM | POA: Diagnosis not present

## 2021-11-23 DIAGNOSIS — I10 Essential (primary) hypertension: Secondary | ICD-10-CM | POA: Diagnosis not present

## 2021-11-27 DIAGNOSIS — M329 Systemic lupus erythematosus, unspecified: Secondary | ICD-10-CM | POA: Diagnosis not present

## 2021-11-27 DIAGNOSIS — I2510 Atherosclerotic heart disease of native coronary artery without angina pectoris: Secondary | ICD-10-CM | POA: Diagnosis not present

## 2021-11-27 DIAGNOSIS — D631 Anemia in chronic kidney disease: Secondary | ICD-10-CM | POA: Diagnosis not present

## 2021-11-27 DIAGNOSIS — Z7901 Long term (current) use of anticoagulants: Secondary | ICD-10-CM | POA: Diagnosis not present

## 2021-11-27 DIAGNOSIS — I4891 Unspecified atrial fibrillation: Secondary | ICD-10-CM | POA: Diagnosis not present

## 2021-11-27 DIAGNOSIS — D649 Anemia, unspecified: Secondary | ICD-10-CM | POA: Diagnosis not present

## 2021-11-27 DIAGNOSIS — Z4822 Encounter for aftercare following kidney transplant: Secondary | ICD-10-CM | POA: Diagnosis not present

## 2021-11-27 DIAGNOSIS — D849 Immunodeficiency, unspecified: Secondary | ICD-10-CM | POA: Diagnosis not present

## 2021-11-27 DIAGNOSIS — Z940 Kidney transplant status: Secondary | ICD-10-CM | POA: Diagnosis not present

## 2021-11-27 DIAGNOSIS — I10 Essential (primary) hypertension: Secondary | ICD-10-CM | POA: Diagnosis not present

## 2021-11-27 DIAGNOSIS — N185 Chronic kidney disease, stage 5: Secondary | ICD-10-CM | POA: Diagnosis not present

## 2021-12-01 DIAGNOSIS — E785 Hyperlipidemia, unspecified: Secondary | ICD-10-CM | POA: Diagnosis not present

## 2021-12-01 DIAGNOSIS — I4891 Unspecified atrial fibrillation: Secondary | ICD-10-CM | POA: Diagnosis not present

## 2021-12-01 DIAGNOSIS — I6529 Occlusion and stenosis of unspecified carotid artery: Secondary | ICD-10-CM | POA: Diagnosis not present

## 2021-12-01 DIAGNOSIS — I151 Hypertension secondary to other renal disorders: Secondary | ICD-10-CM | POA: Diagnosis not present

## 2021-12-01 DIAGNOSIS — Z79899 Other long term (current) drug therapy: Secondary | ICD-10-CM | POA: Diagnosis not present

## 2021-12-01 DIAGNOSIS — M109 Gout, unspecified: Secondary | ICD-10-CM | POA: Diagnosis not present

## 2021-12-01 DIAGNOSIS — Z5181 Encounter for therapeutic drug level monitoring: Secondary | ICD-10-CM | POA: Diagnosis not present

## 2021-12-01 DIAGNOSIS — M329 Systemic lupus erythematosus, unspecified: Secondary | ICD-10-CM | POA: Diagnosis not present

## 2021-12-01 DIAGNOSIS — I2510 Atherosclerotic heart disease of native coronary artery without angina pectoris: Secondary | ICD-10-CM | POA: Diagnosis not present

## 2021-12-01 DIAGNOSIS — Z940 Kidney transplant status: Secondary | ICD-10-CM | POA: Diagnosis not present

## 2021-12-01 DIAGNOSIS — Z79621 Long term (current) use of calcineurin inhibitor: Secondary | ICD-10-CM | POA: Diagnosis not present

## 2021-12-01 DIAGNOSIS — I252 Old myocardial infarction: Secondary | ICD-10-CM | POA: Diagnosis not present

## 2021-12-01 DIAGNOSIS — D649 Anemia, unspecified: Secondary | ICD-10-CM | POA: Diagnosis not present

## 2021-12-01 DIAGNOSIS — Z4822 Encounter for aftercare following kidney transplant: Secondary | ICD-10-CM | POA: Diagnosis not present

## 2021-12-01 DIAGNOSIS — R1031 Right lower quadrant pain: Secondary | ICD-10-CM | POA: Diagnosis not present

## 2021-12-04 DIAGNOSIS — M329 Systemic lupus erythematosus, unspecified: Secondary | ICD-10-CM | POA: Diagnosis not present

## 2021-12-04 DIAGNOSIS — D849 Immunodeficiency, unspecified: Secondary | ICD-10-CM | POA: Diagnosis not present

## 2021-12-04 DIAGNOSIS — I2510 Atherosclerotic heart disease of native coronary artery without angina pectoris: Secondary | ICD-10-CM | POA: Diagnosis not present

## 2021-12-04 DIAGNOSIS — I252 Old myocardial infarction: Secondary | ICD-10-CM | POA: Diagnosis not present

## 2021-12-04 DIAGNOSIS — Z79899 Other long term (current) drug therapy: Secondary | ICD-10-CM | POA: Diagnosis not present

## 2021-12-04 DIAGNOSIS — Z4822 Encounter for aftercare following kidney transplant: Secondary | ICD-10-CM | POA: Diagnosis not present

## 2021-12-04 DIAGNOSIS — Z940 Kidney transplant status: Secondary | ICD-10-CM | POA: Diagnosis not present

## 2021-12-04 DIAGNOSIS — I110 Hypertensive heart disease with heart failure: Secondary | ICD-10-CM | POA: Diagnosis not present

## 2021-12-04 DIAGNOSIS — I359 Nonrheumatic aortic valve disorder, unspecified: Secondary | ICD-10-CM | POA: Diagnosis not present

## 2021-12-04 DIAGNOSIS — M109 Gout, unspecified: Secondary | ICD-10-CM | POA: Diagnosis not present

## 2021-12-04 DIAGNOSIS — I509 Heart failure, unspecified: Secondary | ICD-10-CM | POA: Diagnosis not present

## 2021-12-04 DIAGNOSIS — I480 Paroxysmal atrial fibrillation: Secondary | ICD-10-CM | POA: Diagnosis not present

## 2021-12-04 DIAGNOSIS — Z7901 Long term (current) use of anticoagulants: Secondary | ICD-10-CM | POA: Diagnosis not present

## 2021-12-04 DIAGNOSIS — D649 Anemia, unspecified: Secondary | ICD-10-CM | POA: Diagnosis not present

## 2021-12-04 DIAGNOSIS — I10 Essential (primary) hypertension: Secondary | ICD-10-CM | POA: Diagnosis not present

## 2021-12-08 DIAGNOSIS — M329 Systemic lupus erythematosus, unspecified: Secondary | ICD-10-CM | POA: Diagnosis not present

## 2021-12-08 DIAGNOSIS — I2510 Atherosclerotic heart disease of native coronary artery without angina pectoris: Secondary | ICD-10-CM | POA: Diagnosis not present

## 2021-12-08 DIAGNOSIS — I10 Essential (primary) hypertension: Secondary | ICD-10-CM | POA: Diagnosis not present

## 2021-12-08 DIAGNOSIS — D649 Anemia, unspecified: Secondary | ICD-10-CM | POA: Diagnosis not present

## 2021-12-08 DIAGNOSIS — Z79620 Long term (current) use of immunosuppressive biologic: Secondary | ICD-10-CM | POA: Diagnosis not present

## 2021-12-08 DIAGNOSIS — D849 Immunodeficiency, unspecified: Secondary | ICD-10-CM | POA: Diagnosis not present

## 2021-12-08 DIAGNOSIS — Z940 Kidney transplant status: Secondary | ICD-10-CM | POA: Diagnosis not present

## 2021-12-08 DIAGNOSIS — Z4822 Encounter for aftercare following kidney transplant: Secondary | ICD-10-CM | POA: Diagnosis not present

## 2021-12-08 DIAGNOSIS — I252 Old myocardial infarction: Secondary | ICD-10-CM | POA: Diagnosis not present

## 2021-12-08 DIAGNOSIS — Z79899 Other long term (current) drug therapy: Secondary | ICD-10-CM | POA: Diagnosis not present

## 2021-12-08 DIAGNOSIS — Z7901 Long term (current) use of anticoagulants: Secondary | ICD-10-CM | POA: Diagnosis not present

## 2021-12-08 DIAGNOSIS — I6529 Occlusion and stenosis of unspecified carotid artery: Secondary | ICD-10-CM | POA: Diagnosis not present

## 2021-12-11 DIAGNOSIS — Z4822 Encounter for aftercare following kidney transplant: Secondary | ICD-10-CM | POA: Diagnosis not present

## 2021-12-11 DIAGNOSIS — Z792 Long term (current) use of antibiotics: Secondary | ICD-10-CM | POA: Diagnosis not present

## 2021-12-11 DIAGNOSIS — I10 Essential (primary) hypertension: Secondary | ICD-10-CM | POA: Diagnosis not present

## 2021-12-11 DIAGNOSIS — I358 Other nonrheumatic aortic valve disorders: Secondary | ICD-10-CM | POA: Diagnosis not present

## 2021-12-11 DIAGNOSIS — Z940 Kidney transplant status: Secondary | ICD-10-CM | POA: Diagnosis not present

## 2021-12-11 DIAGNOSIS — D849 Immunodeficiency, unspecified: Secondary | ICD-10-CM | POA: Diagnosis not present

## 2021-12-11 DIAGNOSIS — Z79621 Long term (current) use of calcineurin inhibitor: Secondary | ICD-10-CM | POA: Diagnosis not present

## 2021-12-11 DIAGNOSIS — D649 Anemia, unspecified: Secondary | ICD-10-CM | POA: Diagnosis not present

## 2021-12-11 DIAGNOSIS — Z7952 Long term (current) use of systemic steroids: Secondary | ICD-10-CM | POA: Diagnosis not present

## 2021-12-11 DIAGNOSIS — Z7901 Long term (current) use of anticoagulants: Secondary | ICD-10-CM | POA: Diagnosis not present

## 2021-12-11 DIAGNOSIS — Z79624 Long term (current) use of inhibitors of nucleotide synthesis: Secondary | ICD-10-CM | POA: Diagnosis not present

## 2021-12-15 DIAGNOSIS — Z940 Kidney transplant status: Secondary | ICD-10-CM | POA: Diagnosis not present

## 2021-12-15 DIAGNOSIS — Z7969 Long term (current) use of other immunomodulators and immunosuppressants: Secondary | ICD-10-CM | POA: Diagnosis not present

## 2021-12-15 DIAGNOSIS — D849 Immunodeficiency, unspecified: Secondary | ICD-10-CM | POA: Diagnosis not present

## 2021-12-15 DIAGNOSIS — Z7952 Long term (current) use of systemic steroids: Secondary | ICD-10-CM | POA: Diagnosis not present

## 2021-12-15 DIAGNOSIS — I4891 Unspecified atrial fibrillation: Secondary | ICD-10-CM | POA: Diagnosis not present

## 2021-12-15 DIAGNOSIS — Z5181 Encounter for therapeutic drug level monitoring: Secondary | ICD-10-CM | POA: Diagnosis not present

## 2021-12-15 DIAGNOSIS — Z7901 Long term (current) use of anticoagulants: Secondary | ICD-10-CM | POA: Diagnosis not present

## 2021-12-15 DIAGNOSIS — Z79621 Long term (current) use of calcineurin inhibitor: Secondary | ICD-10-CM | POA: Diagnosis not present

## 2021-12-15 DIAGNOSIS — Z792 Long term (current) use of antibiotics: Secondary | ICD-10-CM | POA: Diagnosis not present

## 2021-12-15 DIAGNOSIS — I6529 Occlusion and stenosis of unspecified carotid artery: Secondary | ICD-10-CM | POA: Diagnosis not present

## 2021-12-15 DIAGNOSIS — I480 Paroxysmal atrial fibrillation: Secondary | ICD-10-CM | POA: Diagnosis not present

## 2021-12-15 DIAGNOSIS — Z4822 Encounter for aftercare following kidney transplant: Secondary | ICD-10-CM | POA: Diagnosis not present

## 2021-12-15 DIAGNOSIS — G479 Sleep disorder, unspecified: Secondary | ICD-10-CM | POA: Diagnosis not present

## 2021-12-15 DIAGNOSIS — I10 Essential (primary) hypertension: Secondary | ICD-10-CM | POA: Diagnosis not present

## 2021-12-15 DIAGNOSIS — I252 Old myocardial infarction: Secondary | ICD-10-CM | POA: Diagnosis not present

## 2021-12-18 DIAGNOSIS — I480 Paroxysmal atrial fibrillation: Secondary | ICD-10-CM | POA: Diagnosis not present

## 2021-12-18 DIAGNOSIS — Z466 Encounter for fitting and adjustment of urinary device: Secondary | ICD-10-CM | POA: Diagnosis not present

## 2021-12-18 DIAGNOSIS — Z7901 Long term (current) use of anticoagulants: Secondary | ICD-10-CM | POA: Diagnosis not present

## 2021-12-18 DIAGNOSIS — Z79621 Long term (current) use of calcineurin inhibitor: Secondary | ICD-10-CM | POA: Diagnosis not present

## 2021-12-18 DIAGNOSIS — D649 Anemia, unspecified: Secondary | ICD-10-CM | POA: Diagnosis not present

## 2021-12-18 DIAGNOSIS — Z5181 Encounter for therapeutic drug level monitoring: Secondary | ICD-10-CM | POA: Diagnosis not present

## 2021-12-18 DIAGNOSIS — D849 Immunodeficiency, unspecified: Secondary | ICD-10-CM | POA: Diagnosis not present

## 2021-12-18 DIAGNOSIS — Z9889 Other specified postprocedural states: Secondary | ICD-10-CM | POA: Diagnosis not present

## 2021-12-18 DIAGNOSIS — I2510 Atherosclerotic heart disease of native coronary artery without angina pectoris: Secondary | ICD-10-CM | POA: Diagnosis not present

## 2021-12-18 DIAGNOSIS — M329 Systemic lupus erythematosus, unspecified: Secondary | ICD-10-CM | POA: Diagnosis not present

## 2021-12-18 DIAGNOSIS — M109 Gout, unspecified: Secondary | ICD-10-CM | POA: Diagnosis not present

## 2021-12-18 DIAGNOSIS — I10 Essential (primary) hypertension: Secondary | ICD-10-CM | POA: Diagnosis not present

## 2021-12-18 DIAGNOSIS — Z4822 Encounter for aftercare following kidney transplant: Secondary | ICD-10-CM | POA: Diagnosis not present

## 2021-12-18 DIAGNOSIS — I252 Old myocardial infarction: Secondary | ICD-10-CM | POA: Diagnosis not present

## 2021-12-18 DIAGNOSIS — Z940 Kidney transplant status: Secondary | ICD-10-CM | POA: Diagnosis not present

## 2021-12-22 DIAGNOSIS — Z4822 Encounter for aftercare following kidney transplant: Secondary | ICD-10-CM | POA: Diagnosis not present

## 2021-12-22 DIAGNOSIS — I359 Nonrheumatic aortic valve disorder, unspecified: Secondary | ICD-10-CM | POA: Diagnosis not present

## 2021-12-22 DIAGNOSIS — I509 Heart failure, unspecified: Secondary | ICD-10-CM | POA: Diagnosis not present

## 2021-12-22 DIAGNOSIS — Z5181 Encounter for therapeutic drug level monitoring: Secondary | ICD-10-CM | POA: Diagnosis not present

## 2021-12-22 DIAGNOSIS — I10 Essential (primary) hypertension: Secondary | ICD-10-CM | POA: Diagnosis not present

## 2021-12-22 DIAGNOSIS — Z79899 Other long term (current) drug therapy: Secondary | ICD-10-CM | POA: Diagnosis not present

## 2021-12-22 DIAGNOSIS — D649 Anemia, unspecified: Secondary | ICD-10-CM | POA: Diagnosis not present

## 2021-12-22 DIAGNOSIS — I6529 Occlusion and stenosis of unspecified carotid artery: Secondary | ICD-10-CM | POA: Diagnosis not present

## 2021-12-22 DIAGNOSIS — Z79621 Long term (current) use of calcineurin inhibitor: Secondary | ICD-10-CM | POA: Diagnosis not present

## 2021-12-22 DIAGNOSIS — I480 Paroxysmal atrial fibrillation: Secondary | ICD-10-CM | POA: Diagnosis not present

## 2021-12-22 DIAGNOSIS — I110 Hypertensive heart disease with heart failure: Secondary | ICD-10-CM | POA: Diagnosis not present

## 2021-12-22 DIAGNOSIS — I2510 Atherosclerotic heart disease of native coronary artery without angina pectoris: Secondary | ICD-10-CM | POA: Diagnosis not present

## 2021-12-22 DIAGNOSIS — Z940 Kidney transplant status: Secondary | ICD-10-CM | POA: Diagnosis not present

## 2021-12-22 DIAGNOSIS — I252 Old myocardial infarction: Secondary | ICD-10-CM | POA: Diagnosis not present

## 2021-12-22 DIAGNOSIS — D849 Immunodeficiency, unspecified: Secondary | ICD-10-CM | POA: Diagnosis not present

## 2021-12-31 DIAGNOSIS — D649 Anemia, unspecified: Secondary | ICD-10-CM | POA: Diagnosis not present

## 2021-12-31 DIAGNOSIS — Z4822 Encounter for aftercare following kidney transplant: Secondary | ICD-10-CM | POA: Diagnosis not present

## 2021-12-31 DIAGNOSIS — I509 Heart failure, unspecified: Secondary | ICD-10-CM | POA: Diagnosis not present

## 2021-12-31 DIAGNOSIS — I252 Old myocardial infarction: Secondary | ICD-10-CM | POA: Diagnosis not present

## 2021-12-31 DIAGNOSIS — I2510 Atherosclerotic heart disease of native coronary artery without angina pectoris: Secondary | ICD-10-CM | POA: Diagnosis not present

## 2021-12-31 DIAGNOSIS — M109 Gout, unspecified: Secondary | ICD-10-CM | POA: Diagnosis not present

## 2021-12-31 DIAGNOSIS — Z79899 Other long term (current) drug therapy: Secondary | ICD-10-CM | POA: Diagnosis not present

## 2021-12-31 DIAGNOSIS — M329 Systemic lupus erythematosus, unspecified: Secondary | ICD-10-CM | POA: Diagnosis not present

## 2021-12-31 DIAGNOSIS — I110 Hypertensive heart disease with heart failure: Secondary | ICD-10-CM | POA: Diagnosis not present

## 2022-01-07 DIAGNOSIS — Z792 Long term (current) use of antibiotics: Secondary | ICD-10-CM | POA: Diagnosis not present

## 2022-01-07 DIAGNOSIS — Z7952 Long term (current) use of systemic steroids: Secondary | ICD-10-CM | POA: Diagnosis not present

## 2022-01-07 DIAGNOSIS — Z7901 Long term (current) use of anticoagulants: Secondary | ICD-10-CM | POA: Diagnosis not present

## 2022-01-07 DIAGNOSIS — D849 Immunodeficiency, unspecified: Secondary | ICD-10-CM | POA: Diagnosis not present

## 2022-01-07 DIAGNOSIS — Z79899 Other long term (current) drug therapy: Secondary | ICD-10-CM | POA: Diagnosis not present

## 2022-01-07 DIAGNOSIS — Z79624 Long term (current) use of inhibitors of nucleotide synthesis: Secondary | ICD-10-CM | POA: Diagnosis not present

## 2022-01-07 DIAGNOSIS — I10 Essential (primary) hypertension: Secondary | ICD-10-CM | POA: Diagnosis not present

## 2022-01-07 DIAGNOSIS — Z940 Kidney transplant status: Secondary | ICD-10-CM

## 2022-01-07 DIAGNOSIS — Z4822 Encounter for aftercare following kidney transplant: Secondary | ICD-10-CM | POA: Diagnosis not present

## 2022-01-07 DIAGNOSIS — Z79621 Long term (current) use of calcineurin inhibitor: Secondary | ICD-10-CM | POA: Diagnosis not present

## 2022-01-07 DIAGNOSIS — Z5181 Encounter for therapeutic drug level monitoring: Secondary | ICD-10-CM | POA: Diagnosis not present

## 2022-01-14 DIAGNOSIS — D649 Anemia, unspecified: Secondary | ICD-10-CM | POA: Diagnosis not present

## 2022-01-14 DIAGNOSIS — Z940 Kidney transplant status: Secondary | ICD-10-CM | POA: Diagnosis not present

## 2022-01-14 DIAGNOSIS — I10 Essential (primary) hypertension: Secondary | ICD-10-CM | POA: Diagnosis not present

## 2022-01-14 DIAGNOSIS — Z4822 Encounter for aftercare following kidney transplant: Secondary | ICD-10-CM | POA: Diagnosis not present

## 2022-01-14 DIAGNOSIS — Z79621 Long term (current) use of calcineurin inhibitor: Secondary | ICD-10-CM | POA: Diagnosis not present

## 2022-01-14 DIAGNOSIS — Z5181 Encounter for therapeutic drug level monitoring: Secondary | ICD-10-CM | POA: Diagnosis not present

## 2022-01-14 DIAGNOSIS — Z792 Long term (current) use of antibiotics: Secondary | ICD-10-CM | POA: Diagnosis not present

## 2022-01-14 DIAGNOSIS — Z79624 Long term (current) use of inhibitors of nucleotide synthesis: Secondary | ICD-10-CM | POA: Diagnosis not present

## 2022-01-14 DIAGNOSIS — Z7952 Long term (current) use of systemic steroids: Secondary | ICD-10-CM | POA: Diagnosis not present

## 2022-01-14 DIAGNOSIS — D849 Immunodeficiency, unspecified: Secondary | ICD-10-CM | POA: Diagnosis not present

## 2022-01-21 DIAGNOSIS — I480 Paroxysmal atrial fibrillation: Secondary | ICD-10-CM | POA: Diagnosis not present

## 2022-01-21 DIAGNOSIS — D849 Immunodeficiency, unspecified: Secondary | ICD-10-CM | POA: Diagnosis not present

## 2022-01-21 DIAGNOSIS — Z7901 Long term (current) use of anticoagulants: Secondary | ICD-10-CM | POA: Diagnosis not present

## 2022-01-21 DIAGNOSIS — I110 Hypertensive heart disease with heart failure: Secondary | ICD-10-CM | POA: Diagnosis not present

## 2022-01-21 DIAGNOSIS — D649 Anemia, unspecified: Secondary | ICD-10-CM | POA: Diagnosis not present

## 2022-01-21 DIAGNOSIS — I10 Essential (primary) hypertension: Secondary | ICD-10-CM | POA: Diagnosis not present

## 2022-01-21 DIAGNOSIS — I509 Heart failure, unspecified: Secondary | ICD-10-CM | POA: Diagnosis not present

## 2022-01-21 DIAGNOSIS — M3214 Glomerular disease in systemic lupus erythematosus: Secondary | ICD-10-CM | POA: Diagnosis not present

## 2022-01-21 DIAGNOSIS — M109 Gout, unspecified: Secondary | ICD-10-CM | POA: Diagnosis not present

## 2022-01-21 DIAGNOSIS — I2510 Atherosclerotic heart disease of native coronary artery without angina pectoris: Secondary | ICD-10-CM | POA: Diagnosis not present

## 2022-01-21 DIAGNOSIS — K219 Gastro-esophageal reflux disease without esophagitis: Secondary | ICD-10-CM | POA: Diagnosis not present

## 2022-01-21 DIAGNOSIS — Z940 Kidney transplant status: Secondary | ICD-10-CM | POA: Diagnosis not present

## 2022-01-21 DIAGNOSIS — Z4822 Encounter for aftercare following kidney transplant: Secondary | ICD-10-CM | POA: Diagnosis not present

## 2022-01-21 DIAGNOSIS — I252 Old myocardial infarction: Secondary | ICD-10-CM | POA: Diagnosis not present

## 2022-02-04 DIAGNOSIS — D849 Immunodeficiency, unspecified: Secondary | ICD-10-CM | POA: Diagnosis not present

## 2022-02-04 DIAGNOSIS — Z79621 Long term (current) use of calcineurin inhibitor: Secondary | ICD-10-CM | POA: Diagnosis not present

## 2022-02-04 DIAGNOSIS — G479 Sleep disorder, unspecified: Secondary | ICD-10-CM | POA: Diagnosis not present

## 2022-02-04 DIAGNOSIS — Z5181 Encounter for therapeutic drug level monitoring: Secondary | ICD-10-CM | POA: Diagnosis not present

## 2022-02-04 DIAGNOSIS — M10072 Idiopathic gout, left ankle and foot: Secondary | ICD-10-CM | POA: Diagnosis not present

## 2022-02-04 DIAGNOSIS — I359 Nonrheumatic aortic valve disorder, unspecified: Secondary | ICD-10-CM | POA: Diagnosis not present

## 2022-02-04 DIAGNOSIS — D649 Anemia, unspecified: Secondary | ICD-10-CM | POA: Diagnosis not present

## 2022-02-04 DIAGNOSIS — I4891 Unspecified atrial fibrillation: Secondary | ICD-10-CM | POA: Diagnosis not present

## 2022-02-04 DIAGNOSIS — Z4822 Encounter for aftercare following kidney transplant: Secondary | ICD-10-CM | POA: Diagnosis not present

## 2022-02-04 DIAGNOSIS — I2510 Atherosclerotic heart disease of native coronary artery without angina pectoris: Secondary | ICD-10-CM | POA: Diagnosis not present

## 2022-02-04 DIAGNOSIS — M329 Systemic lupus erythematosus, unspecified: Secondary | ICD-10-CM | POA: Diagnosis not present

## 2022-02-04 DIAGNOSIS — I10 Essential (primary) hypertension: Secondary | ICD-10-CM | POA: Diagnosis not present

## 2022-02-04 DIAGNOSIS — I5020 Unspecified systolic (congestive) heart failure: Secondary | ICD-10-CM | POA: Diagnosis not present

## 2022-02-04 DIAGNOSIS — I6529 Occlusion and stenosis of unspecified carotid artery: Secondary | ICD-10-CM | POA: Diagnosis not present

## 2022-02-18 DIAGNOSIS — Z7952 Long term (current) use of systemic steroids: Secondary | ICD-10-CM | POA: Diagnosis not present

## 2022-02-18 DIAGNOSIS — Z7901 Long term (current) use of anticoagulants: Secondary | ICD-10-CM | POA: Diagnosis not present

## 2022-02-18 DIAGNOSIS — Z4822 Encounter for aftercare following kidney transplant: Secondary | ICD-10-CM | POA: Diagnosis not present

## 2022-02-18 DIAGNOSIS — Z79621 Long term (current) use of calcineurin inhibitor: Secondary | ICD-10-CM | POA: Diagnosis not present

## 2022-02-18 DIAGNOSIS — Z79624 Long term (current) use of inhibitors of nucleotide synthesis: Secondary | ICD-10-CM | POA: Diagnosis not present

## 2022-02-18 DIAGNOSIS — Z79899 Other long term (current) drug therapy: Secondary | ICD-10-CM | POA: Diagnosis not present

## 2022-02-18 DIAGNOSIS — M10062 Idiopathic gout, left knee: Secondary | ICD-10-CM | POA: Diagnosis not present

## 2022-02-18 DIAGNOSIS — Z5181 Encounter for therapeutic drug level monitoring: Secondary | ICD-10-CM | POA: Diagnosis not present

## 2022-02-18 DIAGNOSIS — R1013 Epigastric pain: Secondary | ICD-10-CM | POA: Diagnosis not present

## 2022-02-18 DIAGNOSIS — I480 Paroxysmal atrial fibrillation: Secondary | ICD-10-CM | POA: Diagnosis not present

## 2022-02-18 DIAGNOSIS — Z940 Kidney transplant status: Secondary | ICD-10-CM | POA: Diagnosis not present

## 2022-02-18 DIAGNOSIS — K219 Gastro-esophageal reflux disease without esophagitis: Secondary | ICD-10-CM | POA: Diagnosis not present

## 2022-02-18 DIAGNOSIS — D849 Immunodeficiency, unspecified: Secondary | ICD-10-CM | POA: Diagnosis not present

## 2022-02-18 DIAGNOSIS — I10 Essential (primary) hypertension: Secondary | ICD-10-CM | POA: Diagnosis not present

## 2022-02-18 DIAGNOSIS — Z9181 History of falling: Secondary | ICD-10-CM | POA: Diagnosis not present

## 2022-02-18 DIAGNOSIS — Z792 Long term (current) use of antibiotics: Secondary | ICD-10-CM | POA: Diagnosis not present

## 2022-03-23 ENCOUNTER — Encounter: Payer: Self-pay | Admitting: Cardiology

## 2022-03-23 ENCOUNTER — Ambulatory Visit: Payer: Medicare HMO | Attending: Cardiology | Admitting: Cardiology

## 2022-03-23 VITALS — BP 152/82 | HR 83 | Ht 65.0 in | Wt 197.6 lb

## 2022-03-23 DIAGNOSIS — I2510 Atherosclerotic heart disease of native coronary artery without angina pectoris: Secondary | ICD-10-CM | POA: Diagnosis not present

## 2022-03-23 DIAGNOSIS — N185 Chronic kidney disease, stage 5: Secondary | ICD-10-CM | POA: Diagnosis not present

## 2022-03-23 DIAGNOSIS — I471 Supraventricular tachycardia, unspecified: Secondary | ICD-10-CM

## 2022-03-23 DIAGNOSIS — I10 Essential (primary) hypertension: Secondary | ICD-10-CM | POA: Diagnosis not present

## 2022-03-23 NOTE — Patient Instructions (Signed)
Medication Instructions:  The current medical regimen is effective;  continue present plan and medications.  *If you need a refill on your cardiac medications before your next appointment, please call your pharmacy*  Follow-Up: At [facility], you and your health needs are our priority.  As part of our continuing mission to provide you with exceptional heart care, we have created designated Provider Care Teams.  These Care Teams include your primary Cardiologist (physician) and Advanced Practice Providers (APPs -  Physician Assistants and Nurse Practitioners) who all work together to provide you with the care you need, when you need it.  We recommend signing up for the patient portal called "MyChart".  Sign up information is provided on this After Visit Summary.  MyChart is used to connect with patients for Virtual Visits (Telemedicine).  Patients are able to view lab/test results, encounter notes, upcoming appointments, etc.  Non-urgent messages can be sent to your provider as well.   To learn more about what you can do with MyChart, go to [email or web address].    Your next appointment:   1 year(s)  The format for your next appointment:   In Person  Provider:   Mark Skains, MD      Important Information About Sugar       

## 2022-03-23 NOTE — Progress Notes (Signed)
Cardiology Office Note   Date:  03/23/2022   ID:  Brittany Charles, DOB 1950-08-25, MRN [id number]  PCP:  Wendie Agreste, MD  Cardiologist:  Candee Furbish, MD [facility] Electrophysiologist:  None   Evaluation Performed:  New Patient Evaluation  Chief Complaint:  CAD / SVT   History of Present Illness:    Brittany Charles is a 71 y.o. female with coronary artery disease with cath in 2012 showing mild to moderate LAD stenosis with no significant RCA or circumflex stenosis.  Last seen in our office in 2016.  Previously in the ER in 2015 with rapid heart rate question of atrial flutter with variable block.  Likely SVT.  Converted to sinus rhythm with IV Cardizem at that time.  Given a prescription for Cardizem CD 120 mg.  EF 45 to 50%.  Stable.  She underwent a renal transplant on 8/84/1660 without complication. She was discharged in stable condition.  No further issues with SVT.  Prior office visit was 09/17/2014.  Sometimes feels fatigued.   Dr Florene Glen transitioned her care to another partner- watching.   Not having any anginal symptoms.  No early family history of CAD.  Today, she says she has been feeling a little tired lately. She has come off of the vitamins she had been taking due to her recent renal transplant and wonders if that could be related to her fatigue.  She will be seeing her transplant doctors on Thursday, November 16 for a routine follow up.   She denies any palpitations, chest pain, shortness of breath, or peripheral edema. No lightheadedness, headaches, syncope, orthopnea, or PND.  Past Medical History:  Diagnosis Date   Anemia    Arthritis    CHF (congestive heart failure) ([place])    Colon polyps    COVID 2020   hospitalized at [facility]    GERD (gastroesophageal reflux disease)    Hypertension    Lupus (systemic lupus erythematosus) ([place])    Myocardial infarction ([place])    Pneumonia    Renal insufficiency    CKD stage 5- not on dialysis  (07/08/21)   Past Surgical History:  Procedure Laterality Date   ABDOMINAL HYSTERECTOMY  10/27/2000   TAH, BSO   AV FISTULA PLACEMENT Left 05/27/2021   Procedure: LEFT BRACHIOCEPHALIC ARTERIOVENOUS (AV) FISTULA CREATION;  Surgeon: Broadus John, MD;  Location: [facility];  Service: Vascular;  Laterality: Left;  with peripheral nerve block   BIOPSY  09/02/2021   Procedure: BIOPSY;  Surgeon: Mauri Pole, MD;  Location: [facility];  Service: Endoscopy;;   COLONOSCOPY WITH PROPOFOL N/A 09/02/2021   Procedure: COLONOSCOPY WITH PROPOFOL;  Surgeon: Mauri Pole, MD;  Location: [facility];  Service: Endoscopy;  Laterality: N/A;   LEFT AND RIGHT HEART CATHETERIZATION WITH CORONARY ANGIOGRAM Right 04/21/2011   Procedure: LEFT AND RIGHT HEART CATHETERIZATION WITH CORONARY ANGIOGRAM;  Surgeon: [name], MD;  Location: [facility] C[facility];  Service: Cardiovascular;  Laterality: Right;   LIGATION OF ARTERIOVENOUS  FISTULA Left 07/10/2021   Procedure: LEFT ARM BRANCH LIGATION OF ARTERIOVENOUS  FISTULA;  Surgeon: Broadus John, MD;  Location: [place];  Service: Vascular;  Laterality: Left;  PERIPHERAL NERVE BLOCK   POLYPECTOMY  09/02/2021   Procedure: POLYPECTOMY;  Surgeon: Mauri Pole, MD;  Location: [facility];  Service: Endoscopy;;   TONSILLECTOMY       Current Meds  Medication Sig   acetaminophen (TYLENOL) 325 MG tablet Take 650 mg by mouth  every 6 (six) hours as needed for moderate pain.   albuterol (VENTOLIN HFA) 108 (90 Base) MCG/ACT inhaler Inhale 2 puffs into the lungs every 6 (six) hours as needed for wheezing or shortness of breath.   allopurinol (ZYLOPRIM) 100 MG tablet TAKE 1 TABLET EVERY DAY   DILT-XR 180 MG 24 hr capsule Take 180 mg by mouth daily.   doxazosin (CARDURA) 2 MG tablet Take 2 mg by mouth 2 (two) times daily.   ELIQUIS 5 MG TABS tablet TAKE 1 TABLET(5 MG) BY MOUTH TWICE DAILY   furosemide (LASIX) 80 MG tablet Take 80 mg by mouth daily as needed (swelling).    hydrALAZINE (APRESOLINE) 50 MG tablet TAKE 1 TABLET THREE TIMES DAILY   Menthol, Topical Analgesic, (BIOFREEZE) 4 % GEL Apply 1 application. topically 3 (three) times daily as needed (pain).   metoprolol succinate (TOPROL-XL) 25 MG 24 hr tablet Take 1 tablet (25 mg total) by mouth daily. Take with or immediately following a meal.   mycophenolate (MYFORTIC) 180 MG EC tablet Take 360 mg by mouth 2 (two) times daily.   pantoprazole (PROTONIX) 40 MG tablet Take 40 mg by mouth daily.   prednisoLONE 5 MG TABS tablet Take by mouth.   rosuvastatin (CRESTOR) 20 MG tablet Take 1 tablet (20 mg total) by mouth daily.   sodium bicarbonate 650 MG tablet Take 1,300 mg by mouth 2 (two) times daily.   sulfamethoxazole-trimethoprim (BACTRIM) 400-80 MG tablet Take 1 tablet by mouth 3 (three) times a week.   tacrolimus (PROGRAF) 1 MG capsule Take 3 mg by mouth in the AM, take 2 mg by mouth in the PM   valGANciclovir (VALCYTE) 450 MG tablet Take 450 mg by mouth daily.   zinc sulfate 220 (50 Zn) MG capsule Take 1 capsule (220 mg total) by mouth daily.     Allergies:   Zithromax [azithromycin]   Social History   Tobacco Use   Smoking status: Never   Smokeless tobacco: Never  Vaping Use   Vaping Use: Never used  Substance Use Topics   Alcohol use: No   Drug use: No     Family Hx: The patient's family history includes Asthma in her brother; Breast cancer in an other family member; Colon cancer in her son and another family member; Diabetes in her mother; Heart disease in her mother; Hyperlipidemia in her father; Hypertension in her father, mother, and son; Kidney disease in her brother; Stroke in her father, maternal grandmother, and mother.  ROS:   Please see the history of present illness.  (+) Fatigue  All other systems reviewed and are negative.   Prior CV studies:   The following studies were reviewed today:  Long Term Monitor 01/26/2020: Sinus rhythm with average heart rate of 77 bpm Occasional  premature atrial contractions, 1.5% Rare premature ventricular contractions, less than 1% There were 2 episodes of ventricular tachycardia with 1 run lasting 21 seconds with an average heart rate of 131 bpm, slow VT. No symptoms reported. Another episode lasted 4 beats with a maximum rate of 164 bpm. Occasional episodes of SVT, paroxysmal atrial tachycardia were noted with the longest episode lasting 13 beats duration. No evidence of atrial fibrillation.   Slow ventricular tachycardia noted.  Ejection fraction normal on echocardiogram.  On diltiazem CD 180 mg once a day. Recommendation would be to change diltiazem to Toprol-XL 100 mg a day to see if this helps suppress both VT as well as SVT. No high risk features such as  syncope.   Echo 01/03/2020:  1. Left ventricular ejection fraction, by estimation, is 65 to 70%. The  left ventricle has normal function. The left ventricle has no regional  wall motion abnormalities. Left ventricular diastolic parameters were  normal.   2. Right ventricular systolic function is normal. The right ventricular  size is normal. Tricuspid regurgitation signal is inadequate for assessing  PA pressure.   3. The mitral valve is grossly normal. No evidence of mitral valve  regurgitation. No evidence of mitral stenosis.   4. The aortic valve is tricuspid. Aortic valve regurgitation is not  visualized. Mild aortic valve sclerosis is present, with no evidence of  aortic valve stenosis.   5. The inferior vena cava is normal in size with greater than 50%  respiratory variability, suggesting right atrial pressure of 3 mmHg.      2-D echo 10/02/12 Study Conclusions  - Left ventricle: Diffuse hypokinesis worse in the inferior   base The cavity size was mildly dilated. Wall thickness   was increased in a pattern of mild LVH. Systolic function   was mildly reduced. The estimated ejection fraction was in   the range of 45% to 50%. - Left atrium: The atrium was mildly  dilated. - Right atrium: The atrium was mildly dilated. - Tricuspid valve: Mild-moderate regurgitation. - Pulmonary arteries: PA peak pressure: 79m Hg (S). Transthoracic echocardiography.   Labs/Other Tests and Data Reviewed:    EKG:  EKG is personally reviewed. 03/23/22: Sinus rhythm. Rate 83 bpm. Sinus arrhythmia. Poor R wave progression.  09/17/14: Sinus rhythm 79 with nonspecific ST-T wave changes.   Recent Labs: 09/02/2021: BUN 48; Creatinine, Ser 4.50; Potassium 3.9; Sodium 141 09/23/2021: Hemoglobin 9.9   Recent Lipid Panel Lab Results  Component Value Date/Time   CHOL 93 03/04/2021 12:13 AM   CHOL 212 (H) 11/21/2018 12:00 PM   TRIG 54 03/04/2021 12:13 AM   HDL 45 03/04/2021 12:13 AM   HDL 42 11/21/2018 12:00 PM   CHOLHDL 2.1 03/04/2021 12:13 AM   LDLCALC 37 03/04/2021 12:13 AM   LDLCALC 132 (H) 11/21/2018 12:00 PM    Wt Readings from Last 3 Encounters:  03/23/22 197 lb 9.6 oz (89.6 kg)  09/02/21 192 lb (87.1 kg)  08/11/21 191 lb (86.6 kg)     Objective:    Vital Signs:  BP (!) 152/82   Pulse 83   Ht '5\' 5"'$  (1.651 m)   Wt 197 lb 9.6 oz (89.6 kg)   SpO2 98%   BMI 32.88 kg/m    GEN: Well nourished, well developed in no acute distress HEENT: Normal, moist mucous membranes NECK: No JVD CARDIAC: regular rhythm, normal S1 and S2, no rubs or gallops. No murmur. VASCULAR: Radial and DP pulses 2+ bilaterally. No carotid bruits RESPIRATORY:  Clear to auscultation without rales, wheezing or rhonchi  ABDOMEN: Soft, non-tender, non-distended MUSCULOSKELETAL:  Ambulates independently SKIN: Warm and dry, no edema NEUROLOGIC:  Alert and oriented x 3. No focal neuro deficits noted. PSYCHIATRIC:  Normal affect     ASSESSMENT & PLAN:    Supraventricular tachycardia/paroxysmal atrial fibrillation -  Taking diltiazem long-acting 180 mg once a day.  Continue.  No changes made.  No complaints of rapid heart rate. -On Eliquis for anticoagulation.  Following lab work or on  aspirin.  Cardiomyopathy - Prior ejection fraction in the 50% range.  Overall she is feeling well, NYHA class I symptoms.  Remains on hydralazine, no ACE inhibitor because of chronic kidney disease stage IV.  I am comfortable with her continuing her diltiazem.  No beta-blocker since she is taking diltiazem.  Seems to be doing well.  Essential hypertension -Also on hydralazine.  Mild LVH noted on prior echocardiogram in 2014.  Continue to monitor, [place] kidney has been watching.  Adjustments per nephrology team.  Renal transplant - Medications reviewed.  Feeling some fatigue.  She has lab work coming up.  She had her fistula ligated on 07/10/2021 by Dr. Virl Cagey of vascular surgery.    Medication Adjustments/Labs and Tests Ordered: Current medicines are reviewed at length with the patient today.  Concerns regarding medicines are outlined above.   Tests Ordered: Orders Placed This Encounter  Procedures   EKG 12-Lead    Medication Changes: No orders of the defined types were placed in this encounter.   Disposition:  Follow up in 1 year.    I,Breanna Adamick,acting as a scribe for [employer], MD.,have documented all relevant documentation on the behalf of Candee Furbish, MD,as directed by  Candee Furbish, MD while in the presence of Candee Furbish, MD.  I, Candee Furbish, MD, have reviewed all documentation for this visit. The documentation on 03/23/22 for the exam, diagnosis, procedures, and orders are all accurate and complete.   Signed, Candee Furbish, MD  03/23/2022 10:00 AM    [facility]

## 2022-03-26 DIAGNOSIS — I10 Essential (primary) hypertension: Secondary | ICD-10-CM | POA: Diagnosis not present

## 2022-03-26 DIAGNOSIS — E872 Acidosis, unspecified: Secondary | ICD-10-CM | POA: Diagnosis not present

## 2022-03-26 DIAGNOSIS — Z4822 Encounter for aftercare following kidney transplant: Secondary | ICD-10-CM | POA: Diagnosis not present

## 2022-03-26 DIAGNOSIS — I2510 Atherosclerotic heart disease of native coronary artery without angina pectoris: Secondary | ICD-10-CM | POA: Diagnosis not present

## 2022-03-26 DIAGNOSIS — Z940 Kidney transplant status: Secondary | ICD-10-CM | POA: Diagnosis not present

## 2022-03-26 DIAGNOSIS — M329 Systemic lupus erythematosus, unspecified: Secondary | ICD-10-CM | POA: Diagnosis not present

## 2022-03-26 DIAGNOSIS — M109 Gout, unspecified: Secondary | ICD-10-CM | POA: Diagnosis not present

## 2022-03-26 DIAGNOSIS — D849 Immunodeficiency, unspecified: Secondary | ICD-10-CM | POA: Diagnosis not present

## 2022-03-26 DIAGNOSIS — D649 Anemia, unspecified: Secondary | ICD-10-CM | POA: Diagnosis not present

## 2022-03-26 DIAGNOSIS — Z7901 Long term (current) use of anticoagulants: Secondary | ICD-10-CM | POA: Diagnosis not present

## 2022-03-26 DIAGNOSIS — R1013 Epigastric pain: Secondary | ICD-10-CM | POA: Diagnosis not present

## 2022-03-26 DIAGNOSIS — E785 Hyperlipidemia, unspecified: Secondary | ICD-10-CM | POA: Diagnosis not present

## 2022-04-09 DIAGNOSIS — Z940 Kidney transplant status: Secondary | ICD-10-CM | POA: Diagnosis not present

## 2022-04-09 DIAGNOSIS — Z5181 Encounter for therapeutic drug level monitoring: Secondary | ICD-10-CM | POA: Diagnosis not present

## 2022-04-09 DIAGNOSIS — I480 Paroxysmal atrial fibrillation: Secondary | ICD-10-CM | POA: Diagnosis not present

## 2022-04-09 DIAGNOSIS — I2510 Atherosclerotic heart disease of native coronary artery without angina pectoris: Secondary | ICD-10-CM | POA: Diagnosis not present

## 2022-04-09 DIAGNOSIS — M329 Systemic lupus erythematosus, unspecified: Secondary | ICD-10-CM | POA: Diagnosis not present

## 2022-04-09 DIAGNOSIS — Z79621 Long term (current) use of calcineurin inhibitor: Secondary | ICD-10-CM | POA: Diagnosis not present

## 2022-04-09 DIAGNOSIS — R5383 Other fatigue: Secondary | ICD-10-CM | POA: Diagnosis not present

## 2022-04-09 DIAGNOSIS — I110 Hypertensive heart disease with heart failure: Secondary | ICD-10-CM | POA: Diagnosis not present

## 2022-04-09 DIAGNOSIS — D849 Immunodeficiency, unspecified: Secondary | ICD-10-CM | POA: Diagnosis not present

## 2022-04-09 DIAGNOSIS — I252 Old myocardial infarction: Secondary | ICD-10-CM | POA: Diagnosis not present

## 2022-04-09 DIAGNOSIS — M109 Gout, unspecified: Secondary | ICD-10-CM | POA: Diagnosis not present

## 2022-04-09 DIAGNOSIS — Z4822 Encounter for aftercare following kidney transplant: Secondary | ICD-10-CM | POA: Diagnosis not present

## 2022-04-09 DIAGNOSIS — I509 Heart failure, unspecified: Secondary | ICD-10-CM | POA: Diagnosis not present

## 2022-04-09 DIAGNOSIS — D649 Anemia, unspecified: Secondary | ICD-10-CM | POA: Diagnosis not present

## 2022-04-21 ENCOUNTER — Other Ambulatory Visit: Payer: Self-pay | Admitting: Cardiology

## 2022-04-21 DIAGNOSIS — I480 Paroxysmal atrial fibrillation: Secondary | ICD-10-CM

## 2022-04-21 NOTE — Telephone Encounter (Signed)
Eliquis '5mg'$  refill request received. Patient is 71 years old, weight-89.6kg, Crea-4.50 on 09/02/2021, Diagnosis-Afib, and last seen by Dr. Marlou Porch on 03/23/2022. Dose is appropriate based on dosing criteria. Will send in refill to requested pharmacy.

## 2022-04-23 DIAGNOSIS — E611 Iron deficiency: Secondary | ICD-10-CM | POA: Diagnosis not present

## 2022-04-23 DIAGNOSIS — R7301 Impaired fasting glucose: Secondary | ICD-10-CM | POA: Diagnosis not present

## 2022-04-23 DIAGNOSIS — R7989 Other specified abnormal findings of blood chemistry: Secondary | ICD-10-CM | POA: Diagnosis not present

## 2022-04-23 DIAGNOSIS — I10 Essential (primary) hypertension: Secondary | ICD-10-CM | POA: Diagnosis not present

## 2022-04-23 DIAGNOSIS — Z4822 Encounter for aftercare following kidney transplant: Secondary | ICD-10-CM | POA: Diagnosis not present

## 2022-05-07 DIAGNOSIS — R739 Hyperglycemia, unspecified: Secondary | ICD-10-CM | POA: Diagnosis not present

## 2022-05-07 DIAGNOSIS — D649 Anemia, unspecified: Secondary | ICD-10-CM | POA: Diagnosis not present

## 2022-05-07 DIAGNOSIS — M329 Systemic lupus erythematosus, unspecified: Secondary | ICD-10-CM | POA: Diagnosis not present

## 2022-05-07 DIAGNOSIS — I2510 Atherosclerotic heart disease of native coronary artery without angina pectoris: Secondary | ICD-10-CM | POA: Diagnosis not present

## 2022-05-07 DIAGNOSIS — Z79621 Long term (current) use of calcineurin inhibitor: Secondary | ICD-10-CM | POA: Diagnosis not present

## 2022-05-07 DIAGNOSIS — I252 Old myocardial infarction: Secondary | ICD-10-CM | POA: Diagnosis not present

## 2022-05-07 DIAGNOSIS — Z940 Kidney transplant status: Secondary | ICD-10-CM | POA: Diagnosis not present

## 2022-05-07 DIAGNOSIS — I4891 Unspecified atrial fibrillation: Secondary | ICD-10-CM | POA: Diagnosis not present

## 2022-05-07 DIAGNOSIS — I10 Essential (primary) hypertension: Secondary | ICD-10-CM | POA: Diagnosis not present

## 2022-05-07 DIAGNOSIS — Z7901 Long term (current) use of anticoagulants: Secondary | ICD-10-CM | POA: Diagnosis not present

## 2022-05-07 DIAGNOSIS — I6529 Occlusion and stenosis of unspecified carotid artery: Secondary | ICD-10-CM | POA: Diagnosis not present

## 2022-05-07 DIAGNOSIS — Z4822 Encounter for aftercare following kidney transplant: Secondary | ICD-10-CM | POA: Diagnosis not present

## 2022-05-07 DIAGNOSIS — Z5181 Encounter for therapeutic drug level monitoring: Secondary | ICD-10-CM | POA: Diagnosis not present

## 2022-05-07 DIAGNOSIS — Z79899 Other long term (current) drug therapy: Secondary | ICD-10-CM | POA: Diagnosis not present

## 2022-05-07 DIAGNOSIS — I151 Hypertension secondary to other renal disorders: Secondary | ICD-10-CM | POA: Diagnosis not present

## 2022-05-21 DIAGNOSIS — Z5181 Encounter for therapeutic drug level monitoring: Secondary | ICD-10-CM | POA: Diagnosis not present

## 2022-05-21 DIAGNOSIS — Z4822 Encounter for aftercare following kidney transplant: Secondary | ICD-10-CM | POA: Diagnosis not present

## 2022-05-21 DIAGNOSIS — Z79621 Long term (current) use of calcineurin inhibitor: Secondary | ICD-10-CM | POA: Diagnosis not present

## 2022-07-24 DIAGNOSIS — Z0100 Encounter for examination of eyes and vision without abnormal findings: Secondary | ICD-10-CM | POA: Diagnosis not present

## 2022-07-24 DIAGNOSIS — H2513 Age-related nuclear cataract, bilateral: Secondary | ICD-10-CM | POA: Diagnosis not present

## 2022-08-03 DIAGNOSIS — Z940 Kidney transplant status: Secondary | ICD-10-CM | POA: Diagnosis not present

## 2022-08-06 IMAGING — CT CT ABD-PELV W/O CM
2 of 4 series · 16 of 46 positions shown, 18 images · non-contrast
Comparison: CT abdomen pelvis dated 01/11/2017.

CLINICAL DATA: Abdominal pain and fever.

EXAM:
CT ABDOMEN AND PELVIS WITHOUT CONTRAST
TECHNIQUE: Multidetector CT imaging of the abdomen and pelvis was performed
following the standard protocol without IV contrast.

[Series 3: a/p w/o 5mm · axial · non-contrast · 0.90mm/px · z∈[-1113,-728]mm · 13 of 85 slices shown, 15 images]
[im 4/85  soft-tissue]
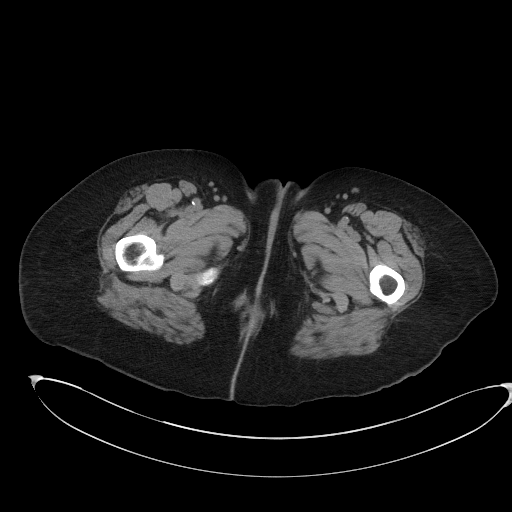
[im 4/85  bone]
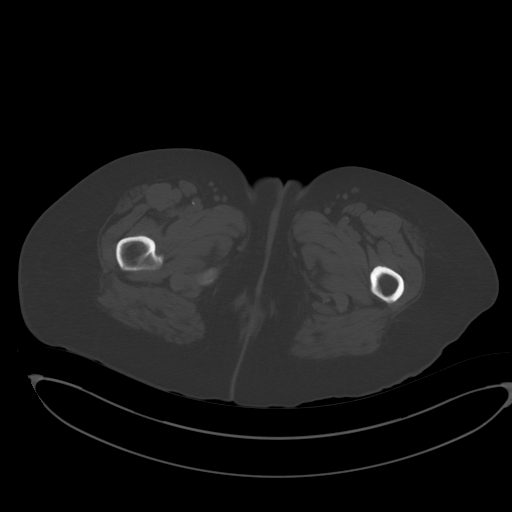
[im 11/85  soft-tissue]
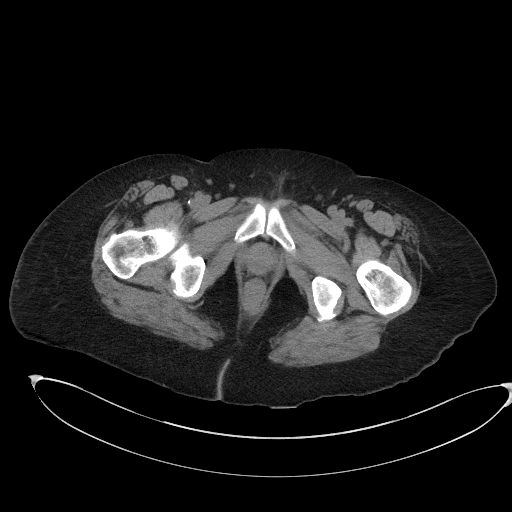
[im 18/85  soft-tissue]
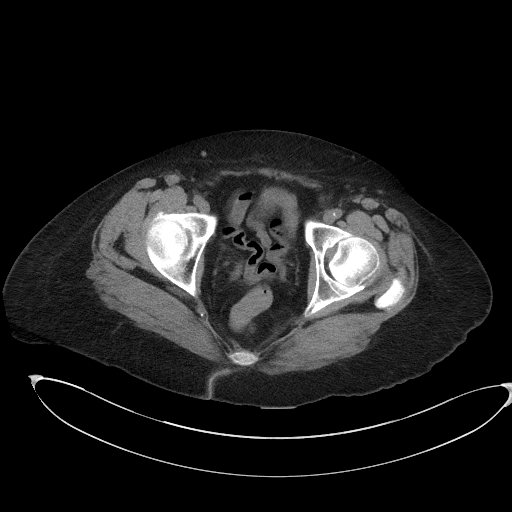
[im 25/85  soft-tissue]
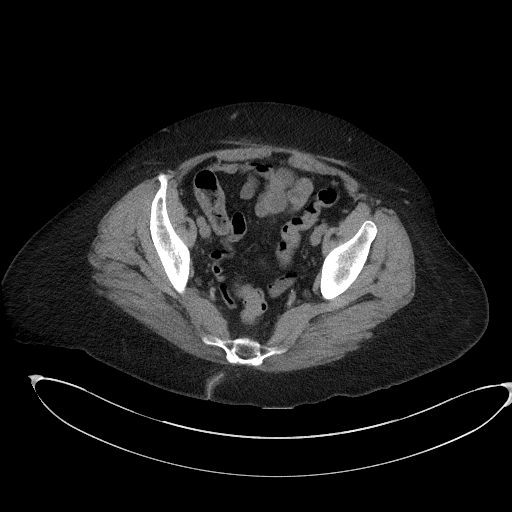
[im 29/85  soft-tissue]
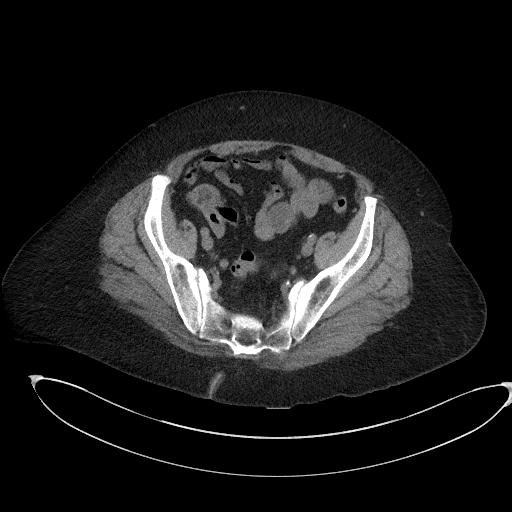
[im 36/85  soft-tissue]
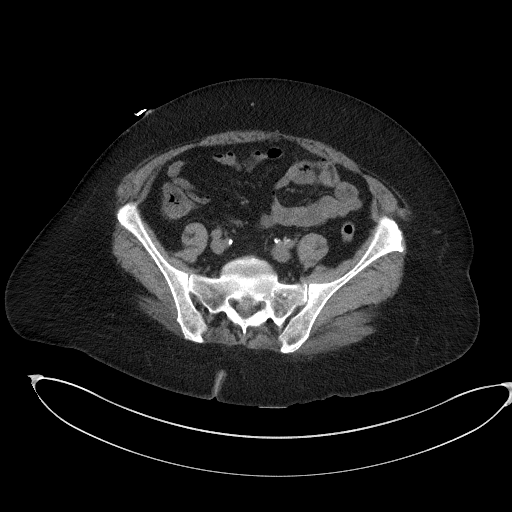
[im 43/85  soft-tissue]
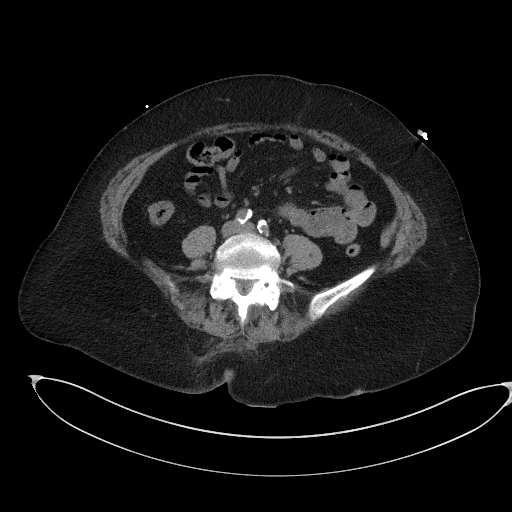
[im 50/85  soft-tissue]
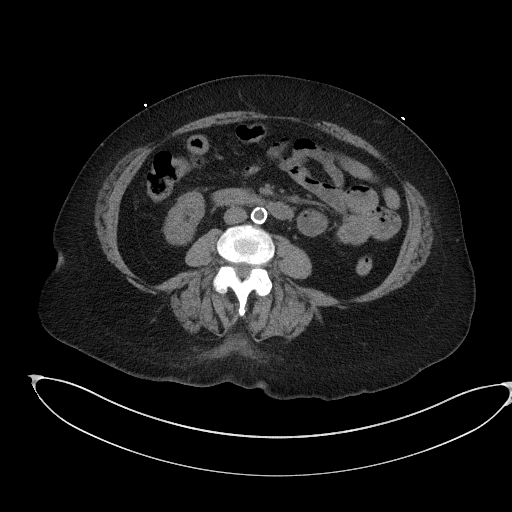
[im 57/85  soft-tissue]
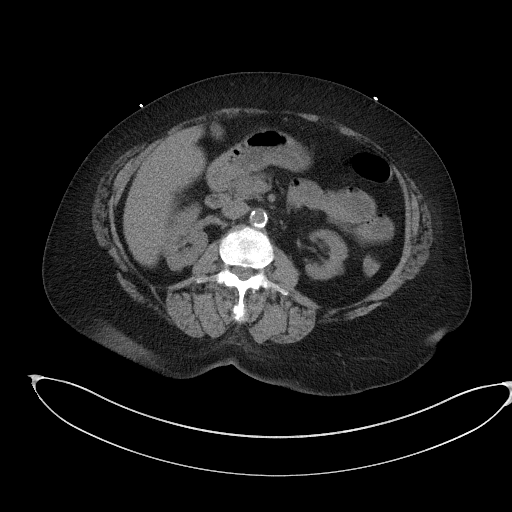
[im 57/85  bone]
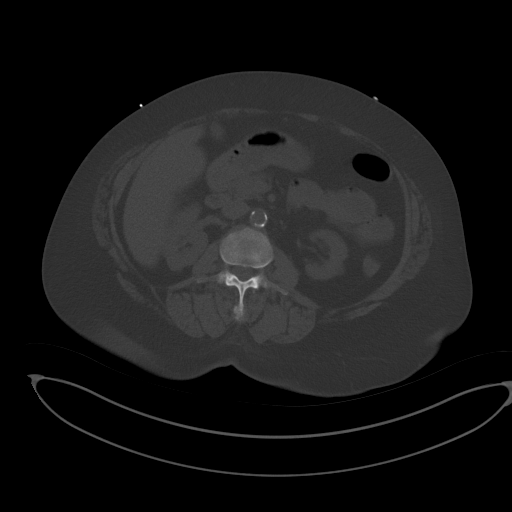
[im 60/85  soft-tissue]
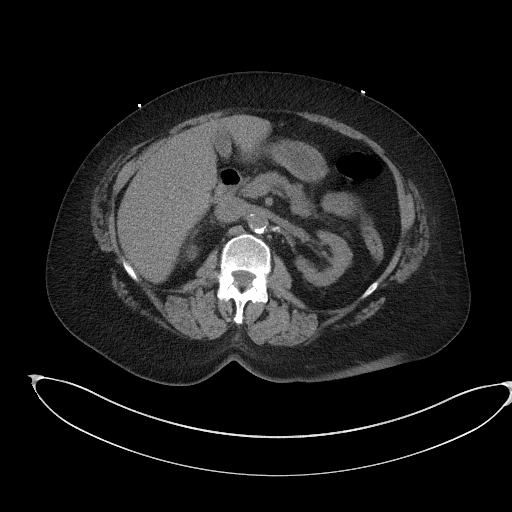
[im 67/85  soft-tissue]
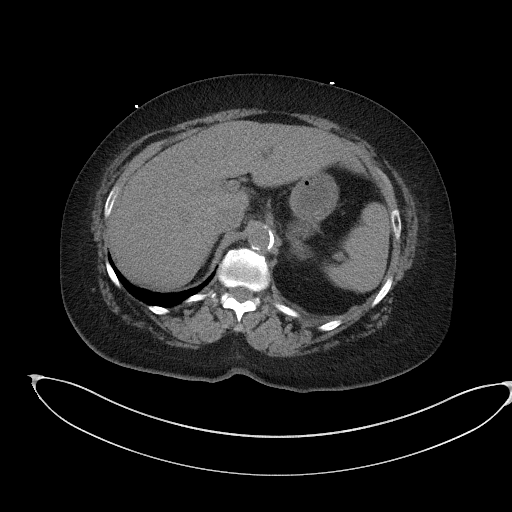
[im 74/85  soft-tissue]
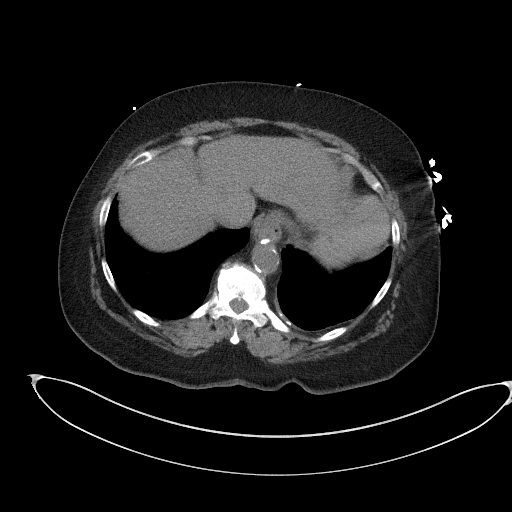
[im 81/85  soft-tissue]
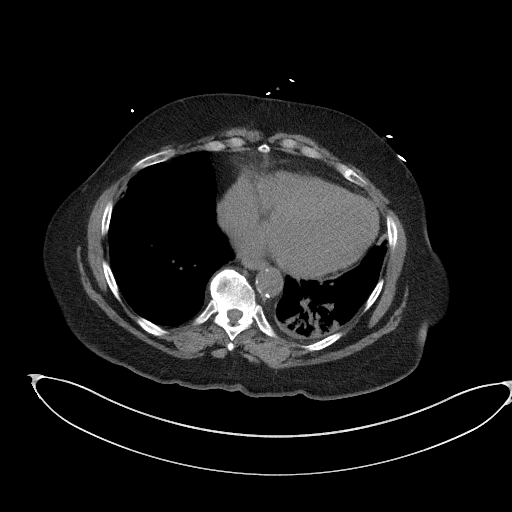

[Series 6: a/p w/o cor · coronal · non-contrast · 0.82mm/px · 3 of 126 slices shown]
[im 42/126  soft-tissue]
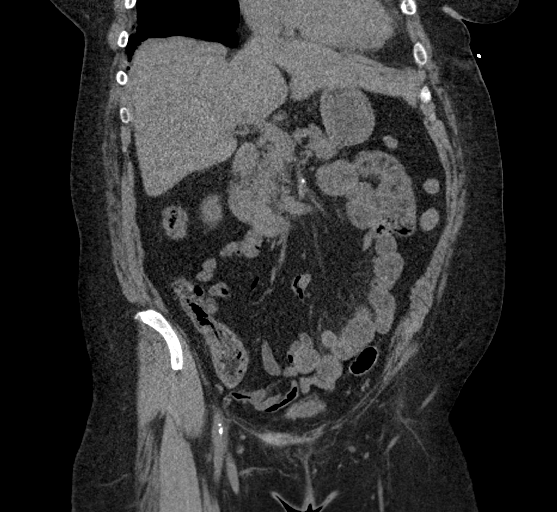
[im 56/126  soft-tissue]
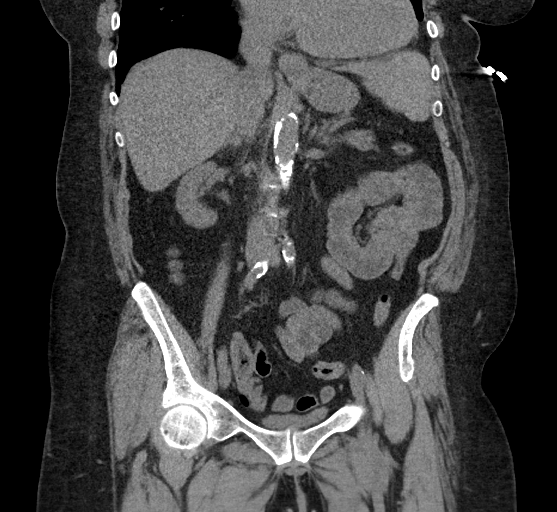
[im 70/126  soft-tissue]
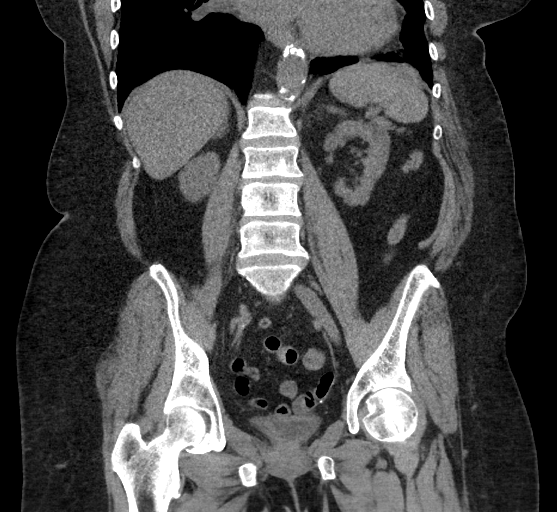

[16 of 46 positions shown; findings below may reference images not displayed]

FINDINGS: Evaluation of this exam is limited in the absence of intravenous
contrast.

Lower chest: Partially visualized left lung base consolidation most
consistent with pneumonia. Aspiration is not excluded. Clinical
correlation recommended.

No intra-abdominal free air or free fluid.

Hepatobiliary: No focal liver abnormality is seen. No gallstones,
gallbladder wall thickening, or biliary dilatation.

Pancreas: Unremarkable. No pancreatic ductal dilatation or
surrounding inflammatory changes.

Spleen: Normal in size without focal abnormality.

Adrenals/Urinary Tract: The adrenal glands unremarkable. There is a
2.5 cm hypodense lesion in the upper pole of the left kidney which
is not characterized on this noncontrast CT but likely a cyst. There
is no hydronephrosis or nephrolithiasis on either side. The
visualized ureters and urinary bladder appear unremarkable.

Stomach/Bowel: There is no bowel obstruction or active inflammation.
The appendix is normal.

Vascular/Lymphatic: Advanced aortoiliac atherosclerotic disease. The
IVC is unremarkable. No portal venous gas. There is no adenopathy.

Reproductive: Hysterectomy.  No adnexal masses

Other: The none

Musculoskeletal: Degenerative changes of the spine. No acute osseous
pathology.
IMPRESSION: 1. No acute intra-abdominal or pelvic pathology. No hydronephrosis
or nephrolithiasis.
2. Partially visualized left lung base consolidation most consistent
with pneumonia. Aspiration is not excluded.
3. Aortic Atherosclerosis (H62OF-5T8.8).

## 2022-08-12 DIAGNOSIS — D849 Immunodeficiency, unspecified: Secondary | ICD-10-CM | POA: Diagnosis not present

## 2022-08-12 DIAGNOSIS — Z79899 Other long term (current) drug therapy: Secondary | ICD-10-CM | POA: Diagnosis not present

## 2022-08-12 DIAGNOSIS — I10 Essential (primary) hypertension: Secondary | ICD-10-CM | POA: Diagnosis not present

## 2022-08-12 DIAGNOSIS — Z9189 Other specified personal risk factors, not elsewhere classified: Secondary | ICD-10-CM | POA: Diagnosis not present

## 2022-08-12 DIAGNOSIS — Z940 Kidney transplant status: Secondary | ICD-10-CM | POA: Diagnosis not present

## 2022-08-12 DIAGNOSIS — B259 Cytomegaloviral disease, unspecified: Secondary | ICD-10-CM | POA: Diagnosis not present

## 2022-08-19 DIAGNOSIS — Z940 Kidney transplant status: Secondary | ICD-10-CM | POA: Diagnosis not present

## 2022-08-19 DIAGNOSIS — Z79899 Other long term (current) drug therapy: Secondary | ICD-10-CM | POA: Diagnosis not present

## 2022-08-19 DIAGNOSIS — B259 Cytomegaloviral disease, unspecified: Secondary | ICD-10-CM | POA: Diagnosis not present

## 2022-08-19 DIAGNOSIS — I10 Essential (primary) hypertension: Secondary | ICD-10-CM | POA: Diagnosis not present

## 2022-08-19 DIAGNOSIS — D849 Immunodeficiency, unspecified: Secondary | ICD-10-CM | POA: Diagnosis not present

## 2022-08-19 DIAGNOSIS — Z9189 Other specified personal risk factors, not elsewhere classified: Secondary | ICD-10-CM | POA: Diagnosis not present

## 2022-08-26 ENCOUNTER — Telehealth: Payer: Self-pay | Admitting: Pharmacist

## 2022-08-26 DIAGNOSIS — I5022 Chronic systolic (congestive) heart failure: Secondary | ICD-10-CM

## 2022-08-26 NOTE — Telephone Encounter (Signed)
This patient has been identified as "high risk" and in the top 25% of risk stratification for [facility] accountable patients.  These patients were identified using a number of factors including # of hospitalizations, ED visits, HF exacerbations, elevated BP and A1c, and overall cost of care.   Referral placed for cosign by the PCP.  [facility] team to schedule once cosigned.  Consuello Lassalle, PharmD Clinical Pharmacist  [facility] [place] [phone]  

## 2022-08-27 ENCOUNTER — Encounter: Payer: Self-pay | Admitting: Family Medicine

## 2022-08-27 ENCOUNTER — Ambulatory Visit: Payer: Medicare HMO | Admitting: Family Medicine

## 2022-08-27 VITALS — BP 130/58 | HR 90 | Temp 97.8°F | Resp 16 | Ht 65.0 in | Wt 207.0 lb

## 2022-08-27 DIAGNOSIS — Z23 Encounter for immunization: Secondary | ICD-10-CM

## 2022-08-27 DIAGNOSIS — R059 Cough, unspecified: Secondary | ICD-10-CM

## 2022-08-27 DIAGNOSIS — Z8701 Personal history of pneumonia (recurrent): Secondary | ICD-10-CM | POA: Diagnosis not present

## 2022-08-27 LAB — POC COVID19 BINAXNOW: SARS Coronavirus 2 Ag: NEGATIVE

## 2022-08-27 MED ORDER — DOXYCYCLINE HYCLATE 100 MG PO TABS
100.0000 mg | ORAL_TABLET | Freq: Two times a day (BID) | ORAL | 0 refills | Status: DC
Start: 2022-08-27 — End: 2023-10-23

## 2022-08-27 NOTE — Patient Instructions (Signed)
START the Doxycycline twice daily- take w/ food GET over the counter Delsym cough syrup (or store brand generic) to help w/ cough USE your inhaler- 2 puffs every 4 hours while you're awake REST! If no improvement by early next week or if you are worsening at any time, please let us know so we can re-evaluate things Call with any questions or concerns Hang in there!!!

## 2022-08-27 NOTE — Progress Notes (Signed)
   Subjective:    Patient ID: Brittany Charles, female    DOB: 26-Jan-1951, 72 y.o.   MRN: [id number]  HPI Cough- pt reports sxs started Sunday w/ cough.  On Sunday had low grade fever but none currently.  Has coughed so hard she now has L sided abd pain.  Having post-tussive emesis.  + nasal congestion.  No body aches or chills.  No known sick contacts.  Is on multiple medicines that lower immune system- lupus and renal transplant.   Review of Systems For ROS see HPI     Objective:   Physical Exam Vitals reviewed.  Constitutional:      General: She is not in acute distress.    Appearance: Normal appearance. She is not ill-appearing.  HENT:     Head: Normocephalic and atraumatic.     Right Ear: Tympanic membrane and ear canal normal.     Left Ear: Tympanic membrane and ear canal normal.     Nose: Congestion present. No rhinorrhea.     Comments: No TTP over frontal or maxillary sinuses Eyes:     Extraocular Movements: Extraocular movements intact.     Conjunctiva/sclera: Conjunctivae normal.  Cardiovascular:     Rate and Rhythm: Normal rate and regular rhythm.  Pulmonary:     Effort: Pulmonary effort is normal.     Breath sounds: Wheezing (diffuse expiratory wheezes) present.  Musculoskeletal:     Cervical back: Normal range of motion and neck supple.  Lymphadenopathy:     Cervical: No cervical adenopathy.  Skin:    General: Skin is warm and dry.  Neurological:     General: No focal deficit present.     Mental Status: She is alert and oriented to person, place, and time.  Psychiatric:        Mood and Affect: Mood normal.        Behavior: Behavior normal.        Thought Content: Thought content normal.           Assessment & Plan:   Cough- new.  Pt w/ severe cough that has caused abd muscle strain and post-tussive emesis.  She has diffuse expiratory wheezes but no known hx of COPD.  She does have a hx of PNA and is immunosuppressed due to renal transplant.  Will  start abx to cover for bacterial infxn.  She is to use her inhaler on a schedule until feeling better.  OTC Delsym for cough.  Reviewed supportive care and red flags that should prompt return.  Pt expressed understanding and is in agreement w/ plan.

## 2022-08-28 ENCOUNTER — Telehealth: Payer: Self-pay | Admitting: Pharmacist

## 2022-08-28 NOTE — Progress Notes (Unsigned)
Care Management & Coordination Services Pharmacy Team   Reason for Encounter: Appointment Reminder   Contacted patient to confirm in office appointment with Erskine Emery, PharmD on 09/02/22 at 9AM. {US [facility] Outreach:[id number]}  Do you have any problems getting your medications? {yes/no:20286} If yes what types of problems are you experiencing? {Problems:27223}  What is your top health concern you would like to discuss at your upcoming visit?   Have you seen any other providers since your last visit with PCP? {yes/no:20286}   Chart review:  Recent office visits:  08/27/22 [name], MD - Family Medicine - Cough - COVID test ordered. Doxycycline (VIBRA-TABS) 100 MG tablet prescribed. Follow up as scheduled.   Recent consult visits:  08/29/22 [name] - Urgent Care - Shortness of breath - No notes available.   08/12/22 [name] MD - Decreased donor kidney recipient - No notes available.   Hospital visits:  None in previous 6 months   Star Rating Drugs:  Medication:   Last Fill: Day Supply  Rosuvastatin 20 MG tablet  08/02/22 30   Care Gaps: Annual wellness visit in last year? No  If Diabetic: Last eye exam / retinopathy screening: N/A Last diabetic foot exam: N/A   Future Appointments  Date Time Provider Department Center  09/02/2022  9:00 AM Erroll Luna, [facility] None    [employer], [address]

## 2022-08-29 DIAGNOSIS — R051 Acute cough: Secondary | ICD-10-CM | POA: Diagnosis not present

## 2022-08-29 DIAGNOSIS — R0602 Shortness of breath: Secondary | ICD-10-CM | POA: Diagnosis not present

## 2022-08-29 DIAGNOSIS — M329 Systemic lupus erythematosus, unspecified: Secondary | ICD-10-CM | POA: Diagnosis not present

## 2022-08-29 DIAGNOSIS — I509 Heart failure, unspecified: Secondary | ICD-10-CM | POA: Diagnosis not present

## 2022-08-29 DIAGNOSIS — J40 Bronchitis, not specified as acute or chronic: Secondary | ICD-10-CM | POA: Diagnosis not present

## 2022-09-02 ENCOUNTER — Ambulatory Visit: Payer: Medicare HMO | Admitting: Pharmacist

## 2022-09-02 NOTE — Progress Notes (Signed)
Care Management & Coordination Services Pharmacy Note  09/02/2022 Name:  Brittany Charles MRN:  [id number] DOB:  07-22-1950  Summary: Initial visit with PharmD.  Chronic conditions well controlled at this time she is just recovering from bronchitis.  She is overdue for routine physical as she has not been in since her kidney transplant.  Asker her to call and make appt and she agrees.  Not taking her Toprol due to no rx at this time.    Recommendations/Changes made from today's visit: Contact Dr. Anne Fu for metoprolol rx - send to [facility]   Follow up plan: FU 6 months CMA to FU  1 month to see if she has make physical appt   Subjective: Brittany Charles is an 72 y.o. year old female who is a primary patient of Brittany Charles, Brittany Partridge, MD.  The care coordination team was consulted for assistance with disease management and care coordination needs.    Engaged with patient by telephone for initial visit.  Recent office visits:  08/27/22 Brittany Rhymes, MD - Family Medicine - Cough - COVID test ordered. Doxycycline (VIBRA-TABS) 100 MG tablet prescribed. Follow up as scheduled.    Recent consult visits:  08/29/22 Brittany Charles - Urgent Care - Shortness of breath - No notes available.    08/12/22 Brittany Mares MD - Decreased donor kidney recipient - No notes available.    Hospital visits:  None in previous 6 months   Objective:  Lab Results  Component Value Date   CREATININE 4.50 (H) 09/02/2021   BUN 48 (H) 09/02/2021   GFRNONAA 11 (L) 05/23/2021   GFRAA 22 (L) 02/01/2020   NA 141 09/02/2021   K 3.9 09/02/2021   CALCIUM 9.2 05/23/2021   CO2 15 (L) 05/23/2021   GLUCOSE 99 09/02/2021    Lab Results  Component Value Date/Time   HGBA1C 6.5 (H) 12/31/2019 05:23 AM   HGBA1C 6.2 (H) 11/21/2018 12:00 PM    Last diabetic Eye exam:  Lab Results  Component Value Date/Time   HMDIABEYEEXA No Retinopathy 07/01/2021 12:00 AM    Last diabetic Foot exam: No results found for:  "HMDIABFOOTEX"   Lab Results  Component Value Date   CHOL 93 03/04/2021   HDL 45 03/04/2021   LDLCALC 37 03/04/2021   TRIG 54 03/04/2021   CHOLHDL 2.1 03/04/2021       Latest Ref Rng & Units 05/23/2021    2:06 PM 03/04/2021   12:13 AM 03/03/2021    9:58 PM  Hepatic Function  Total Protein 6.5 - 8.1 g/dL  7.1  7.6   Albumin 3.5 - 5.0 g/dL 3.8  2.8  3.0   AST 15 - 41 U/L  17  17   ALT 0 - 44 U/L  12  14   Alk Phosphatase 38 - 126 U/L  95  98   Total Bilirubin 0.3 - 1.2 mg/dL  0.6  0.6     Lab Results  Component Value Date/Time   TSH 1.297 10/17/2012 05:22 PM   TSH 1.663 08/03/2010 10:05 PM       Latest Ref Rng & Units 09/23/2021    9:16 AM 09/02/2021    7:19 AM 08/26/2021    9:01 AM  CBC  Hemoglobin 12.0 - 15.0 g/dL 9.9  78.2  9.4   Hematocrit 36.0 - 46.0 %  32.0      Lab Results  Component Value Date/Time   VD25OH 30 10/17/2012 04:58 PM   VITAMINB12 262 03/04/2021 08:03 PM  Clinical ASCVD: Yes  The ASCVD Risk score (Arnett DK, et al., 2019) failed to calculate for the following reasons:   The valid total cholesterol range is 130 to 320 mg/dL        1/61/0960    4:54 PM 01/16/2021   12:04 PM 11/21/2018   10:48 AM  Depression screen PHQ 2/9  Decreased Interest 1 0 0  Down, Depressed, Hopeless 0 0 0  PHQ - 2 Score 1 0 0  Altered sleeping 1 0   Tired, decreased energy 1 0   Change in appetite 1 0   Feeling bad or failure about yourself  0 0   Trouble concentrating 0 0   Moving slowly or fidgety/restless 0 0   Suicidal thoughts 0 0   PHQ-9 Score 4 0   Difficult doing work/chores Somewhat difficult Not difficult at all      Social History   Tobacco Use  Smoking Status Never  Smokeless Tobacco Never   BP Readings from Last 3 Encounters:  08/27/22 (!) 130/58  03/23/22 (!) 152/82  09/23/21 (!) 141/46   Pulse Readings from Last 3 Encounters:  08/27/22 90  03/23/22 83  09/02/21 70   Wt Readings from Last 3 Encounters:  08/27/22 207 lb (93.9 kg)   03/23/22 197 lb 9.6 oz (89.6 kg)  09/02/21 192 lb (87.1 kg)   BMI Readings from Last 3 Encounters:  08/27/22 34.45 kg/m  03/23/22 32.88 kg/m  09/02/21 31.95 kg/m    Allergies  Allergen Reactions   Zithromax [Azithromycin] Nausea And Vomiting    Medications Reviewed Today     Reviewed by Brittany Charles, [facility] (Pharmacist) on 09/02/22 at 0932  Med List Status: <None>   Medication Order Taking? Sig Documenting Provider Last Dose Status Informant  acetaminophen (TYLENOL) 325 MG tablet [id number] Yes Take 650 mg by mouth every 6 (six) hours as needed for moderate pain. [provider] Taking Active Self  albuterol (VENTOLIN HFA) 108 (90 Base) MCG/ACT inhaler [id number] Yes Inhale 2 puffs into the lungs every 6 (six) hours as needed for wheezing or shortness of breath. Cathren Harsh, MD Taking Active Self  allopurinol (ZYLOPRIM) 100 MG tablet [id number] Yes TAKE 1 TABLET EVERY DAY Peyton Najjar, MD Taking Active Self           Med Note Ernest Mallick, MINDY L   (Age 72)   Mon Mar 23, 2022  8:12 AM) (Age 72)    DILT-XR 180 MG 24 hr capsule [id number] Yes Take 180 mg by mouth daily. [provider] Taking Active Self  doxazosin (CARDURA) 2 MG tablet [id number] Yes Take 2 mg by mouth 2 (two) times daily. [provider] Taking Active Self  doxycycline (VIBRA-TABS) 100 MG tablet [id number] Yes Take 1 tablet (100 mg total) by mouth 2 (two) times daily. Sheliah Hatch, MD Taking Active   ELIQUIS 5 MG TABS tablet [id number] Yes TAKE 1 TABLET(5 MG) BY MOUTH TWICE DAILY Jake Bathe, MD Taking Active   furosemide (LASIX) 80 MG tablet [id number] Yes Take 80 mg by mouth daily as needed (swelling). [provider] Taking Active Self           Med Note Bronwen Betters Mar 23, 2022  8:12 AM)    hydrALAZINE (APRESOLINE) 50 MG tablet [id number] Yes TAKE 1 TABLET THREE TIMES DAILY Peyton Najjar, MD Taking Active Self  Menthol, Topical Analgesic, (BIOFREEZE) 4 % GEL [id number] Yes  Apply 1 application. topically 3 (three) times daily as needed (  pain). [provider] Taking Active Self  metoprolol succinate (TOPROL-XL) 25 MG 24 hr tablet [id number] Yes Take 1 tablet (25 mg total) by mouth daily. Take with or immediately following a meal. Kc, Ramesh, MD Taking Active Self  mycophenolate (MYFORTIC) 180 MG EC tablet [id number] Yes Take 360 mg by mouth 2 (two) times daily. [provider] Taking Active   pantoprazole (PROTONIX) 40 MG tablet [id number] Yes Take 40 mg by mouth daily. [provider] Taking Active   prednisoLONE 5 MG TABS tablet [id number] Yes Take by mouth. [provider] Taking Active   rosuvastatin (CRESTOR) 20 MG tablet [id number] Yes Take 1 tablet (20 mg total) by mouth daily. Dyann Kief, PA-C Taking Active Self  sodium bicarbonate 650 MG tablet [id number] Yes Take 1,300 mg by mouth 2 (two) times daily. [provider] Taking Active Self  sulfamethoxazole-trimethoprim (BACTRIM) 400-80 MG tablet [id number] Yes Take 1 tablet by mouth 3 (three) times a week. [provider] Taking Active   tacrolimus (PROGRAF) 1 MG capsule [id number] Yes Take 3 mg by mouth in the AM, take 2 mg by mouth in the PM [provider] Taking Active   Patient not taking:  Discontinued 09/02/22 0915 (Discontinued by provider)   zinc sulfate 220 (50 Zn) MG capsule [id number] No Take 1 capsule (220 mg total) by mouth daily.  Patient not taking: Reported on 09/02/2022   Cathren Harsh, MD Not Taking Active Self            SDOH:  (Social Determinants of Health) assessments and interventions performed: Yes Financial Resource Strain: Low Risk  (09/02/2022)   Overall Financial Resource Strain (CARDIA)    Difficulty of Paying Living Expenses: Not very hard   Food Insecurity: No Food Insecurity (09/02/2022)   Hunger Vital Sign    Worried About Running Out of Food in the Last Year: Never true    Ran Out of Food in the Last Year:  Never true      Medication Assistance: None required.  Patient affirms current coverage meets needs.  Medication Access: Within the past 30 days, how often has patient missed a dose of medication? 30 (metoprolol) Is a pillbox or other method used to improve adherence? Yes  Factors that may affect medication adherence?  Refill issues Are meds synced by current pharmacy? No  Are meds delivered by current pharmacy? No  Does patient experience delays in picking up medications due to transportation concerns? No   [facility] Reviewed: Is patient disadvantaged to use [facility]?: Yes  Current Rx insurance plan: [employer] Name and location of Current pharmacy:  [id number] - [place], [place] - [address] AT [facility] [address] [place] [address] Phone: [phone] Fax: [phone]  [facility] - [place], [place] - [address] [place] [address] Phone: [phone] Fax: [phone]  [id number] - [place], [place] - [address] AT [facility] [address] [place] [address] Phone: [phone] Fax: [phone]  [facility] services reviewed with patient today?: Yes  Patient requests to transfer care to [facility]?: No  Reason patient declined to change pharmacies: Disadvantaged due to insurance/mail order  Compliance/Adherence/Medication fill history: Care Gaps: Rosuvastatin 20 MG tablet     08/02/22            30     Care Gaps: Annual wellness visit in last  year? No   Assessment/Plan   Hypertension (BP goal <130/80) -Controlled -Current treatment: Diltiazem 180 24hr capsule Appropriate, Effective, Safe, Accessible Doxazosin  bid Appropriate, Effective, Safe, Accessible Metoprolol XL  Appropriate, Effective, Safe, Query accessible Hydralazine  tid prn if BP > 140Appropriate, Effective, Safe,  Accessible -Medications previously tried: none noted  -Current home readings: 130-140/70-80s  -Current exercise habits: active with her great grandchildren -Denies hypotensive/hypertensive symptoms -Educated on BP goals and benefits of medications for prevention of heart attack, stroke and kidney damage; Exercise goal of 150 minutes per week; Importance of home blood pressure monitoring; -Counseled to monitor BP at home as current, document, and provide log at future appointments -Recommended to continue current medication Reports she has not been taking her metoprolol b ecause it ran out and she has not gotten refills.  Will reach out to cardiology on this to make sure she has appropriate rx.  Explained MOA for metoprolol and why patient should be on it.  Hyperlipidemia: (LDL goal < 70) -Controlled -Current treatment: Rosuvastatin  Appropriate, Effective, Safe, Accessible -Medications previously tried: none noted   -Educated on Cholesterol goals;  Benefits of statin for ASCVD risk reduction; Importance of limiting foods high in cholesterol; -Recommended to continue current medication Due for updated LDL.  She has not had a physical since her kidney transplant.  Patient agrees to call and schedule physical after our call today.  Recommend recheck fasting lipids at this visit.  Atrial Fibrillation (Goal: prevent stroke and major bleeding) -Controlled  -Current treatment: Rate control: Metoprolol XL  Appropriate, Query effective, ,  Anticoagulation: Eliquis  Appropriate, Effective, Safe, Accessible -Medications previously tried: none noted -Home BP and HR readings: controlled in office  -Counseled on increased risk of stroke due to Afib and benefits of anticoagulation for stroke prevention; importance of adherence to anticoagulant exactly as prescribed; -Recommended to continue current medication She has not been taking her metoprolol due to lack of rx for this.   Discussed importance of rate control on stroke risk reduction.  Will reach out to cardiology for new rx so she can be on directed therapy for afib.  She would [facility].   Heart Failure (Goal: manage symptoms and prevent exacerbations) -Controlled -Last ejection fraction: 55%  -HF type: HFpEF (EF > 50%)  -Current treatment: Furosemide  Appropriate, Effective, Safe, Accessible Metoprolol XL  Appropriate, Query effective, ,   -Current home daily weights: taking daily, stays consistent -Educated on Benefits of medications for managing symptoms and prolonging life Importance of weighing daily; if you gain more than 3 pounds in one day or 5 pounds in one week, contact providers and take one of your Lasix tablets.  Very important to monitor this to prevent HF exacerbation. -Counseled on diet and exercise extensively Recommended to continue current medication Will consult cardiology on metoprolol to get new rx for patient.  Willa Frater, PharmD Clinical Pharmacist  [facility] [phone]

## 2022-09-09 DIAGNOSIS — Z79621 Long term (current) use of calcineurin inhibitor: Secondary | ICD-10-CM | POA: Diagnosis not present

## 2022-09-09 DIAGNOSIS — Z4822 Encounter for aftercare following kidney transplant: Secondary | ICD-10-CM | POA: Diagnosis not present

## 2022-09-09 DIAGNOSIS — D849 Immunodeficiency, unspecified: Secondary | ICD-10-CM | POA: Diagnosis not present

## 2022-09-09 DIAGNOSIS — Z940 Kidney transplant status: Secondary | ICD-10-CM | POA: Diagnosis not present

## 2022-09-09 DIAGNOSIS — E139 Other specified diabetes mellitus without complications: Secondary | ICD-10-CM | POA: Diagnosis not present

## 2022-09-09 DIAGNOSIS — J209 Acute bronchitis, unspecified: Secondary | ICD-10-CM | POA: Diagnosis not present

## 2022-09-09 DIAGNOSIS — I10 Essential (primary) hypertension: Secondary | ICD-10-CM | POA: Diagnosis not present

## 2022-09-09 DIAGNOSIS — Z5181 Encounter for therapeutic drug level monitoring: Secondary | ICD-10-CM | POA: Diagnosis not present

## 2022-10-07 ENCOUNTER — Ambulatory Visit (INDEPENDENT_AMBULATORY_CARE_PROVIDER_SITE_OTHER): Payer: Medicare HMO | Admitting: *Deleted

## 2022-10-07 DIAGNOSIS — Z1231 Encounter for screening mammogram for malignant neoplasm of breast: Secondary | ICD-10-CM

## 2022-10-07 DIAGNOSIS — Z780 Asymptomatic menopausal state: Secondary | ICD-10-CM | POA: Diagnosis not present

## 2022-10-07 DIAGNOSIS — Z0000 Encounter for general adult medical examination without abnormal findings: Secondary | ICD-10-CM | POA: Diagnosis not present

## 2022-10-07 NOTE — Progress Notes (Signed)
Subjective:   Brittany Charles is a 72 y.o. female who presents for an Initial Medicare Annual Wellness Visit.  I connected with  Lewie Chamber on 10/07/22 by a telephone enabled telemedicine application and verified that I am speaking with the correct person using two identifiers.   I discussed the limitations of evaluation and management by telemedicine. The patient expressed understanding and agreed to proceed.  Patient location: home  Provider location: telephone/home    Review of Systems     Cardiac Risk Factors include: advanced age (>72men, >71 women);family history of premature cardiovascular disease;hypertension;sedentary lifestyle     Objective:    Today's Vitals   There is no height or weight on file to calculate BMI.     10/07/2022    9:04 AM 09/02/2021    6:48 AM 07/10/2021    6:45 AM 05/27/2021    7:38 AM 01/01/2020   11:39 AM 03/09/2017    1:00 PM 03/02/2017   10:14 AM  Advanced Directives  Does Patient Have a Medical Advance Directive? No No No No No No No  Would patient like information on creating a medical advance directive? No - Patient declined   No - Patient declined No - Patient declined      Current Medications (verified) Outpatient Encounter Medications as of 10/07/2022  Medication Sig   acetaminophen (TYLENOL) 325 MG tablet Take 650 mg by mouth every 6 (six) hours as needed for moderate pain.   albuterol (VENTOLIN HFA) 108 (90 Base) MCG/ACT inhaler Inhale 2 puffs into the lungs every 6 (six) hours as needed for wheezing or shortness of breath.   allopurinol (ZYLOPRIM) 100 MG tablet TAKE 1 TABLET EVERY DAY   DILT-XR 180 MG 24 hr capsule Take 180 mg by mouth daily.   doxazosin (CARDURA) 2 MG tablet Take 2 mg by mouth 2 (two) times daily.   doxycycline (VIBRA-TABS) 100 MG tablet Take 1 tablet (100 mg total) by mouth 2 (two) times daily.   ELIQUIS 5 MG TABS tablet TAKE 1 TABLET(5 MG) BY MOUTH TWICE DAILY   furosemide (LASIX) 80 MG tablet Take  80 mg by mouth daily as needed (swelling).   hydrALAZINE (APRESOLINE) 50 MG tablet TAKE 1 TABLET THREE TIMES DAILY   Menthol, Topical Analgesic, (BIOFREEZE) 4 % GEL Apply 1 application. topically 3 (three) times daily as needed (pain).   mycophenolate (MYFORTIC) 180 MG EC tablet Take 360 mg by mouth 2 (two) times daily.   pantoprazole (PROTONIX) 40 MG tablet Take 40 mg by mouth daily.   prednisoLONE 5 MG TABS tablet Take by mouth.   rosuvastatin (CRESTOR) 20 MG tablet Take 1 tablet (20 mg total) by mouth daily.   sodium bicarbonate 650 MG tablet Take 1,300 mg by mouth 2 (two) times daily.   sulfamethoxazole-trimethoprim (BACTRIM) 400-80 MG tablet Take 1 tablet by mouth 3 (three) times a week.   tacrolimus (PROGRAF) 1 MG capsule Take 3 mg by mouth in the AM, take 2 mg by mouth in the PM   metoprolol succinate (TOPROL-XL) 25 MG 24 hr tablet Take 1 tablet (25 mg total) by mouth daily. Take with or immediately following a meal.   zinc sulfate 220 (50 Zn) MG capsule Take 1 capsule (220 mg total) by mouth daily. (Patient not taking: Reported on 09/02/2022)   No facility-administered encounter medications on file as of 10/07/2022.    Allergies (verified) Zithromax [azithromycin]   History: Past Medical History:  Diagnosis Date   Anemia  Arthritis    CHF (congestive heart failure) (HCC)    Colon polyps    COVID 2020   hospitalized at [facility]   Fibroids    GERD (gastroesophageal reflux disease)    Hypertension    Lupus (systemic lupus erythematosus) (HCC)    Myocardial infarction (HCC)    Pneumonia    Renal insufficiency    CKD stage 5- not on dialysis (07/08/21)   Past Surgical History:  Procedure Laterality Date   ABDOMINAL HYSTERECTOMY  10/27/2000   TAH, BSO   AV FISTULA PLACEMENT Left 05/27/2021   Procedure: LEFT BRACHIOCEPHALIC ARTERIOVENOUS (AV) FISTULA CREATION;  Surgeon: Victorino Sparrow, MD;  Location: [facility] OR;  Service: Vascular;  Laterality: Left;  with peripheral nerve  block   BIOPSY  09/02/2021   Procedure: BIOPSY;  Surgeon: Napoleon Form, MD;  Location: [facility] ENDOSCOPY;  Service: Endoscopy;;   COLONOSCOPY WITH PROPOFOL N/A 09/02/2021   Procedure: COLONOSCOPY WITH PROPOFOL;  Surgeon: Napoleon Form, MD;  Location: [facility] ENDOSCOPY;  Service: Endoscopy;  Laterality: N/A;   LEFT AND RIGHT HEART CATHETERIZATION WITH CORONARY ANGIOGRAM Right 04/21/2011   Procedure: LEFT AND RIGHT HEART CATHETERIZATION WITH CORONARY ANGIOGRAM;  Surgeon: Tonny Bollman, MD;  Location: [facility] CATH LAB;  Service: Cardiovascular;  Laterality: Right;   LIGATION OF ARTERIOVENOUS  FISTULA Left 07/10/2021   Procedure: LEFT ARM BRANCH LIGATION OF ARTERIOVENOUS  FISTULA;  Surgeon: Victorino Sparrow, MD;  Location: [facility] OR;  Service: Vascular;  Laterality: Left;  PERIPHERAL NERVE BLOCK   POLYPECTOMY  09/02/2021   Procedure: POLYPECTOMY;  Surgeon: Napoleon Form, MD;  Location: [facility]OSCOPY;  Service: Endoscopy;;   TONSILLECTOMY     Family History  Problem Relation Age of Onset   Hypertension Mother    Heart disease Mother        CHF   Stroke Mother    Diabetes Mother    Hyperlipidemia Father    Stroke Father    Hypertension Father    Asthma Brother    Kidney disease Brother    Stroke Maternal Grandmother    Colon cancer Other        several cousins   Breast cancer Other    Colon cancer Son    Hypertension Son    Social History   Socioeconomic History   Marital status: Single    Spouse name: Not on file   Number of children: 1   Years of education: Not on file   Highest education level: Not on file  Occupational History   Occupation: retired Fish farm manager  Tobacco Use   Smoking status: Never   Smokeless tobacco: Never  Vaping Use   Vaping Use: Never used  Substance and Sexual Activity   Alcohol use: No   Drug use: No   Sexual activity: Never  Other Topics Concern   Not on file  Social History Narrative   Not on file   Social Determinants of Health   Financial  Resource Strain: Low Risk  (10/07/2022)   Overall Financial Resource Strain (CARDIA)    Difficulty of Paying Living Expenses: Not hard at all  Food Insecurity: No Food Insecurity (10/07/2022)   Hunger Vital Sign    Worried About Running Out of Food in the Last Year: Never true    Ran Out of Food in the Last Year: Never true  Transportation Needs: No Transportation Needs (10/07/2022)   PRAPARE - Administrator, Civil Service (Medical): No    Lack of Transportation (Non-Medical):  No  Physical Activity: Inactive (10/07/2022)   Exercise Vital Sign    Days of Exercise per Week: 0 days    Minutes of Exercise per Session: 0 min  Stress: No Stress Concern Present (10/07/2022)   [employer] of Occupational Health - Occupational Stress Questionnaire    Feeling of Stress : Not at all  Social Connections: Unknown (10/07/2022)   Social Connection and Isolation Panel [NHANES]    Frequency of Communication with Friends and Family: More than three times a week    Frequency of Social Gatherings with Friends and Family: Once a week    Attends Religious Services: More than 4 times per year    Active Member of [employer] or Organizations: No    Attends Engineer, structural: Never    Marital Status: Not on file    Tobacco Counseling Counseling given: Not Answered   Clinical Intake:  Pre-visit preparation completed: Yes  Pain : No/denies pain        How often do you need to have someone help you when you read instructions, pamphlets, or other written materials from your doctor or pharmacy?: 1 - Never  Diabetic?  no  Interpreter Needed?: No  Information entered by :: Remi Haggard LPN   Activities of Daily Living    10/07/2022    9:05 AM 10/06/2022   10:57 AM  In your present state of health, do you have any difficulty performing the following activities:  Hearing? 0 0  Vision? 0 0  Difficulty concentrating or making decisions? 0 0  Walking or climbing stairs? 1 1   Dressing or bathing? 0 0  Doing errands, shopping? 0 0  Preparing Food and eating ? N N  Using the Toilet? N N  In the past six months, have you accidently leaked urine? N N  Do you have problems with loss of bowel control? N N  Managing your Medications? N N  Managing your Finances? N N  Housekeeping or managing your Housekeeping? N N    Patient Care Team: Shade Flood, MD as PCP - General (Family Medicine) Jake Bathe, MD as PCP - Cardiology (Cardiology)  Indicate any recent Medical Services you may have received from other than [facility] providers in the past year (date may be approximate).     Assessment:   This is a routine wellness examination for Kally.  Hearing/Vision screen Hearing Screening - Comments:: No trouble hearing Vision Screening - Comments:: Up to date [facility]  Dietary issues and exercise activities discussed: Current Exercise Habits: The patient does not participate in regular exercise at present   Goals Addressed             This Visit's Progress    Patient Stated       Continue current lifestyle       Depression Screen    10/07/2022    9:09 AM 08/27/2022    2:39 PM 01/16/2021   12:04 PM 11/21/2018   10:48 AM 03/28/2015    9:15 AM 02/12/2015    9:00 AM 11/17/2014    1:31 PM  PHQ 2/9 Scores  PHQ - 2 Score 0 1 0 0 0 0 0  PHQ- 9 Score 0 4 0        Fall Risk    10/07/2022    9:03 AM 10/06/2022   10:57 AM 08/27/2022    2:39 PM 01/16/2021   12:04 PM 11/21/2018   10:48 AM  Fall Risk   Falls in  the past year? 0 0 0 0 0  Number falls in past yr: 0  0 0 0  Injury with Fall? 0  0 0 0  Risk for fall due to :   No Fall Risks No Fall Risks   Follow up Falls evaluation completed;Education provided;Falls prevention discussed  Falls evaluation completed  Falls evaluation completed    FALL RISK PREVENTION PERTAINING TO THE HOME:  Any stairs in or around the home? No  If so, are there any without handrails? No  Home free of loose throw  rugs in walkways, pet beds, electrical cords, etc? Yes  Adequate lighting in your home to reduce risk of falls? Yes   ASSISTIVE DEVICES UTILIZED TO PREVENT FALLS:  Life alert? No  Use of a cane, walker or w/c? No  Grab bars in the bathroom? Yes  Shower chair or bench in shower? Yes  Elevated toilet seat or a handicapped toilet? Yes   TIMED UP AND GO:  Was the test performed? No .    Cognitive Function:        10/07/2022    9:07 AM  6CIT Screen  What Year? 0 points  What month? 0 points  What time? 0 points  Count back from 20 0 points  Months in reverse 2 points  Repeat phrase 0 points  Total Score 2 points    Immunizations Immunization History  Administered Date(s) Administered   Covid-19, Mrna,Vaccine(Spikevax)65yrs and older 03/26/2022   Fluad Quad(high Dose 65+) 01/16/2021   PFIZER Comirnaty(Gray Top)Covid-19 Tri-Sucrose Vaccine 07/02/2019, 07/25/2019, 04/17/2020   PFIZER(Purple Top)SARS-COV-2 Vaccination 07/02/2019, 07/25/2019   Pneumococcal Conjugate-13 11/21/2018   Pneumococcal Polysaccharide-23 01/12/2017   Zoster Recombinat (Shingrix) 01/16/2021, 03/20/2021    TDAP status: Due, Education has been provided regarding the importance of this vaccine. Advised may receive this vaccine at local pharmacy or Health Dept. Aware to provide a copy of the vaccination record if obtained from local pharmacy or Health Dept. Verbalized acceptance and understanding.  Flu Vaccine status: Up to date  Pneumococcal vaccine status: Up to date  Covid-19 vaccine status: Information provided on how to obtain vaccines.   Qualifies for Shingles Vaccine? No   Zostavax completed Yes   Shingrix Completed?: Yes  Screening Tests Health Maintenance  Topic Date Due   Hepatitis C Screening  Never done   DTaP/Tdap/Td (1 - Tdap) Never done   MAMMOGRAM  Never done   DEXA SCAN  Never done   COVID-19 Vaccine (7 - 2023-24 season) 05/21/2022   Colonoscopy  09/03/2022   INFLUENZA VACCINE   12/10/2022   Medicare Annual Wellness (AWV)  10/07/2023   Pneumonia Vaccine 52+ Years old  Completed   Zoster Vaccines- Shingrix  Completed   HPV VACCINES  Aged Out    Health Maintenance  Health Maintenance Due  Topic Date Due   Hepatitis C Screening  Never done   DTaP/Tdap/Td (1 - Tdap) Never done   MAMMOGRAM  Never done   DEXA SCAN  Never done   COVID-19 Vaccine (7 - 2023-24 season) 05/21/2022   Colonoscopy  09/03/2022    Colorectal cancer screening: Type of screening: Colonoscopy. Completed 2023. Repeat every 1 years  Mammogram status: Ordered  . Pt provided with contact info and advised to call to schedule appt.   Bone Density status: Ordered  . Pt provided with contact info and advised to call to schedule appt.  Lung Cancer Screening: (Low Dose CT Chest recommended if Age 76-80 years, 30 pack-year currently smoking OR  have quit w/in 15years.) does not qualify.   Lung Cancer Screening Referral:   Additional Screening:  Hepatitis C Screening never done  Vision Screening: Recommended annual ophthalmology exams for early detection of glaucoma and other disorders of the eye. Is the patient up to date with their annual eye exam?  Yes  Who is the provider or what is the name of the office in which the patient attends annual eye exams? [facility] If pt is not established with a provider, would they like to be referred to a provider to establish care? No .   Dental Screening: Recommended annual dental exams for proper oral hygiene  Community Resource Referral / Chronic Care Management: CRR required this visit?  No   CCM required this visit?  No      Plan:     I have personally reviewed and noted the following in the patient's chart:   Medical and social history Use of alcohol, tobacco or illicit drugs  Current medications and supplements including opioid prescriptions. Patient is not currently taking opioid prescriptions. Functional ability and  status Nutritional status Physical activity Advanced directives List of other physicians Hospitalizations, surgeries, and ER visits in previous 12 months Vitals Screenings to include cognitive, depression, and falls Referrals and appointments  In addition, I have reviewed and discussed with patient certain preventive protocols, quality metrics, and best practice recommendations. A written personalized care plan for preventive services as well as general preventive health recommendations were provided to patient.     Remi Haggard, LPN   [id number]   Nurse Notes:

## 2022-10-07 NOTE — Patient Instructions (Signed)
Brittany Charles , Thank you for taking time to come for your Medicare Wellness Visit. I appreciate your ongoing commitment to your health goals. Please review the following plan we discussed and let me know if I can assist you in the future.   Screening recommendations/referrals: Colonoscopy: due  Education provided Mammogram: Education provided Bone Density: Education provided Recommended yearly ophthalmology/optometry visit for glaucoma screening and checkup Recommended yearly dental visit for hygiene and checkup  Vaccinations: Influenza vaccine: up to date Pneumococcal vaccine: up to date Tdap vaccine: Education provided Shingles vaccine: up to date    Advanced directives: Education provided  Conditions/risks identified   Preventive Care 65 Years and Older, Female Preventive care refers to lifestyle choices and visits with your health care provider that can promote health and wellness. What does preventive care include? A yearly physical exam. This is also called an annual well check. Dental exams once or twice a year. Routine eye exams. Ask your health care provider how often you should have your eyes checked. Personal lifestyle choices, including: Daily care of your teeth and gums. Regular physical activity. Eating a healthy diet. Avoiding tobacco and drug use. Limiting alcohol use. Practicing safe sex. Taking low-dose aspirin every day. Taking vitamin and mineral supplements as recommended by your health care provider. What happens during an annual well check? The services and screenings done by your health care provider during your annual well check will depend on your age, overall health, lifestyle risk factors, and family history of disease. Counseling  Your health care provider may ask you questions about your: Alcohol use. Tobacco use. Drug use. Emotional well-being. Home and relationship well-being. Sexual activity. Eating habits. History of falls. Memory and  ability to understand (cognition). Work and work Astronomer. Reproductive health. Screening  You may have the following tests or measurements: Height, weight, and BMI. Blood pressure. Lipid and cholesterol levels. These may be checked every 5 years, or more frequently if you are over 20 years old. Skin check. Lung cancer screening. You may have this screening every year starting at age 79 if you have a 30-pack-year history of smoking and currently smoke or have quit within the past 15 years. Fecal occult blood test (FOBT) of the stool. You may have this test every year starting at age 14. Flexible sigmoidoscopy or colonoscopy. You may have a sigmoidoscopy every 5 years or a colonoscopy every 10 years starting at age 83. Hepatitis C blood test. Hepatitis B blood test. Sexually transmitted disease (STD) testing. Diabetes screening. This is done by checking your blood sugar (glucose) after you have not eaten for a while (fasting). You may have this done every 1-3 years. Bone density scan. This is done to screen for osteoporosis. You may have this done starting at age 97. Mammogram. This may be done every 1-2 years. Talk to your health care provider about how often you should have regular mammograms. Talk with your health care provider about your test results, treatment options, and if necessary, the need for more tests. Vaccines  Your health care provider may recommend certain vaccines, such as: Influenza vaccine. This is recommended every year. Tetanus, diphtheria, and acellular pertussis (Tdap, Td) vaccine. You may need a Td booster every 10 years. Zoster vaccine. You may need this after age 62. Pneumococcal 13-valent conjugate (PCV13) vaccine. One dose is recommended after age 15. Pneumococcal polysaccharide (PPSV23) vaccine. One dose is recommended after age 34. Talk to your health care provider about which screenings and vaccines you need and how  often you need them. This information is  not intended to replace advice given to you by your health care provider. Make sure you discuss any questions you have with your health care provider. Document Released: 05/24/2015 Document Revised: 01/15/2016 Document Reviewed: 02/26/2015 Elsevier Interactive Patient Education  2017 [employer].  Fall Prevention in the Home Falls can cause injuries. They can happen to people of all ages. There are many things you can do to make your home safe and to help prevent falls. What can I do on the outside of my home? Regularly fix the edges of walkways and driveways and fix any cracks. Remove anything that might make you trip as you walk through a door, such as a raised step or threshold. Trim any bushes or trees on the path to your home. Use bright outdoor lighting. Clear any walking paths of anything that might make someone trip, such as rocks or tools. Regularly check to see if handrails are loose or broken. Make sure that both sides of any steps have handrails. Any raised decks and porches should have guardrails on the edges. Have any leaves, snow, or ice cleared regularly. Use sand or salt on walking paths during winter. Clean up any spills in your garage right away. This includes oil or grease spills. What can I do in the bathroom? Use night lights. Install grab bars by the toilet and in the tub and shower. Do not use towel bars as grab bars. Use non-skid mats or decals in the tub or shower. If you need to sit down in the shower, use a plastic, non-slip stool. Keep the floor dry. Clean up any water that spills on the floor as soon as it happens. Remove soap buildup in the tub or shower regularly. Attach bath mats securely with double-sided non-slip rug tape. Do not have throw rugs and other things on the floor that can make you trip. What can I do in the bedroom? Use night lights. Make sure that you have a light by your bed that is easy to reach. Do not use any sheets or blankets that  are too big for your bed. They should not hang down onto the floor. Have a firm chair that has side arms. You can use this for support while you get dressed. Do not have throw rugs and other things on the floor that can make you trip. What can I do in the kitchen? Clean up any spills right away. Avoid walking on wet floors. Keep items that you use a lot in easy-to-reach places. If you need to reach something above you, use a strong step stool that has a grab bar. Keep electrical cords out of the way. Do not use floor polish or wax that makes floors slippery. If you must use wax, use non-skid floor wax. Do not have throw rugs and other things on the floor that can make you trip. What can I do with my stairs? Do not leave any items on the stairs. Make sure that there are handrails on both sides of the stairs and use them. Fix handrails that are broken or loose. Make sure that handrails are as long as the stairways. Check any carpeting to make sure that it is firmly attached to the stairs. Fix any carpet that is loose or worn. Avoid having throw rugs at the top or bottom of the stairs. If you do have throw rugs, attach them to the floor with carpet tape. Make sure that you have a  light switch at the top of the stairs and the bottom of the stairs. If you do not have them, ask someone to add them for you. What else can I do to help prevent falls? Wear shoes that: Do not have high heels. Have rubber bottoms. Are comfortable and fit you well. Are closed at the toe. Do not wear sandals. If you use a stepladder: Make sure that it is fully opened. Do not climb a closed stepladder. Make sure that both sides of the stepladder are locked into place. Ask someone to hold it for you, if possible. Clearly mark and make sure that you can see: Any grab bars or handrails. First and last steps. Where the edge of each step is. Use tools that help you move around (mobility aids) if they are needed. These  include: Canes. Walkers. Scooters. Crutches. Turn on the lights when you go into a dark area. Replace any light bulbs as soon as they burn out. Set up your furniture so you have a clear path. Avoid moving your furniture around. If any of your floors are uneven, fix them. If there are any pets around you, be aware of where they are. Review your medicines with your doctor. Some medicines can make you feel dizzy. This can increase your chance of falling. Ask your doctor what other things that you can do to help prevent falls. This information is not intended to replace advice given to you by your health care provider. Make sure you discuss any questions you have with your health care provider. Document Released: 02/21/2009 Document Revised: 10/03/2015 Document Reviewed: 06/01/2014 Elsevier Interactive Patient Education  2017 [employer].

## 2022-10-14 DIAGNOSIS — Z940 Kidney transplant status: Secondary | ICD-10-CM | POA: Diagnosis not present

## 2022-10-15 ENCOUNTER — Telehealth: Payer: Self-pay | Admitting: Pharmacist

## 2022-10-15 NOTE — Progress Notes (Unsigned)
Care Management & Coordination Services Pharmacy Team   Reason for Encounter: Hypertension   Contacted patient to discuss hypertension disease state. Unsuccessful outreach. Left voicemail for patient to return call.    Current antihypertensive regimen:  furosemide (LASIX) 80 MG tablet  doxazosin (CARDURA) 2 MG tablet  DILT-XR 180 MG 24 hr capsule   Patient verbally confirms she is taking the above medications as directed. {yes/no:20286}  How often are you checking your Blood Pressure? {CHL HP BP Monitoring Frequency:[phone]}  she checks her blood pressure {timing:25218} {before/after:25217} taking her medication.  Current home BP readings: ***  DATE:             BP               PULSE   Wrist or arm cuff: Caffeine intake: Salt intake: OTC medications including pseudoephedrine or NSAIDs?  Any readings above 180/100? {yes/no:20286} If yes any symptoms of hypertensive emergency? {hypertensive emergency symptoms:25354}  What recent interventions/DTPs have been made by any provider to improve Blood Pressure control since last CPP Visit:  Patient reported  Any recent hospitalizations or ED visits since last visit with CPP? No  What diet changes have been made to improve Blood Pressure Control?  Patient reported  What exercise is being done to improve your Blood Pressure Control?  Patient reported  Adherence Review: Is the patient currently on ACE/ARB medication? No Does the patient have >5 day gap between last estimated fill dates? No  Star Rating Drugs:  Medication:   Last Fill: Day Supply  Rosuvastatin 20 MG tablet  09/23/22 30   Chart Updates: Recent office visits:  10/07/22 Annual Medicare Wellness Completed   Recent consult visits:  10/14/22 Howard Pouch, MD - Transplant - No changes noted. Follow up in 4 months.   Hospital visits:  None in previous 6 months  Medications: Outpatient Encounter Medications as of 10/15/2022  Medication Sig    acetaminophen (TYLENOL) 325 MG tablet Take 650 mg by mouth every 6 (six) hours as needed for moderate pain.   albuterol (VENTOLIN HFA) 108 (90 Base) MCG/ACT inhaler Inhale 2 puffs into the lungs every 6 (six) hours as needed for wheezing or shortness of breath.   allopurinol (ZYLOPRIM) 100 MG tablet TAKE 1 TABLET EVERY DAY   DILT-XR 180 MG 24 hr capsule Take 180 mg by mouth daily.   doxazosin (CARDURA) 2 MG tablet Take 2 mg by mouth 2 (two) times daily.   doxycycline (VIBRA-TABS) 100 MG tablet Take 1 tablet (100 mg total) by mouth 2 (two) times daily.   ELIQUIS 5 MG TABS tablet TAKE 1 TABLET(5 MG) BY MOUTH TWICE DAILY   furosemide (LASIX) 80 MG tablet Take 80 mg by mouth daily as needed (swelling).   hydrALAZINE (APRESOLINE) 50 MG tablet TAKE 1 TABLET THREE TIMES DAILY   Menthol, Topical Analgesic, (BIOFREEZE) 4 % GEL Apply 1 application. topically 3 (three) times daily as needed (pain).   metoprolol succinate (TOPROL-XL) 25 MG 24 hr tablet Take 1 tablet (25 mg total) by mouth daily. Take with or immediately following a meal.   mycophenolate (MYFORTIC) 180 MG EC tablet Take 360 mg by mouth 2 (two) times daily.   pantoprazole (PROTONIX) 40 MG tablet Take 40 mg by mouth daily.   prednisoLONE 5 MG TABS tablet Take by mouth.   rosuvastatin (CRESTOR) 20 MG tablet Take 1 tablet (20 mg total) by mouth daily.   sodium bicarbonate 650 MG tablet Take 1,300 mg by mouth 2 (two) times daily.  sulfamethoxazole-trimethoprim (BACTRIM) 400-80 MG tablet Take 1 tablet by mouth 3 (three) times a week.   tacrolimus (PROGRAF) 1 MG capsule Take 3 mg by mouth in the AM, take 2 mg by mouth in the PM   zinc sulfate 220 (50 Zn) MG capsule Take 1 capsule (220 mg total) by mouth daily. (Patient not taking: Reported on 09/02/2022)   No facility-administered encounter medications on file as of 10/15/2022.    Recent Office Vitals: BP Readings from Last 3 Encounters:  08/27/22 (!) 130/58  03/23/22 (!) 152/82  09/23/21 (!)  141/46   Pulse Readings from Last 3 Encounters:  08/27/22 90  03/23/22 83  09/02/21 70    Wt Readings from Last 3 Encounters:  08/27/22 207 lb (93.9 kg)  03/23/22 197 lb 9.6 oz (89.6 kg)  09/02/21 192 lb (87.1 kg)     Kidney Function Lab Results  Component Value Date/Time   CREATININE 4.50 (H) 09/02/2021 07:19 AM   CREATININE 5.70 (H) 07/10/2021 06:14 AM   CREATININE 2.41 (H) 09/13/2014 11:08 AM   CREATININE 2.06 (H) 09/12/2014 03:47 PM   GFRNONAA 11 (L) 05/23/2021 02:06 PM   GFRNONAA 21 (L) 09/13/2014 11:08 AM   GFRAA 22 (L) 02/01/2020 08:52 AM   GFRAA 24 (L) 09/13/2014 11:08 AM       Latest Ref Rng & Units 09/02/2021    7:19 AM 07/10/2021    6:14 AM 05/27/2021    7:58 AM  BMP  Glucose 70 - 99 mg/dL 99  409  811   BUN 8 - 23 mg/dL 48  46  57   Creatinine 0.44 - 1.00 mg/dL 9.14  7.82  9.56   Sodium 135 - 145 mmol/L 141  145  144   Potassium 3.5 - 5.1 mmol/L 3.9  4.4  4.6   Chloride 98 - 111 mmol/L 109  114  117     Future Appointments  Date Time Provider Department Center  10/28/2022  3:00 PM [facility] MM 3 [facility]  03/10/2023  3:00 PM Earlene Plater, Christian L, RPH [facility] None  04/27/2023 11:30 AM [facility] DX DEXA 1 [facility]     [employer] [employer]

## 2022-10-18 ENCOUNTER — Other Ambulatory Visit: Payer: Self-pay | Admitting: Cardiology

## 2022-10-18 DIAGNOSIS — I480 Paroxysmal atrial fibrillation: Secondary | ICD-10-CM

## 2022-10-19 NOTE — Telephone Encounter (Signed)
Eliquis 5mg  refill request received. Patient is 72 years old, weight-93.9kg, Crea-1.39 on 10/14/22 via Care Everywhere from [facility],  Diagnosis-Afib, and last seen by Dr. Anne Fu on 03/23/22. Dose is appropriate based on dosing criteria. Will send in refill to requested pharmacy.

## 2022-10-28 ENCOUNTER — Ambulatory Visit
Admission: RE | Admit: 2022-10-28 | Discharge: 2022-10-28 | Disposition: A | Discharge status: Home / Self Care | Payer: Medicare HMO | Source: Ambulatory Visit | Attending: Family Medicine | Admitting: Family Medicine

## 2022-10-28 DIAGNOSIS — Z1231 Encounter for screening mammogram for malignant neoplasm of breast: Secondary | ICD-10-CM | POA: Diagnosis not present

## 2022-11-18 DIAGNOSIS — Z4822 Encounter for aftercare following kidney transplant: Secondary | ICD-10-CM | POA: Diagnosis not present

## 2022-11-18 DIAGNOSIS — Z5321 Procedure and treatment not carried out due to patient leaving prior to being seen by health care provider: Secondary | ICD-10-CM | POA: Diagnosis not present

## 2022-11-18 DIAGNOSIS — Z940 Kidney transplant status: Secondary | ICD-10-CM | POA: Diagnosis not present

## 2022-11-18 DIAGNOSIS — Z79621 Long term (current) use of calcineurin inhibitor: Secondary | ICD-10-CM | POA: Diagnosis not present

## 2022-11-18 DIAGNOSIS — Z5181 Encounter for therapeutic drug level monitoring: Secondary | ICD-10-CM | POA: Diagnosis not present

## 2022-12-04 DIAGNOSIS — Z940 Kidney transplant status: Secondary | ICD-10-CM | POA: Diagnosis not present

## 2022-12-30 DIAGNOSIS — D849 Immunodeficiency, unspecified: Secondary | ICD-10-CM | POA: Diagnosis not present

## 2022-12-30 DIAGNOSIS — Z940 Kidney transplant status: Secondary | ICD-10-CM | POA: Diagnosis not present

## 2022-12-30 DIAGNOSIS — Z5181 Encounter for therapeutic drug level monitoring: Secondary | ICD-10-CM | POA: Diagnosis not present

## 2022-12-30 DIAGNOSIS — R5383 Other fatigue: Secondary | ICD-10-CM | POA: Diagnosis not present

## 2022-12-30 DIAGNOSIS — E139 Other specified diabetes mellitus without complications: Secondary | ICD-10-CM | POA: Diagnosis not present

## 2022-12-30 DIAGNOSIS — Z79621 Long term (current) use of calcineurin inhibitor: Secondary | ICD-10-CM | POA: Diagnosis not present

## 2022-12-30 DIAGNOSIS — Z4822 Encounter for aftercare following kidney transplant: Secondary | ICD-10-CM | POA: Diagnosis not present

## 2022-12-30 DIAGNOSIS — E1122 Type 2 diabetes mellitus with diabetic chronic kidney disease: Secondary | ICD-10-CM | POA: Diagnosis not present

## 2023-02-18 DIAGNOSIS — Z79899 Other long term (current) drug therapy: Secondary | ICD-10-CM | POA: Diagnosis not present

## 2023-02-18 DIAGNOSIS — E785 Hyperlipidemia, unspecified: Secondary | ICD-10-CM | POA: Diagnosis not present

## 2023-02-18 DIAGNOSIS — I129 Hypertensive chronic kidney disease with stage 1 through stage 4 chronic kidney disease, or unspecified chronic kidney disease: Secondary | ICD-10-CM | POA: Diagnosis not present

## 2023-02-18 DIAGNOSIS — D631 Anemia in chronic kidney disease: Secondary | ICD-10-CM | POA: Diagnosis not present

## 2023-02-18 DIAGNOSIS — I5189 Other ill-defined heart diseases: Secondary | ICD-10-CM | POA: Diagnosis not present

## 2023-02-18 DIAGNOSIS — M109 Gout, unspecified: Secondary | ICD-10-CM | POA: Diagnosis not present

## 2023-02-18 DIAGNOSIS — Z940 Kidney transplant status: Secondary | ICD-10-CM | POA: Diagnosis not present

## 2023-02-18 DIAGNOSIS — Z23 Encounter for immunization: Secondary | ICD-10-CM | POA: Diagnosis not present

## 2023-02-19 DIAGNOSIS — N186 End stage renal disease: Secondary | ICD-10-CM | POA: Diagnosis not present

## 2023-02-19 DIAGNOSIS — Z79899 Other long term (current) drug therapy: Secondary | ICD-10-CM | POA: Diagnosis not present

## 2023-03-10 ENCOUNTER — Encounter: Payer: Medicare HMO | Admitting: Pharmacist

## 2023-03-31 DIAGNOSIS — Z79899 Other long term (current) drug therapy: Secondary | ICD-10-CM | POA: Diagnosis not present

## 2023-03-31 DIAGNOSIS — Z940 Kidney transplant status: Secondary | ICD-10-CM | POA: Diagnosis not present

## 2023-04-27 ENCOUNTER — Ambulatory Visit
Admission: RE | Admit: 2023-04-27 | Discharge: 2023-04-27 | Disposition: A | Discharge status: Home / Self Care | Payer: Medicare HMO | Source: Ambulatory Visit | Attending: Family Medicine | Admitting: Family Medicine

## 2023-04-27 DIAGNOSIS — M8588 Other specified disorders of bone density and structure, other site: Secondary | ICD-10-CM | POA: Diagnosis not present

## 2023-04-27 DIAGNOSIS — E2839 Other primary ovarian failure: Secondary | ICD-10-CM | POA: Diagnosis not present

## 2023-04-27 DIAGNOSIS — N958 Other specified menopausal and perimenopausal disorders: Secondary | ICD-10-CM | POA: Diagnosis not present

## 2023-05-03 DIAGNOSIS — Z79899 Other long term (current) drug therapy: Secondary | ICD-10-CM | POA: Diagnosis not present

## 2023-05-03 DIAGNOSIS — Z940 Kidney transplant status: Secondary | ICD-10-CM | POA: Diagnosis not present

## 2023-05-25 DIAGNOSIS — I5189 Other ill-defined heart diseases: Secondary | ICD-10-CM | POA: Diagnosis not present

## 2023-05-25 DIAGNOSIS — M109 Gout, unspecified: Secondary | ICD-10-CM | POA: Diagnosis not present

## 2023-05-25 DIAGNOSIS — Z79899 Other long term (current) drug therapy: Secondary | ICD-10-CM | POA: Diagnosis not present

## 2023-05-25 DIAGNOSIS — E785 Hyperlipidemia, unspecified: Secondary | ICD-10-CM | POA: Diagnosis not present

## 2023-05-25 DIAGNOSIS — I129 Hypertensive chronic kidney disease with stage 1 through stage 4 chronic kidney disease, or unspecified chronic kidney disease: Secondary | ICD-10-CM | POA: Diagnosis not present

## 2023-05-25 DIAGNOSIS — Z940 Kidney transplant status: Secondary | ICD-10-CM | POA: Diagnosis not present

## 2023-05-25 DIAGNOSIS — N186 End stage renal disease: Secondary | ICD-10-CM | POA: Diagnosis not present

## 2023-05-25 DIAGNOSIS — J329 Chronic sinusitis, unspecified: Secondary | ICD-10-CM | POA: Diagnosis not present

## 2023-05-25 DIAGNOSIS — D631 Anemia in chronic kidney disease: Secondary | ICD-10-CM | POA: Diagnosis not present

## 2023-06-05 ENCOUNTER — Other Ambulatory Visit: Payer: Self-pay | Admitting: Cardiology

## 2023-06-05 DIAGNOSIS — I480 Paroxysmal atrial fibrillation: Secondary | ICD-10-CM

## 2023-06-07 NOTE — Telephone Encounter (Signed)
Prescription refill request for Eliquis received. Indication:svt Last office visit:needs ov Scr:1.36  8/24 Age: 73 Weight:93.9  kg  Prescription refilled

## 2023-07-23 DIAGNOSIS — Z940 Kidney transplant status: Secondary | ICD-10-CM | POA: Diagnosis not present

## 2023-07-23 DIAGNOSIS — R7302 Impaired glucose tolerance (oral): Secondary | ICD-10-CM | POA: Diagnosis not present

## 2023-08-05 ENCOUNTER — Other Ambulatory Visit: Payer: Self-pay | Admitting: Cardiology

## 2023-08-05 DIAGNOSIS — I480 Paroxysmal atrial fibrillation: Secondary | ICD-10-CM

## 2023-08-05 NOTE — Telephone Encounter (Signed)
 Prescription refill request for Eliquis received.  Last office visit: [place] 03/23/2022 Scr: 1.36, 12/30/2022 Age: 73 yo  Weight: 93.9 kg  Pt is  overdue to see cardiologist. Msg sent to schedulers.

## 2023-08-06 NOTE — Telephone Encounter (Signed)
 Pt has an appt on 11/29/23 with Dr. Anne Fu Age-19 yrs Wt-93.9kg Crea-1.36 12/30/22 via care Everywhere Will send in refill to requested pharmacy.

## 2023-08-25 DIAGNOSIS — Z940 Kidney transplant status: Secondary | ICD-10-CM | POA: Diagnosis not present

## 2023-08-25 DIAGNOSIS — E669 Obesity, unspecified: Secondary | ICD-10-CM | POA: Diagnosis not present

## 2023-08-25 DIAGNOSIS — M109 Gout, unspecified: Secondary | ICD-10-CM | POA: Diagnosis not present

## 2023-08-25 DIAGNOSIS — I5189 Other ill-defined heart diseases: Secondary | ICD-10-CM | POA: Diagnosis not present

## 2023-08-25 DIAGNOSIS — D631 Anemia in chronic kidney disease: Secondary | ICD-10-CM | POA: Diagnosis not present

## 2023-08-25 DIAGNOSIS — E785 Hyperlipidemia, unspecified: Secondary | ICD-10-CM | POA: Diagnosis not present

## 2023-08-25 DIAGNOSIS — J302 Other seasonal allergic rhinitis: Secondary | ICD-10-CM | POA: Diagnosis not present

## 2023-08-25 DIAGNOSIS — I129 Hypertensive chronic kidney disease with stage 1 through stage 4 chronic kidney disease, or unspecified chronic kidney disease: Secondary | ICD-10-CM | POA: Diagnosis not present

## 2023-10-22 ENCOUNTER — Other Ambulatory Visit: Payer: Self-pay

## 2023-10-22 ENCOUNTER — Emergency Department (HOSPITAL_COMMUNITY)

## 2023-10-22 ENCOUNTER — Observation Stay (HOSPITAL_COMMUNITY)

## 2023-10-22 ENCOUNTER — Encounter (HOSPITAL_COMMUNITY): Payer: Self-pay

## 2023-10-22 ENCOUNTER — Observation Stay (HOSPITAL_COMMUNITY)
Admission: EM | Admit: 2023-10-22 | Discharge: 2023-10-23 | Disposition: A | Discharge status: Home / Self Care | Attending: Family Medicine | Admitting: Family Medicine

## 2023-10-22 DIAGNOSIS — I132 Hypertensive heart and chronic kidney disease with heart failure and with stage 5 chronic kidney disease, or end stage renal disease: Secondary | ICD-10-CM | POA: Diagnosis not present

## 2023-10-22 DIAGNOSIS — I6782 Cerebral ischemia: Secondary | ICD-10-CM | POA: Diagnosis not present

## 2023-10-22 DIAGNOSIS — I480 Paroxysmal atrial fibrillation: Secondary | ICD-10-CM | POA: Diagnosis not present

## 2023-10-22 DIAGNOSIS — I509 Heart failure, unspecified: Secondary | ICD-10-CM | POA: Diagnosis not present

## 2023-10-22 DIAGNOSIS — N185 Chronic kidney disease, stage 5: Secondary | ICD-10-CM | POA: Insufficient documentation

## 2023-10-22 DIAGNOSIS — G8194 Hemiplegia, unspecified affecting left nondominant side: Secondary | ICD-10-CM | POA: Diagnosis not present

## 2023-10-22 DIAGNOSIS — I63423 Cerebral infarction due to embolism of bilateral anterior cerebral arteries: Secondary | ICD-10-CM | POA: Diagnosis not present

## 2023-10-22 DIAGNOSIS — I6389 Other cerebral infarction: Principal | ICD-10-CM | POA: Insufficient documentation

## 2023-10-22 DIAGNOSIS — I959 Hypotension, unspecified: Secondary | ICD-10-CM | POA: Diagnosis not present

## 2023-10-22 DIAGNOSIS — Z992 Dependence on renal dialysis: Secondary | ICD-10-CM | POA: Diagnosis not present

## 2023-10-22 DIAGNOSIS — I671 Cerebral aneurysm, nonruptured: Secondary | ICD-10-CM | POA: Diagnosis not present

## 2023-10-22 DIAGNOSIS — R531 Weakness: Secondary | ICD-10-CM | POA: Diagnosis not present

## 2023-10-22 DIAGNOSIS — Z7901 Long term (current) use of anticoagulants: Secondary | ICD-10-CM | POA: Diagnosis not present

## 2023-10-22 DIAGNOSIS — Z79624 Long term (current) use of inhibitors of nucleotide synthesis: Secondary | ICD-10-CM | POA: Diagnosis not present

## 2023-10-22 DIAGNOSIS — M109 Gout, unspecified: Secondary | ICD-10-CM | POA: Insufficient documentation

## 2023-10-22 DIAGNOSIS — M160 Bilateral primary osteoarthritis of hip: Secondary | ICD-10-CM | POA: Diagnosis not present

## 2023-10-22 DIAGNOSIS — I491 Atrial premature depolarization: Secondary | ICD-10-CM | POA: Diagnosis not present

## 2023-10-22 DIAGNOSIS — M47816 Spondylosis without myelopathy or radiculopathy, lumbar region: Secondary | ICD-10-CM | POA: Diagnosis not present

## 2023-10-22 DIAGNOSIS — Z79899 Other long term (current) drug therapy: Secondary | ICD-10-CM | POA: Insufficient documentation

## 2023-10-22 DIAGNOSIS — Z940 Kidney transplant status: Secondary | ICD-10-CM | POA: Diagnosis not present

## 2023-10-22 DIAGNOSIS — R200 Anesthesia of skin: Secondary | ICD-10-CM | POA: Diagnosis not present

## 2023-10-22 DIAGNOSIS — I10 Essential (primary) hypertension: Secondary | ICD-10-CM | POA: Diagnosis not present

## 2023-10-22 DIAGNOSIS — I2510 Atherosclerotic heart disease of native coronary artery without angina pectoris: Secondary | ICD-10-CM | POA: Diagnosis not present

## 2023-10-22 DIAGNOSIS — E785 Hyperlipidemia, unspecified: Secondary | ICD-10-CM | POA: Insufficient documentation

## 2023-10-22 DIAGNOSIS — I5032 Chronic diastolic (congestive) heart failure: Secondary | ICD-10-CM | POA: Diagnosis not present

## 2023-10-22 DIAGNOSIS — I639 Cerebral infarction, unspecified: Secondary | ICD-10-CM | POA: Diagnosis present

## 2023-10-22 DIAGNOSIS — R202 Paresthesia of skin: Secondary | ICD-10-CM | POA: Diagnosis not present

## 2023-10-22 DIAGNOSIS — N186 End stage renal disease: Secondary | ICD-10-CM | POA: Insufficient documentation

## 2023-10-22 DIAGNOSIS — I4891 Unspecified atrial fibrillation: Secondary | ICD-10-CM | POA: Diagnosis not present

## 2023-10-22 DIAGNOSIS — E1122 Type 2 diabetes mellitus with diabetic chronic kidney disease: Secondary | ICD-10-CM | POA: Diagnosis not present

## 2023-10-22 LAB — BASIC METABOLIC PANEL WITH GFR
Anion gap: 11 (ref 5–15)
BUN: 19 mg/dL (ref 8–23)
CO2: 21 mmol/L — ABNORMAL LOW (ref 22–32)
Calcium: 9.3 mg/dL (ref 8.9–10.3)
Chloride: 109 mmol/L (ref 98–111)
Creatinine, Ser: 1.27 mg/dL — ABNORMAL HIGH (ref 0.44–1.00)
GFR, Estimated: 45 mL/min — ABNORMAL LOW (ref >60)
Glucose, Bld: 127 mg/dL — ABNORMAL HIGH (ref 70–99)
Potassium: 4.2 mmol/L (ref 3.5–5.1)
Sodium: 141 mmol/L (ref 135–145)

## 2023-10-22 LAB — CBC
HCT: 43.4 % (ref 36.0–46.0)
Hemoglobin: 13.1 g/dL (ref 12.0–15.0)
MCH: 27.1 pg (ref 26.0–34.0)
MCHC: 30.2 g/dL (ref 30.0–36.0)
MCV: 89.9 fL (ref 80.0–100.0)
Platelets: 202 10*3/uL (ref 150–400)
RBC: 4.83 MIL/uL (ref 3.87–5.11)
RDW: 15.6 % — ABNORMAL HIGH (ref 11.5–15.5)
WBC: 8.7 10*3/uL (ref 4.0–10.5)
nRBC: 0 % (ref 0.0–0.2)

## 2023-10-22 LAB — TSH: TSH: 1.496 u[IU]/mL (ref 0.350–4.500)

## 2023-10-22 LAB — HEMOGLOBIN A1C
Hgb A1c MFr Bld: 6.3 % — ABNORMAL HIGH (ref 4.8–5.6)
Mean Plasma Glucose: 134.11 mg/dL

## 2023-10-22 LAB — LIPID PANEL
Cholesterol: 154 mg/dL (ref 0–200)
HDL: 56 mg/dL (ref >40)
LDL Cholesterol: 77 mg/dL (ref 0–99)
Total CHOL/HDL Ratio: 2.8 ratio
Triglycerides: 103 mg/dL (ref <150)
VLDL: 21 mg/dL (ref 0–40)

## 2023-10-22 MED ORDER — SODIUM BICARBONATE 650 MG PO TABS
1300.0000 mg | ORAL_TABLET | Freq: Two times a day (BID) | ORAL | Status: DC
  Administered 2023-10-23: 1300 mg via ORAL
  Filled 2023-10-22 (×2): qty 2

## 2023-10-22 MED ORDER — MYCOPHENOLATE SODIUM 180 MG PO TBEC
360.0000 mg | DELAYED_RELEASE_TABLET | Freq: Two times a day (BID) | ORAL | Status: DC
  Administered 2023-10-23: 360 mg via ORAL
  Filled 2023-10-22 (×3): qty 2

## 2023-10-22 MED ORDER — METOPROLOL SUCCINATE ER 25 MG PO TB24: 25.0000 mg | ORAL_TABLET | Freq: Every day | ORAL | Status: DC

## 2023-10-22 MED ORDER — ONDANSETRON HCL 4 MG/2ML IJ SOLN: 4.0000 mg | Freq: Four times a day (QID) | INTRAMUSCULAR | Status: DC | PRN

## 2023-10-22 MED ORDER — SENNOSIDES-DOCUSATE SODIUM 8.6-50 MG PO TABS: 1.0000 | ORAL_TABLET | Freq: Every evening | ORAL | Status: DC | PRN

## 2023-10-22 MED ORDER — HYDROMORPHONE HCL 2 MG PO TABS
2.0000 mg | ORAL_TABLET | Freq: Four times a day (QID) | ORAL | Status: DC | PRN
  Administered 2023-10-22: 2 mg via ORAL
  Filled 2023-10-22: qty 1

## 2023-10-22 MED ORDER — TACROLIMUS 1 MG PO CAPS
2.0000 mg | ORAL_CAPSULE | Freq: Every day | ORAL | Status: DC
  Administered 2023-10-22: 2 mg via ORAL
  Filled 2023-10-22: qty 2

## 2023-10-22 MED ORDER — ALBUTEROL SULFATE (2.5 MG/3ML) 0.083% IN NEBU: 2.5000 mg | INHALATION_SOLUTION | Freq: Four times a day (QID) | RESPIRATORY_TRACT | Status: DC | PRN

## 2023-10-22 MED ORDER — ASPIRIN 300 MG RE SUPP: 300.0000 mg | Freq: Every day | RECTAL | Status: DC

## 2023-10-22 MED ORDER — ASPIRIN 325 MG PO TABS: 325.0000 mg | ORAL_TABLET | Freq: Every day | ORAL | Status: DC

## 2023-10-22 MED ORDER — ROSUVASTATIN CALCIUM 20 MG PO TABS: 20.0000 mg | ORAL_TABLET | Freq: Every day | ORAL | Status: DC

## 2023-10-22 MED ORDER — HYDRALAZINE HCL 25 MG PO TABS: 50.0000 mg | ORAL_TABLET | Freq: Three times a day (TID) | ORAL | Status: DC | PRN

## 2023-10-22 MED ORDER — ORAL CARE MOUTH RINSE: 15.0000 mL | OROMUCOSAL | Status: DC | PRN

## 2023-10-22 MED ORDER — ALLOPURINOL 100 MG PO TABS
100.0000 mg | ORAL_TABLET | Freq: Every day | ORAL | Status: DC
  Administered 2023-10-23: 100 mg via ORAL
  Filled 2023-10-22: qty 1

## 2023-10-22 MED ORDER — ACETAMINOPHEN 160 MG/5ML PO SOLN: 650.0000 mg | ORAL | Status: DC | PRN

## 2023-10-22 MED ORDER — TACROLIMUS 1 MG PO CAPS
3.0000 mg | ORAL_CAPSULE | Freq: Every day | ORAL | Status: DC
  Administered 2023-10-23: 3 mg via ORAL
  Filled 2023-10-22: qty 3

## 2023-10-22 MED ORDER — APIXABAN 5 MG PO TABS
5.0000 mg | ORAL_TABLET | Freq: Two times a day (BID) | ORAL | Status: DC
  Administered 2023-10-22 – 2023-10-23 (×2): 5 mg via ORAL
  Filled 2023-10-22 (×2): qty 2

## 2023-10-22 MED ORDER — SULFAMETHOXAZOLE-TRIMETHOPRIM 400-80 MG PO TABS: 1.0000 | ORAL_TABLET | ORAL | Status: DC

## 2023-10-22 MED ORDER — ACETAMINOPHEN 650 MG RE SUPP: 650.0000 mg | RECTAL | Status: DC | PRN

## 2023-10-22 MED ORDER — PREDNISONE 5 MG PO TABS
5.0000 mg | ORAL_TABLET | Freq: Every day | ORAL | Status: DC
  Administered 2023-10-23: 5 mg via ORAL
  Filled 2023-10-22: qty 1

## 2023-10-22 MED ORDER — STROKE: EARLY STAGES OF RECOVERY BOOK
Freq: Once | Status: AC
  Filled 2023-10-22: qty 1

## 2023-10-22 MED ORDER — PANTOPRAZOLE SODIUM 40 MG PO TBEC
40.0000 mg | DELAYED_RELEASE_TABLET | Freq: Every day | ORAL | Status: DC
  Administered 2023-10-23: 40 mg via ORAL
  Filled 2023-10-22: qty 1

## 2023-10-22 MED ORDER — ACETAMINOPHEN 325 MG PO TABS: 650.0000 mg | ORAL_TABLET | ORAL | Status: DC | PRN

## 2023-10-22 MED ORDER — DILTIAZEM HCL ER COATED BEADS 180 MG PO CP24
180.0000 mg | ORAL_CAPSULE | Freq: Every day | ORAL | Status: DC
  Administered 2023-10-23: 180 mg via ORAL
  Filled 2023-10-22: qty 1

## 2023-10-22 NOTE — ED Notes (Signed)
 Patient transported to MRI

## 2023-10-22 NOTE — ED Notes (Signed)
 Patient transported to X-ray

## 2023-10-22 NOTE — ED Notes (Signed)
 CCMD called.

## 2023-10-22 NOTE — ED Triage Notes (Signed)
 Pt BIB [facility] from home. Approx 0630 pt walking dog, 0700 pt noticed left leg numbness and weakness. Hx HTN, CHF and kidney failure (dialysis patient, received transplant).   Fistula left arm 18 RAC 135 BG

## 2023-10-22 NOTE — ED Provider Notes (Signed)
 Signout from Dr. Elva Hamburger.  73 year old female here with left leg greater than left arm weakness.  Awoke with symptoms.  Has some ongoing left hip pain.  No trauma.  She is pending MRI brain to evaluate for possible stroke.  Plan is for follow-up on MRI results and if negative patient will need ambulation trial. Physical Exam  BP (!) 167/64   Pulse (!) 101   Temp 98.5 F (36.9 C) (Oral)   Resp 17   Ht 5' 5 (1.651 m)   Wt 97.5 kg   SpO2 100%   BMI 35.78 kg/m   Physical Exam  Procedures  Procedures  ED Course / MDM    Medical Decision Making Amount and/or Complexity of Data Reviewed Labs: ordered. Radiology: ordered.  Risk Decision regarding hospitalization.   MRI coming back with multiple strokes.  Discussed with neurology Dr. Lindzen who will see in consult.  5:30 PM.  I updated the patient on the results of her MRI.  Have placed patient for hospitalist for admission.  5:45 PM.  Discussed with [facility] hospitalist Dr. Hubert Madden who will see for admission.     Brittany Fredrickson, MD 10/23/23 1009

## 2023-10-22 NOTE — ED Provider Notes (Signed)
 [place] EMERGENCY DEPARTMENT AT [facility] Provider Note   CSN: [id number] Arrival date & time: 10/22/23  1211     Patient presents with: Extremity Weakness   Brittany Charles is a 73 y.o. female.   73 year old female with past medical history of renal transplant and hypertension presenting to the emergency department today with left-sided weakness.  The patient states that she started to develop some left-sided weakness when she was walking her dog this morning around 6:30 AM.  Denies any associated headache.  She denies any difficulty speaking with this.  She thought that this would pass if she waited and when her symptoms did not improve she came to the ER for further evaluation.  The patient denies any associated chest pain.  States that she has been having some pain in her left hip that has been ongoing now for the past few days.  Denies any recent injuries or fevers.  She denies any neck or back pain.  She came to the emergency department today for further evaluation regarding this.   Extremity Weakness       Prior to Admission medications   Medication Sig Start Date End Date Taking? Authorizing Provider  acetaminophen  (TYLENOL ) 325 MG tablet Take 650 mg by mouth every 6 (six) hours as needed for moderate pain.    [provider]  albuterol  (VENTOLIN  HFA) 108 (90 Base) MCG/ACT inhaler Inhale 2 puffs into the lungs every 6 (six) hours as needed for wheezing or shortness of breath. 01/04/20   Rai, Ripudeep K, MD  allopurinol  (ZYLOPRIM ) 100 MG tablet TAKE 1 TABLET EVERY DAY 10/10/15   Arloa Lamas, MD  apixaban  (ELIQUIS ) 5 MG TABS tablet TAKE 1 TABLET(5 MG) BY MOUTH TWICE DAILY 08/06/23   Hugh Madura, MD  DILT-XR 180 MG 24 hr capsule Take 180 mg by mouth daily. 09/27/20   [provider]  doxazosin (CARDURA) 2 MG tablet Take 2 mg by mouth 2 (two) times daily. 11/06/20   [provider]  doxycycline  (VIBRA -TABS) 100 MG tablet Take 1 tablet  (100 mg total) by mouth 2 (two) times daily. 08/27/22   Jess Morita, MD  furosemide  (LASIX ) 80 MG tablet Take 80 mg by mouth daily as needed (swelling).    [provider]  hydrALAZINE  (APRESOLINE ) 50 MG tablet TAKE 1 TABLET THREE TIMES DAILY 10/10/15   Arloa Lamas, MD  Menthol, Topical Analgesic, (BIOFREEZE) 4 % GEL Apply 1 application. topically 3 (three) times daily as needed (pain).    [provider]  metoprolol  succinate (TOPROL -XL) 25 MG 24 hr tablet Take 1 tablet (25 mg total) by mouth daily. Take with or immediately following a meal. 03/07/21   Lesa Rape, MD  mycophenolate (MYFORTIC) 180 MG EC tablet Take 360 mg by mouth 2 (two) times daily.    [provider]  pantoprazole  (PROTONIX ) 40 MG tablet Take 40 mg by mouth daily.    [provider]  prednisoLONE 5 MG TABS tablet Take by mouth.    [provider]  rosuvastatin  (CRESTOR ) 20 MG tablet Take 1 tablet (20 mg total) by mouth daily. 12/10/20   Flo Hummingbird, PA-C  sodium bicarbonate  650 MG tablet Take 1,300 mg by mouth 2 (two) times daily. 02/18/21   [provider]  sulfamethoxazole-trimethoprim (BACTRIM) 400-80 MG tablet Take 1 tablet by mouth 3 (three) times a week.    [provider]  tacrolimus  (PROGRAF ) 1 MG capsule Take 3 mg by mouth  in the AM, take 2 mg by mouth in the PM    [provider]  zinc  sulfate 220 (50 Zn) MG capsule Take 1 capsule (220 mg total) by mouth daily. Patient not taking: Reported on 09/02/2022 01/05/20   Loma Rising, MD    Allergies: Zithromax  [azithromycin ]    Review of Systems  Musculoskeletal:  Positive for extremity weakness.  Neurological:  Positive for weakness and numbness.  All other systems reviewed and are negative.   Updated Vital Signs BP (!) 167/64   Pulse (!) 101   Temp 98.5 F (36.9 C) (Oral)   Resp 17   Ht 5' 5 (1.651 m)   Wt 97.5 kg   SpO2 100%   BMI 35.78 kg/m   Physical Exam Vitals and  nursing note reviewed.   Gen: NAD Eyes: PERRL, EOMI HEENT: no oropharyngeal swelling Neck: trachea midline Resp: clear to auscultation bilaterally Card: RRR, no murmurs, rubs, or gallops Abd: nontender, nondistended Extremities: no calf tenderness, no edema Vascular: 2+ radial pulses bilaterally, 2+ DP pulses bilaterally Neuro: NIH stroke scale of 2 for left lower extremity drift.  The patient does not have any pronator drift in the left upper extremity but does have 4 out of 5 strength in the left upper extremity compared to the right. Skin: no rashes Psyc: acting appropriately   (all labs ordered are listed, but only abnormal results are displayed) Labs Reviewed  BASIC METABOLIC PANEL WITH GFR - Abnormal; Notable for the following components:      Result Value   CO2 21 (*)    Glucose, Bld 127 (*)    Creatinine, Ser 1.27 (*)    GFR, Estimated 45 (*)    All other components within normal limits  CBC - Abnormal; Notable for the following components:   RDW 15.6 (*)    All other components within normal limits    EKG: EKG Interpretation Date/Time:  Friday October 22 2023 12:14:13 EDT Ventricular Rate:  79 PR Interval:  150 QRS Duration:  98 QT Interval:  407 QTC Calculation: 467 R Axis:   -62  Text Interpretation: Sinus rhythm Paired ventricular premature complexes Probable left atrial enlargement Left anterior fascicular block Nonspecific ST abnormality No STEMI Confirmed by Abner Hoffman [phone]) on 10/22/2023 12:21:20 PM  Radiology: Lenell Query Hip Unilat W or Wo Pelvis 2-3 Views Left Result Date: 10/22/2023 CLINICAL DATA:  Left leg numbness and weakness. EXAM: DG HIP (WITH OR WITHOUT PELVIS) 2-3V LEFT COMPARISON:  CT abdomen/pelvis dated 03/04/2021. FINDINGS: No acute fracture or dislocation. Femoral heads are seated within the acetabula. Mild osteoarthritis of the bilateral hips. Sacroiliac joints are anatomically aligned with degenerative changes. Pubic symphysis is anatomically  aligned. Degenerative changes of the visualized lower lumbar spine. IMPRESSION: 1. No acute osseous abnormality. 2. Mild osteoarthritis of the bilateral hips. Electronically Signed   By: Mannie Seek M.D.   On: 10/22/2023 14:31     Procedures   Medications Ordered in the ED - No data to display                                  Medical Decision Making 73 year old female with past medical history of renal transplant and hypertension presenting to the emergency department today with left-sided weakness.  The patient symptoms began at 6:30 AM.  She is on Eliquis  so she is not a candidate for TNK at this time.  Her exam is not  consistent with large vessel occlusion and given her renal dysfunction CT angiogram would not be warranted after weighing risk versus benefits.  I will further evaluate the patient here with an MRI for further evaluation for CVA.  She has been having some left hip pain.  Will obtain x-ray here as this may be contributing to the lower extremity weakness but would not explain her upper extremity symptoms.  I will reevaluate for ultimate disposition.  The patient's labs are largely unremarkable.  MRI pending at the time of signout.  Amount and/or Complexity of Data Reviewed Labs: ordered. Radiology: ordered.        Final diagnoses:  Left-sided weakness    ED Discharge Orders     None          Carin Charleston, MD 10/22/23 1626

## 2023-10-22 NOTE — H&P (Signed)
 [facility] History and Physical   Patient: Brittany Charles:[id number]   PCP: Benjiman Bras, MD DOB: September 05, 1950   DOA: 10/22/2023   DOS: 10/22/2023   DOS: the patient was seen and examined on 10/22/2023  Patient coming from: The patient is coming from Home  Chief Complaint: Left lower extremity weakness started 6:30 AM  HPI: Brittany Charles is a 73 y.o. female with Past medical history of CAD, HTN, chronic systolic CHF LVEF 45 to 50%, paroxysmal A-fib/SVTs on DOAC, gout, lupus, ESRD history of HD, s/p renal transplant 11/21/2021, presented at [facility] ED with complaining of left lower extremity weakness.  As per patient she was walking her dog and noticed left-sided weakness.  Patient tolerated well go away but it did not improve so she called EMS and was brought in to the ED.  Patient denied any other neurological symptoms.  No prior history of CVA.  Workup revealed acute CVA.  Neurology was consulted.  TRH consulted for admission and further management.   ED Course: VS afebrile, HR 82, RR 15, BP 154/68, 100% on RA  BMP CO 21, BG 127, creatinine 1.27, EGFR 45 rest within normal range CBC within normal range MRI brain: 1. Two small acute infarcts within the right cingulate gyrus (measuring up to 6 mm). 2. 2 mm acute infarct within mid-to-posterior left frontal lobe white matter. 3. Moderate background chronic small vessel ischemic changes within the cerebral white matter and pons.    Review of Systems: as mentioned in the history of present illness.  All other systems reviewed and are negative.  Past Medical History:  Diagnosis Date   Anemia    Arthritis    CHF (congestive heart failure) (HCC)    Colon polyps    COVID 2020   hospitalized at [facility]   Fibroids    GERD (gastroesophageal reflux disease)    Hypertension    Lupus (systemic lupus erythematosus) (HCC)    Myocardial infarction (HCC)    Pneumonia    Renal insufficiency    CKD stage 5- not on dialysis  (07/08/21)   Past Surgical History:  Procedure Laterality Date   ABDOMINAL HYSTERECTOMY  10/27/2000   TAH, BSO   AV FISTULA PLACEMENT Left 05/27/2021   Procedure: LEFT BRACHIOCEPHALIC ARTERIOVENOUS (AV) FISTULA CREATION;  Surgeon: Kayla Part, MD;  Location: [facility] OR;  Service: Vascular;  Laterality: Left;  with peripheral nerve block   BIOPSY  09/02/2021   Procedure: BIOPSY;  Surgeon: Sergio Dandy, MD;  Location: [facility] ENDOSCOPY;  Service: Endoscopy;;   COLONOSCOPY WITH PROPOFOL  N/A 09/02/2021   Procedure: COLONOSCOPY WITH PROPOFOL ;  Surgeon: Sergio Dandy, MD;  Location: [facility] ENDOSCOPY;  Service: Endoscopy;  Laterality: N/A;   LEFT AND RIGHT HEART CATHETERIZATION WITH CORONARY ANGIOGRAM Right 04/21/2011   Procedure: LEFT AND RIGHT HEART CATHETERIZATION WITH CORONARY ANGIOGRAM;  Surgeon: Arnoldo Lapping, MD;  Location: [facility] CATH LAB;  Service: Cardiovascular;  Laterality: Right;   LIGATION OF ARTERIOVENOUS  FISTULA Left 07/10/2021   Procedure: LEFT ARM BRANCH LIGATION OF ARTERIOVENOUS  FISTULA;  Surgeon: Kayla Part, MD;  Location: [facility] OR;  Service: Vascular;  Laterality: Left;  PERIPHERAL NERVE BLOCK   POLYPECTOMY  09/02/2021   Procedure: POLYPECTOMY;  Surgeon: Sergio Dandy, MD;  Location: [facility] ENDOSCOPY;  Service: Endoscopy;;   TONSILLECTOMY     Social History:  reports that she has never smoked. She has never used smokeless tobacco. She reports that she does not drink alcohol and does not  use drugs.  Allergies  Allergen Reactions   Zithromax  [Azithromycin ] Nausea And Vomiting    Family history reviewed and not pertinent Family History  Problem Relation Age of Onset   Hypertension Mother    Heart disease Mother        CHF   Stroke Mother    Diabetes Mother    Hyperlipidemia Father    Stroke Father    Hypertension Father    Asthma Brother    Kidney disease Brother    Stroke Maternal Grandmother    Colon cancer Other        several cousins   Breast cancer Other     Colon cancer Son    Hypertension Son      Prior to Admission medications   Medication Sig Start Date End Date Taking? Authorizing Provider  acetaminophen  (TYLENOL ) 325 MG tablet Take 650 mg by mouth every 6 (six) hours as needed for moderate pain.    [provider]  albuterol  (VENTOLIN  HFA) 108 (90 Base) MCG/ACT inhaler Inhale 2 puffs into the lungs every 6 (six) hours as needed for wheezing or shortness of breath. 01/04/20   Rai, Ripudeep K, MD  allopurinol  (ZYLOPRIM ) 100 MG tablet TAKE 1 TABLET EVERY DAY 10/10/15   Arloa Lamas, MD  apixaban  (ELIQUIS ) 5 MG TABS tablet TAKE 1 TABLET(5 MG) BY MOUTH TWICE DAILY 08/06/23   Hugh Madura, MD  DILT-XR 180 MG 24 hr capsule Take 180 mg by mouth daily. 09/27/20   [provider]  doxazosin (CARDURA) 2 MG tablet Take 2 mg by mouth 2 (two) times daily. 11/06/20   [provider]  doxycycline  (VIBRA -TABS) 100 MG tablet Take 1 tablet (100 mg total) by mouth 2 (two) times daily. 08/27/22   Jess Morita, MD  furosemide  (LASIX ) 80 MG tablet Take 80 mg by mouth daily as needed (swelling).    [provider]  hydrALAZINE  (APRESOLINE ) 50 MG tablet TAKE 1 TABLET THREE TIMES DAILY 10/10/15   Arloa Lamas, MD  Menthol, Topical Analgesic, (BIOFREEZE) 4 % GEL Apply 1 application. topically 3 (three) times daily as needed (pain).    [provider]  metoprolol  succinate (TOPROL -XL) 25 MG 24 hr tablet Take 1 tablet (25 mg total) by mouth daily. Take with or immediately following a meal. 03/07/21   Lesa Rape, MD  mycophenolate (MYFORTIC) 180 MG EC tablet Take 360 mg by mouth 2 (two) times daily.    [provider]  pantoprazole  (PROTONIX ) 40 MG tablet Take 40 mg by mouth daily.    [provider]  prednisoLONE 5 MG TABS tablet Take by mouth.    [provider]  rosuvastatin  (CRESTOR ) 20 MG tablet Take 1 tablet (20 mg total) by mouth daily. 12/10/20   Flo Hummingbird, PA-C  sodium bicarbonate   650 MG tablet Take 1,300 mg by mouth 2 (two) times daily. 02/18/21   [provider]  sulfamethoxazole-trimethoprim (BACTRIM) 400-80 MG tablet Take 1 tablet by mouth 3 (three) times a week.    [provider]  tacrolimus  (PROGRAF ) 1 MG capsule Take 3 mg by mouth in the AM, take 2 mg by mouth in the PM    [provider]  zinc  sulfate 220 (50 Zn) MG capsule Take 1 capsule (220 mg total) by mouth daily. Patient not taking: Reported on 09/02/2022 01/05/20   Loma Rising, MD    Physical Exam: Vitals:   10/22/23 1648 10/22/23 1651 10/22/23 1651 10/22/23 1730  BP: Aaron Aas)  169/98   (!) 193/79  Pulse: 72 72  75  Resp: 17 14 17  (!) 21  Temp: 98.4 F (36.9 C)  98.4 F (36.9 C)   TempSrc: Oral  Oral   SpO2: 100% 100%  100%  Weight:      Height:        General: alert and oriented to time, place, and person. Appear in mild distress, affect appropriate Eyes: PERRLA, Conjunctiva normal ENT: Oral Mucosa Clear, moist  Neck: no JVD, no Abnormal Mass Or lumps Cardiovascular: S1 and S2 Present, no Murmur, peripheral pulses symmetrical Respiratory: good respiratory effort, Bilateral Air entry equal and Decreased, no signs of accessory muscle use, Clear to Auscultation, no Crackles, no wheezes Abdomen: Bowel Sound present, Soft and no tenderness, no hernia Skin: no rashes  Extremities: no Pedal edema, no calf tenderness Neurologic: LLE weakness power 4/5, no any other focal deficits. Gait not checked due to patient safety concerns  Data Reviewed: I have personally reviewed and interpreted labs, imaging as discussed below.  CBC: Recent Labs  Lab 10/22/23 1242  WBC 8.7  HGB 13.1  HCT 43.4  MCV 89.9  PLT 202   Basic Metabolic Panel: Recent Labs  Lab 10/22/23 1242  NA 141  K 4.2  CL 109  CO2 21*  GLUCOSE 127*  BUN 19  CREATININE 1.27*  CALCIUM  9.3   GFR: Estimated Creatinine Clearance: 47 mL/min (A) (by C-G formula based on SCr of 1.27 mg/dL (H)). Liver  Function Tests: No results for input(s): AST, ALT, ALKPHOS, BILITOT, PROT, ALBUMIN  in the last 168 hours. No results for input(s): LIPASE, AMYLASE in the last 168 hours. No results for input(s): AMMONIA in the last 168 hours. Coagulation Profile: No results for input(s): INR, PROTIME in the last 168 hours. Cardiac Enzymes: No results for input(s): CKTOTAL, CKMB, CKMBINDEX, TROPONINI in the last 168 hours. BNP (last 3 results) No results for input(s): PROBNP in the last 8760 hours. HbA1C: No results for input(s): HGBA1C in the last 72 hours. CBG: No results for input(s): GLUCAP in the last 168 hours. Lipid Profile: No results for input(s): CHOL, HDL, LDLCALC, TRIG, CHOLHDL, LDLDIRECT in the last 72 hours. Thyroid  Function Tests: No results for input(s): TSH, T4TOTAL, FREET4, T3FREE, THYROIDAB in the last 72 hours. Anemia Panel: No results for input(s): VITAMINB12, FOLATE, FERRITIN, TIBC, IRON, RETICCTPCT in the last 72 hours. Urine analysis:    Component Value Date/Time   COLORURINE YELLOW 03/03/2021 2149   APPEARANCEUR HAZY (A) 03/03/2021 2149   LABSPEC 1.012 03/03/2021 2149   PHURINE 6.0 03/03/2021 2149   GLUCOSEU NEGATIVE 03/03/2021 2149   HGBUR NEGATIVE 03/03/2021 2149   BILIRUBINUR NEGATIVE 03/03/2021 2149   BILIRUBINUR neg 09/12/2014 1612   KETONESUR NEGATIVE 03/03/2021 2149   PROTEINUR >=300 (A) 03/03/2021 2149   UROBILINOGEN 0.2 09/12/2014 1612   UROBILINOGEN 0.2 10/01/2012 1845   NITRITE NEGATIVE 03/03/2021 2149   LEUKOCYTESUR NEGATIVE 03/03/2021 2149    Radiological Exams on Admission: MR BRAIN WO CONTRAST Result Date: 10/22/2023 CLINICAL DATA:  Provided history: Neuro deficit, acute, stroke suspected. EXAM: MRI HEAD WITHOUT CONTRAST TECHNIQUE: Multiplanar, multiecho pulse sequences of the brain and surrounding structures were obtained without intravenous contrast. COMPARISON:  Head CT 09/19/2014.  FINDINGS: Brain: No age-advanced or lobar predominant cerebral atrophy. 2 mm acute infarct within the mid-to-posterior left frontal lobe white matter (centrum semiovale) (series 2, image 36). Two acute infarcts within the right cingulate gyrus measuring up to 6 mm (series 2, image 35) (series 4, image  18). Multifocal T2 FLAIR hyperintense signal abnormality within the cerebral white matter and pons, nonspecific but compatible with moderate chronic small vessel ischemic disease. Punctate chronic microhemorrhage within the left occipital lobe. No evidence of an intracranial mass. No extra-axial fluid collection. No midline shift. Vascular: Maintained flow voids within the proximal large arterial vessels. Skull and upper cervical spine: No focal worrisome marrow lesion. Incompletely assessed cervical spondylosis. Sinuses/Orbits: No mass or acute finding within the imaged orbits. No significant paranasal sinus disease. IMPRESSION: 1. Two small acute infarcts within the right cingulate gyrus (measuring up to 6 mm). 2. 2 mm acute infarct within mid-to-posterior left frontal lobe white matter. 3. Moderate background chronic small vessel ischemic changes within the cerebral white matter and pons. Electronically Signed   By: Bascom Lily D.O.   On: 10/22/2023 16:57   DG Hip Unilat W or Wo Pelvis 2-3 Views Left Result Date: 10/22/2023 CLINICAL DATA:  Left leg numbness and weakness. EXAM: DG HIP (WITH OR WITHOUT PELVIS) 2-3V LEFT COMPARISON:  CT abdomen/pelvis dated 03/04/2021. FINDINGS: No acute fracture or dislocation. Femoral heads are seated within the acetabula. Mild osteoarthritis of the bilateral hips. Sacroiliac joints are anatomically aligned with degenerative changes. Pubic symphysis is anatomically aligned. Degenerative changes of the visualized lower lumbar spine. IMPRESSION: 1. No acute osseous abnormality. 2. Mild osteoarthritis of the bilateral hips. Electronically Signed   By: Mannie Seek M.D.   On:  10/22/2023 14:31   EKG: Independently reviewed.  Sinus rhythm with PVCs  Echocardiogram: Pending, order placed  I reviewed all nursing notes, pharmacy notes, vitals, pertinent old records.  Assessment/Plan Principal Problem:   Acute CVA (cerebrovascular accident) [facility])   # Acute CVA (cerebrovascular accident) [facility]) Patient is on Eliquis , not a candidate for TNK MRI positive for acute CVA.  Continue to monitor on telemetry Neurocheck/NIH as per protocol Resumed Eliquis  5 mg p.o. twice daily, cleared by neurology, no need of aspirin  Follow-up 2D echocardiogram, carotid duplex Risk stratification labs: Lipid profile, hemoglobin A1c and TSH level MR angio head without contrast Follow PT/OT and SLP eval Allow permissive hypertension but treat if SBP >180 as per neuro   # Paroxysmal A-fib and SVTs Continue to monitor on telemetry Resumed Eliquis  5 mg p.o. twice daily home dose Resumed Cardizem  and Toprol -XL home dose with holding parameters   # CAD, HTN, HLD, chronic systolic CHF, euvolemic compensated Patient is on Lasix  as needed, no need at this time Continue Crestor   # S/p Renal transplant, creatinine 1.27 on admission Continue monitor renal functions Prior history of ESRD and hemodialysis Continued immunosuppressants, tacrolimus , mycophenolate, prednisone , Bactrim 3 times a week Continue bicarb  # H/o gout on allopurinol   Nutrition:  DVT Prophylaxis: Therapeutic Anticoagulation with Eliquis   Advance goals of care discussion: Full code   Consults: I personally Discussed with neurologist Dr Renaee Caro  Family Communication: family was not present at bedside, at the time of interview.  Opportunity was given to ask question and all questions were answered satisfactorily.  Disposition: Admitted as observation, medical telemetry unit. Likely to be discharged home, in 1-2 days when cleared by neurology.  I have discussed plan of care as described above with RN and  patient/family.  Severity of Illness: The appropriate patient status for this patient is OBSERVATION. Observation status is judged to be reasonable and necessary in order to provide the required intensity of service to ensure the patient's safety. The patient's presenting symptoms, physical exam findings, and initial radiographic and laboratory data in the context of  their medical condition is felt to place them at decreased risk for further clinical deterioration. Furthermore, it is anticipated that the patient will be medically stable for discharge from the hospital within 2 midnights of admission.    Author: Althia Atlas, MD [facility] 10/22/2023 6:32 PM   To reach On-call, see care teams to locate the attending and reach out to them via [email or web address]. If 7PM-7AM, please contact night-coverage If you still have difficulty reaching the attending provider, please page the [facility] (Director on Call) for [facility] on amion for assistance.

## 2023-10-22 NOTE — Plan of Care (Incomplete)
 MRI: 1. Two small acute infarcts within the right cingulate gyrus (measuring up to 6 mm). 2. 2 mm acute infarct within mid-to-posterior left frontal lobe white matter. 3. Moderate background chronic small vessel ischemic changes within the cerebral white matter and pons.

## 2023-10-22 NOTE — Consult Note (Signed)
 NEUROLOGY CONSULT NOTE   Date of service: October 22, 2023 Patient Name: Brittany Charles MRN:  [id number] DOB:  [birth year] Chief Complaint: Left sided weakness Requesting Provider: Tonya Fredrickson, MD  History of Present Illness  Brittany Charles is a 73 y.o. female with a PMHx of anemia, arthritis, atrial fibrillation (on Eliquis ), CHF, HTN, lupus, CAD with prior MI and CKD 5 s/p transplant, on dialysis who presents to the ED with new onset of LLE > LUE weakness. Symptoms started at about 0700 while she was walking her dog, at which time she noticed LLE numbness and weakness. She was brought to the ED via EMS. MRI reveals small acute strokes in the bilateral ACA territories.    ROS  No aphasia, no current numbness, no vision changes, no facial droop, no CP. Positive for left hip pain for the past few days. Comprehensive ROS performed with other pertinent positives documented in HPI.   Past History   Past Medical History:  Diagnosis Date   Anemia    Arthritis    CHF (congestive heart failure) (HCC)    Colon polyps    COVID 2020   hospitalized at [facility]   Fibroids    GERD (gastroesophageal reflux disease)    Hypertension    Lupus (systemic lupus erythematosus) (HCC)    Myocardial infarction (HCC)    Pneumonia    Renal insufficiency    CKD stage 5- not on dialysis (07/08/21)   Past Surgical History:  Procedure Laterality Date   ABDOMINAL HYSTERECTOMY  10/27/2000   TAH, BSO   AV FISTULA PLACEMENT Left 05/27/2021   Procedure: LEFT BRACHIOCEPHALIC ARTERIOVENOUS (AV) FISTULA CREATION;  Surgeon: Kayla Part, MD;  Location: [facility] OR;  Service: Vascular;  Laterality: Left;  with peripheral nerve block   BIOPSY  09/02/2021   Procedure: BIOPSY;  Surgeon: Sergio Dandy, MD;  Location: [facility] ENDOSCOPY;  Service: Endoscopy;;   COLONOSCOPY WITH PROPOFOL  N/A 09/02/2021   Procedure: COLONOSCOPY WITH PROPOFOL ;  Surgeon: Sergio Dandy, MD;  Location: [facility] ENDOSCOPY;  Service:  Endoscopy;  Laterality: N/A;   LEFT AND RIGHT HEART CATHETERIZATION WITH CORONARY ANGIOGRAM Right 04/21/2011   Procedure: LEFT AND RIGHT HEART CATHETERIZATION WITH CORONARY ANGIOGRAM;  Surgeon: Arnoldo Lapping, MD;  Location: [facility] CATH LAB;  Service: Cardiovascular;  Laterality: Right;   LIGATION OF ARTERIOVENOUS  FISTULA Left 07/10/2021   Procedure: LEFT ARM BRANCH LIGATION OF ARTERIOVENOUS  FISTULA;  Surgeon: Kayla Part, MD;  Location: [facility] OR;  Service: Vascular;  Laterality: Left;  PERIPHERAL NERVE BLOCK   POLYPECTOMY  09/02/2021   Procedure: POLYPECTOMY;  Surgeon: Sergio Dandy, MD;  Location: [facility] ENDOSCOPY;  Service: Endoscopy;;   TONSILLECTOMY      Family History: Family History  Problem Relation Age of Onset   Hypertension Mother    Heart disease Mother        CHF   Stroke Mother    Diabetes Mother    Hyperlipidemia Father    Stroke Father    Hypertension Father    Asthma Brother    Kidney disease Brother    Stroke Maternal Grandmother    Colon cancer Other        several cousins   Breast cancer Other    Colon cancer Son    Hypertension Son     Social History  reports that she has never smoked. She has never used smokeless tobacco. She reports that she does not drink alcohol and does not use drugs.  Allergies  Allergen Reactions   Zithromax  [Azithromycin ] Nausea And Vomiting    Medications  No current facility-administered medications for this encounter.  Current Outpatient Medications:    acetaminophen  (TYLENOL ) 325 MG tablet, Take 650 mg by mouth every 6 (six) hours as needed for moderate pain., Disp: , Rfl:    albuterol  (VENTOLIN  HFA) 108 (90 Base) MCG/ACT inhaler, Inhale 2 puffs into the lungs every 6 (six) hours as needed for wheezing or shortness of breath., Disp: 8 g, Rfl: 2   allopurinol  (ZYLOPRIM ) 100 MG tablet, TAKE 1 TABLET EVERY DAY, Disp: 90 tablet, Rfl: 0   apixaban  (ELIQUIS ) 5 MG TABS tablet, TAKE 1 TABLET(5 MG) BY MOUTH TWICE DAILY, Disp: 60  tablet, Rfl: 5   DILT-XR 180 MG 24 hr capsule, Take 180 mg by mouth daily., Disp: , Rfl:    doxazosin (CARDURA) 2 MG tablet, Take 2 mg by mouth 2 (two) times daily., Disp: , Rfl:    doxycycline  (VIBRA -TABS) 100 MG tablet, Take 1 tablet (100 mg total) by mouth 2 (two) times daily., Disp: 20 tablet, Rfl: 0   furosemide  (LASIX ) 80 MG tablet, Take 80 mg by mouth daily as needed (swelling)., Disp: , Rfl:    hydrALAZINE  (APRESOLINE ) 50 MG tablet, TAKE 1 TABLET THREE TIMES DAILY, Disp: 270 tablet, Rfl: 0   Menthol, Topical Analgesic, (BIOFREEZE) 4 % GEL, Apply 1 application. topically 3 (three) times daily as needed (pain)., Disp: , Rfl:    metoprolol  succinate (TOPROL -XL) 25 MG 24 hr tablet, Take 1 tablet (25 mg total) by mouth daily. Take with or immediately following a meal., Disp: 90 tablet, Rfl: 3   mycophenolate (MYFORTIC) 180 MG EC tablet, Take 360 mg by mouth 2 (two) times daily., Disp: , Rfl:    pantoprazole  (PROTONIX ) 40 MG tablet, Take 40 mg by mouth daily., Disp: , Rfl:    prednisoLONE 5 MG TABS tablet, Take by mouth., Disp: , Rfl:    rosuvastatin  (CRESTOR ) 20 MG tablet, Take 1 tablet (20 mg total) by mouth daily., Disp: 90 tablet, Rfl: 3   sodium bicarbonate  650 MG tablet, Take 1,300 mg by mouth 2 (two) times daily., Disp: , Rfl:    sulfamethoxazole-trimethoprim (BACTRIM) 400-80 MG tablet, Take 1 tablet by mouth 3 (three) times a week., Disp: , Rfl:    tacrolimus  (PROGRAF ) 1 MG capsule, Take 3 mg by mouth in the AM, take 2 mg by mouth in the PM, Disp: , Rfl:    zinc  sulfate 220 (50 Zn) MG capsule, Take 1 capsule (220 mg total) by mouth daily. (Patient not taking: Reported on 09/02/2022), Disp: 30 capsule, Rfl: 0  Vitals   Vitals:   10/22/23 1500 10/22/23 1648 10/22/23 1651 10/22/23 1651  BP: (!) 167/64 (!) 169/98    Pulse: (!) 101 72 72   Resp: 17 17 14 17   Temp:  98.4 F (36.9 C)  98.4 F (36.9 C)  TempSrc:  Oral  Oral  SpO2: 100% 100% 100%   Weight:      Height:        Body  mass index is 35.78 kg/m.   Physical Exam   Constitutional: Appears well-developed and well-nourished.  Psych: Affect appropriate to situation.  Eyes: No scleral injection.  HENT: No OP obstruction.  Head: Normocephalic.  Respiratory: Effort normal, non-labored breathing.    Neurologic Examination   Mental Status: Awake and alert. Oriented x 5. Speech fluent with intact naming and comprehension. No dysarthria. No neglect.  Cranial Nerves: II: Temporal visual  fields intact with no extinction to DSS bilaterally   III,IV, VI: No ptosis. EOMI. No nystagmus.  V: Temp sensation equal bilaterally VII: Smile symmetric VIII: Hearing intact to conversation IX,X: No hoarseness or hypophonia XI: Symmetric XII: Midline tongue extension Motor: RUE 5/5 proximally and distally RLE 5/5 proximally and distally LUE 5/5 distally, 4/5 deltoid LLE 4-/5 hip flexion and knee flexion, 4+/5 knee extension, 5/5 ADF and APF No pronator drift Sensory: Temp and light touch intact throughout, bilaterally. No extinction to DSS.  Deep Tendon Reflexes: 2+ and symmetric bilateral brachioradialis, biceps and patellar reflexes. 2+ right achilles, 0 left achilles.  Cerebellar: No ataxia with FNF bilaterally Gait: Deferred  Labs/Imaging/Neurodiagnostic studies   CBC:  Recent Labs  Lab 11-01-23 1242  WBC 8.7  HGB 13.1  HCT 43.4  MCV 89.9  PLT 202   Basic Metabolic Panel:  Lab Results  Component Value Date   NA 141 2023-11-01   K 4.2 November 01, 2023   CO2 21 (L) 11/01/23   GLUCOSE 127 (H) 11-01-23   BUN 19 2023-11-01   CREATININE 1.27 (H) Nov 01, 2023   CALCIUM  9.3 2023-11-01   GFRNONAA 45 (L) 11-01-23   GFRAA 22 (L) 02/01/2020   Lipid Panel:  Lab Results  Component Value Date   LDLCALC 37 03/04/2021   HgbA1c:  Lab Results  Component Value Date   HGBA1C 6.5 (H) 12/31/2019   Urine Drug Screen: No results found for: LABOPIA, COCAINSCRNUR, LABBENZ, AMPHETMU, THCU, LABBARB   Alcohol Level No results found for: [facility] INR  Lab Results  Component Value Date   INR 1.1 01/02/2020   APTT  Lab Results  Component Value Date   APTT 36 01/02/2020     MRI Brain (Personally reviewed): 1. Two small acute infarcts within the right cingulate gyrus (measuring up to 6 mm). 2. 2 mm acute infarct within mid-to-posterior left frontal lobe white matter. 3. Moderate background chronic small vessel ischemic changes within the cerebral white matter and pons.    ASSESSMENT  SHARICKA POGORZELSKI is a 73 y.o. female with a PMHx of anemia, arthritis, atrial fibrillation (on Eliquis ), CHF, HTN, lupus, CAD with prior MI and CKD 5 s/p transplant, on dialysis who presents to the ED with new onset of LLE > LUE weakness. Symptoms started at about 0700 while she was walking her dog, at which time she noticed LLE numbness and weakness. She was brought to the ED via EMS. MRI reveals small acute strokes in the bilateral ACA territories.  - Exam reveals left deltoid and proximal LLE weakness.  - Imaging as above.  - Missed her dose of Eliquis  last night, prior to onset of her symptoms.  - Impression: Acute bilateral ACA territory strokes, most likely cardioembolic in the setting of her atrial fibrillation and CHF with missed Eliquis  dose last night.   RECOMMENDATIONS  - Recommend modified permissive HTN due to her age. Treat if SBP > 180.  - Continue her Eliquis . Strokes are small with low risk of hemorrhagic transformation.  - Statin - HgbA1c, fasting lipid panel - MRA of the brain without contrast - Carotid ultrasound - PT consult, OT consult, Speech consult - Echocardiogram - Risk factor modification - Telemetry monitoring - Frequent neuro checks - Counseled to maintain compliance with her medications.  - NPO until passes stroke swallow screen - Management of her CKD per primary  team   ______________________________________________________________________    Hope Ly, Tywon Niday, MD [facility]

## 2023-10-22 NOTE — ED Notes (Signed)
 Pt assisted to restroom in wheelchair. Pt able to stand and walk with assistance. Noticeable weakness to left leg

## 2023-10-23 ENCOUNTER — Observation Stay (HOSPITAL_BASED_OUTPATIENT_CLINIC_OR_DEPARTMENT_OTHER)

## 2023-10-23 DIAGNOSIS — I6389 Other cerebral infarction: Secondary | ICD-10-CM | POA: Diagnosis not present

## 2023-10-23 DIAGNOSIS — I639 Cerebral infarction, unspecified: Secondary | ICD-10-CM | POA: Diagnosis not present

## 2023-10-23 LAB — CBC
HCT: 40 % (ref 36.0–46.0)
Hemoglobin: 12.1 g/dL (ref 12.0–15.0)
MCH: 27.1 pg (ref 26.0–34.0)
MCHC: 30.3 g/dL (ref 30.0–36.0)
MCV: 89.7 fL (ref 80.0–100.0)
Platelets: 179 10*3/uL (ref 150–400)
RBC: 4.46 MIL/uL (ref 3.87–5.11)
RDW: 15.8 % — ABNORMAL HIGH (ref 11.5–15.5)
WBC: 7.9 10*3/uL (ref 4.0–10.5)
nRBC: 0 % (ref 0.0–0.2)

## 2023-10-23 LAB — BASIC METABOLIC PANEL WITH GFR
Anion gap: 7 (ref 5–15)
BUN: 20 mg/dL (ref 8–23)
CO2: 23 mmol/L (ref 22–32)
Calcium: 9.1 mg/dL (ref 8.9–10.3)
Chloride: 110 mmol/L (ref 98–111)
Creatinine, Ser: 1.46 mg/dL — ABNORMAL HIGH (ref 0.44–1.00)
GFR, Estimated: 38 mL/min — ABNORMAL LOW (ref >60)
Glucose, Bld: 108 mg/dL — ABNORMAL HIGH (ref 70–99)
Potassium: 3.8 mmol/L (ref 3.5–5.1)
Sodium: 140 mmol/L (ref 135–145)

## 2023-10-23 LAB — ECHOCARDIOGRAM COMPLETE
AR max vel: 1.66 cm2
AV Area VTI: 1.81 cm2
AV Area mean vel: 1.74 cm2
AV Mean grad: 6.5 mmHg
AV Peak grad: 13.7 mmHg
Ao pk vel: 1.85 m/s
Area-P 1/2: 5.02 cm2
Height: 65 in
S' Lateral: 3.3 cm
Weight: 3440 [oz_av]

## 2023-10-23 LAB — MAGNESIUM: Magnesium: 1.8 mg/dL (ref 1.7–2.4)

## 2023-10-23 LAB — PHOSPHORUS: Phosphorus: 3.8 mg/dL (ref 2.5–4.6)

## 2023-10-23 MED ORDER — ROSUVASTATIN CALCIUM 40 MG PO TABS: 40.0000 mg | ORAL_TABLET | Freq: Every day | ORAL | 0 refills | Status: AC

## 2023-10-23 MED ORDER — ROSUVASTATIN CALCIUM 20 MG PO TABS: 40.0000 mg | ORAL_TABLET | Freq: Every day | ORAL | Status: DC

## 2023-10-23 NOTE — Care Management Obs Status (Signed)
 MEDICARE OBSERVATION STATUS NOTIFICATION   Patient Details  Name: Brittany Charles MRN: [id number] Date of Birth: Nov 24, 1950   Medicare Observation Status Notification Given:  Yes    [name], RN 10/23/2023, 8:59 AM

## 2023-10-23 NOTE — Progress Notes (Addendum)
 STROKE TEAM PROGRESS NOTE    INTERIM HISTORY/SUBJECTIVE Family at the bedside.  Patient is awake and alert in no apparent distress She endorses still having some mild left-sided weakness.  She states that she has not missed any of her Eliquis  doses  OBJECTIVE  CBC    Component Value Date/Time   WBC 7.9 10/23/2023 0514   RBC 4.46 10/23/2023 0514   HGB 12.1 10/23/2023 0514   HCT 40.0 10/23/2023 0514   PLT 179 10/23/2023 0514   MCV 89.7 10/23/2023 0514   MCV 81.4 09/12/2014 1602   MCH 27.1 10/23/2023 0514   MCHC 30.3 10/23/2023 0514   RDW 15.8 (H) 10/23/2023 0514   LYMPHSABS 1.5 03/04/2021 0013   MONOABS 1.8 (H) 03/04/2021 0013   EOSABS 0.0 03/04/2021 0013   BASOSABS 0.1 03/04/2021 0013    BMET    Component Value Date/Time   NA 140 10/23/2023 0514   NA 140 02/01/2020 0852   K 3.8 10/23/2023 0514   CL 110 10/23/2023 0514   CO2 23 10/23/2023 0514   GLUCOSE 108 (H) 10/23/2023 0514   BUN 20 10/23/2023 0514   BUN 17 02/01/2020 0852   CREATININE 1.46 (H) 10/23/2023 0514   CREATININE 2.41 (H) 09/13/2014 1108   CALCIUM  9.1 10/23/2023 0514   CALCIUM  9.2 05/23/2021 1430   GFRNONAA 38 (L) 10/23/2023 0514   GFRNONAA 21 (L) 09/13/2014 1108    IMAGING past 24 hours MR ANGIO HEAD WO CONTRAST Result Date: 10/22/2023 CLINICAL DATA:  Initial evaluation for acute neuro deficit, stroke. EXAM: MRA HEAD WITHOUT CONTRAST TECHNIQUE: Angiographic images of the Circle of Willis were acquired using MRA technique without intravenous contrast. COMPARISON:  Brain MRI from earlier the same day. FINDINGS: Anterior circulation: Examination mildly degraded by motion artifact. Both internal carotid arteries are patent through the siphons without hemodynamically significant stenosis. 3 mm laterally directed outpouching arising from the cavernous right ICA consistent with a small aneurysm (series 5, image 102). A1 segments, anterior communicating artery complex, and anterior cerebral arteries patent without  stenosis. No M1 stenosis or occlusion. Distal MCA branches perfused and symmetric. Posterior circulation: Both vertebral arteries are patent to the vertebrobasilar junction without stenosis. Left vertebral artery slightly dominant. Both PICA patent. Basilar patent without stenosis. Superior cerebral arteries patent bilaterally. Right PCA primarily supplied via the basilar, although a small right posterior communicating artery noted. Fetal type origin of the left PCA. Both PCAs patent to their distal aspects without significant stenosis. Anatomic variants: Fetal type left PCA. Other: None. IMPRESSION: 1. Negative intracranial MRA for large vessel occlusion or other emergent finding. No hemodynamically significant or correctable stenosis. 2. 3 mm laterally directed aneurysm arising from the cavernous right ICA. Electronically Signed   By: Virgia Griffins M.D.   On: 10/22/2023 21:21   MR BRAIN WO CONTRAST Result Date: 10/22/2023 CLINICAL DATA:  Provided history: Neuro deficit, acute, stroke suspected. EXAM: MRI HEAD WITHOUT CONTRAST TECHNIQUE: Multiplanar, multiecho pulse sequences of the brain and surrounding structures were obtained without intravenous contrast. COMPARISON:  Head CT 09/19/2014. FINDINGS: Brain: No age-advanced or lobar predominant cerebral atrophy. 2 mm acute infarct within the mid-to-posterior left frontal lobe white matter (centrum semiovale) (series 2, image 36). Two acute infarcts within the right cingulate gyrus measuring up to 6 mm (series 2, image 35) (series 4, image 18). Multifocal T2 FLAIR hyperintense signal abnormality within the cerebral white matter and pons, nonspecific but compatible with moderate chronic small vessel ischemic disease. Punctate chronic microhemorrhage within the left occipital lobe.  No evidence of an intracranial mass. No extra-axial fluid collection. No midline shift. Vascular: Maintained flow voids within the proximal large arterial vessels. Skull and upper  cervical spine: No focal worrisome marrow lesion. Incompletely assessed cervical spondylosis. Sinuses/Orbits: No mass or acute finding within the imaged orbits. No significant paranasal sinus disease. IMPRESSION: 1. Two small acute infarcts within the right cingulate gyrus (measuring up to 6 mm). 2. 2 mm acute infarct within mid-to-posterior left frontal lobe white matter. 3. Moderate background chronic small vessel ischemic changes within the cerebral white matter and pons. Electronically Signed   By: Bascom Lily D.O.   On: 10/22/2023 16:57   DG Hip Unilat W or Wo Pelvis 2-3 Views Left Result Date: 10/22/2023 CLINICAL DATA:  Left leg numbness and weakness. EXAM: DG HIP (WITH OR WITHOUT PELVIS) 2-3V LEFT COMPARISON:  CT abdomen/pelvis dated 03/04/2021. FINDINGS: No acute fracture or dislocation. Femoral heads are seated within the acetabula. Mild osteoarthritis of the bilateral hips. Sacroiliac joints are anatomically aligned with degenerative changes. Pubic symphysis is anatomically aligned. Degenerative changes of the visualized lower lumbar spine. IMPRESSION: 1. No acute osseous abnormality. 2. Mild osteoarthritis of the bilateral hips. Electronically Signed   By: Mannie Seek M.D.   On: 10/22/2023 14:31    Vitals:   10/23/23 0014 10/23/23 0416 10/23/23 0849 10/23/23 1157  BP: (!) 163/71 (!) 171/115 (!) 188/69 (!) 172/57  Pulse: 71 75 84 75  Resp: 18 18  18   Temp: 98.6 F (37 C) 98.7 F (37.1 C) 98.4 F (36.9 C) 98.9 F (37.2 C)  TempSrc: Oral Oral Oral Oral  SpO2: 97% 97% 97% 95%  Weight:      Height:         PHYSICAL EXAM General:  Alert, well-nourished, well-developed patient in no acute distress Psych:  Mood and affect appropriate for situation CV: Regular rate and rhythm on monitor Respiratory:  Regular, unlabored respirations on room air GI: Abdomen soft and nontender   NEURO:  Mental Status: AA&Ox3, patient is able to give clear and coherent history Speech/Language:  speech is without dysarthria or aphasia.  Naming, repetition, fluency, and comprehension intact.  Cranial Nerves:  II: PERRL. Visual fields full.  III, IV, VI: EOMI. Eyelids elevate symmetrically.  V: Sensation is intact to light touch and symmetrical to face.  VII: Face is symmetrical resting and smiling VIII: hearing intact to voice. IX, X: Palate elevates symmetrically. Phonation is normal.  [id number] shrug 5/5. XII: tongue is midline without fasciculations. Motor: 5/5 strength t in right upper extremity and right lower extremity.  Left upper extremity and left lower extremity 4/5 Tone: is normal and bulk is normal Sensation- Intact to light touch bilaterally. Extinction absent to light touch to DSS.   Coordination: FTN intact bilaterally, HKS: no ataxia in BLE.No drift.  Gait- deferred  Most Recent NIH  1a Level of Conscious.:  1b LOC Questions:  1c LOC Commands:  2 Best Gaze:  3 Visual:  4 Facial Palsy:  5a Motor Arm - left: 1 5b Motor Arm - Right:  6a Motor Leg - Left: 1 6b Motor Leg - Right:  7 Limb Ataxia:  8 Sensory:  9 Best Language:  10 Dysarthria:  11 Extinct. and Inatten.:  TOTAL: 2   ASSESSMENT/PLAN  Ms. ROSELLEN LICHTENBERGER is a 73 y.o. female with history of anemia, arthritis, atrial fibrillation (on Eliquis ), CHF, HTN, lupus, CAD with prior MI and CKD 5 s/p transplant, on dialysis who presents to the ED with  new onset of LLE > LUE weakness.  Acute Ischemic Infarct: right cingulate gyrus and  left frontal lobe Etiology: Likely cardioembolic in the setting of A-fib on Eliquis   MRI  Two small acute infarcts within the right cingulate gyrus. 2 mm acute infarct within mid-to-posterior left frontal lobe.  chronic small vessel ischemic changes within the cerebral white matter and pons. MRA negative Carotid Doppler ordered 2D Echo EF 55 to 60%.  Mild LVH with grade 2 diastolic dysfunction.  Left atrium mildly dilated LDL 77 HgbA1c 6.3 VTE prophylaxis -on  Eliquis  Eliquis  prior to admission, now on Eliquis   Will need follow-up outpatient neurology in 8 weeks after discharge Therapy recommendations:  Pending Disposition: Pending  Atrial fibrillation Home Meds: Eliquis , diltiazem  180 mg Continue telemetry monitoring Continue anticoagulation with Eliquis   Hypertension CAD CHF Home meds: Lasix  80 mg, hydralazine  50 mg, metoprolol  25 mg Stable Blood Pressure Goal: SBP less than 160   Hyperlipidemia Home meds: Crestor  20 mg,  resumed in hospital LDL 77, goal < 70 Increased Crestor  to 40 Continue statin at discharge  Diabetes type II Controlled Home meds: Semaglutide  HgbA1c 6.3, goal < 7.0 CBGs SSI Recommend close follow-up with PCP for better DM control   Dysphagia Patient has post-stroke dysphagia, SLP consulted    Diet   Diet Heart Room service appropriate? Yes; Fluid consistency: Thin   Advance diet as tolerated  Other Stroke Risk Factors Obesity, Body mass index is 35.78 kg/m., BMI >/= 30 associated with increased stroke risk, recommend weight loss, diet and exercise as appropriate  Coronary artery disease Congestive heart failure   Other Active Problems S/p renal transplant Gout Lupus GERD  Hospital day # 0  Jonette Nestle DNP, ACNPC-AG  [facility]    I have personally obtained history,examined this patient, reviewed notes, independently viewed imaging studies, participated in medical decision making and plan of care.ROS completed by me personally and pertinent positives fully documented  I have made any additions or clarifications directly to the above note. Agree with note above. Doing well, very mild weakness in the right hand and RLE. Family reports that almost back to her baseline. She might benefit from outpatient rehab. Continue with Eliquis , increase Crestor  to 40, will need neuro follow up in 8 weeks. We will sign off, please call for any additional questions or concerns. I spent a total of  40 minutes dedicated to the care of this patient.    Cassandra Cleveland, MD Neurology  10/23/2023 1:12 PM     To contact Stroke Continuity provider, please refer to [email or web address]. After hours, contact General Neurology

## 2023-10-23 NOTE — Progress Notes (Signed)
 VASCULAR LAB    Carotid duplex has been performed.  See CV proc for preliminary results.   Maks Cavallero, RVT 10/23/2023, 5:01 PM

## 2023-10-23 NOTE — Evaluation (Signed)
 Physical Therapy Evaluation Patient Details Name: Brittany Charles MRN: [id number] DOB: 08/16/1950 Today's Date: 10/23/2023  History of Present Illness  Pt is a 73 yo female admitted to [facility] on 10/22/23 with c/o LLE weakness. MRI demonstrated infarcts to the L frontal lobe and R cingulate gyrus, small aneurysm of the R ICA. PMH of CAD, HTN, chronic systolic CHF LVEF 45 to 50%, paroxysmal A-fib/SVTs on DOAC, gout, lupus, ESRD history of HD, s/p renal transplant 11/21/2021, h/o L ankle fusion   Clinical Impression  Pt admitted with above diagnosis. PTA pt lived at home with family, I/mod I mobility/ADLs without AD, driving. Pt currently with functional limitations due to the deficits listed below (see PT Problem List). On eval, pt required CGA transfers and amb 175' without AD. Pt relays current gait pattern/speed is baseline due to h/o L ankle fusion. Good sitting balance and fair standing balance. Pt will benefit from acute skilled PT to increase their independence and safety with mobility to allow discharge. Post acute, pt would benefit from OPPT to address high level balance activities.            If plan is discharge home, recommend the following: Assist for transportation;Assistance with cooking/housework;Help with stairs or ramp for entrance   Can travel by private vehicle        Equipment Recommendations None recommended by PT  Recommendations for Other Services       Functional Status Assessment Patient has had a recent decline in their functional status and demonstrates the ability to make significant improvements in function in a reasonable and predictable amount of time.     Precautions / Restrictions Precautions Precautions: Fall Recall of Precautions/Restrictions: Intact      Mobility  Bed Mobility               General bed mobility comments: Pt sitting EOB.    Transfers Overall transfer level: Needs assistance Equipment used: None Transfers: Sit to/from  Stand Sit to Stand: Contact guard assist           General transfer comment: increased time to power up    Ambulation/Gait Ambulation/Gait assistance: Contact guard assist Gait Distance (Feet): 175 Feet Assistive device: None Gait Pattern/deviations: Step-through pattern, Decreased weight shift to left, Decreased stride length Gait velocity: decreased Gait velocity interpretation: 1.31 - 2.62 ft/sec, indicative of limited community ambulator   General Gait Details: Pt reports current gait pattern is baseline from previous L ankle fusion.  Stairs            Wheelchair Mobility     Tilt Bed    Modified Rankin (Stroke Patients Only) Modified Rankin (Stroke Patients Only) Pre-Morbid Rankin Score: No significant disability Modified Rankin: Moderate disability     Balance Overall balance assessment: Needs assistance Sitting-balance support: No upper extremity supported, Feet supported Sitting balance-Leahy Scale: Good     Standing balance support: No upper extremity supported, During functional activity Standing balance-Leahy Scale: Fair                               Pertinent Vitals/Pain Pain Assessment Pain Assessment: No/denies pain    Home Living Family/patient expects to be discharged to:: Private residence Living Arrangements: Children Available Help at Discharge: Family;Available 24 hours/day Type of Home: House Home Access: Stairs to enter Entrance Stairs-Rails: Can reach both Entrance Stairs-Number of Steps: 2   Home Layout: One level Home Equipment: BSC/3in1;Cane - single point;Shower Counsellor (  2 wheels)      Prior Function Prior Level of Function : Independent/Modified Independent;Driving             Mobility Comments: amb community distances without AD       Extremity/Trunk Assessment   Upper Extremity Assessment Upper Extremity Assessment: Defer to OT evaluation    Lower Extremity Assessment Lower  Extremity Assessment: LLE deficits/detail LLE Deficits / Details: 4/5    Cervical / Trunk Assessment Cervical / Trunk Assessment: Normal  Communication   Communication Communication: No apparent difficulties    Cognition Arousal: Alert Behavior During Therapy: WFL for tasks assessed/performed   PT - Cognitive impairments: No apparent impairments                         Following commands: Intact       Cueing Cueing Techniques: Verbal cues     General Comments General comments (skin integrity, edema, etc.): VSS on RA. 1/4 DOE    Exercises     Assessment/Plan    PT Assessment Patient needs continued PT services  PT Problem List Decreased strength;Decreased balance;Decreased mobility;Decreased activity tolerance       PT Treatment Interventions Therapeutic activities;Gait training;Therapeutic exercise;Patient/family education;Balance training;Stair training;Functional mobility training    PT Goals (Current goals can be found in the Care Plan section)  Acute Rehab PT Goals Patient Stated Goal: home PT Goal Formulation: With patient Time For Goal Achievement: 11/06/23 Potential to Achieve Goals: Good    Frequency Min 3X/week     Co-evaluation               AM-PAC PT 6 Clicks Mobility  Outcome Measure Help needed turning from your back to your side while in a flat bed without using bedrails?: None Help needed moving from lying on your back to sitting on the side of a flat bed without using bedrails?: None Help needed moving to and from a bed to a chair (including a wheelchair)?: A Little Help needed standing up from a chair using your arms (e.g., wheelchair or bedside chair)?: A Little Help needed to walk in hospital room?: A Little Help needed climbing 3-5 steps with a railing? : A Little 6 Click Score: 20    End of Session Equipment Utilized During Treatment: Gait belt Activity Tolerance: Patient tolerated treatment well Patient left: in  bed;with family/visitor present;with call bell/phone within reach Nurse Communication: Mobility status PT Visit Diagnosis: Other abnormalities of gait and mobility (R26.89)    Time: 2956-2130 PT Time Calculation (min) (ACUTE ONLY): 17 min   Charges:   PT Evaluation $PT Eval Moderate Complexity: 1 Mod   PT General Charges $$ ACUTE PT VISIT: 1 Visit         Dorothye Gathers., PT  Office # [phone]   Guadelupe Leech 10/23/2023, 9:24 AM

## 2023-10-23 NOTE — Evaluation (Signed)
 Occupational Therapy Evaluation Patient Details Name: Brittany Charles MRN: [id number] DOB: 1950/06/20 Today's Date: 10/23/2023   History of Present Illness   Pt is a 73 yo female admitted to [facility] on 10/22/23 with c/o LLE weakness. MRI demonstrated infarcts to the L frontal lobe and R cingulate gyrus, small aneurysm of the R ICA. PMH of CAD, HTN, chronic systolic CHF LVEF 45 to 50%, paroxysmal A-fib/SVTs on DOAC, gout, lupus, ESRD history of HD, s/p renal transplant 11/21/2021, h/o L ankle fusion     Clinical Impressions Pt admitted for above, PTA pt reports being ind with ADLs/iADLs and driving. She currently presents with higher level balance deficits and weakness/impaired coordination of the LUE. Noted to undershoot with L FTN. Educated pt on [facility] HEP and provided squeeze ball to address LUE deficits. Pt able to complete Adls with CGA to Ind level at this time, ambulates hallway with CGA no AD. OT to continue following pt while in acute setting to address listed deficits. Recommend pt attend Outpatient OT services to further address deficits.      If plan is discharge home, recommend the following:   Assistance with cooking/housework     Functional Status Assessment   Patient has had a recent decline in their functional status and demonstrates the ability to make significant improvements in function in a reasonable and predictable amount of time.     Equipment Recommendations   None recommended by OT     Recommendations for Other Services         Precautions/Restrictions   Precautions Precautions: Fall Recall of Precautions/Restrictions: Intact Restrictions Weight Bearing Restrictions Per Provider Order: No     Mobility Bed Mobility               General bed mobility comments: Pt sitting EOB on arrival and departure    Transfers Overall transfer level: Needs assistance Equipment used: None Transfers: Sit to/from Stand Sit to Stand: Contact guard  assist           General transfer comment: increased time to power up      Balance Overall balance assessment: Needs assistance Sitting-balance support: No upper extremity supported, Feet supported Sitting balance-Leahy Scale: Good     Standing balance support: No upper extremity supported, During functional activity Standing balance-Leahy Scale: Fair                             ADL either performed or assessed with clinical judgement   ADL Overall ADL's : Needs assistance/impaired Eating/Feeding: Independent;Sitting   Grooming: Standing;Supervision/safety   Upper Body Bathing: Supervision/ safety;Sitting;Set up   Lower Body Bathing: Set up;Supervison/ safety;Sitting/lateral leans   Upper Body Dressing : Sitting;Independent Upper Body Dressing Details (indicate cue type and reason): don gown like jacket Lower Body Dressing: Set up;Sitting/lateral leans   Toilet Transfer: Contact guard assist;Ambulation   Toileting- Clothing Manipulation and Hygiene: Sit to/from stand;Contact guard assist       Functional mobility during ADLs: Contact guard assist       Vision Baseline Vision/History: 0 No visual deficits Ability to See in Adequate Light: 0 Adequate Patient Visual Report: No change from baseline Vision Assessment?: Yes;No apparent visual deficits Eye Alignment: Within Functional Limits Ocular Range of Motion: Within Functional Limits Alignment/Gaze Preference: Within Defined Limits Tracking/Visual Pursuits: Able to track stimulus in all quads without difficulty Convergence: Within functional limits Visual Fields: No apparent deficits Diplopia Assessment:  (denies)     Perception  Perception: Within Functional Limits       Praxis Praxis: WFL       Pertinent Vitals/Pain Pain Assessment Pain Assessment: No/denies pain     Extremity/Trunk Assessment Upper Extremity Assessment Upper Extremity Assessment: Right hand dominant;LUE  deficits/detail LUE Deficits / Details: Shoulder flexion 3+/5 MMT, elbow flex/ext 4/5. Weak grasp and incoordinated FTN often undershooting or veering R of object. ROM WFL. LUE Sensation: WNL LUE Coordination: decreased fine motor   Lower Extremity Assessment Lower Extremity Assessment: LLE deficits/detail LLE Deficits / Details: 4/5   Cervical / Trunk Assessment Cervical / Trunk Assessment: Normal   Communication Communication Communication: No apparent difficulties   Cognition Arousal: Alert Behavior During Therapy: WFL for tasks assessed/performed Cognition: No apparent impairments                               Following commands: Intact       Cueing  General Comments   Cueing Techniques: Verbal cues  VSS on RA. 1/4 DOE   Exercises     Shoulder Instructions      Home Living Family/patient expects to be discharged to:: Private residence Living Arrangements: Children Available Help at Discharge: Family;Available 24 hours/day Type of Home: House Home Access: Stairs to enter [employer] of Steps: 2 Entrance Stairs-Rails: Can reach both Home Layout: One level     Bathroom Shower/Tub: Chief Strategy Officer: Handicapped height     Home Equipment: BSC/3in1;Cane - single point;Shower Counsellor (2 wheels)   Additional Comments: denies falls      Prior Functioning/Environment Prior Level of Function : Independent/Modified Independent;Driving             Mobility Comments: amb community distances without AD ADLs Comments: ind    OT Problem List: Decreased strength;Impaired balance (sitting and/or standing)   OT Treatment/Interventions: Patient/family education;Therapeutic exercise;Self-care/ADL training;Therapeutic activities      OT Goals(Current goals can be found in the care plan section)   Acute Rehab OT Goals Patient Stated Goal: go home OT Goal Formulation: With patient Time For Goal Achievement:  11/06/23 Potential to Achieve Goals: Good   OT Frequency:  Min 1X/week    Co-evaluation              AM-PAC OT 6 Clicks Daily Activity     Outcome Measure Help from another person eating meals?: None Help from another person taking care of personal grooming?: A Little Help from another person toileting, which includes using toliet, bedpan, or urinal?: A Little Help from another person bathing (including washing, rinsing, drying)?: A Little Help from another person to put on and taking off regular upper body clothing?: None Help from another person to put on and taking off regular lower body clothing?: A Little 6 Click Score: 20   End of Session Equipment Utilized During Treatment: Gait belt Nurse Communication: Mobility status  Activity Tolerance: Patient tolerated treatment well Patient left: in bed;with call bell/phone within reach;with family/visitor present;Other (comment) (EOB with family)  OT Visit Diagnosis: Muscle weakness (generalized) (M62.81);Hemiplegia and hemiparesis;Unsteadiness on feet (R26.81) Hemiplegia - Right/Left: Left Hemiplegia - dominant/non-dominant: Non-Dominant Hemiplegia - caused by: Cerebral infarction                Time: 1610-9604 OT Time Calculation (min): 23 min Charges:  OT General Charges $OT Visit: 1 Visit OT Evaluation $OT Eval Low Complexity: 1 Low  10/23/2023  AB, OTR/L  Acute Rehabilitation Services  Office: [phone]   Jorene New 10/23/2023, 9:41 AM

## 2023-10-23 NOTE — Progress Notes (Signed)
 Discharged to home after IV access removed and discharge instructions given per order.  Granddaughter came to pick her up.

## 2023-10-23 NOTE — Discharge Summary (Signed)
 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 female with Past medical history of CAD, HTN, HFrecEF, paroxysmal Brittany Charles on DOAC, gout, lupus, ESRD history of HD, s/p renal transplant 11/21/2021, presented at [facility] ED with complaining of left lower extremity weakness.  As per patient she was walking her dog and noticed left-sided weakness.    MRI revealed acute strokes.    Seen by neurology who suspects this is related to atrial fib.  Continue eliquis , increase crestor .  Outpatient neuro follow up.  See below for additional details.    Hospital Course:  Assessment and Plan:  Acute Strokes MRI with 2 small infarcts within the right cingulate gyrus, 2 mm acute infarct within mid to posterior L frontal lobe white matter.  MRA negative for LVO or emergent findings Carotid US  with 1-39% stenosis bilaterally, bilateral antegrade flow in vertebral arteries Echo with EF 55-60%, diastolic dysfunction (See report) PT/OT/SLP recommending outpatient therapy LDL 77, A1c 6.3 Appreciate neurology assistance - suspected cardioembolic in setting of atrial fibrillation, continue eliquis .  Increase crestor  to 40 mg daily.  Follow up with neurology outpatient.   # Paroxysmal Brittany Charles Continue to monitor on telemetry Eliquis  5 mg p.o. twice daily home dose Cardizem  and  Toprol -XL    HFrecEF, euvolemic compensated Lasix  prn swelling at home   # CAD, HTN, HLD Continue Crestor  Hydralazine , cardizem , metop, doxazosin   # S/p Renal transplant, creatinine 1.27 on admission Continue monitor renal functions Prior history of ESRD and hemodialysis Continued immunosuppressants, tacrolimus , mycophenolate, prednisone , Bactrim 3 times Brittany Charles week Continue bicarb   # H/o gout on allopurinol     Procedures: Carotid US  Summary:  Right Carotid: Velocities in the right ICA are consistent with Brittany Charles 1-39%  stenosis.   Left Carotid: Velocities in the left ICA are consistent with Brittany Charles 1-39%  stenosis.   Vertebrals: Bilateral vertebral arteries demonstrate antegrade flow.  Subclavians: Normal flow hemodynamics were seen in bilateral subclavian               arteries.    Echo IMPRESSIONS     1. Left ventricular ejection fraction, by estimation, is 55 to 60%. The  left ventricle has normal function. The left ventricle has no regional  wall motion abnormalities. There is mild left ventricular hypertrophy.  Left ventricular diastolic parameters  are consistent with Grade II diastolic dysfunction (pseudonormalization).   2. Right ventricular systolic function is normal. The right ventricular  size is normal. Tricuspid regurgitation signal is inadequate for assessing  PA pressure.   3. Left atrial size was mildly dilated.   4. The mitral valve is degenerative. Mild to moderate mitral valve  regurgitation. No evidence of mitral stenosis.   5. The aortic valve was not well visualized. Aortic valve regurgitation  is trivial. No aortic stenosis is present.   6. The inferior vena cava is normal in size with <50% respiratory  variability, suggesting right atrial  pressure of 8 mmHg.   7. Cannot exclude Brittany Charles small PFO.   Conclusion(s)/Recommendation(s): No intracardiac source of embolism  detected on this transthoracic study. Consider Brittany Charles transesophageal  echocardiogram to exclude  cardiac source of embolism if clinically  indicated.   Consultations: neurology  Discharge Exam: Vitals:   10/23/23 1157 10/23/23 1555  BP: (!) 172/57 (!) 159/58  Pulse: 75 74  Resp: 18 18  Temp: 98.9 F (37.2 C) 97.7 F (36.5 C)  SpO2: 95% 98%   Feeling better, still Brittany Charles little weak  General: No acute distress. Cardiovascular: RRR Lungs: unlabored Abdomen: Soft, nontender, nondistended Neurological: Alert and oriented 3. CN 2-12 intact.  Mild L sided weakness, 4+/5.  FNF and heel to shin intact.  Skin: Warm and dry. No rashes or lesions. Extremities: No clubbing or cyanosis. No edema.   Discharge Instructions   Discharge Instructions     Ambulatory referral to Neurology   Complete by: As directed    An appointment is requested in approximately: 8 weeks   Ambulatory referral to Physical Therapy   Complete by: As directed    Call MD for:  difficulty breathing, headache or visual disturbances   Complete by: As directed    Call MD for:  extreme fatigue   Complete by: As directed    Call MD for:  hives   Complete by: As directed    Call MD for:  persistant dizziness or light-headedness   Complete by: As directed    Call MD for:  persistant nausea and vomiting   Complete by: As directed    Call MD for:  redness, tenderness, or signs of infection (pain, swelling, redness, odor or green/yellow discharge around incision site)   Complete by: As directed    Call MD for:  severe uncontrolled pain   Complete by: As directed    Call MD for:  temperature >100.4   Complete by: As directed    Diet - low sodium heart healthy   Complete by: As directed    Discharge instructions   Complete by: As directed    You were seen for Brittany Charles stroke.  This was suspected to be cardioembolic in the setting of your atrial fibrillation.    Continue your eliquis  as prescribed.  We'll increase your crestor  to 40 mg daily.  You should follow up with neurology after discharge.    Your ultrasound  of your heart showed Brittany Charles normal pump (squeeze), but did show evidence of stiffness (impaired relaxation, diastolic dysfunction).  Your carotid ultrasounds showed good flow.  There was an aneurysm coming off the right carotid.  Follow this with neurology outpatient.   You should follow with physical therapy as an outpatient.  Return for new, recurrent, or worsening symptoms.  Please ask your PCP to request records from this hospitalization so they know what was done and what the next steps will be.   Increase activity slowly   Complete by: As directed       Allergies as of 10/23/2023       Reactions   Zithromax  [azithromycin ] Nausea And Vomiting        Medication List     STOP taking these medications    doxycycline  100 MG tablet Commonly known as: VIBRA -TABS       TAKE these medications    acetaminophen  325 MG tablet Commonly known as: TYLENOL  Take 650 mg by mouth every 6 (six) hours as needed for moderate pain.   albuterol  108 (90 Base) MCG/ACT inhaler  Commonly known as: VENTOLIN  HFA Inhale 2 puffs into the lungs every 6 (six) hours as needed for wheezing or shortness of breath.   allopurinol  100 MG tablet Commonly known as: ZYLOPRIM  TAKE 1 TABLET EVERY DAY   Biofreeze 4 % Gel Generic drug: Menthol (Topical Analgesic) Apply 1 application. topically 3 (three) times daily as needed (pain).   Dilt-XR 180 MG 24 hr capsule Generic drug: diltiazem  Take 180 mg by mouth daily.   doxazosin 2 MG tablet Commonly known as: CARDURA Take 2 mg by mouth 2 (two) times daily.   Eliquis  5 MG Tabs tablet Generic drug: apixaban  TAKE 1 TABLET(5 MG) BY MOUTH TWICE DAILY   furosemide  80 MG tablet Commonly known as: LASIX  Take 80 mg by mouth daily as needed (swelling).   hydrALAZINE  50 MG tablet Commonly known as: APRESOLINE  TAKE 1 TABLET THREE TIMES DAILY   metoprolol  succinate 25 MG 24 hr tablet Commonly known as: TOPROL -XL Take 1 tablet (25 mg total) by mouth daily.  Take with or immediately following Corie Vavra meal.   mycophenolate 180 MG EC tablet Commonly known as: MYFORTIC Take 360 mg by mouth 2 (two) times daily.   pantoprazole  40 MG tablet Commonly known as: PROTONIX  Take 40 mg by mouth See admin instructions. Take one tablet by mouth on Monday, Wednesdays and Fridays only per patient   predniSONE  5 MG tablet Commonly known as: DELTASONE  Take 5 mg by mouth daily.   rosuvastatin  40 MG tablet Commonly known as: CRESTOR  Take 1 tablet (40 mg total) by mouth at bedtime. What changed:  medication strength how much to take when to take this   Semaglutide(0.25 or 0.5MG /DOS) 2 MG/3ML Sopn Inject 0.25 mg into the skin once Malyn Aytes week.   sulfamethoxazole-trimethoprim 400-80 MG tablet Commonly known as: BACTRIM Take 1 tablet by mouth See admin instructions. Take one tablet by mouth on Monday, Wednesdays and Fridays only per patient   tacrolimus  1 MG capsule Commonly known as: PROGRAF  Take 2 mg by mouth 2 (two) times daily.   zinc  sulfate (50mg  elemental zinc ) 220 (50 Zn) MG capsule Take 1 capsule (220 mg total) by mouth daily.       Allergies  Allergen Reactions   Zithromax  [Azithromycin ] Nausea And Vomiting      The results of significant diagnostics from this hospitalization (including imaging, microbiology, ancillary and laboratory) are listed below for reference.    Significant Diagnostic Studies: VAS US  CAROTID Result Date: 10/23/2023 Carotid Arterial Duplex Study Patient Name:  KATRYNA TSCHIRHART  Date of Exam:   10/23/2023 Medical Rec #: [id number]           Accession #:    [id number] Date of Birth: July 14, 1950           Patient Gender: F Patient Age:   21 years Exam Location:  [facility] Procedure:      VAS US  CAROTID Referring Phys: Althia Atlas --------------------------------------------------------------------------------  Indications:       CVA and Weakness. Risk Factors:      Hypertension, coronary artery disease. Other Factors:      Lupus, ESRD, status post kidney transplant 7/23, CHF,                    Paroxysmal atrial fibrillation/SVT (on Eliquis ). Limitations        Today's exam was limited due to the high bifurcation of the                    carotid  and the patient's respiratory variation. Comparison Study:  No prior study on file Performing Technologist: Carleene Chase RVS  Examination Guidelines: Stepan Verrette complete evaluation includes B-mode imaging, spectral Doppler, color Doppler, and power Doppler as needed of all accessible portions of each vessel. Bilateral testing is considered an integral part of Reis Pienta complete examination. Limited examinations for reoccurring indications may be performed as noted.  Right Carotid Findings: +----------+--------+--------+--------+------------------+---------+           PSV cm/sEDV cm/sStenosisPlaque DescriptionComments  +----------+--------+--------+--------+------------------+---------+ CCA Prox  90      15                                          +----------+--------+--------+--------+------------------+---------+ CCA Distal72      12                                          +----------+--------+--------+--------+------------------+---------+ ICA Prox  58      13      1-39%   calcific          Shadowing +----------+--------+--------+--------+------------------+---------+ ICA Mid   71      13                                          +----------+--------+--------+--------+------------------+---------+ ICA Distal77      19                                          +----------+--------+--------+--------+------------------+---------+ ECA       139     5                                           +----------+--------+--------+--------+------------------+---------+ +----------+--------+-------+--------+-------------------+           PSV cm/sEDV cmsDescribeArm Pressure (mmHG) +----------+--------+-------+--------+-------------------+ Subclavian117                                         +----------+--------+-------+--------+-------------------+ +---------+--------+--+--------+--+ VertebralPSV cm/s65EDV cm/s16 +---------+--------+--+--------+--+  Left Carotid Findings: +----------+--------+--------+--------+------------------+------------------+           PSV cm/sEDV cm/sStenosisPlaque DescriptionComments           +----------+--------+--------+--------+------------------+------------------+ CCA Prox  80      15                                intimal thickening +----------+--------+--------+--------+------------------+------------------+ CCA Distal93      11                                                   +----------+--------+--------+--------+------------------+------------------+ ICA Prox  87      17      1-39%   calcific                             +----------+--------+--------+--------+------------------+------------------+  ICA Mid   96      15                                                   +----------+--------+--------+--------+------------------+------------------+ ICA Distal66      13                                tortuous           +----------+--------+--------+--------+------------------+------------------+ ECA       142     12                                                   +----------+--------+--------+--------+------------------+------------------+ +----------+--------+--------+--------+-------------------+           PSV cm/sEDV cm/sDescribeArm Pressure (mmHG) +----------+--------+--------+--------+-------------------+ [id number]                                          +----------+--------+--------+--------+-------------------+ +---------+--------+--+--------+--+ VertebralPSV cm/s63EDV cm/s13 +---------+--------+--+--------+--+   Summary: Right Carotid: Velocities in the right ICA are consistent with Terald Jump 1-39% stenosis. Left Carotid: Velocities in the left ICA are consistent  with Kayston Jodoin 1-39% stenosis. Vertebrals:  Bilateral vertebral arteries demonstrate antegrade flow. Subclavians: Normal flow hemodynamics were seen in bilateral subclavian              arteries. *See table(s) above for measurements and observations.     Preliminary    ECHOCARDIOGRAM COMPLETE Result Date: 10/23/2023    ECHOCARDIOGRAM REPORT   Patient Name:   CAOIMHE DAMRON Date of Exam: 10/23/2023 Medical Rec #:  [id number]          Height:       65.0 in Accession #:    [id number]         Weight:       215.0 lb Date of Birth:  [birth year]          BSA:          2.040 m Patient Age:    72 years           BP:           188/69 mmHg Patient Gender: F                  HR:           68 bpm. Exam Location:  Inpatient Procedure: 2D Echo, Cardiac Doppler and Color Doppler (Both Spectral and Color            Flow Doppler were utilized during procedure). Indications:    Stroke  History:        Patient has prior history of Echocardiogram examinations, most                 recent 01/03/2020. CAD; Risk Factors:Hypertension.  Sonographer:    Janette Medley Referring Phys: Althia Atlas IMPRESSIONS  1. Left ventricular ejection fraction, by estimation, is 55 to 60%. The left ventricle has normal function. The left ventricle has no regional wall motion abnormalities. There is mild left ventricular hypertrophy. Left ventricular diastolic  parameters are consistent with Grade II diastolic dysfunction (pseudonormalization).  2. Right ventricular systolic function is normal. The right ventricular size is normal. Tricuspid regurgitation signal is inadequate for assessing PA pressure.  3. Left atrial size was mildly dilated.  4. The mitral valve is degenerative. Mild to moderate mitral valve regurgitation. No evidence of mitral stenosis.  5. The aortic valve was not well visualized. Aortic valve regurgitation is trivial. No aortic stenosis is present.  6. The inferior vena cava is normal in size with <50% respiratory variability, suggesting right  atrial pressure of 8 mmHg.  7. Cannot exclude Zlaty Alexa small PFO. Conclusion(s)/Recommendation(s): No intracardiac source of embolism detected on this transthoracic study. Consider Phil Michels transesophageal echocardiogram to exclude cardiac source of embolism if clinically indicated. FINDINGS  Left Ventricle: Left ventricular ejection fraction, by estimation, is 55 to 60%. The left ventricle has normal function. The left ventricle has no regional wall motion abnormalities. The left ventricular internal cavity size was normal in size. There is  mild left ventricular hypertrophy. Left ventricular diastolic parameters are consistent with Grade II diastolic dysfunction (pseudonormalization). Right Ventricle: The right ventricular size is normal. No increase in right ventricular wall thickness. Right ventricular systolic function is normal. Tricuspid regurgitation signal is inadequate for assessing PA pressure. Left Atrium: Left atrial size was mildly dilated. Right Atrium: Right atrial size was normal in size. Pericardium: There is no evidence of pericardial effusion. Mitral Valve: The mitral valve is degenerative in appearance. Mild to moderate mitral valve regurgitation. No evidence of mitral valve stenosis. Tricuspid Valve: The tricuspid valve is normal in structure. Tricuspid valve regurgitation is trivial. No evidence of tricuspid stenosis. Aortic Valve: The aortic valve was not well visualized. Aortic valve regurgitation is trivial. No aortic stenosis is present. Aortic valve mean gradient measures 6.5 mmHg. Aortic valve peak gradient measures 13.7 mmHg. Aortic valve area, by VTI measures 1.81 cm. Pulmonic Valve: The pulmonic valve was normal in structure. Pulmonic valve regurgitation is trivial. No evidence of pulmonic stenosis. Aorta: The aortic root is normal in size and structure. Venous: The inferior vena cava is normal in size with less than 50% respiratory variability, suggesting right atrial pressure of 8 mmHg.  IAS/Shunts: Cannot exclude Lakin Romer small PFO.  LEFT VENTRICLE PLAX 2D LVIDd:         5.20 cm   Diastology LVIDs:         3.30 cm   LV e' medial:    6.53 cm/s LV PW:         1.10 cm   LV E/e' medial:  28.9 LV IVS:        1.10 cm   LV e' lateral:   7.51 cm/s LVOT diam:     1.80 cm   LV E/e' lateral: 25.2 LV SV:         73 LV SV Index:   36 LVOT Area:     2.54 cm  RIGHT VENTRICLE             IVC RV S prime:     10.60 cm/s  IVC diam: 1.90 cm TAPSE (M-mode): 2.2 cm LEFT ATRIUM           Index        RIGHT ATRIUM           Index LA Vol (A2C): 54.5 ml 26.72 ml/m  RA Area:     13.00 cm LA Vol (A4C): 52.6 ml 25.79 ml/m  RA Volume:   27.40 ml  13.43 ml/m  AORTIC VALVE AV Area (Vmax):    1.66 cm AV Area (Vmean):   1.74 cm AV Area (VTI):     1.81 cm AV Vmax:           185.37 cm/s AV Vmean:          119.814 cm/s AV VTI:            0.405 m AV Peak Grad:      13.7 mmHg AV Mean Grad:      6.5 mmHg LVOT Vmax:         121.00 cm/s LVOT Vmean:        82.100 cm/s LVOT VTI:          0.288 m LVOT/AV VTI ratio: 0.71  AORTA Ao Root diam: 2.30 cm Ao Asc diam:  3.00 cm MITRAL VALVE MV Area (PHT): 5.02 cm     SHUNTS MV Decel Time: 151 msec     Systemic VTI:  0.29 m MV E velocity: 189.00 cm/s  Systemic Diam: 1.80 cm MV Daisy Lites velocity: 141.00 cm/s MV E/Nguyen Butler ratio:  1.34 Grady Lawman MD Electronically signed by Grady Lawman MD Signature Date/Time: 10/23/2023/12:30:25 PM    Final    MR ANGIO HEAD WO CONTRAST Result Date: 10/22/2023 CLINICAL DATA:  Initial evaluation for acute neuro deficit, stroke. EXAM: MRA HEAD WITHOUT CONTRAST TECHNIQUE: Angiographic images of the Circle of Willis were acquired using MRA technique without intravenous contrast. COMPARISON:  Brain MRI from earlier the same day. FINDINGS: Anterior circulation: Examination mildly degraded by motion artifact. Both internal carotid arteries are patent through the siphons without hemodynamically significant stenosis. 3 mm laterally directed outpouching arising from the cavernous  right ICA consistent with Ronnita Paz small aneurysm (series 5, image 102). A1 segments, anterior communicating artery complex, and anterior cerebral arteries patent without stenosis. No M1 stenosis or occlusion. Distal MCA branches perfused and symmetric. Posterior circulation: Both vertebral arteries are patent to the vertebrobasilar junction without stenosis. Left vertebral artery slightly dominant. Both PICA patent. Basilar patent without stenosis. Superior cerebral arteries patent bilaterally. Right PCA primarily supplied via the basilar, although Abra Lingenfelter small right posterior communicating artery noted. Fetal type origin of the left PCA. Both PCAs patent to their distal aspects without significant stenosis. Anatomic variants: Fetal type left PCA. Other: None. IMPRESSION: 1. Negative intracranial MRA for large vessel occlusion or other emergent finding. No hemodynamically significant or correctable stenosis. 2. 3 mm laterally directed aneurysm arising from the cavernous right ICA. Electronically Signed   By: Virgia Griffins M.D.   On: 10/22/2023 21:21   MR BRAIN WO CONTRAST Result Date: 10/22/2023 CLINICAL DATA:  Provided history: Neuro deficit, acute, stroke suspected. EXAM: MRI HEAD WITHOUT CONTRAST TECHNIQUE: Multiplanar, multiecho pulse sequences of the brain and surrounding structures were obtained without intravenous contrast. COMPARISON:  Head CT 09/19/2014. FINDINGS: Brain: No age-advanced or lobar predominant cerebral atrophy. 2 mm acute infarct within the mid-to-posterior left frontal lobe white matter (centrum semiovale) (series 2, image 36). Two acute infarcts within the right cingulate gyrus measuring up to 6 mm (series 2, image 35) (series 4, image 18). Multifocal T2 FLAIR hyperintense signal abnormality within the cerebral white matter and pons, nonspecific but compatible with moderate chronic small vessel ischemic disease. Punctate chronic microhemorrhage within the left occipital lobe. No evidence of  an intracranial mass. No extra-axial fluid collection. No midline shift. Vascular: Maintained flow voids within the proximal large arterial vessels. Skull and upper cervical spine: No focal worrisome marrow lesion. Incompletely assessed cervical spondylosis. Sinuses/Orbits:  No mass or acute finding within the imaged orbits. No significant paranasal sinus disease. IMPRESSION: 1. Two small acute infarcts within the right cingulate gyrus (measuring up to 6 mm). 2. 2 mm acute infarct within mid-to-posterior left frontal lobe white matter. 3. Moderate background chronic small vessel ischemic changes within the cerebral white matter and pons. Electronically Signed   By: Bascom Lily D.O.   On: 10/22/2023 16:57   DG Hip Unilat W or Wo Pelvis 2-3 Views Left Result Date: 10/22/2023 CLINICAL DATA:  Left leg numbness and weakness. EXAM: DG HIP (WITH OR WITHOUT PELVIS) 2-3V LEFT COMPARISON:  CT abdomen/pelvis dated 03/04/2021. FINDINGS: No acute fracture or dislocation. Femoral heads are seated within the acetabula. Mild osteoarthritis of the bilateral hips. Sacroiliac joints are anatomically aligned with degenerative changes. Pubic symphysis is anatomically aligned. Degenerative changes of the visualized lower lumbar spine. IMPRESSION: 1. No acute osseous abnormality. 2. Mild osteoarthritis of the bilateral hips. Electronically Signed   By: Mannie Seek M.D.   On: 10/22/2023 14:31    Microbiology: No results found for this or any previous visit (from the past 240 hours).   Labs: Basic Metabolic Panel: Recent Labs  Lab 10/22/23 1242 10/23/23 0514  NA 141 140  K 4.2 3.8  CL 109 110  CO2 21* 23  GLUCOSE 127* 108*  BUN 19 20  CREATININE 1.27* 1.46*  CALCIUM  9.3 9.1  MG  --  1.8  PHOS  --  3.8   Liver Function Tests: No results for input(s): AST, ALT, ALKPHOS, BILITOT, PROT, ALBUMIN  in the last 168 hours. No results for input(s): LIPASE, AMYLASE in the last 168 hours. No results for  input(s): AMMONIA in the last 168 hours. CBC: Recent Labs  Lab 10/22/23 1242 10/23/23 0514  WBC 8.7 7.9  HGB 13.1 12.1  HCT 43.4 40.0  MCV 89.9 89.7  PLT 202 179   Cardiac Enzymes: No results for input(s): CKTOTAL, CKMB, CKMBINDEX, TROPONINI in the last 168 hours. BNP: BNP (last 3 results) No results for input(s): BNP in the last 8760 hours.  ProBNP (last 3 results) No results for input(s): PROBNP in the last 8760 hours.  CBG: No results for input(s): GLUCAP in the last 168 hours.     Signed:  Donnetta Gains MD.  [facility] 10/23/2023, 6:08 PM

## 2023-10-26 ENCOUNTER — Ambulatory Visit: Payer: Self-pay | Admitting: Family Medicine

## 2023-11-22 DIAGNOSIS — I770 Arteriovenous fistula, acquired: Secondary | ICD-10-CM | POA: Diagnosis not present

## 2023-11-22 DIAGNOSIS — Z940 Kidney transplant status: Secondary | ICD-10-CM | POA: Diagnosis not present

## 2023-11-22 DIAGNOSIS — D631 Anemia in chronic kidney disease: Secondary | ICD-10-CM | POA: Diagnosis not present

## 2023-11-22 DIAGNOSIS — Z79899 Other long term (current) drug therapy: Secondary | ICD-10-CM | POA: Diagnosis not present

## 2023-11-22 DIAGNOSIS — I639 Cerebral infarction, unspecified: Secondary | ICD-10-CM | POA: Diagnosis not present

## 2023-11-22 DIAGNOSIS — I5189 Other ill-defined heart diseases: Secondary | ICD-10-CM | POA: Diagnosis not present

## 2023-11-22 DIAGNOSIS — M109 Gout, unspecified: Secondary | ICD-10-CM | POA: Diagnosis not present

## 2023-11-22 DIAGNOSIS — E785 Hyperlipidemia, unspecified: Secondary | ICD-10-CM | POA: Diagnosis not present

## 2023-11-22 DIAGNOSIS — I129 Hypertensive chronic kidney disease with stage 1 through stage 4 chronic kidney disease, or unspecified chronic kidney disease: Secondary | ICD-10-CM | POA: Diagnosis not present

## 2023-11-29 ENCOUNTER — Ambulatory Visit: Attending: Cardiology | Admitting: Cardiology

## 2023-11-29 ENCOUNTER — Encounter: Payer: Self-pay | Admitting: Cardiology

## 2023-11-29 VITALS — BP 148/82 | HR 76 | Ht 65.0 in | Wt 206.0 lb

## 2023-11-29 DIAGNOSIS — N186 End stage renal disease: Secondary | ICD-10-CM | POA: Diagnosis not present

## 2023-11-29 DIAGNOSIS — Z940 Kidney transplant status: Secondary | ICD-10-CM | POA: Diagnosis not present

## 2023-11-29 DIAGNOSIS — I2510 Atherosclerotic heart disease of native coronary artery without angina pectoris: Secondary | ICD-10-CM

## 2023-11-29 DIAGNOSIS — I480 Paroxysmal atrial fibrillation: Secondary | ICD-10-CM

## 2023-11-29 DIAGNOSIS — N189 Chronic kidney disease, unspecified: Secondary | ICD-10-CM | POA: Diagnosis not present

## 2023-11-29 NOTE — Patient Instructions (Signed)
 Medication Instructions:  Your physician recommends that you continue on your current medications as directed. Please refer to the Current Medication list given to you today.  *If you need a refill on your cardiac medications before your next appointment, please call your pharmacy*  Lab Work: None ordered.  If you have labs (blood work) drawn today and your tests are completely normal, you will receive your results only by: MyChart Message (if you have MyChart) OR A paper copy in the mail If you have any lab test that is abnormal or we need to change your treatment, we will call you to review the results.  Testing/Procedures: None ordered.   Follow-Up: At [facility], you and your health needs are our priority.  As part of our continuing mission to provide you with exceptional heart care, our providers are all part of one team.  This team includes your primary Cardiologist (physician) and Advanced Practice Providers or APPs (Physician Assistants and Nurse Practitioners) who all work together to provide you with the care you need, when you need it.  Your next appointment:   !2 months with Dr Jeffrie APP.

## 2023-11-29 NOTE — Progress Notes (Signed)
 Cardiology Office Note:  .   Date:  11/29/2023  ID:  Brittany Charles, DOB 1950-06-21, MRN [id number] PCP: Levora Reyes SAUNDERS, MD  [facility] Providers Cardiologist:  Oneil Parchment, MD    History of Present Illness: .   Brittany Charles is a 73 y.o. female Discussed the use of AI scribe software for clinical note transcription with the patient, who gave verbal consent to proceed.  History of Present Illness Brittany Charles is a 73 year old female with coronary artery disease and atrial fibrillation who presents for cardiovascular follow-up.  She was discharged from the hospital on October 23, 2023, after experiencing an acute cerebrovascular accident (CVA). She experienced left lower extremity weakness while walking her dog, which led to the hospital admission. An MRI conducted in June 2025 revealed two small infarcts within the right cingulate gyrus and a two-millimeter acute infarct within the mid to posterior left frontal lobe white matter.  Her past medical history includes coronary artery disease, hypertension, diastolic dysfunction, paroxysmal atrial fibrillation, supraventricular tachycardia (SVT), gout, lupus, and end-stage renal disease. She has a history of hemodialysis and underwent a renal transplant on November 21, 2021. She is currently on immunosuppressants including tacrolimus , mycophenolate , and prednisone , as well as Bactrim  three times a week for her renal transplant.  For her atrial fibrillation, she is on Eliquis  5 mg twice a day, Cardizem , and Toprol  XL. Her blood pressure management includes diltiazem  XR 180 mg once a day and hydralazine  50 mg three times a day. She also takes Crestor , which was increased to 40 mg for hyperlipidemia, with an LDL level of 77. Her creatinine level is 1.46, and she takes furosemide  80 mg as needed for swelling.  A carotid ultrasound showed 1-39% bilateral stenosis. An echocardiogram showed an ejection fraction of 55-60% with diastolic  dysfunction. Her hemoglobin A1c is 6.3. A prior cardiac catheterization in 2012 showed mild to moderate LAD stenosis with no significant RCA or circumflex stenosis.  No chest pain, shortness of breath, palpitations, or skipped beats. She recalls having a heart monitor placed in 2021, and her carotid arteries were tested, showing no significant blockage. Her echocardiogram and heart ultrasound were also noted to be good.      ROS: No CP, no Palps  Studies Reviewed: .        Results LABS LDL: 77 (10/2023) HbA1c: 6.3 (10/2023) Creatinine: 1.46 (10/2023)  RADIOLOGY MRI: Two small infarcts within the right cingulate gyrus. Two millimeter acute infarct within the mid to posterior left frontal lobe white matter. (10/2023) Carotid ultrasound: 1-39% bilateral stenosis minimal. (10/2023)  DIAGNOSTIC Echocardiogram: Ejection fraction 55-60% with diastolic dysfunction. (10/2023) ECG: Normal rhythm with occasional premature ventricular contractions and poor R-wave progression. (10/2023) Risk Assessment/Calculations:           Physical Exam:   VS:  BP (!) 148/82   Pulse 76   Ht 5' 5 (1.651 m)   Wt 206 lb (93.4 kg)   SpO2 96%   BMI 34.28 kg/m    Wt Readings from Last 3 Encounters:  11/29/23 206 lb (93.4 kg)  10/22/23 215 lb (97.5 kg)  08/27/22 207 lb (93.9 kg)    GEN: Well nourished, well developed in no acute distress NECK: No JVD; No carotid bruits CARDIAC: RRR, no murmurs, no rubs, no gallops RESPIRATORY:  Clear to auscultation without rales, wheezing or rhonchi  ABDOMEN: Soft, non-tender, non-distended EXTREMITIES:  No edema; No deformity   ASSESSMENT AND PLAN: .    Assessment  and Plan Assessment & Plan Acute CVA Recent acute CVA in June 2025 with left lower extremity weakness. MRI revealed two small infarcts within the right cingulate gyrus and a 2 mm acute infarct within the mid to posterior left frontal lobe white matter. Neurology suspected atrial fibrillation as the  cause.  Paroxysmal atrial fibrillation Paroxysmal atrial fibrillation managed with Eliquis , Cardizem , and Toprol  XL. No recent symptoms of chest pain, shortness of breath, or palpitations. EKG from June 2025 showed normal rhythm with a couple of PVCs and poor RA progression. Echocardiogram showed EF 55-60% with diastolic dysfunction. - Continue Eliquis  5 mg twice daily - Continue Cardizem  and Toprol  XL  Hypertension Blood pressure is elevated. Managed with diltiazem  XR and hydralazine . - Continue diltiazem  XR 180 mg daily - Continue hydralazine  50 mg three times daily  Hyperlipidemia LDL cholesterol level is 77. Crestor  dosage increased to 40 mg to achieve LDL below 70. - Continue Crestor  40 mg daily  End stage renal disease, status post renal transplant Status post renal transplant on November 21, 2021. On immunosuppressants including tacrolimus , mycophenolate , and prednisone , as well as Bactrim . Creatinine levels 1.3-1.5. - Continue current immunosuppressive regimen         Dispo: 1 yr APP  Signed, Oneil Parchment, MD

## 2024-03-03 DIAGNOSIS — E669 Obesity, unspecified: Secondary | ICD-10-CM | POA: Diagnosis not present

## 2024-03-03 DIAGNOSIS — I770 Arteriovenous fistula, acquired: Secondary | ICD-10-CM | POA: Diagnosis not present

## 2024-03-03 DIAGNOSIS — Z940 Kidney transplant status: Secondary | ICD-10-CM | POA: Diagnosis not present

## 2024-03-03 DIAGNOSIS — Z79899 Other long term (current) drug therapy: Secondary | ICD-10-CM | POA: Diagnosis not present

## 2024-03-03 DIAGNOSIS — D631 Anemia in chronic kidney disease: Secondary | ICD-10-CM | POA: Diagnosis not present

## 2024-03-03 DIAGNOSIS — E785 Hyperlipidemia, unspecified: Secondary | ICD-10-CM | POA: Diagnosis not present

## 2024-03-03 DIAGNOSIS — I129 Hypertensive chronic kidney disease with stage 1 through stage 4 chronic kidney disease, or unspecified chronic kidney disease: Secondary | ICD-10-CM | POA: Diagnosis not present

## 2024-03-03 DIAGNOSIS — M109 Gout, unspecified: Secondary | ICD-10-CM | POA: Diagnosis not present

## 2024-03-03 DIAGNOSIS — I5189 Other ill-defined heart diseases: Secondary | ICD-10-CM | POA: Diagnosis not present

## 2024-03-10 DIAGNOSIS — N186 End stage renal disease: Secondary | ICD-10-CM | POA: Diagnosis not present

## 2024-03-10 DIAGNOSIS — Z940 Kidney transplant status: Secondary | ICD-10-CM | POA: Diagnosis not present

## 2024-03-10 DIAGNOSIS — Z79899 Other long term (current) drug therapy: Secondary | ICD-10-CM | POA: Diagnosis not present

## 2024-03-11 ENCOUNTER — Other Ambulatory Visit: Payer: Self-pay | Admitting: Cardiology

## 2024-03-11 DIAGNOSIS — I480 Paroxysmal atrial fibrillation: Secondary | ICD-10-CM

## 2024-03-13 NOTE — Telephone Encounter (Signed)
 Pt last saw Dr Jeffrie 11/29/23, last labs 10/23/23 Creat 1.46, age 73, weight 93.4kg, based on specified criteria pt is on appropriate dosage of Eliquis  5mg  BID for afib. Will refill rx.

## 2024-04-14 ENCOUNTER — Observation Stay (HOSPITAL_COMMUNITY)
Admission: EM | Admit: 2024-04-14 | Discharge: 2024-04-16 | Disposition: A | Attending: Internal Medicine | Admitting: Internal Medicine

## 2024-04-14 ENCOUNTER — Encounter (HOSPITAL_COMMUNITY): Payer: Self-pay | Admitting: Emergency Medicine

## 2024-04-14 ENCOUNTER — Other Ambulatory Visit: Payer: Self-pay

## 2024-04-14 DIAGNOSIS — R7989 Other specified abnormal findings of blood chemistry: Secondary | ICD-10-CM | POA: Insufficient documentation

## 2024-04-14 DIAGNOSIS — R531 Weakness: Secondary | ICD-10-CM

## 2024-04-14 DIAGNOSIS — M109 Gout, unspecified: Secondary | ICD-10-CM | POA: Diagnosis present

## 2024-04-14 DIAGNOSIS — I480 Paroxysmal atrial fibrillation: Secondary | ICD-10-CM

## 2024-04-14 DIAGNOSIS — I130 Hypertensive heart and chronic kidney disease with heart failure and stage 1 through stage 4 chronic kidney disease, or unspecified chronic kidney disease: Secondary | ICD-10-CM | POA: Insufficient documentation

## 2024-04-14 DIAGNOSIS — R0609 Other forms of dyspnea: Principal | ICD-10-CM

## 2024-04-14 DIAGNOSIS — I10 Essential (primary) hypertension: Secondary | ICD-10-CM | POA: Diagnosis present

## 2024-04-14 DIAGNOSIS — Z7982 Long term (current) use of aspirin: Secondary | ICD-10-CM | POA: Insufficient documentation

## 2024-04-14 DIAGNOSIS — R079 Chest pain, unspecified: Secondary | ICD-10-CM | POA: Diagnosis present

## 2024-04-14 DIAGNOSIS — N179 Acute kidney failure, unspecified: Secondary | ICD-10-CM

## 2024-04-14 DIAGNOSIS — Z79899 Other long term (current) drug therapy: Secondary | ICD-10-CM | POA: Insufficient documentation

## 2024-04-14 DIAGNOSIS — Z8673 Personal history of transient ischemic attack (TIA), and cerebral infarction without residual deficits: Secondary | ICD-10-CM | POA: Insufficient documentation

## 2024-04-14 DIAGNOSIS — N1832 Chronic kidney disease, stage 3b: Secondary | ICD-10-CM | POA: Insufficient documentation

## 2024-04-14 DIAGNOSIS — R0602 Shortness of breath: Secondary | ICD-10-CM | POA: Diagnosis not present

## 2024-04-14 DIAGNOSIS — Z940 Kidney transplant status: Secondary | ICD-10-CM | POA: Insufficient documentation

## 2024-04-14 DIAGNOSIS — I2510 Atherosclerotic heart disease of native coronary artery without angina pectoris: Secondary | ICD-10-CM | POA: Diagnosis present

## 2024-04-14 DIAGNOSIS — I5032 Chronic diastolic (congestive) heart failure: Secondary | ICD-10-CM

## 2024-04-14 DIAGNOSIS — R0789 Other chest pain: Principal | ICD-10-CM | POA: Insufficient documentation

## 2024-04-14 DIAGNOSIS — E782 Mixed hyperlipidemia: Secondary | ICD-10-CM | POA: Diagnosis present

## 2024-04-14 LAB — CBC
HCT: 40.3 % (ref 36.0–46.0)
Hemoglobin: 12.3 g/dL (ref 12.0–15.0)
MCH: 27.8 pg (ref 26.0–34.0)
MCHC: 30.5 g/dL (ref 30.0–36.0)
MCV: 91.2 fL (ref 80.0–100.0)
Platelets: 240 K/uL (ref 150–400)
RBC: 4.42 MIL/uL (ref 3.87–5.11)
RDW: 15.3 % (ref 11.5–15.5)
WBC: 5.8 K/uL (ref 4.0–10.5)
nRBC: 0 % (ref 0.0–0.2)

## 2024-04-14 NOTE — ED Triage Notes (Signed)
 Patient c/o SOB and weakness x 3 days. Patient denies Chest pain. Patient report nausea denies vomiting and diarrhea.  Hx CHF

## 2024-04-15 ENCOUNTER — Emergency Department (HOSPITAL_COMMUNITY)

## 2024-04-15 DIAGNOSIS — R0609 Other forms of dyspnea: Secondary | ICD-10-CM | POA: Diagnosis not present

## 2024-04-15 DIAGNOSIS — I480 Paroxysmal atrial fibrillation: Secondary | ICD-10-CM

## 2024-04-15 DIAGNOSIS — N179 Acute kidney failure, unspecified: Secondary | ICD-10-CM

## 2024-04-15 DIAGNOSIS — I5032 Chronic diastolic (congestive) heart failure: Secondary | ICD-10-CM

## 2024-04-15 DIAGNOSIS — I517 Cardiomegaly: Secondary | ICD-10-CM | POA: Diagnosis not present

## 2024-04-15 DIAGNOSIS — R0602 Shortness of breath: Secondary | ICD-10-CM | POA: Diagnosis not present

## 2024-04-15 DIAGNOSIS — R531 Weakness: Secondary | ICD-10-CM

## 2024-04-15 DIAGNOSIS — R0789 Other chest pain: Secondary | ICD-10-CM | POA: Diagnosis not present

## 2024-04-15 DIAGNOSIS — Z8673 Personal history of transient ischemic attack (TIA), and cerebral infarction without residual deficits: Secondary | ICD-10-CM

## 2024-04-15 DIAGNOSIS — R079 Chest pain, unspecified: Secondary | ICD-10-CM | POA: Diagnosis present

## 2024-04-15 LAB — PHOSPHORUS: Phosphorus: 3.7 mg/dL (ref 2.5–4.6)

## 2024-04-15 LAB — ECHOCARDIOGRAM COMPLETE
Area-P 1/2: 3.61 cm2
Calc EF: 55.2 %
Height: 65 in
S' Lateral: 2.9 cm
Single Plane A2C EF: 48.4 %
Single Plane A4C EF: 62.6 %
Weight: 3315.72 [oz_av]

## 2024-04-15 LAB — URINALYSIS, ROUTINE W REFLEX MICROSCOPIC
Bacteria, UA: NONE SEEN
Bilirubin Urine: NEGATIVE
Glucose, UA: NEGATIVE mg/dL
Hgb urine dipstick: NEGATIVE
Ketones, ur: NEGATIVE mg/dL
Leukocytes,Ua: NEGATIVE
Nitrite: NEGATIVE
Protein, ur: 100 mg/dL — AB
Specific Gravity, Urine: 1.023 (ref 1.005–1.030)
pH: 5 (ref 5.0–8.0)

## 2024-04-15 LAB — BASIC METABOLIC PANEL WITH GFR
Anion gap: 13 (ref 5–15)
Anion gap: 13 (ref 5–15)
BUN: 16 mg/dL (ref 8–23)
BUN: 17 mg/dL (ref 8–23)
CO2: 17 mmol/L — ABNORMAL LOW (ref 22–32)
CO2: 19 mmol/L — ABNORMAL LOW (ref 22–32)
Calcium: 9.4 mg/dL (ref 8.9–10.3)
Calcium: 9.7 mg/dL (ref 8.9–10.3)
Chloride: 110 mmol/L (ref 98–111)
Chloride: 110 mmol/L (ref 98–111)
Creatinine, Ser: 1.75 mg/dL — ABNORMAL HIGH (ref 0.44–1.00)
Creatinine, Ser: 1.84 mg/dL — ABNORMAL HIGH (ref 0.44–1.00)
GFR, Estimated: 29 mL/min — ABNORMAL LOW (ref >60)
GFR, Estimated: 30 mL/min — ABNORMAL LOW (ref >60)
Glucose, Bld: 104 mg/dL — ABNORMAL HIGH (ref 70–99)
Glucose, Bld: 99 mg/dL (ref 70–99)
Potassium: 4 mmol/L (ref 3.5–5.1)
Potassium: 4.4 mmol/L (ref 3.5–5.1)
Sodium: 140 mmol/L (ref 135–145)
Sodium: 141 mmol/L (ref 135–145)

## 2024-04-15 LAB — RESP PANEL BY RT-PCR (RSV, FLU A&B, COVID)  RVPGX2
Influenza A by PCR: NEGATIVE
Influenza B by PCR: NEGATIVE
Resp Syncytial Virus by PCR: NEGATIVE
SARS Coronavirus 2 by RT PCR: NEGATIVE

## 2024-04-15 LAB — PRO BRAIN NATRIURETIC PEPTIDE: Pro Brain Natriuretic Peptide: 469 pg/mL — ABNORMAL HIGH (ref <300.0)

## 2024-04-15 LAB — HEPATIC FUNCTION PANEL
ALT: 10 U/L (ref 0–44)
AST: 19 U/L (ref 15–41)
Albumin: 3.7 g/dL (ref 3.5–5.0)
Alkaline Phosphatase: 85 U/L (ref 38–126)
Bilirubin, Direct: 0.2 mg/dL (ref 0.0–0.2)
Indirect Bilirubin: 0.2 mg/dL — ABNORMAL LOW (ref 0.3–0.9)
Total Bilirubin: 0.4 mg/dL (ref 0.0–1.2)
Total Protein: 6.3 g/dL — ABNORMAL LOW (ref 6.5–8.1)

## 2024-04-15 LAB — D-DIMER, QUANTITATIVE: D-Dimer, Quant: 0.38 ug{FEU}/mL (ref 0.00–0.50)

## 2024-04-15 LAB — TROPONIN T, HIGH SENSITIVITY
Troponin T High Sensitivity: 45 ng/L — ABNORMAL HIGH (ref 0–19)
Troponin T High Sensitivity: 46 ng/L — ABNORMAL HIGH (ref 0–19)
Troponin T High Sensitivity: 41 ng/L — ABNORMAL HIGH (ref 0–19)

## 2024-04-15 LAB — CK: Total CK: 67 U/L (ref 38–234)

## 2024-04-15 LAB — TSH: TSH: 1.38 u[IU]/mL (ref 0.350–4.500)

## 2024-04-15 LAB — VITAMIN B12: Vitamin B-12: 753 pg/mL (ref 180–914)

## 2024-04-15 LAB — VITAMIN D 25 HYDROXY (VIT D DEFICIENCY, FRACTURES): Vit D, 25-Hydroxy: 28.19 ng/mL — ABNORMAL LOW (ref 30–100)

## 2024-04-15 LAB — MAGNESIUM: Magnesium: 2 mg/dL (ref 1.7–2.4)

## 2024-04-15 MED ORDER — ORAL CARE MOUTH RINSE: 15.0000 mL | OROMUCOSAL | Status: DC | PRN

## 2024-04-15 MED ORDER — ALLOPURINOL 100 MG PO TABS
100.0000 mg | ORAL_TABLET | Freq: Every day | ORAL | Status: DC
  Administered 2024-04-15 – 2024-04-16 (×2): 100 mg via ORAL
  Filled 2024-04-15 (×2): qty 1

## 2024-04-15 MED ORDER — IPRATROPIUM-ALBUTEROL 0.5-2.5 (3) MG/3ML IN SOLN
3.0000 mL | Freq: Once | RESPIRATORY_TRACT | Status: AC
  Administered 2024-04-15: 3 mL via RESPIRATORY_TRACT
  Filled 2024-04-15: qty 3

## 2024-04-15 MED ORDER — APIXABAN 5 MG PO TABS
5.0000 mg | ORAL_TABLET | Freq: Two times a day (BID) | ORAL | Status: DC
  Administered 2024-04-15 – 2024-04-16 (×3): 5 mg via ORAL
  Filled 2024-04-15 (×3): qty 1

## 2024-04-15 MED ORDER — ROSUVASTATIN CALCIUM 20 MG PO TABS
40.0000 mg | ORAL_TABLET | Freq: Every day | ORAL | Status: DC
  Administered 2024-04-15: 40 mg via ORAL
  Filled 2024-04-15: qty 2

## 2024-04-15 MED ORDER — HYDRALAZINE HCL 20 MG/ML IJ SOLN
5.0000 mg | Freq: Three times a day (TID) | INTRAMUSCULAR | Status: DC | PRN
  Administered 2024-04-15 – 2024-04-16 (×2): 5 mg via INTRAVENOUS
  Filled 2024-04-15 (×2): qty 1

## 2024-04-15 MED ORDER — DILTIAZEM HCL ER COATED BEADS 180 MG PO CP24
180.0000 mg | ORAL_CAPSULE | Freq: Every day | ORAL | Status: DC
  Administered 2024-04-15 – 2024-04-16 (×2): 180 mg via ORAL
  Filled 2024-04-15 (×2): qty 1

## 2024-04-15 MED ORDER — ONDANSETRON HCL 4 MG/2ML IJ SOLN: 4.0000 mg | Freq: Four times a day (QID) | INTRAMUSCULAR | Status: DC | PRN

## 2024-04-15 MED ORDER — SODIUM CHLORIDE 0.9 % IV SOLN: INTRAVENOUS | Status: AC

## 2024-04-15 MED ORDER — MYCOPHENOLATE SODIUM 180 MG PO TBEC
180.0000 mg | DELAYED_RELEASE_TABLET | Freq: Two times a day (BID) | ORAL | Status: DC
  Administered 2024-04-15 – 2024-04-16 (×3): 180 mg via ORAL
  Filled 2024-04-15 (×3): qty 1

## 2024-04-15 MED ORDER — PREDNISONE 5 MG PO TABS
5.0000 mg | ORAL_TABLET | Freq: Every day | ORAL | Status: DC
  Administered 2024-04-15 – 2024-04-16 (×2): 5 mg via ORAL
  Filled 2024-04-15 (×2): qty 1

## 2024-04-15 MED ORDER — MYCOPHENOLATE SODIUM 180 MG PO TBEC
360.0000 mg | DELAYED_RELEASE_TABLET | Freq: Two times a day (BID) | ORAL | Status: DC
  Filled 2024-04-15: qty 2

## 2024-04-15 MED ORDER — PANTOPRAZOLE SODIUM 40 MG PO TBEC
40.0000 mg | DELAYED_RELEASE_TABLET | Freq: Every day | ORAL | Status: DC
  Administered 2024-04-15 – 2024-04-16 (×2): 40 mg via ORAL
  Filled 2024-04-15 (×2): qty 1

## 2024-04-15 MED ORDER — SODIUM CHLORIDE 0.9 % IV SOLN: INTRAVENOUS | Status: DC

## 2024-04-15 MED ORDER — ONDANSETRON HCL 4 MG PO TABS: 4.0000 mg | ORAL_TABLET | Freq: Four times a day (QID) | ORAL | Status: DC | PRN

## 2024-04-15 MED ORDER — HYDRALAZINE HCL 50 MG PO TABS
50.0000 mg | ORAL_TABLET | Freq: Three times a day (TID) | ORAL | Status: DC
  Administered 2024-04-15 – 2024-04-16 (×4): 50 mg via ORAL
  Filled 2024-04-15: qty 2
  Filled 2024-04-15 (×3): qty 1

## 2024-04-15 MED ORDER — TACROLIMUS 1 MG PO CAPS
2.0000 mg | ORAL_CAPSULE | Freq: Two times a day (BID) | ORAL | Status: DC
  Administered 2024-04-15 – 2024-04-16 (×3): 2 mg via ORAL
  Filled 2024-04-15 (×3): qty 2

## 2024-04-15 MED ORDER — ACETAMINOPHEN 325 MG PO TABS: 650.0000 mg | ORAL_TABLET | Freq: Four times a day (QID) | ORAL | Status: DC | PRN

## 2024-04-15 MED ORDER — SULFAMETHOXAZOLE-TRIMETHOPRIM 400-80 MG PO TABS: 1.0000 | ORAL_TABLET | ORAL | Status: DC

## 2024-04-15 MED ORDER — ACETAMINOPHEN 650 MG RE SUPP: 650.0000 mg | Freq: Four times a day (QID) | RECTAL | Status: DC | PRN

## 2024-04-15 NOTE — Assessment & Plan Note (Signed)
 Hx of stage V CKD secondary to lupus nephritis.  Now s/p renal transplant 11/20/2021, she completed a DDRT to the right pelvis Current immunosuppression regimen is: Prograf  2 mg BID, Myfortic  180 mg BID, and Prednisone  5 mg daily.  Bactrim  3x/week

## 2024-04-15 NOTE — Assessment & Plan Note (Signed)
 Continue allopurinol , no s/sx of flare

## 2024-04-15 NOTE — Assessment & Plan Note (Addendum)
 Hx of stage V CKD secondary to lupus nephritis.  Now s/p renal transplant 11/20/2021, she completed a DDRT to the right pelvis baseline creatinine 1.3-1.5 Current immunosuppression regimen is: Prograf  2 mg BID, Myfortic  180 mg BID, and Prednisone  5 mg daily.  Check UA Strict I/O and daily weights Has had poor PO intake over past 3 days and wonder if related to initiation of recent GLP-1 two weeks ago  Gentle IVF x 8 hours  Monitor nephrotoxic drugs Repeat bmp this AM and trend

## 2024-04-15 NOTE — Progress Notes (Signed)
  Echocardiogram 2D Echocardiogram has been performed.  Tinnie FORBES Gosling RDCS 04/15/2024, 1:56 PM

## 2024-04-15 NOTE — Assessment & Plan Note (Signed)
 She has normotensive pressures with low diastolic at time of admit with c/o lightheaded and dizziness on standing and overall weakness Check orthostatics, TED hose Hold cardura  Continue her diltiazem  and hydralazine   Gentle IVF

## 2024-04-15 NOTE — H&P (Signed)
 History and Physical    Patient: Brittany Charles FMW:[id number] DOB: [birth year] DOA: 04/14/2024 DOS: the patient was seen and examined on 04/15/2024 PCP: Levora Reyes SAUNDERS, MD  Patient coming from: Home - lives with her grandson. Uses walker and cane on occasion.    Chief Complaint: fatigue/weakness and shortness of breath   HPI: Brittany Charles is a 73 y.o. female with medical history significant of   coronary artery disease s/p PC1 2012, hypertension, diastolic dysfunction, paroxysmal atrial fibrillation on eliquis , supraventricular tachycardia (SVT), gout, lupus, and end-stage renal disease s/p renal transplant patient, hx of CVA presenting with 3 days of shortness of breath along, poor PO intake, weakness and complaints of pain in her shoulders.   She states over the last 3 days she has had no energy and been weak and has been unable to stand for prolonged periods of time. She had to take frequent breaks. She couldn't stand up to cook or clean which is abnormal for her. She also reports pain across the back of her shoulders anytime she walked or exerted herself, but no substernal chest pain. She also reports shortness of breath and dyspnea on exertion. She has mild swelling her in left leg which is new for her. She denies any orthopnea. She denies any cough. She has a prescription for as needed lasix  for swelling, but has not needed or taken this at all.   She denies any fever or chills or sick contacts. She has had poor PO Intake and poor water intake over the past few days. She has not urinated at all over past 24 hours. NO abdominal pain, CVA pain or N/V/D. She does have some indigestion. She has had some lightheadedness and dizziness on standing. Denies any unilateral weakness.   She also just started ozempic for obesity and has done 2 doses.     Denies any fever/chills, vision changes/headaches, chest pain or palpitations, cough, abdominal pain, N/V/D, dysuria.    She does not  smoke or drink alcohol.   ER Course:  vitals: afebrile, bp: 151/162, HR: 87, RR: 16, oxygen 95%RA Pertinent labs: creatinine: 1.84 (baseline: 1.2-1.4), pro-bnp: 469, troponin: 45->46 CXR: cardiomegaly, no acute findings In ED: cardiology consulted. Duoneb given. TRH asked to admit    Review of Systems: As mentioned in the history of present illness. All other systems reviewed and are negative. Past Medical History:  Diagnosis Date   Anemia    Arthritis    CHF (congestive heart failure) (HCC)    Colon polyps    COVID 2020   hospitalized at [facility]   Fibroids    GERD (gastroesophageal reflux disease)    Hypertension    Lupus (systemic lupus erythematosus) (HCC)    Myocardial infarction (HCC)    Pneumonia    Renal insufficiency    CKD stage 5- not on dialysis (07/08/21)   Past Surgical History:  Procedure Laterality Date   ABDOMINAL HYSTERECTOMY  10/27/2000   TAH, BSO   AV FISTULA PLACEMENT Left 05/27/2021   Procedure: LEFT BRACHIOCEPHALIC ARTERIOVENOUS (AV) FISTULA CREATION;  Surgeon: Lanis Fonda BRAVO, MD;  Location: [facility] OR;  Service: Vascular;  Laterality: Left;  with peripheral nerve block   BIOPSY  09/02/2021   Procedure: BIOPSY;  Surgeon: Shila Gustav GAILS, MD;  Location: [facility] ENDOSCOPY;  Service: Endoscopy;;   COLONOSCOPY WITH PROPOFOL  N/A 09/02/2021   Procedure: COLONOSCOPY WITH PROPOFOL ;  Surgeon: Shila Gustav GAILS, MD;  Location: [facility] ENDOSCOPY;  Service: Endoscopy;  Laterality: N/A;   LEFT  AND RIGHT HEART CATHETERIZATION WITH CORONARY ANGIOGRAM Right 04/21/2011   Procedure: LEFT AND RIGHT HEART CATHETERIZATION WITH CORONARY ANGIOGRAM;  Surgeon: Ozell Fell, MD;  Location: [facility] CATH LAB;  Service: Cardiovascular;  Laterality: Right;   LIGATION OF ARTERIOVENOUS  FISTULA Left 07/10/2021   Procedure: LEFT ARM BRANCH LIGATION OF ARTERIOVENOUS  FISTULA;  Surgeon: Lanis Fonda BRAVO, MD;  Location: [facility] OR;  Service: Vascular;  Laterality: Left;  PERIPHERAL NERVE BLOCK   POLYPECTOMY   09/02/2021   Procedure: POLYPECTOMY;  Surgeon: Shila Gustav GAILS, MD;  Location: [facility] ENDOSCOPY;  Service: Endoscopy;;   TONSILLECTOMY     Social History:  reports that she has never smoked. She has never used smokeless tobacco. She reports that she does not drink alcohol and does not use drugs.  Allergies  Allergen Reactions   Zithromax  [Azithromycin ] Nausea And Vomiting    Family History  Problem Relation Age of Onset   Hypertension Mother    Heart disease Mother        CHF   Stroke Mother    Diabetes Mother    Hyperlipidemia Father    Stroke Father    Hypertension Father    Asthma Brother    Kidney disease Brother    Stroke Maternal Grandmother    Colon cancer Other        several cousins   Breast cancer Other    Colon cancer Son    Hypertension Son     Prior to Admission medications   Medication Sig Start Date End Date Taking? Authorizing Provider  acetaminophen  (TYLENOL ) 325 MG tablet Take 650 mg by mouth every 6 (six) hours as needed for moderate pain.    [provider]  albuterol  (VENTOLIN  HFA) 108 (90 Base) MCG/ACT inhaler Inhale 2 puffs into the lungs every 6 (six) hours as needed for wheezing or shortness of breath. 01/04/20   Rai, Ripudeep K, MD  allopurinol  (ZYLOPRIM ) 100 MG tablet TAKE 1 TABLET EVERY DAY 10/10/15   Tish Alm DEL, MD  DILT-XR 180 MG 24 hr capsule Take 180 mg by mouth daily. 09/27/20   [provider]  doxazosin  (CARDURA ) 2 MG tablet Take 2 mg by mouth 2 (two) times daily. 11/06/20   [provider]  ELIQUIS  5 MG TABS tablet TAKE 1 TABLET(5 MG) BY MOUTH TWICE DAILY 03/13/24   Jeffrie Oneil BROCKS, MD  furosemide  (LASIX ) 80 MG tablet Take 80 mg by mouth daily as needed (swelling).    [provider]  hydrALAZINE  (APRESOLINE ) 50 MG tablet TAKE 1 TABLET THREE TIMES DAILY 10/10/15   Tish Alm DEL, MD  Menthol, Topical Analgesic, (BIOFREEZE) 4 % GEL Apply 1 application. topically 3 (three) times daily as needed (pain).     [provider]  mycophenolate  (MYFORTIC ) 180 MG EC tablet Take 360 mg by mouth 2 (two) times daily.    [provider]  pantoprazole  (PROTONIX ) 40 MG tablet Take 40 mg by mouth See admin instructions. Take one tablet by mouth on Monday, Wednesdays and Fridays only per patient    [provider]  predniSONE  (DELTASONE ) 5 MG tablet Take 5 mg by mouth daily. 08/25/23   [provider]  rosuvastatin  (CRESTOR ) 40 MG tablet Take 1 tablet (40 mg total) by mouth at bedtime. 10/23/23 11/29/23  Perri DELENA Meliton Mickey., MD  Semaglutide,0.25 or 0.5MG /DOS, 2 MG/3ML SOPN Inject 0.25 mg into the skin once a week. 07/28/23   [provider]  sulfamethoxazole -trimethoprim  (BACTRIM ) 400-80 MG tablet Take 1 tablet by  mouth See admin instructions. Take one tablet by mouth on Monday, Wednesdays and Fridays only per patient    [provider]  tacrolimus  (PROGRAF ) 1 MG capsule Take 2 mg by mouth 2 (two) times daily.    [provider]    Physical Exam: Vitals:   04/15/24 0530 04/15/24 0614 04/15/24 0822 04/15/24 0930  BP: (!) 145/55   (!) 167/57  Pulse: 79   78  Resp: 20   20  Temp:   98.1 F (36.7 C)   TempSrc:      SpO2: 97% 97%  96%  Weight:      Height:       General:  Appears calm and comfortable and is in NAD Eyes:  PERRL, EOMI, normal lids, iris ENT:  grossly normal hearing, lips & tongue, mmm; appropriate dentition Neck:  no LAD, masses or thyromegaly; no carotid bruits, no JVD  Cardiovascular:  RRR, no m/r/g. No LE edema.  Respiratory:   CTA bilaterally with no wheezes/rales/rhonchi.  Normal respiratory effort. Abdomen:  soft, NT, ND, NABS Back:   normal alignment, no CVAT Skin:  no rash or induration seen on limited exam Musculoskeletal:  grossly normal tone BUE/BLE, good ROM, no bony abnormality. Strength 5/5 bilaterally UE and LE  Lower extremity:  No LE edema.  Limited foot exam with no ulcerations.  2+ distal pulses. Psychiatric:   grossly normal mood and affect, speech fluent and appropriate, AOx3 Neurologic:  CN 2-12 grossly intact, moves all extremities in coordinated fashion, sensation intact   Radiological Exams on Admission: Independently reviewed - see discussion in A/P where applicable  DG Chest 2 View Result Date: 04/15/2024 EXAM: 2 VIEW(S) XRAY OF THE CHEST 04/15/2024 12:29:00 AM COMPARISON: 03/03/2021 CLINICAL HISTORY: SOB FINDINGS: LUNGS AND PLEURA: No focal pulmonary opacity. No pleural effusion. No pneumothorax. HEART AND MEDIASTINUM: Cardiomegaly. BONES AND SOFT TISSUES: No acute osseous abnormality. IMPRESSION: 1. Cardiomegaly. 2. No acute cardiopulmonary disease. Electronically signed by: Franky Crease MD 04/15/2024 12:41 AM EST RP Workstation: [id number]    EKG: Independently reviewed.  NSR with rate 81; nonspecific ST changes with no evidence of acute ischemia compared to baseline    Labs on Admission: I have personally reviewed the available labs and imaging studies at the time of the admission.  Pertinent labs:   creatinine: 1.84 (baseline: 1.2-1.4),  pro-bnp: 469,  troponin: 45->46  Assessment and Plan: Principal Problem:   atypical chest pain with elevated troponin Active Problems:   Acute renal failure superimposed on stage 3b chronic kidney disease (HCC)   Weakness   Essential hypertension   Paroxysmal atrial fibrillation (HCC)   History of CVA (cerebrovascular accident)   Chronic diastolic CHF (congestive heart failure) (HCC)   Mixed hyperlipidemia   Deceased-donor kidney transplant recipient   Gout   Coronary artery disease involving native coronary artery of native heart    Assessment and Plan: * atypical chest pain with elevated troponin 72 year old female presenting to ED with complains of shortness of breath and fatigue/weakness with pain in her shoulders  found to have elevated troponin in setting of known CAD s/p PCI in 2012 and diastolic CHF  -obs to tele -pain atypical,  not substernal and more in her back. Main complaint is fatigue/weakness -check echo -continue eliquis ,  and high intensity statin (crestor  increased to 40mg  in 11/2023 after acute CVA). No longer on Toprol .  -cardiology consulted by EDP  -CXR with no acute findings, d-dimer wnl  -trending troponin and flat and down  trending: 45>46>41  -w/u for weakness, see #3   Acute renal failure superimposed on stage 3b chronic kidney disease (HCC) Hx of stage V CKD secondary to lupus nephritis.  Now s/p renal transplant 11/20/2021, she completed a DDRT to the right pelvis baseline creatinine 1.3-1.5 Current immunosuppression regimen is: Prograf  2 mg BID, Myfortic  180 mg BID, and Prednisone  5 mg daily.  Check UA Strict I/O and daily weights Has had poor PO intake over past 3 days and wonder if related to initiation of recent GLP-1 two weeks ago  Gentle IVF x 8 hours  Monitor nephrotoxic drugs Repeat bmp this AM and trend   Weakness Main complaint is weakness over the past 3 days with poor PO intake with some positional lightheadedness and dizziness  Cardiac w/u pending  Check orthostatic blood pressures, but normotensive at time of admit TED hose and hold cardura   Check vitamin D , TSH, B12, mag and phos  Start gentle IVF  Unsure if related but just started ozempic 2 weeks ago. ? If poor PO intake overall contributing  PT eval   Essential hypertension She has normotensive pressures with low diastolic at time of admit with c/o lightheaded and dizziness on standing and overall weakness Check orthostatics, TED hose Hold cardura  Continue her diltiazem  and hydralazine   Gentle IVF   Paroxysmal atrial fibrillation (HCC) In NSR today  Eliquis  5 mg p.o. twice daily  Continue cardizem  180mg .  No longer on Toprol  XL   Chronic diastolic CHF (congestive heart failure) (HCC) Echo 10/2023 with normal EF and grade 2 DD No signs of CHF exacerbation on exam. She has no edema, weight gain, cough, JVD and  reports poor PO Intake x 3 days CXR with no edema or effusions Appears more dry Strict I/O and daily weights  Continue medical management   History of CVA (cerebrovascular accident) -10/2023: MRI with 2 small infarcts within the right cingulate gyrus, 2 mm acute infarct within mid to posterior L frontal lobe white matter. Suspect cardioembolic in setting of PAF  -continue eliquis /statin   Deceased-donor kidney transplant recipient Hx of stage V CKD secondary to lupus nephritis.  Now s/p renal transplant 11/20/2021, she completed a DDRT to the right pelvis Current immunosuppression regimen is: Prograf  2 mg BID, Myfortic  180 mg BID, and Prednisone  5 mg daily.  Bactrim  3x/week   Mixed hyperlipidemia Continue crestor  40mg  daily   Gout Continue allopurinol , no s/sx of flare     Advance Care Planning:   Code Status: Full Code     Consultants: Cardiology, PT    Procedures: Echo ordered 12/6    Antibiotics: None     DVT Prophylaxis: eliquis    Family Communication: none   Severity of Illness: The appropriate patient status for this patient is OBSERVATION. Observation status is judged to be reasonable and necessary in order to provide the required intensity of service to ensure the patient's safety. The patient's presenting symptoms, physical exam findings, and initial radiographic and laboratory data in the context of their medical condition is felt to place them at decreased risk for further clinical deterioration. Furthermore, it is anticipated that the patient will be medically stable for discharge from the hospital within 2 midnights of admission.   Author: Isaiah Geralds, MD 04/15/2024 10:31 AM  For on call review [email or web address].

## 2024-04-15 NOTE — Assessment & Plan Note (Addendum)
 Main complaint is weakness over the past 3 days with poor PO intake with some positional lightheadedness and dizziness  Cardiac w/u pending  Check orthostatic blood pressures, but normotensive at time of admit TED hose and hold cardura   Check vitamin D , TSH, B12, mag and phos  Start gentle IVF  Unsure if related but just started ozempic 2 weeks ago. ? If poor PO intake overall contributing  PT eval

## 2024-04-15 NOTE — Assessment & Plan Note (Signed)
 Echo 10/2023 with normal EF and grade 2 DD No signs of CHF exacerbation on exam. She has no edema, weight gain, cough, JVD and reports poor PO Intake x 3 days CXR with no edema or effusions Appears more dry Strict I/O and daily weights  Continue medical management

## 2024-04-15 NOTE — ED Provider Notes (Signed)
 [place] EMERGENCY DEPARTMENT AT [facility] Provider Note  CSN: [id number] Arrival date & time: 04/14/24 2323  Chief Complaint(s) Shortness of Breath  HPI Brittany Charles is a 73 y.o. female    The history is provided by the patient and medical records.    Clinical Course as of 04/15/24 9355  Sat Apr 15, 2024  0030 With a past medical history listed below including atrial fibrillation on Eliquis , diastolic heart failure with a last EF of 55 to 60% on as needed Lasix , SLE leading to ESRD previously on dialysis status post renal transplant on Prograf  and Myfortic .  She presents for 3 days of gradually worsening dyspnea on exertion with associated upper back pain (described as aching between the shoulder blades) that radiates to the left arm.  Pain and shortness of breath are only with exertion.  Improves with rest.  Patient has not taken any nitroglycerin or Lasix .  Denies any recent fevers or infections.  No coughing or congestion.  No overt chest pain.  No abdominal pain.  No peripheral edema.  No prior history of DVT/PE.  No prolonged immobilization.   [PC]  0031 Differential diagnosis considered and workup below.  [PC]  [id number] ED EKG Normal sinus rhythm without acute ischemic changes, no evidence of pericarditis, no dysrhythmias or blocks.  [PC]  0045 DG Chest 2 View Cardiomegaly without overt pulmonary edema.  No evidence of pneumonia, pneumothorax, pleural effusions [PC]  0100 CBC No leukocytosis that would be concerning for serious infection.  No anemia that would contribute to the shortness of breath.  [PC]  0110 Basic metabolic panel(!) No significant electrolyte derangements.  Mild hyperglycemia without DKA.  Mild renal insufficiency and AKI -compared to BMP from 5 months ago.  [PC]  0120 D-dimer, quantitative Negative ruling out pulmonary embolism.  Doubt aortic dissection.  [PC]  0140 Troponin T, High Sensitivity(!) Elevated at 45. [PC]  0141 Pro  Brain Natriuretic Peptide(!): 469.0 Not overtly concerning for acute CHF exacerbation based on parameters.  [PC]  0249 Resp panel by RT-PCR (RSV, Flu A&B, Covid) Anterior Nasal Swab Ordered to assess for COVID, influenza, RSV is negative.  [PC]  0450 Troponin T High Sensitivity(!): 46 Stable  [PC]  0543 Spoke with cardiology who recommended repeating ECHO to assess for wall motion changes and CHF exacerbation.  Given the AKI and need for ECHO, patient will be admitted to medicine for further work up.  Spoke with Dr. Lorren who admitted.  [PC]    Clinical Course User Index [PC] Rivan Siordia, Raynell Moder, MD    Medical Decision Making Amount and/or Complexity of Data Reviewed Labs: ordered. Decision-making details documented in ED Course. Radiology: ordered and independent interpretation performed. Decision-making details documented in ED Course. ECG/medicine tests: ordered and independent interpretation performed. Decision-making details documented in ED Course.  Risk Prescription drug management. Decision regarding hospitalization.     Final Clinical Impression(s) / ED Diagnoses Final diagnoses:  DOE (dyspnea on exertion)  AKI (acute kidney injury)     Past Medical History Past Medical History:  Diagnosis Date   Anemia    Arthritis    CHF (congestive heart failure) (HCC)    Colon polyps    COVID 2020   hospitalized at [facility]   Fibroids    GERD (gastroesophageal reflux disease)    Hypertension    Lupus (systemic lupus erythematosus) (HCC)    Myocardial infarction (HCC)    Pneumonia    Renal insufficiency    CKD stage 5-  not on dialysis (07/08/21)   Patient Active Problem List   Diagnosis Date Noted   Acute CVA (cerebrovascular accident) (HCC) 10/22/2023   ESRD on dialysis [facility])    History of colonic polyps    Polyp of hepatic flexure of colon    Adenomatous polyp of transverse colon    Polyp of cecum    Elevated troponin level not due myocardial  infarction 03/04/2021   Mixed hyperlipidemia 03/04/2021   Coronary artery disease involving native coronary artery of native heart 03/04/2021   AKI (acute kidney injury) 12/30/2019   Colitis    Hematochezia 01/11/2017   Fatigue 09/17/2014   Systolic CHF (HCC) 10/02/2012   CKD (chronic kidney disease) stage 5, GFR less than 15 ml/min (HCC) 04/22/2011   Pneumonia of left lower lobe due to infectious organism 04/20/2011   Essential hypertension 04/17/2011   Gout 04/17/2011   Lupus (systemic lupus erythematosus) (HCC) 04/17/2011   Home Medication(s) Prior to Admission medications   Medication Sig Start Date End Date Taking? Authorizing Provider  acetaminophen  (TYLENOL ) 325 MG tablet Take 650 mg by mouth every 6 (six) hours as needed for moderate pain.    [provider]  albuterol  (VENTOLIN  HFA) 108 (90 Base) MCG/ACT inhaler Inhale 2 puffs into the lungs every 6 (six) hours as needed for wheezing or shortness of breath. 01/04/20   Rai, Ripudeep K, MD  allopurinol  (ZYLOPRIM ) 100 MG tablet TAKE 1 TABLET EVERY DAY 10/10/15   Tish Alm DEL, MD  DILT-XR 180 MG 24 hr capsule Take 180 mg by mouth daily. 09/27/20   [provider]  doxazosin  (CARDURA ) 2 MG tablet Take 2 mg by mouth 2 (two) times daily. 11/06/20   [provider]  ELIQUIS  5 MG TABS tablet TAKE 1 TABLET(5 MG) BY MOUTH TWICE DAILY 03/13/24   Jeffrie Oneil BROCKS, MD  furosemide  (LASIX ) 80 MG tablet Take 80 mg by mouth daily as needed (swelling).    [provider]  hydrALAZINE  (APRESOLINE ) 50 MG tablet TAKE 1 TABLET THREE TIMES DAILY 10/10/15   Tish Alm DEL, MD  Menthol, Topical Analgesic, (BIOFREEZE) 4 % GEL Apply 1 application. topically 3 (three) times daily as needed (pain).    [provider]  mycophenolate  (MYFORTIC ) 180 MG EC tablet Take 360 mg by mouth 2 (two) times daily.    [provider]  pantoprazole  (PROTONIX ) 40 MG tablet Take 40 mg by mouth See admin instructions. Take one  tablet by mouth on Monday, Wednesdays and Fridays only per patient    [provider]  predniSONE  (DELTASONE ) 5 MG tablet Take 5 mg by mouth daily. 08/25/23   [provider]  rosuvastatin  (CRESTOR ) 40 MG tablet Take 1 tablet (40 mg total) by mouth at bedtime. 10/23/23 11/29/23  Perri DELENA Meliton Mickey., MD  Semaglutide,0.25 or 0.5MG /DOS, 2 MG/3ML SOPN Inject 0.25 mg into the skin once a week. 07/28/23   [provider]  sulfamethoxazole -trimethoprim  (BACTRIM ) 400-80 MG tablet Take 1 tablet by mouth See admin instructions. Take one tablet by mouth on Monday, Wednesdays and Fridays only per patient    [provider]  tacrolimus  (PROGRAF ) 1 MG capsule Take 2 mg by mouth 2 (two) times daily.    [provider]  Allergies Zithromax  [azithromycin ]  Review of Systems Review of Systems As noted in HPI  Physical Exam Vital Signs  I have reviewed the triage vital signs BP (!) 145/55   Pulse 79   Temp 98.1 F (36.7 C) (Oral)   Resp 20   Ht 5' 5 (1.651 m)   Wt 94 kg   SpO2 97%   BMI 34.49 kg/m   Physical Exam Vitals reviewed.  Constitutional:      General: She is not in acute distress.    Appearance: She is well-developed. She is not diaphoretic.  HENT:     Head: Normocephalic and atraumatic.     Nose: Nose normal.  Eyes:     General: No scleral icterus.       Right eye: No discharge.        Left eye: No discharge.     Conjunctiva/sclera: Conjunctivae normal.     Pupils: Pupils are equal, round, and reactive to light.  Cardiovascular:     Rate and Rhythm: Normal rate and regular rhythm.     Heart sounds: No murmur heard.    No friction rub. No gallop.  Pulmonary:     Effort: Pulmonary effort is normal. No respiratory distress.     Breath sounds: Normal breath sounds. No stridor. No rales.  Abdominal:      General: There is no distension.     Palpations: Abdomen is soft.     Tenderness: There is no abdominal tenderness.  Musculoskeletal:        General: No tenderness.     Cervical back: Normal range of motion and neck supple.     Right lower leg: No edema.     Left lower leg: No edema.  Skin:    General: Skin is warm and dry.     Findings: No erythema or rash.  Neurological:     Mental Status: She is alert and oriented to person, place, and time.     ED Results and Treatments Labs (all labs ordered are listed, but only abnormal results are displayed) Labs Reviewed  BASIC METABOLIC PANEL WITH GFR - Abnormal; Notable for the following components:      Result Value   CO2 17 (*)    Glucose, Bld 104 (*)    Creatinine, Ser 1.84 (*)    GFR, Estimated 29 (*)    All other components within normal limits  HEPATIC FUNCTION PANEL - Abnormal; Notable for the following components:   Total Protein 6.3 (*)    Indirect Bilirubin 0.2 (*)    All other components within normal limits  PRO BRAIN NATRIURETIC PEPTIDE - Abnormal; Notable for the following components:   Pro Brain Natriuretic Peptide 469.0 (*)    All other components within normal limits  TROPONIN T, HIGH SENSITIVITY - Abnormal; Notable for the following components:   Troponin T High Sensitivity 45 (*)    All other components within normal limits  TROPONIN T, HIGH SENSITIVITY - Abnormal; Notable for the following components:   Troponin T High Sensitivity 46 (*)    All other components within normal limits  RESP PANEL BY RT-PCR (RSV, FLU A&B, COVID)  [id number]  CBC  D-DIMER, QUANTITATIVE  TROPONIN T, HIGH SENSITIVITY  EKG  EKG Interpretation Date/Time:  Friday April 14 2024 23:41:58 EST Ventricular Rate:  81 PR Interval:  167 QRS Duration:  102 QT Interval:  397 QTC Calculation: 461 R Axis:   -41  Text  Interpretation: Sinus rhythm Left anterior fascicular block Probable left ventricular hypertrophy Anterior Q waves, possibly due to LVH Abnormal T, consider ischemia, lateral leads ST elevation, consider inferior injury No significant change was found from prior Confirmed by Trine Likes [phone]) on 04/14/2024 11:57:37 PM       Radiology DG Chest 2 View Result Date: 04/15/2024 EXAM: 2 VIEW(S) XRAY OF THE CHEST 04/15/2024 12:29:00 AM COMPARISON: 03/03/2021 CLINICAL HISTORY: SOB FINDINGS: LUNGS AND PLEURA: No focal pulmonary opacity. No pleural effusion. No pneumothorax. HEART AND MEDIASTINUM: Cardiomegaly. BONES AND SOFT TISSUES: No acute osseous abnormality. IMPRESSION: 1. Cardiomegaly. 2. No acute cardiopulmonary disease. Electronically signed by: Franky Crease MD 04/15/2024 12:41 AM EST RP Workstation: [id number]    Medications Ordered in ED Medications  ipratropium-albuterol  (DUONEB) 0.5-2.5 (3) MG/3ML nebulizer solution 3 mL (3 mLs Nebulization Given 04/15/24 9385)   Procedures Procedures  (including critical care time)  This chart was dictated using voice recognition software.  Despite best efforts to proofread,  errors can occur which can change the documentation meaning.   Trine Likes Moder, MD 04/15/24 [phone]

## 2024-04-15 NOTE — Assessment & Plan Note (Addendum)
 73 year old female presenting to ED with complains of shortness of breath and fatigue/weakness with pain in her shoulders  found to have elevated troponin in setting of known CAD s/p PCI in 2012 and diastolic CHF  -obs to tele -pain atypical, not substernal and more in her back. Main complaint is fatigue/weakness -check echo -continue eliquis ,  and high intensity statin (crestor  increased to 40mg  in 11/2023 after acute CVA). No longer on Toprol .  -cardiology consulted by EDP  -CXR with no acute findings, d-dimer wnl  -trending troponin and flat and down trending: 45>46>41  -w/u for weakness, see #3

## 2024-04-15 NOTE — Assessment & Plan Note (Signed)
Continue crestor 40mg daily. 

## 2024-04-15 NOTE — Assessment & Plan Note (Signed)
-  10/2023: MRI with 2 small infarcts within the right cingulate gyrus, 2 mm acute infarct within mid to posterior L frontal lobe white matter. Suspect cardioembolic in setting of PAF  -continue eliquis chrystie

## 2024-04-15 NOTE — Assessment & Plan Note (Addendum)
 In NSR today  Eliquis  5 mg p.o. twice daily  Continue cardizem  180mg .  No longer on Toprol  XL

## 2024-04-16 DIAGNOSIS — M25519 Pain in unspecified shoulder: Secondary | ICD-10-CM

## 2024-04-16 DIAGNOSIS — R0609 Other forms of dyspnea: Secondary | ICD-10-CM

## 2024-04-16 DIAGNOSIS — R7989 Other specified abnormal findings of blood chemistry: Secondary | ICD-10-CM | POA: Diagnosis not present

## 2024-04-16 LAB — CBC
HCT: 37.8 % (ref 36.0–46.0)
Hemoglobin: 11.2 g/dL — ABNORMAL LOW (ref 12.0–15.0)
MCH: 27.5 pg (ref 26.0–34.0)
MCHC: 29.6 g/dL — ABNORMAL LOW (ref 30.0–36.0)
MCV: 92.6 fL (ref 80.0–100.0)
Platelets: 233 K/uL (ref 150–400)
RBC: 4.08 MIL/uL (ref 3.87–5.11)
RDW: 15.4 % (ref 11.5–15.5)
WBC: 5.8 K/uL (ref 4.0–10.5)
nRBC: 0 % (ref 0.0–0.2)

## 2024-04-16 LAB — BASIC METABOLIC PANEL WITH GFR
Anion gap: 10 (ref 5–15)
BUN: 15 mg/dL (ref 8–23)
CO2: 19 mmol/L — ABNORMAL LOW (ref 22–32)
Calcium: 9.3 mg/dL (ref 8.9–10.3)
Chloride: 109 mmol/L (ref 98–111)
Creatinine, Ser: 1.59 mg/dL — ABNORMAL HIGH (ref 0.44–1.00)
GFR, Estimated: 34 mL/min — ABNORMAL LOW (ref >60)
Glucose, Bld: 100 mg/dL — ABNORMAL HIGH (ref 70–99)
Potassium: 4.1 mmol/L (ref 3.5–5.1)
Sodium: 138 mmol/L (ref 135–145)

## 2024-04-16 MED ORDER — VITAMIN D (ERGOCALCIFEROL) 1.25 MG (50000 UNIT) PO CAPS
50000.0000 [IU] | ORAL_CAPSULE | ORAL | Status: DC
  Administered 2024-04-16: 50000 [IU] via ORAL
  Filled 2024-04-16: qty 1

## 2024-04-16 MED ORDER — DOXAZOSIN MESYLATE 2 MG PO TABS: 2.0000 mg | ORAL_TABLET | Freq: Every day | ORAL | Status: DC

## 2024-04-16 NOTE — Progress Notes (Signed)
 Patient received discharge orders to go home. Patient was given discharge paperwork/instructions. RN went over discharge paperwork/instructions with the patient. Any questions/concerns were addressed/answered during this time to the best of RN's ability. Patient left the hospital stable, had discharge paperwork/instructions, and had all personal belongings.

## 2024-04-16 NOTE — Progress Notes (Signed)
 PT Cancellation Note  Patient Details Name: Brittany Charles MRN: [id number] DOB: 05-Jun-1950   Cancelled Treatment:    Reason Eval/Treat Not Completed: PT screened, no needs identified, will sign off  Pt reports she has been amb to bathroom/ad lib in room and feels she is at her baseline/independent. No further needs. Pt does not feel she needs PT/OT at this time.    [name], PT  Acute Rehab Dept [facility]) [phone]  04/16/2024  TRUDY [name] 04/16/2024, 12:11 PM

## 2024-04-16 NOTE — Plan of Care (Signed)

## 2024-04-16 NOTE — Care Management Obs Status (Signed)
 MEDICARE OBSERVATION STATUS NOTIFICATION   Patient Details  Name: KALIS FRIESE MRN: [id number] Date of Birth: March 23, 1951   Medicare Observation Status Notification Given:  Yes    Alex Leahy I Eben, LCSW 04/16/2024, 1:46 PM

## 2024-04-16 NOTE — Progress Notes (Signed)
 OT Cancellation Note  Patient Details Name: Brittany Charles MRN: [id number] DOB: 06-29-1950   Cancelled Treatment:    Reason Eval/Treat Not Completed: OT screened, no needs identified, will sign off, per PT, Pt states she is ind and has no PT/OT needs, signing off.   Brittany Charles 04/16/2024, 12:17 PM

## 2024-04-16 NOTE — Discharge Summary (Addendum)
 Physician Discharge Summary   Patient: Brittany Charles MRN: [id number] DOB: 1950/08/14  Admit date:     04/14/2024  Discharge date: 04/16/24  Discharge Physician: Owen DELENA Lore   PCP: Levora Reyes SAUNDERS, MD   Recommendations at discharge:   Follow up with DR Skain for consideration for Stress test.  Follow up renal function.  Adjust BP medication as needed.   Discharge Diagnoses: Principal Problem:   atypical chest pain with elevated troponin Active Problems:   Acute renal failure superimposed on stage 3b chronic kidney disease (HCC)   Weakness   Essential hypertension   Paroxysmal atrial fibrillation (HCC)   History of CVA (cerebrovascular accident)   Chronic diastolic CHF (congestive heart failure) (HCC)   Mixed hyperlipidemia   Deceased-donor kidney transplant recipient   Gout   Coronary artery disease involving native coronary artery of native heart  Resolved Problems:   * No resolved hospital problems. [facility] Course: 73 year old with past medical history significant for CAD status post PCI 2012, hypertension, diastolic dysfunction, paroxysmal A-fib on Eliquis , SVT, gout, lupus, history of end-stage renal disease s/p renal transplant, history of CVA presented with 3 days of shortness of breath, poor oral intake, weakness and shoulder pain.   She presented with shortness of breath on exertion, shoulder pain.  Evaluation in the ED creatinine 1.8 baseline creatinine 1.2--1.4, troponin 45--- 46.  Cardiology consulted.      Assessment and Plan: 1-Atypical chest pain, shortness of breath elevated troponin - Presented with shoulder pain and shortness of breath on exertion. - Chest x-ray no acute finding, D-dimer normal.  - Mild elevation troponin and flat. -Evaluated by cardiology no further plan for inpatient evaluation. -Patient reports that chest pain shoulder pain has resolved -ECHO: normal EF, Normal Wall motion  Acute renal failure on CKD 3B - Presented  with a creatinine of 1.8, previous creatinine baseline 1.4 -Suspect related to dehydration.  -Creatinine 1.5. She will need close outpatient follow-up  Weakness: - Vitamin D  low at 28, will give one-time dose supplementation.  She will need to follow-up with urology transplant team.  Her vitamin D  supplement was discontinued by them  -Orthostatic negative.  Resolved.   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 Renal function improved down to 1.5 previous creatinine 1.4.  She will need close outpatient follow-up  Mixed hyperlipidemia - Continues to statins   Gout -continue allopurinol .           Consultants: Cardiology  Procedures performed: ECHO Disposition: Home Diet recommendation:  Discharge Diet Orders (From admission, onward)     Start     Ordered   04/16/24 0000  Diet - low sodium heart healthy        04/16/24 1100           Cardiac diet DISCHARGE MEDICATION: Allergies as of 04/16/2024       Reactions   Zithromax  [azithromycin ] Nausea And Vomiting        Medication List     STOP taking these medications    Semaglutide(0.25 or 0.5MG /DOS) 2 MG/3ML Sopn       TAKE these medications    acetaminophen  325 MG tablet Commonly known as: TYLENOL  Take 650  mg by mouth daily as needed for moderate pain (pain score 4-6), mild pain (pain score 1-3) or headache.   albuterol  108 (90 Base) MCG/ACT inhaler Commonly known as: VENTOLIN  HFA Inhale 2 puffs into the lungs every 6 (six) hours as needed for wheezing or shortness of breath.   allopurinol  100 MG tablet Commonly known as: ZYLOPRIM  TAKE 1 TABLET  EVERY DAY   Biofreeze 4 % Gel Generic drug: Menthol (Topical Analgesic) Apply 1 application. topically 3 (three) times daily as needed (pain).   Dilt-XR 180 MG 24 hr capsule Generic drug: diltiazem  Take 180 mg by mouth daily.   doxazosin  2 MG tablet Commonly known as: CARDURA  Take 2 mg by mouth at bedtime.   Eliquis  5 MG Tabs tablet Generic drug: apixaban  TAKE 1 TABLET(5 MG) BY MOUTH TWICE DAILY   furosemide  80 MG tablet Commonly known as: LASIX  Take 80 mg by mouth daily as needed (swelling).   hydrALAZINE  50 MG tablet Commonly known as: APRESOLINE  TAKE 1 TABLET THREE TIMES DAILY   mycophenolate  180 MG EC tablet Commonly known as: MYFORTIC  Take 180 mg by mouth 2 (two) times daily.   pantoprazole  40 MG tablet Commonly known as: PROTONIX  Take 40 mg by mouth every Monday, Wednesday, and Friday.   predniSONE  5 MG tablet Commonly known as: DELTASONE  Take 5 mg by mouth daily.   rosuvastatin  40 MG tablet Commonly known as: CRESTOR  Take 1 tablet (40 mg total) by mouth at bedtime.   sulfamethoxazole -trimethoprim  400-80 MG tablet Commonly known as: BACTRIM  Take 1 tablet by mouth every Monday, Wednesday, and Friday.   tacrolimus  1 MG capsule Commonly known as: PROGRAF  Take 2 mg by mouth 2 (two) times daily.        Discharge Exam: Filed Weights   04/14/24 2337 04/16/24 0500  Weight: 94 kg 92.8 kg   General; NAD  Condition at discharge: stable  The results of significant diagnostics from this hospitalization (including imaging, microbiology, ancillary and laboratory) are listed below for reference.   Imaging Studies: ECHOCARDIOGRAM COMPLETE Result Date: 04/15/2024    ECHOCARDIOGRAM REPORT   Patient Name:   Brittany Charles Date of Exam: 04/15/2024 Medical Rec #:  [id number]          Height:       65.0 in Accession #:    [id number]         Weight:       207.2 lb Date of Birth:  1951/02/20          BSA:          2.008 m Patient Age:    73 years           BP:            145/55 mmHg Patient Gender: F                  HR:           80 bpm. Exam Location:  Inpatient Procedure: 2D Echo, Color Doppler and Cardiac Doppler (Both Spectral and Color            Flow Doppler were utilized during procedure). Indications:    Dyspnea R06.00  History:        Patient has prior history of Echocardiogram examinations, most                 recent 10/23/2023. CHF; Previous Myocardial Infarction.  Sonographer:    Tinnie Gosling RDCS Referring Phys: [id number] PEDRO EDUARDO CARDAMA IMPRESSIONS  1.  Left ventricular ejection fraction, by estimation, is 60 to 65%. The left ventricle has normal function. The left ventricle has no regional wall motion abnormalities. Left ventricular diastolic parameters are consistent with Grade II diastolic dysfunction (pseudonormalization). Elevated left atrial pressure.  2. Right ventricular systolic function is normal. The right ventricular size is normal. Tricuspid regurgitation signal is inadequate for assessing PA pressure.  3. Left atrial size was mildly dilated.  4. The mitral valve is normal in structure. Mild mitral valve regurgitation. No evidence of mitral stenosis.  5. The aortic valve is normal in structure. Aortic valve regurgitation is not visualized. No aortic stenosis is present.  6. The inferior vena cava is normal in size with greater than 50% respiratory variability, suggesting right atrial pressure of 3 mmHg. FINDINGS  Left Ventricle: Left ventricular ejection fraction, by estimation, is 60 to 65%. The left ventricle has normal function. The left ventricle has no regional wall motion abnormalities. The left ventricular internal cavity size was normal in size. There is  no left ventricular hypertrophy. Left ventricular diastolic parameters are consistent with Grade II diastolic dysfunction (pseudonormalization). Elevated left atrial pressure. Right Ventricle: The right ventricular size is normal. No increase in right ventricular wall thickness. Right  ventricular systolic function is normal. Tricuspid regurgitation signal is inadequate for assessing PA pressure. Left Atrium: Left atrial size was mildly dilated. Right Atrium: Right atrial size was normal in size. Pericardium: There is no evidence of pericardial effusion. Mitral Valve: The mitral valve is normal in structure. Mild mitral valve regurgitation. No evidence of mitral valve stenosis. Tricuspid Valve: The tricuspid valve is normal in structure. Tricuspid valve regurgitation is trivial. No evidence of tricuspid stenosis. Aortic Valve: The aortic valve is normal in structure. Aortic valve regurgitation is not visualized. No aortic stenosis is present. Pulmonic Valve: The pulmonic valve was normal in structure. Pulmonic valve regurgitation is trivial. No evidence of pulmonic stenosis. Aorta: The aortic root is normal in size and structure. Venous: The inferior vena cava is normal in size with greater than 50% respiratory variability, suggesting right atrial pressure of 3 mmHg. IAS/Shunts: No atrial level shunt detected by color flow Doppler.  LEFT VENTRICLE PLAX 2D LVIDd:         4.80 cm      Diastology LVIDs:         2.90 cm      LV e' medial:    6.09 cm/s LV PW:         1.00 cm      LV E/e' medial:  23.2 LV IVS:        1.00 cm      LV e' lateral:   8.81 cm/s LVOT diam:     1.90 cm      LV E/e' lateral: 16.0 LV SV:         66 LV SV Index:   33 LVOT Area:     2.84 cm LV IVRT:       63 msec  LV Volumes (MOD) LV vol d, MOD A2C: 153.0 ml LV vol d, MOD A4C: 123.0 ml LV vol s, MOD A2C: 79.0 ml LV vol s, MOD A4C: 46.0 ml LV SV MOD A2C:     74.0 ml LV SV MOD A4C:     123.0 ml LV SV MOD BP:      76.4 ml RIGHT VENTRICLE             IVC RV S prime:     15.30 cm/s  IVC diam: 1.90 cm TAPSE (M-mode): 3.2 cm                             PULMONARY VEINS                             Systolic Velocity: 54.80 cm/s LEFT ATRIUM             Index        RIGHT ATRIUM           Index LA diam:        4.90 cm 2.44 cm/m   RA Area:      18.40 cm LA Vol (A2C):   68.7 ml 34.21 ml/m  RA Volume:   46.60 ml  23.20 ml/m LA Vol (A4C):   71.9 ml 35.80 ml/m LA Biplane Vol: 73.6 ml 36.65 ml/m  AORTIC VALVE LVOT Vmax:   113.00 cm/s LVOT Vmean:  88.600 cm/s LVOT VTI:    0.233 m  AORTA Ao Root diam: 2.70 cm Ao Asc diam:  2.90 cm MITRAL VALVE MV Area (PHT): 3.61 cm     SHUNTS MV E velocity: 141.00 cm/s  Systemic VTI:  0.23 m MV A velocity: 133.00 cm/s  Systemic Diam: 1.90 cm MV E/A ratio:  1.06 Wilbert Bihari MD Electronically signed by Wilbert Bihari MD Signature Date/Time: 04/15/2024/5:00:31 PM    Final    DG Chest 2 View Result Date: 04/15/2024 EXAM: 2 VIEW(S) XRAY OF THE CHEST 04/15/2024 12:29:00 AM COMPARISON: 03/03/2021 CLINICAL HISTORY: SOB FINDINGS: LUNGS AND PLEURA: No focal pulmonary opacity. No pleural effusion. No pneumothorax. HEART AND MEDIASTINUM: Cardiomegaly. BONES AND SOFT TISSUES: No acute osseous abnormality. IMPRESSION: 1. Cardiomegaly. 2. No acute cardiopulmonary disease. Electronically signed by: Franky Crease MD 04/15/2024 12:41 AM EST RP Workstation: [id number]    Microbiology: Results for orders placed or performed during the hospital encounter of 04/14/24  Resp panel by RT-PCR (RSV, Flu A&B, Covid) Anterior Nasal Swab     Status: None   Collection Time: 04/14/24 11:47 PM   Specimen: Anterior Nasal Swab  Result Value Ref Range Status   SARS Coronavirus 2 by RT PCR NEGATIVE NEGATIVE Final    Comment: (NOTE) SARS-CoV-2 target nucleic acids are NOT DETECTED.  The SARS-CoV-2 RNA is generally detectable in upper respiratory specimens during the acute phase of infection. The lowest concentration of SARS-CoV-2 viral copies this assay can detect is 138 copies/mL. A negative result does not preclude SARS-Cov-2 infection and should not be used as the sole basis for treatment or other patient management decisions. A negative result may occur with  improper specimen collection/handling, submission of specimen other than  nasopharyngeal swab, presence of viral mutation(s) within the areas targeted by this assay, and inadequate number of viral copies(<138 copies/mL). A negative result must be combined with clinical observations, patient history, and epidemiological information. The expected result is Negative.  Fact Sheet for Patients:  bloggercourse.com  Fact Sheet for Healthcare Providers:  seriousbroker.it  This test is no t yet approved or cleared by the [place]  FDA and  has been authorized for detection and/or diagnosis of SARS-CoV-2 by FDA under an Emergency Use Authorization (EUA). This EUA will remain  in effect (meaning this test can be used) for the duration of the COVID-19 declaration under Section 564(b)(1) of the Act, 21 U.S.C.section 360bbb-3(b)(1), unless the authorization is terminated  or revoked sooner.  Influenza A by PCR NEGATIVE NEGATIVE Final   Influenza B by PCR NEGATIVE NEGATIVE Final    Comment: (NOTE) The Xpert Xpress SARS-CoV-2/FLU/RSV plus assay is intended as an aid in the diagnosis of influenza from Nasopharyngeal swab specimens and should not be used as a sole basis for treatment. Nasal washings and aspirates are unacceptable for Xpert Xpress SARS-CoV-2/FLU/RSV testing.  Fact Sheet for Patients: bloggercourse.com  Fact Sheet for Healthcare Providers: seriousbroker.it  This test is not yet approved or cleared by the [place]  FDA and has been authorized for detection and/or diagnosis of SARS-CoV-2 by FDA under an Emergency Use Authorization (EUA). This EUA will remain in effect (meaning this test can be used) for the duration of the COVID-19 declaration under Section 564(b)(1) of the Act, 21 U.S.C. section 360bbb-3(b)(1), unless the authorization is terminated or revoked.     Resp Syncytial Virus by PCR NEGATIVE NEGATIVE Final    Comment:  (NOTE) Fact Sheet for Patients: bloggercourse.com  Fact Sheet for Healthcare Providers: seriousbroker.it  This test is not yet approved or cleared by the [place]  FDA and has been authorized for detection and/or diagnosis of SARS-CoV-2 by FDA under an Emergency Use Authorization (EUA). This EUA will remain in effect (meaning this test can be used) for the duration of the COVID-19 declaration under Section 564(b)(1) of the Act, 21 U.S.C. section 360bbb-3(b)(1), unless the authorization is terminated or revoked.  Performed at [facility], [address], [place], [place] [address]     Labs: CBC: Recent Labs  Lab 04/14/24 2347 04/16/24 0536  WBC 5.8 5.8  HGB 12.3 11.2*  HCT 40.3 37.8  MCV 91.2 92.6  PLT 240 233   Basic Metabolic Panel: Recent Labs  Lab 04/14/24 2347 04/15/24 0925 04/16/24 0536  NA 140 141 138  K 4.4 4.0 4.1  CL 110 110 109  CO2 17* 19* 19*  GLUCOSE 104* 99 100*  BUN 17 16 15   CREATININE 1.84* 1.75* 1.59*  CALCIUM  9.4 9.7 9.3  MG  --  2.0  --   PHOS  --  3.7  --    Liver Function Tests: Recent Labs  Lab 04/15/24 0133  AST 19  ALT 10  ALKPHOS 85  BILITOT 0.4  PROT 6.3*  ALBUMIN  3.7   CBG: No results for input(s): GLUCAP in the last 168 hours.  Discharge time spent: greater than 30 minutes.  Signed: Owen DELENA Lore, MD [facility] 04/16/2024

## 2024-04-16 NOTE — Consult Note (Signed)
 Cardiology Consultation   Patient ID: Brittany Charles MRN: [id number]; DOB: 1950-11-15  Admit date: 04/14/2024 Date of Consult: 04/16/2024  PCP:  Brittany Reyes SAUNDERS, MD   [facility] Providers Cardiologist:  Brittany Parchment, MD   {    Patient Profile: Brittany Charles is a 73 y.o. female with a hx of CAD, s/p remote PCI who is being seen 04/16/2024 for the evaluation of shoulder pain at the request of Dr. Madelyne.  History of Present Illness: Ms. Brittany Charles presented to the hospital with fatigue and weakness for a couple of days. She also notes that when she walks she will get soreness in her shoulder that resolves with rest. This was not what she experienced 12 years ago when she presented with Sob and underwent PCI. She denies other symptoms and feels better.    Past Medical History:  Diagnosis Date   Anemia    Arthritis    CHF (congestive heart failure) (HCC)    Colon polyps    COVID 2020   hospitalized at [facility]   Fibroids    GERD (gastroesophageal reflux disease)    Hypertension    Lupus (systemic lupus erythematosus) (HCC)    Myocardial infarction (HCC)    Pneumonia    Renal insufficiency    CKD stage 5- not on dialysis (07/08/21)    Past Surgical History:  Procedure Laterality Date   ABDOMINAL HYSTERECTOMY  10/27/2000   TAH, BSO   AV FISTULA PLACEMENT Left 05/27/2021   Procedure: LEFT BRACHIOCEPHALIC ARTERIOVENOUS (AV) FISTULA CREATION;  Surgeon: Brittany Fonda BRAVO, MD;  Location: [facility] OR;  Service: Vascular;  Laterality: Left;  with peripheral nerve block   BIOPSY  09/02/2021   Procedure: BIOPSY;  Surgeon: Brittany Gustav GAILS, MD;  Location: [facility] ENDOSCOPY;  Service: Endoscopy;;   COLONOSCOPY WITH PROPOFOL  N/A 09/02/2021   Procedure: COLONOSCOPY WITH PROPOFOL ;  Surgeon: Brittany Gustav GAILS, MD;  Location: [facility];  Service: Endoscopy;  Laterality: N/A;   LEFT AND RIGHT HEART CATHETERIZATION WITH CORONARY ANGIOGRAM Right 04/21/2011   Procedure: LEFT  AND RIGHT HEART CATHETERIZATION WITH CORONARY ANGIOGRAM;  Surgeon: Brittany Fell, MD;  Location: [facility] CATH LAB;  Service: Cardiovascular;  Laterality: Right;   LIGATION OF ARTERIOVENOUS  FISTULA Left 07/10/2021   Procedure: LEFT ARM BRANCH LIGATION OF ARTERIOVENOUS  FISTULA;  Surgeon: Brittany Fonda BRAVO, MD;  Location: [facility] OR;  Service: Vascular;  Laterality: Left;  PERIPHERAL NERVE BLOCK   POLYPECTOMY  09/02/2021   Procedure: POLYPECTOMY;  Surgeon: Brittany Gustav GAILS, MD;  Location: [facility] ENDOSCOPY;  Service: Endoscopy;;   TONSILLECTOMY       Home Medications:  Prior to Admission medications   Medication Sig Start Date End Date Taking? Authorizing Provider  acetaminophen  (TYLENOL ) 325 MG tablet Take 650 mg by mouth daily as needed for moderate pain (pain score 4-6), mild pain (pain score 1-3) or headache.   Yes [provider]  albuterol  (VENTOLIN  HFA) 108 (90 Base) MCG/ACT inhaler Inhale 2 puffs into the lungs every 6 (six) hours as needed for wheezing or shortness of breath. 01/04/20  Yes Rai, Ripudeep K, MD  allopurinol  (ZYLOPRIM ) 100 MG tablet TAKE 1 TABLET EVERY DAY 10/10/15  Yes Brittany Alm DEL, MD  DILT-XR 180 MG 24 hr capsule Take 180 mg by mouth daily. 09/27/20  Yes [provider]  doxazosin  (CARDURA ) 2 MG tablet Take 2 mg by mouth at bedtime. 11/06/20  Yes [provider]  ELIQUIS  5 MG TABS tablet TAKE 1 TABLET(5 MG) BY  MOUTH TWICE DAILY 03/13/24  Yes Brittany Brittany BROCKS, MD  furosemide  (LASIX ) 80 MG tablet Take 80 mg by mouth daily as needed (swelling).   Yes [provider]  hydrALAZINE  (APRESOLINE ) 50 MG tablet TAKE 1 TABLET THREE TIMES DAILY 10/10/15  Yes Brittany Alm DEL, MD  Menthol, Topical Analgesic, (BIOFREEZE) 4 % GEL Apply 1 application. topically 3 (three) times daily as needed (pain).   Yes [provider]  mycophenolate  (MYFORTIC ) 180 MG EC tablet Take 180 mg by mouth 2 (two) times daily.   Yes [provider]  pantoprazole  (PROTONIX ) 40  MG tablet Take 40 mg by mouth every Monday, Wednesday, and Friday.   Yes [provider]  predniSONE  (DELTASONE ) 5 MG tablet Take 5 mg by mouth daily. 08/25/23  Yes [provider]  rosuvastatin  (CRESTOR ) 40 MG tablet Take 1 tablet (40 mg total) by mouth at bedtime. 10/23/23 04/15/24 Yes Brittany DELENA Meliton Mickey., MD  Semaglutide,0.25 or 0.5MG /DOS, 2 MG/3ML SOPN Inject 0.25 mg into the skin once a week. 07/28/23  Yes [provider]  sulfamethoxazole -trimethoprim  (BACTRIM ) 400-80 MG tablet Take 1 tablet by mouth every Monday, Wednesday, and Friday.   Yes [provider]  tacrolimus  (PROGRAF ) 1 MG capsule Take 2 mg by mouth 2 (two) times daily.   Yes [provider]    Scheduled Meds:  allopurinol   100 mg Oral Daily   apixaban   5 mg Oral BID   diltiazem   180 mg Oral Daily   hydrALAZINE   50 mg Oral TID   mycophenolate   180 mg Oral BID   pantoprazole   40 mg Oral Daily   predniSONE   5 mg Oral Daily   rosuvastatin   40 mg Oral QHS   [START ON 04/17/2024] sulfamethoxazole -trimethoprim   1 tablet Oral Once per day on Monday Wednesday Friday   tacrolimus   2 mg Oral BID   Continuous Infusions:  PRN Meds: acetaminophen  **OR** acetaminophen , hydrALAZINE , ondansetron  **OR** ondansetron  (ZOFRAN ) IV, mouth rinse  Allergies:    Allergies  Allergen Reactions   Zithromax  [Azithromycin ] Nausea And Vomiting    Social History:   Social History   Socioeconomic History   Marital status: Single    Spouse name: Not on file   Number of children: 1   Years of education: Not on file   Highest education level: Not on file  Occupational History   Occupation: retired fish farm manager  Tobacco Use   Smoking status: Never   Smokeless tobacco: Never  Vaping Use   Vaping status: Never Used  Substance and Sexual Activity   Alcohol use: No   Drug use: No   Sexual activity: Never  Other Topics Concern   Not on file  Social History Narrative   Not on file   Social Drivers  of Health   Financial Resource Strain: Low Risk  (10/07/2022)   Overall Financial Resource Strain (CARDIA)    Difficulty of Paying Living Expenses: Not hard at all  Food Insecurity: No Food Insecurity (04/15/2024)   Hunger Vital Sign    Worried About Running Out of Food in the Last Year: Never true    Ran Out of Food in the Last Year: Never true  Transportation Needs: No Transportation Needs (04/15/2024)   PRAPARE - Administrator, Civil Service (Medical): No    Lack of Transportation (Non-Medical): No  Physical Activity: Inactive (10/07/2022)   Exercise Vital Sign    Days of Exercise per Week: 0 days    Minutes of Exercise per  Session: 0 min  Stress: No Stress Concern Present (10/07/2022)   [employer] of Occupational Health - Occupational Stress Questionnaire    Feeling of Stress : Not at all  Social Connections: Moderately Isolated (04/15/2024)   Social Connection and Isolation Panel    Frequency of Communication with Friends and Family: More than three times a week    Frequency of Social Gatherings with Friends and Family: Once a week    Attends Religious Services: More than 4 times per year    Active Member of [employer] or Organizations: No    Attends Banker Meetings: Never    Marital Status: Separated  Intimate Partner Violence: Not At Risk (04/15/2024)   Humiliation, Afraid, Rape, and Kick questionnaire    Fear of Current or Ex-Partner: No    Emotionally Abused: No    Physically Abused: No    Sexually Abused: No    Family History:    Family History  Problem Relation Age of Onset   Hypertension Mother    Heart disease Mother        CHF   Stroke Mother    Diabetes Mother    Hyperlipidemia Father    Stroke Father    Hypertension Father    Asthma Brother    Kidney disease Brother    Stroke Maternal Grandmother    Colon cancer Other        several cousins   Breast cancer Other    Colon cancer Son    Hypertension Son      ROS:  Please see  the history of present illness.   All other ROS reviewed and negative.     Physical Exam/Data: Vitals:   04/16/24 0302 04/16/24 0500 04/16/24 0632 04/16/24 0844  BP: 137/61  (!) 137/58 (!) 142/66  Pulse:   78 78  Resp: (!) 22  16   Temp:   97.9 F (36.6 C)   TempSrc:      SpO2:   97%   Weight:  92.8 kg    Height:        Intake/Output Summary (Last 24 hours) at 04/16/2024 0937 Last data filed at 04/16/2024 0854 Gross per 24 hour  Intake 1417.4 ml  Output 400 ml  Net 1017.4 ml      04/16/2024    5:00 AM 04/14/2024   11:37 PM 11/29/2023    8:07 AM  Last 3 Weights  Weight (lbs) 204 lb 9.4 oz 207 lb 3.7 oz 206 lb  Weight (kg) 92.8 kg 94 kg 93.441 kg     Body mass index is 34.05 kg/m.  General:  Well nourished, well developed, in no acute distress HEENT: normal Neck: no JVD Vascular: No carotid bruits; Distal pulses 2+ bilaterally Cardiac:  normal S1, S2; RRR; no murmur  Lungs:  clear to auscultation bilaterally, no wheezing, rhonchi or rales  Abd: soft, nontender, no hepatomegaly  Ext: no edema Musculoskeletal:  No deformities, BUE and BLE strength normal and equal Skin: warm and dry  Neuro:  CNs 2-12 intact, no focal abnormalities noted Psych:  Normal affect   EKG:  The EKG was personally reviewed and demonstrates:  NSR with NSSTT changes Telemetry:  Telemetry was personally reviewed and demonstrates:  NSR  Relevant CV Studies: 2D echo with normal LV function  Laboratory Data: High Sensitivity Troponin:  No results for input(s): TROPONINIHS in the last 720 hours.   Chemistry Recent Labs  Lab 04/14/24 2347 04/15/24 0925 04/16/24 0536  NA 140 141 138  Charles 4.4 4.0 4.1  CL 110 110 109  CO2 17* 19* 19*  GLUCOSE 104* 99 100*  BUN 17 16 15   CREATININE 1.84* 1.75* 1.59*  CALCIUM  9.4 9.7 9.3  MG  --  2.0  --   GFRNONAA 29* 30* 34*  ANIONGAP 13 13 10     Recent Labs  Lab 04/15/24 0133  PROT 6.3*  ALBUMIN  3.7  AST 19  ALT 10  ALKPHOS 85  BILITOT 0.4    Lipids No results for input(s): CHOL, TRIG, HDL, LABVLDL, LDLCALC, CHOLHDL in the last 168 hours.  Hematology Recent Labs  Lab 04/14/24 2347 04/16/24 0536  WBC 5.8 5.8  RBC 4.42 4.08  HGB 12.3 11.2*  HCT 40.3 37.8  MCV 91.2 92.6  MCH 27.8 27.5  MCHC 30.5 29.6*  RDW 15.3 15.4  PLT 240 233   Thyroid   Recent Labs  Lab 04/15/24 0925  TSH 1.380    BNP Recent Labs  Lab 04/15/24 0133  PROBNP 469.0*    DDimer  Recent Labs  Lab 04/15/24 0042  DDIMER 0.38    Radiology/Studies:  ECHOCARDIOGRAM COMPLETE Result Date: 04/15/2024    ECHOCARDIOGRAM REPORT   Patient Name:   Brittany Charles Date of Exam: 04/15/2024 Medical Rec #:  [id number]          Height:       65.0 in Accession #:    [id number]         Weight:       207.2 lb Date of Birth:  1950-10-05          BSA:          2.008 m Patient Age:    73 years           BP:           145/55 mmHg Patient Gender: F                  HR:           80 bpm. Exam Location:  Inpatient Procedure: 2D Echo, Color Doppler and Cardiac Doppler (Both Spectral and Color            Flow Doppler were utilized during procedure). Indications:    Dyspnea R06.00  History:        Patient has prior history of Echocardiogram examinations, most                 recent 10/23/2023. CHF; Previous Myocardial Infarction.  Sonographer:    Tinnie Gosling RDCS Referring Phys: [id number] PEDRO EDUARDO CARDAMA IMPRESSIONS  1. Left ventricular ejection fraction, by estimation, is 60 to 65%. The left ventricle has normal function. The left ventricle has no regional wall motion abnormalities. Left ventricular diastolic parameters are consistent with Grade II diastolic dysfunction (pseudonormalization). Elevated left atrial pressure.  2. Right ventricular systolic function is normal. The right ventricular size is normal. Tricuspid regurgitation signal is inadequate for assessing PA pressure.  3. Left atrial size was mildly dilated.  4. The mitral valve is normal in structure.  Mild mitral valve regurgitation. No evidence of mitral stenosis.  5. The aortic valve is normal in structure. Aortic valve regurgitation is not visualized. No aortic stenosis is present.  6. The inferior vena cava is normal in size with greater than 50% respiratory variability, suggesting right atrial pressure of 3 mmHg. FINDINGS  Left Ventricle: Left ventricular ejection fraction, by estimation, is 60 to 65%. The left ventricle has normal function. The left ventricle  has no regional wall motion abnormalities. The left ventricular internal cavity size was normal in size. There is  no left ventricular hypertrophy. Left ventricular diastolic parameters are consistent with Grade II diastolic dysfunction (pseudonormalization). Elevated left atrial pressure. Right Ventricle: The right ventricular size is normal. No increase in right ventricular wall thickness. Right ventricular systolic function is normal. Tricuspid regurgitation signal is inadequate for assessing PA pressure. Left Atrium: Left atrial size was mildly dilated. Right Atrium: Right atrial size was normal in size. Pericardium: There is no evidence of pericardial effusion. Mitral Valve: The mitral valve is normal in structure. Mild mitral valve regurgitation. No evidence of mitral valve stenosis. Tricuspid Valve: The tricuspid valve is normal in structure. Tricuspid valve regurgitation is trivial. No evidence of tricuspid stenosis. Aortic Valve: The aortic valve is normal in structure. Aortic valve regurgitation is not visualized. No aortic stenosis is present. Pulmonic Valve: The pulmonic valve was normal in structure. Pulmonic valve regurgitation is trivial. No evidence of pulmonic stenosis. Aorta: The aortic root is normal in size and structure. Venous: The inferior vena cava is normal in size with greater than 50% respiratory variability, suggesting right atrial pressure of 3 mmHg. IAS/Shunts: No atrial level shunt detected by color flow Doppler.  LEFT  VENTRICLE PLAX 2D LVIDd:         4.80 cm      Diastology LVIDs:         2.90 cm      LV e' medial:    6.09 cm/s LV PW:         1.00 cm      LV E/e' medial:  23.2 LV IVS:        1.00 cm      LV e' lateral:   8.81 cm/s LVOT diam:     1.90 cm      LV E/e' lateral: 16.0 LV SV:         66 LV SV Index:   33 LVOT Area:     2.84 cm LV IVRT:       63 msec  LV Volumes (MOD) LV vol d, MOD A2C: 153.0 ml LV vol d, MOD A4C: 123.0 ml LV vol s, MOD A2C: 79.0 ml LV vol s, MOD A4C: 46.0 ml LV SV MOD A2C:     74.0 ml LV SV MOD A4C:     123.0 ml LV SV MOD BP:      76.4 ml RIGHT VENTRICLE             IVC RV S prime:     15.30 cm/s  IVC diam: 1.90 cm TAPSE (M-mode): 3.2 cm                             PULMONARY VEINS                             Systolic Velocity: 54.80 cm/s LEFT ATRIUM             Index        RIGHT ATRIUM           Index LA diam:        4.90 cm 2.44 cm/m   RA Area:     18.40 cm LA Vol (A2C):   68.7 ml 34.21 ml/m  RA Volume:   46.60 ml  23.20 ml/m LA Vol (A4C):   71.9 ml 35.80  ml/m LA Biplane Vol: 73.6 ml 36.65 ml/m  AORTIC VALVE LVOT Vmax:   113.00 cm/s LVOT Vmean:  88.600 cm/s LVOT VTI:    0.233 m  AORTA Ao Root diam: 2.70 cm Ao Asc diam:  2.90 cm MITRAL VALVE MV Area (PHT): 3.61 cm     SHUNTS MV E velocity: 141.00 cm/s  Systemic VTI:  0.23 m MV A velocity: 133.00 cm/s  Systemic Diam: 1.90 cm MV E/A ratio:  1.06 Wilbert Bihari MD Electronically signed by Wilbert Bihari MD Signature Date/Time: 04/15/2024/5:00:31 PM    Final    DG Chest 2 View Result Date: 04/15/2024 EXAM: 2 VIEW(S) XRAY OF THE CHEST 04/15/2024 12:29:00 AM COMPARISON: 03/03/2021 CLINICAL HISTORY: SOB FINDINGS: LUNGS AND PLEURA: No focal pulmonary opacity. No pleural effusion. No pneumothorax. HEART AND MEDIASTINUM: Cardiomegaly. BONES AND SOFT TISSUES: No acute osseous abnormality. IMPRESSION: 1. Cardiomegaly. 2. No acute cardiopulmonary disease. Electronically signed by: Franky Crease MD 04/15/2024 12:41 AM EST RP Workstation: [id number]      Assessment and Plan: Exertional shoulder pain - she has negative enzymes (a high sensitivity flat troponin of 45 is non-diagnostic) and her echo is normal. I would not work up further in the hospital. I would suggest she followup with Dr. Jeffrie and consider an outpatient stress test. I would switch to a beta blocker but I'll defer to Dr. Jeffrie. Please call for additional questions.   Signed, Danelle Birmingham, MD  04/16/2024 9:37 AM

## 2024-06-16 NOTE — Progress Notes (Incomplete)
{  ELLAMTemplates:[id number]}

## 2024-06-19 ENCOUNTER — Encounter: Admitting: Nurse Practitioner
